# Patient Record
Sex: Male | Born: 1965 | Race: Black or African American | Hispanic: No | Smoking: Current every day smoker
Health system: Southern US, Community
[De-identification: ages and names within clinical notes are randomized; demographics above are authoritative.]

## PROBLEM LIST (undated history)

## (undated) DIAGNOSIS — I2510 Atherosclerotic heart disease of native coronary artery without angina pectoris: Secondary | ICD-10-CM

## (undated) DIAGNOSIS — E119 Type 2 diabetes mellitus without complications: Secondary | ICD-10-CM

## (undated) DIAGNOSIS — E7800 Pure hypercholesterolemia, unspecified: Secondary | ICD-10-CM

## (undated) DIAGNOSIS — K219 Gastro-esophageal reflux disease without esophagitis: Secondary | ICD-10-CM

## (undated) DIAGNOSIS — G4730 Sleep apnea, unspecified: Secondary | ICD-10-CM

## (undated) DIAGNOSIS — I219 Acute myocardial infarction, unspecified: Secondary | ICD-10-CM

## (undated) DIAGNOSIS — F1010 Alcohol abuse, uncomplicated: Secondary | ICD-10-CM

## (undated) DIAGNOSIS — I10 Essential (primary) hypertension: Secondary | ICD-10-CM

## (undated) DIAGNOSIS — S060XAA Concussion with loss of consciousness status unknown, initial encounter: Secondary | ICD-10-CM

## (undated) DIAGNOSIS — S060X9A Concussion with loss of consciousness of unspecified duration, initial encounter: Secondary | ICD-10-CM

## (undated) DIAGNOSIS — Z720 Tobacco use: Secondary | ICD-10-CM

## (undated) HISTORY — PX: KNEE ARTHROSCOPY: SHX127

## (undated) HISTORY — PX: OTHER SURGICAL HISTORY: SHX169

---

## 2009-01-21 ENCOUNTER — Emergency Department (HOSPITAL_BASED_OUTPATIENT_CLINIC_OR_DEPARTMENT_OTHER): Admission: EM | Admit: 2009-01-21 | Discharge: 2009-01-21 | Payer: Self-pay | Admitting: Emergency Medicine

## 2011-03-19 ENCOUNTER — Inpatient Hospital Stay (HOSPITAL_COMMUNITY)
Admission: EM | Admit: 2011-03-19 | Discharge: 2011-03-22 | DRG: 247 | Disposition: A | Discharge status: Home / Self Care | Payer: Self-pay | Attending: Cardiology | Admitting: Cardiology

## 2011-03-19 ENCOUNTER — Emergency Department (HOSPITAL_COMMUNITY): Payer: Self-pay

## 2011-03-19 DIAGNOSIS — I2510 Atherosclerotic heart disease of native coronary artery without angina pectoris: Secondary | ICD-10-CM | POA: Diagnosis present

## 2011-03-19 DIAGNOSIS — I10 Essential (primary) hypertension: Secondary | ICD-10-CM | POA: Diagnosis present

## 2011-03-19 DIAGNOSIS — E119 Type 2 diabetes mellitus without complications: Secondary | ICD-10-CM | POA: Diagnosis present

## 2011-03-19 DIAGNOSIS — Z7982 Long term (current) use of aspirin: Secondary | ICD-10-CM

## 2011-03-19 DIAGNOSIS — E7800 Pure hypercholesterolemia, unspecified: Secondary | ICD-10-CM | POA: Diagnosis present

## 2011-03-19 DIAGNOSIS — I2109 ST elevation (STEMI) myocardial infarction involving other coronary artery of anterior wall: Principal | ICD-10-CM | POA: Diagnosis present

## 2011-03-19 DIAGNOSIS — F17200 Nicotine dependence, unspecified, uncomplicated: Secondary | ICD-10-CM | POA: Diagnosis present

## 2011-03-19 LAB — DIFFERENTIAL
Basophils Relative: 0 % (ref 0–1)
Eosinophils Absolute: 0 10*3/uL (ref 0.0–0.7)
Eosinophils Relative: 1 % (ref 0–5)
Lymphs Abs: 1.5 10*3/uL (ref 0.7–4.0)
Monocytes Absolute: 0.6 10*3/uL (ref 0.1–1.0)
Monocytes Relative: 10 % (ref 3–12)
Neutrophils Relative %: 65 % (ref 43–77)

## 2011-03-19 LAB — TROPONIN I: Troponin I: 0.62 ng/mL (ref <0.30)

## 2011-03-19 LAB — COMPREHENSIVE METABOLIC PANEL
AST: 39 U/L — ABNORMAL HIGH (ref 0–37)
Albumin: 3.9 g/dL (ref 3.5–5.2)
BUN: 13 mg/dL (ref 6–23)
Calcium: 9.6 mg/dL (ref 8.4–10.5)
Chloride: 102 mEq/L (ref 96–112)
Creatinine, Ser: 0.99 mg/dL (ref 0.4–1.5)
GFR calc Af Amer: >60 mL/min (ref >60)
Total Bilirubin: 0.2 mg/dL — ABNORMAL LOW (ref 0.3–1.2)
Total Protein: 7.6 g/dL (ref 6.0–8.3)

## 2011-03-19 LAB — CBC
HCT: 41.2 % (ref 39.0–52.0)
MCH: 33.4 pg (ref 26.0–34.0)
MCHC: 35.6 g/dL (ref 30.0–36.0)
MCV: 93.4 fL (ref 78.0–100.0)
MCV: 93.8 fL (ref 78.0–100.0)
Platelets: 182 10*3/uL (ref 150–400)
RBC: 4.41 MIL/uL (ref 4.22–5.81)
RBC: 4.34 MIL/uL (ref 4.22–5.81)
RDW: 12.7 % (ref 11.5–15.5)
RDW: 12.5 % (ref 11.5–15.5)
WBC: 5.6 10*3/uL (ref 4.0–10.5)

## 2011-03-19 LAB — CARDIAC PANEL(CRET KIN+CKTOT+MB+TROPI)
CK, MB: 18.4 ng/mL (ref 0.3–4.0)
Relative Index: 3.8 — ABNORMAL HIGH (ref 0.0–2.5)
Relative Index: 4.8 — ABNORMAL HIGH (ref 0.0–2.5)
Total CK: 387 U/L — ABNORMAL HIGH (ref 7–232)
Troponin I: 2.21 ng/mL (ref <0.30)

## 2011-03-19 LAB — APTT: aPTT: 26 seconds (ref 24–37)

## 2011-03-19 LAB — HEPARIN LEVEL (UNFRACTIONATED)
Heparin Unfractionated: 0.25 IU/mL — ABNORMAL LOW (ref 0.30–0.70)
Heparin Unfractionated: 0.41 IU/mL (ref 0.30–0.70)

## 2011-03-19 LAB — PROTIME-INR
INR: 0.95 (ref 0.00–1.49)
Prothrombin Time: 12.9 seconds (ref 11.6–15.2)

## 2011-03-19 LAB — CK TOTAL AND CKMB (NOT AT ARMC): Relative Index: 1.7 (ref 0.0–2.5)

## 2011-03-20 LAB — LIPID PANEL
Cholesterol: 165 mg/dL (ref 0–200)
LDL Cholesterol: 103 mg/dL — ABNORMAL HIGH (ref 0–99)

## 2011-03-20 LAB — CARDIAC PANEL(CRET KIN+CKTOT+MB+TROPI)
CK, MB: 27.8 ng/mL (ref 0.3–4.0)
Relative Index: 8.9 — ABNORMAL HIGH (ref 0.0–2.5)
Total CK: 314 U/L — ABNORMAL HIGH (ref 7–232)
Troponin I: 3.64 ng/mL (ref <0.30)

## 2011-03-20 LAB — CBC
MCH: 32.3 pg (ref 26.0–34.0)
MCV: 94.3 fL (ref 78.0–100.0)
Platelets: 159 10*3/uL (ref 150–400)
RDW: 12.6 % (ref 11.5–15.5)
WBC: 6.7 10*3/uL (ref 4.0–10.5)

## 2011-03-20 LAB — BASIC METABOLIC PANEL
BUN: 10 mg/dL (ref 6–23)
Creatinine, Ser: 0.82 mg/dL (ref 0.4–1.5)
GFR calc Af Amer: >60 mL/min (ref >60)
GFR calc non Af Amer: >60 mL/min (ref >60)

## 2011-03-20 LAB — POCT ACTIVATED CLOTTING TIME: Activated Clotting Time: 329 seconds

## 2011-03-21 LAB — GLUCOSE, CAPILLARY
Glucose-Capillary: 125 mg/dL — ABNORMAL HIGH (ref 70–99)
Glucose-Capillary: 118 mg/dL — ABNORMAL HIGH (ref 70–99)

## 2011-03-21 LAB — BASIC METABOLIC PANEL
BUN: 10 mg/dL (ref 6–23)
Chloride: 102 mEq/L (ref 96–112)
Creatinine, Ser: 0.87 mg/dL (ref 0.4–1.5)
Glucose, Bld: 114 mg/dL — ABNORMAL HIGH (ref 70–99)
Potassium: 3.7 mEq/L (ref 3.5–5.1)

## 2011-03-21 LAB — CBC
MCHC: 34.7 g/dL (ref 30.0–36.0)
Platelets: 160 10*3/uL (ref 150–400)
RDW: 12.3 % (ref 11.5–15.5)

## 2011-03-21 LAB — CARDIAC PANEL(CRET KIN+CKTOT+MB+TROPI)
CK, MB: 34.7 ng/mL (ref 0.3–4.0)
Relative Index: 7.6 — ABNORMAL HIGH (ref 0.0–2.5)
Troponin I: 8.45 ng/mL (ref <0.30)

## 2011-03-21 LAB — PLATELET INHIBITION P2Y12
P2Y12 % Inhibition: 76 %
Platelet Function  P2Y12: 66 [PRU] — ABNORMAL LOW (ref 194–418)

## 2011-03-21 LAB — HEMOGLOBIN A1C: Mean Plasma Glucose: 146 mg/dL — ABNORMAL HIGH (ref <117)

## 2011-03-22 LAB — CARDIAC PANEL(CRET KIN+CKTOT+MB+TROPI)
CK, MB: 10.4 ng/mL (ref 0.3–4.0)
Relative Index: 3.8 — ABNORMAL HIGH (ref 0.0–2.5)
Total CK: 275 U/L — ABNORMAL HIGH (ref 7–232)
Troponin I: 4.42 ng/mL (ref <0.30)

## 2011-03-22 LAB — BASIC METABOLIC PANEL
Calcium: 9.1 mg/dL (ref 8.4–10.5)
Creatinine, Ser: 0.85 mg/dL (ref 0.4–1.5)
GFR calc Af Amer: >60 mL/min (ref >60)
GFR calc non Af Amer: >60 mL/min (ref >60)
Sodium: 139 mEq/L (ref 135–145)

## 2011-03-22 LAB — GLUCOSE, CAPILLARY: Glucose-Capillary: 120 mg/dL — ABNORMAL HIGH (ref 70–99)

## 2011-03-22 LAB — CBC
HCT: 34.7 % — ABNORMAL LOW (ref 39.0–52.0)
Hemoglobin: 12.1 g/dL — ABNORMAL LOW (ref 13.0–17.0)
RBC: 3.69 MIL/uL — ABNORMAL LOW (ref 4.22–5.81)

## 2011-03-24 LAB — GLUCOSE, CAPILLARY: Glucose-Capillary: 138 mg/dL — ABNORMAL HIGH (ref 70–99)

## 2011-04-10 NOTE — Discharge Summary (Signed)
[name], [name]               ACCOUNT NO.:  [id number]  MEDICAL RECORD NO.:  [id number]           PATIENT TYPE:  I  LOCATION:  2926                         FACILITY:  [facility]  PHYSICIAN:  Eduardo Osier. Sharyn Lull, M.D. DATE OF BIRTH:  1966-09-03  DATE OF ADMISSION:  03/19/2011 DATE OF DISCHARGE:  03/22/2011                              DISCHARGE SUMMARY   ADMITTING DIAGNOSES: 1. Probable recent anteroseptal wall myocardial infarction. 2. Postinfarction angina. 3. Uncontrolled hypertension. 4. Tobacco abuse.  DISCHARGE DIAGNOSES: 1. Status post recent anteroseptal wall myocardial infarction. 2. Multivessel coronary artery disease status post left cath and PTCA     stenting to LAD, diagonal to ramus and obtuse marginal 3. 3. Hypertension. 4. Glucose intolerance. 5. Hypercholesteremia. 6. Tobacco abuse. 7. History of migraine headache in the past.  DISCHARGE HOME MEDICATIONS: 1. Enteric-coated aspirin 325 mg 1 tablet daily. 2. Carvedilol 25 mg 1 tablet twice daily. 3. Effient 10 mg tablet daily. 4. Pepcid 20 mg 1 tablet twice daily. 5. Isosorbide mononitrate 60 mg 1 tablet daily. 6. Lisinopril 20 mg 1 tablet twice daily. 7. Crestor 40 mg 1 tablet daily. 8. Nitrostat 0.4 mg sublingual q.5 minutes as needed.  DIET:  Low-salt, low-cholesterol 1800-calories ADA diet.  Post PTCA stent instructions have been given.  Follow up with me in 1 week.  CONDITION AT DISCHARGE:  Stable.  BRIEF HISTORY AND HOSPITAL COURSE:  Mr. [name] is a 45 year old black male with past medical history significant for hypertension, tobacco abuse.  He came to the ER complaining of recurrent retrosternal chest pain off and on for last 2 weeks.  He states that felt like indigestion, relieved with belching.  Chest pain occasionally radiated to both arms and for last 2 days, chest pain was associated with diaphoresis lasting 20-30 minutes.  Denies any palpitation, lightheadedness, or syncope. Denies  history of exertional chest pain.  Denies any drug abuse.  States chest pain happens almost every night.  EKG done in the ER showed Q- waves in V1 to V3 with ST elevation and T-wave inversion in anterolateral leads suggestive of evolving anteroseptal wall MI.  The patient presently denies any chest pain, nausea, vomiting, or diaphoresis.  PAST MEDICAL HISTORY:  As above.  PAST SURGICAL HISTORY:  He had restoration of tongue in the past.  ALLERGIES:  No known drug allergies.  MEDICATION AT HOME:  He was on Cardizem, hydrochlorothiazide, and lisinopril.  SOCIAL HISTORY:  He is single, one child.  Smokes 1 pack per week. Drinks occasionally socially.  FAMILY HISTORY:  Father is alive, he is diabetic.  Mother was murdered at the age of 46.  PHYSICAL EXAMINATION:  GENERAL:  He is alert and oriented x3 in no acute distress. VITAL SIGNS:  Blood pressure was 158/101, pulse was 81 and regular. HEENT:  Conjunctivae was pink. NECK:  Supple, no JVD, no bruit. LUNGS:  Clear to auscultation without rhonchi or rales. CARDIOVASCULAR:  S1 and S2 was normal.  There was soft systolic murmur and S4 gallop. ABDOMEN:  Soft.  Bowel sounds were present, nontender. EXTREMITIES:  There is no clubbing, cyanosis, or edema.  LABORATORY DATA:  Hemoglobin was 14.4, hematocrit 41.2, white count of 5.6.  Sodium was 139, potassium 3.6, BUN 13, creatinine 0.99, glucose was 204.  Repeat fasting blood sugar was 121.  His first set of troponin I was elevated at 0.62, CK was 496, MB 8.6, relative index 1.7; next set CK was 426, MB 16.3, relative index 3.8, troponin I was 2.21; next set CK was 387, MB 18.4, relative index 4.8, troponin I 3.75.  On May 31, CK was 314, MB 27.8, relative index 8.9, troponin I was 3.64; next CK was 458, MB 34.7, relative index 7.6, troponin I 8.45.  Today's CK is 275, MB 10.4, relative index 3.8, troponin I is 4.42, which is trending down. Today's CBC is 12.1, hematocrit 34.7 white  count of 7.0, platelet count is 161,000.  Sodium is 139, potassium 4.0 fasting sugar is 114, BUN 12, creatinine 0.85.  Hemoglobin A1c was 6.7.  His cholesterol was 165, triglycerides 131, HDL 36, LDL 103.  His P2Y12 platelet inhibition was 76%, ERU level was 66.  BRIEF HOSPITAL COURSE:  The patient was admitted to CCU.  The patient ruled in for anteroseptal wall recent MI.  The patient subsequently underwent left cardiac cath with selective left and right coronary angiography as per procedure report and PTCA stenting as per procedure report.  The patient tolerated procedure well.  Postprocedure, the patient did not have any episodes of chest pain during the hospital stay.  His groin is stable.  The patient did have slight bump in his cardiac enzymes postprocedure, probably felt due to distal embolization of small plaque into apical LAD.  The patient remained asymptomatic, did not have any episodes of chest pain during the hospital stay. His groin is stable with no evidence of hematoma or bruit.  Phase I cardiac rehab was called.  The patient has been ambulating in hallway without any problems.  The patient will be discharged home on above medications and will be followed up in my office in 1 week.  The patient will be scheduled for phase II cardiac rehab as outpatient.     Eduardo Osier. Sharyn Lull, M.D.     MNH/MEDQ  D:  03/22/2011  T:  03/23/2011  Job:  [id number]  Electronically Signed by Rinaldo Cloud M.D. on 04/10/2011 10:03:24 AM

## 2011-04-10 NOTE — Cardiovascular Report (Signed)
NAMEKEVEN, [name]               ACCOUNT NO.:  [id number]  MEDICAL RECORD NO.:  [id number]           PATIENT TYPE:  I  LOCATION:  2926                         FACILITY:  [facility]  PHYSICIAN:  Eduardo Osier. Sharyn Lull, M.D. DATE OF BIRTH:  06-25-1966  DATE OF PROCEDURE:  03/20/2011 DATE OF DISCHARGE:                           CARDIAC CATHETERIZATION   PROCEDURES: 1. Left cardiac cath with selective left and right coronary     angiography, LV graft via right groin using Judkins technique. 2. Successful PTCA to small diagonal 2 using 2.0 x 12 mm long mini     Trek balloon. 3. Successful PTCA to mid LAD using 2.5 x 15 mm long mini Trek     balloon. 4. Successful deployment of 2.75 x 26 mm Resolute integrity drug-     eluting stent in mid LAD. 5. Successful postdilatation of the stent using 3.0 x 20 mm long [place]     Trek balloon. 6. Successful postdilatation of proximal 1/4th of the stent using 3.5     x 8 mm long [place] Trek balloon. 7. Successful PTCA to mid ramus using 2.4 x 12 mm long mini Trek     balloon. 8. Successful deployment of 2.5 x 18 mm long Resolute integrity drug-     eluting stent in mid ramus. 9. Successful postdilatation of the stent using 2.5 x 15 mm long [place]     Trek balloon. 10.Successful PTCA to OM-3 using 2.5 x 12 mm long mini Trek balloon. 11.Successful deployment of 2.5 x 26 mm long Resolute integrity drug-     eluting stent in proximal and mid OM-3. 12.Successful postdilatation of the stent using 2.75 x 20 mm long [place]     Trek balloon.  INDICATIONS FOR PROCEDURE:  [name] is a 45 year old black male with past medical history significant for hypertension, tobacco abuse.  He came to the ER, complaining of recurrent retrosternal chest pain off and on for the last 2 weeks.  The patient initially thought it is due to acid reflux, relieved with belching.  Chest pain occasionally radiated to both arms and back for the last 2 days.  Chest pain was associated with  diaphoresis, lasting 20-30 minutes.  He denies any palpitation, lightheadedness, or syncope.  Denies exertional chest pain.  Denies any drug abuse.  States chest pain happens almost every night.  EKG done in the ER showed Q wave in V1-V3 with ST elevation and T-wave inversion in anterolateral leads, suggestive of evolving anteroseptal wall MI.  The patient presently denies any chest pain, nausea, vomiting, diaphoresis. Due to typical anginal chest pain, EKG changes, and elevated cardiac enzymes, discussed with the patient regarding left cath, possible PTCA stenting, its risks and benefits i.e., death, MI, stroke, need for emergency CABG, local vascular complications, etc., and consented for the procedure.  DESCRIPTION OF PROCEDURE:  After obtaining the informed consent, the patient was brought to the cath lab and was placed on fluoroscopy table. Right groin was prepped and draped in usual fashion.  A 1% Xylocaine was used for local anesthesia in the right groin with the help of thin-wall needle.  A 6-French arterial sheath was placed.  The sheath was aspirated and flushed.  Next, a 6-French left Judkins catheter was advanced over the wire under fluoroscopic guidance up to the ascending aorta.  Wire was pulled out.  The catheter was aspirated and connected to the manifold.  Catheter was further advanced and engaged into left coronary ostium.  Multiple views of the left system were taken.  Next, the catheter was disengaged and was pulled out over the wire and was replaced with 6-French right Judkins catheter which was advanced over the wire under fluoroscopic guidance up to the ascending aorta.  Wire was pulled out.  The catheter was aspirated and connected to the manifold.  Catheter was further advanced and engaged into right coronary ostium.  Multiple views of the right system were taken.  Next, the catheter was disengaged and was pulled out over the wire and was replaced with 6-French  pigtail catheter which was advanced over the wire under fluoroscopic guidance up to the ascending aorta.  Wire was pulled out.  The catheter was aspirated and connected to the manifold. Catheter was further advanced across the aortic valve into the LV.  LV pressures were recorded.  Next, LV graft was done in 30-degree RAO position.  Post-angiographic pressures were recorded from LV and then pullback pressures were recorded from aorta.  There was no significant gradient across the aortic valve.  Next, the pigtail catheter was pulled out over the wire.  Sheaths were aspirated and flushed.  FINDINGS:  LV showed anterolateral wall hypokinesia with apical and inferoapical wall dyskinesia, EF of 45-50%.  Left main was patent.  LAD has 40-50% ostial and 20-30% proximal and 90-95% mid and 99% apical stenosis.  Distally, the vessel is less than 1.5 mm.  Diagonal 1 is very, very small.  Diagonal 2 is very small which has 85-90% proximal stenosis.  Diagonal 3 is very small which is patent.  Ramus has 30-40% ostial and 85-90% mid stenosis.  Left circumflex has 30-40% ostial stenosis.  OM-1 and OM-2 were very, very small.  OM-3 has 70-90% sequential proximal and mid stenosis.  Distally, left circ has 50-60% sequential stenosis.  Left circ is very ectatic vessel in proximal and midportion.  RCA is nondominant small which has distal diffuse disease in PLV branch.  INTERVENTIONAL PROCEDURE:  Successful PTCA to diagonal 2 was done using 2.0 x 12 mm long mini Trek balloon, going up to 8 atmospheric pressure. Lesion was dilated from 85-90% to less than 30% residual with excellent TIMI grade 3 distal flow without evidence of dissection or distal embolization.  Multiple inflations were done, but due to elastic recoil, there was 30% residual stenosis.  Vessel is very small and not suitable for stenting.  Next, successful PTCA to mid LAD was done using 2.5 x 15 mm long mini Trek balloon going up for  predilatation and then 2.75 x 26 mm long Resolute integrity drug-eluting stent was deployed in mid LAD at 11 atmospheric pressure.  Stent was postdilated using 3.0 x 20 mm long [place] Trek balloon, going up to 18 atmospheric pressure.  Proximal 1/4th of the stent was postdilated using 3.5 x 8 mm long [place] Trek balloon.  Lesion was dilated from 90 to 95% to 0% residual with excellent TIMI grade 3 distal flow without evidence of dissection or distal embolization.  PTCA to mid ramus was done using 2.0 x 12 mm long mini Trek balloon for predilatation and then 2.5 x 18 mm long Resolute integrity  drug-eluting stent was deployed in mid ramus at 10 atmospheric pressure.  Stent was postdilated using 2.5 x 15 mm long [place] Trek balloon, going up to 18 atmospheric pressure.  Lesion was dilated from 85-90% to 0% residual with excellent TIMI grade 3 distal flow without evidence of dissection or distal embolization.  Successful PTCA to OM-3 was done using 2.5 x 12 mm long mini Trek balloon for predilatation and then 2.5 x 26 mm long Resolute integrity drug-eluting stent was deployed at 12 atmospheric pressure.  Stent was postdilated using 2.75 x 20 mm long [place] Trek balloon, going up to 12 atmospheric pressure.  Lesions were dilated from 70-90% to 0% residual with excellent TIMI grade 3 distal flow without evidence of dissection or distal embolization.  The patient received weight-based Angiomax and 60 mg of Effient prior to the procedure.  The patient tolerated the procedure well.  There were no complications.  The patient was transferred to recovery room in stable condition.     Eduardo Osier. Sharyn Lull, M.D.     MNH/MEDQ  D:  03/20/2011  T:  03/21/2011  Job:  [id number]  Electronically Signed by Rinaldo Cloud M.D. on 04/10/2011 10:03:19 AM

## 2013-01-04 ENCOUNTER — Encounter (HOSPITAL_COMMUNITY): Payer: Self-pay | Admitting: *Deleted

## 2013-01-04 ENCOUNTER — Inpatient Hospital Stay (HOSPITAL_COMMUNITY)
Admission: EM | Admit: 2013-01-04 | Discharge: 2013-01-06 | DRG: 440 | Disposition: A | Discharge status: Home / Self Care | Payer: Non-veteran care | Attending: Internal Medicine | Admitting: Internal Medicine

## 2013-01-04 DIAGNOSIS — I2510 Atherosclerotic heart disease of native coronary artery without angina pectoris: Secondary | ICD-10-CM | POA: Diagnosis present

## 2013-01-04 DIAGNOSIS — K859 Acute pancreatitis without necrosis or infection, unspecified: Principal | ICD-10-CM | POA: Diagnosis present

## 2013-01-04 DIAGNOSIS — I10 Essential (primary) hypertension: Secondary | ICD-10-CM | POA: Diagnosis present

## 2013-01-04 DIAGNOSIS — R739 Hyperglycemia, unspecified: Secondary | ICD-10-CM | POA: Diagnosis present

## 2013-01-04 DIAGNOSIS — R7309 Other abnormal glucose: Secondary | ICD-10-CM | POA: Diagnosis present

## 2013-01-04 DIAGNOSIS — E7800 Pure hypercholesterolemia, unspecified: Secondary | ICD-10-CM | POA: Diagnosis present

## 2013-01-04 DIAGNOSIS — Z79899 Other long term (current) drug therapy: Secondary | ICD-10-CM

## 2013-01-04 DIAGNOSIS — K219 Gastro-esophageal reflux disease without esophagitis: Secondary | ICD-10-CM | POA: Diagnosis present

## 2013-01-04 DIAGNOSIS — I252 Old myocardial infarction: Secondary | ICD-10-CM

## 2013-01-04 DIAGNOSIS — R7989 Other specified abnormal findings of blood chemistry: Secondary | ICD-10-CM | POA: Diagnosis present

## 2013-01-04 HISTORY — DX: Pure hypercholesterolemia, unspecified: E78.00

## 2013-01-04 HISTORY — DX: Essential (primary) hypertension: I10

## 2013-01-04 HISTORY — DX: Gastro-esophageal reflux disease without esophagitis: K21.9

## 2013-01-04 HISTORY — DX: Acute myocardial infarction, unspecified: I21.9

## 2013-01-04 LAB — COMPREHENSIVE METABOLIC PANEL
ALT: 94 U/L — ABNORMAL HIGH (ref 0–53)
AST: 169 U/L — ABNORMAL HIGH (ref 0–37)
Albumin: 3.7 g/dL (ref 3.5–5.2)
Chloride: 100 mEq/L (ref 96–112)
Creatinine, Ser: 0.92 mg/dL (ref 0.50–1.35)
Sodium: 134 mEq/L — ABNORMAL LOW (ref 135–145)
Total Bilirubin: 0.4 mg/dL (ref 0.3–1.2)

## 2013-01-04 LAB — CBC WITH DIFFERENTIAL/PLATELET
Basophils Absolute: 0 10*3/uL (ref 0.0–0.1)
Basophils Relative: 0 % (ref 0–1)
HCT: 38.9 % — ABNORMAL LOW (ref 39.0–52.0)
MCHC: 35.7 g/dL (ref 30.0–36.0)
Monocytes Absolute: 0.4 10*3/uL (ref 0.1–1.0)
Neutro Abs: 3.2 10*3/uL (ref 1.7–7.7)
Neutrophils Relative %: 59 % (ref 43–77)
Platelets: 218 10*3/uL (ref 150–400)
RDW: 13.4 % (ref 11.5–15.5)
WBC: 5.5 10*3/uL (ref 4.0–10.5)

## 2013-01-04 MED ORDER — ONDANSETRON HCL 4 MG/2ML IJ SOLN
4.0000 mg | Freq: Once | INTRAMUSCULAR | Status: AC
  Administered 2013-01-04: 4 mg via INTRAVENOUS
  Filled 2013-01-04: qty 2

## 2013-01-04 MED ORDER — MORPHINE SULFATE 4 MG/ML IJ SOLN
4.0000 mg | Freq: Once | INTRAMUSCULAR | Status: AC
  Administered 2013-01-04: 4 mg via INTRAVENOUS
  Filled 2013-01-04: qty 1

## 2013-01-04 MED ORDER — IOHEXOL 300 MG/ML  SOLN
20.0000 mL | INTRAMUSCULAR | Status: AC
  Administered 2013-01-04: 50 mL via ORAL

## 2013-01-04 NOTE — ED Notes (Signed)
Pt states that he is having pain in his abdomen that goes to his back. Pt states that burping helps with the pain. Pt states diarrhea for the past couple of days as well. Pt denies problems with urine. Pt states some vomiting in the mornings.

## 2013-01-05 ENCOUNTER — Emergency Department (HOSPITAL_COMMUNITY): Payer: Self-pay

## 2013-01-05 ENCOUNTER — Inpatient Hospital Stay (HOSPITAL_COMMUNITY): Payer: Self-pay

## 2013-01-05 ENCOUNTER — Encounter (HOSPITAL_COMMUNITY): Payer: Self-pay | Admitting: Radiology

## 2013-01-05 DIAGNOSIS — I10 Essential (primary) hypertension: Secondary | ICD-10-CM | POA: Diagnosis present

## 2013-01-05 DIAGNOSIS — K859 Acute pancreatitis without necrosis or infection, unspecified: Secondary | ICD-10-CM | POA: Diagnosis present

## 2013-01-05 DIAGNOSIS — R739 Hyperglycemia, unspecified: Secondary | ICD-10-CM | POA: Diagnosis present

## 2013-01-05 DIAGNOSIS — R7989 Other specified abnormal findings of blood chemistry: Secondary | ICD-10-CM

## 2013-01-05 DIAGNOSIS — R7309 Other abnormal glucose: Secondary | ICD-10-CM

## 2013-01-05 DIAGNOSIS — I2510 Atherosclerotic heart disease of native coronary artery without angina pectoris: Secondary | ICD-10-CM

## 2013-01-05 LAB — CBC
MCHC: 35.1 g/dL (ref 30.0–36.0)
MCV: 91.3 fL (ref 78.0–100.0)
Platelets: 200 10*3/uL (ref 150–400)
RDW: 13.3 % (ref 11.5–15.5)
WBC: 8.5 10*3/uL (ref 4.0–10.5)

## 2013-01-05 LAB — URINALYSIS, ROUTINE W REFLEX MICROSCOPIC
Glucose, UA: 250 mg/dL — AB
Ketones, ur: 15 mg/dL — AB
Leukocytes, UA: NEGATIVE
pH: 5 (ref 5.0–8.0)

## 2013-01-05 LAB — HEPATITIS PANEL, ACUTE
HCV Ab: NEGATIVE
Hep A IgM: NEGATIVE
Hep B C IgM: NEGATIVE

## 2013-01-05 LAB — GLUCOSE, CAPILLARY
Glucose-Capillary: 147 mg/dL — ABNORMAL HIGH (ref 70–99)
Glucose-Capillary: 151 mg/dL — ABNORMAL HIGH (ref 70–99)
Glucose-Capillary: 162 mg/dL — ABNORMAL HIGH (ref 70–99)

## 2013-01-05 LAB — COMPREHENSIVE METABOLIC PANEL
ALT: 82 U/L — ABNORMAL HIGH (ref 0–53)
Alkaline Phosphatase: 49 U/L (ref 39–117)
BUN: 14 mg/dL (ref 6–23)
Chloride: 100 mEq/L (ref 96–112)
GFR calc Af Amer: >90 mL/min (ref >90)
Glucose, Bld: 147 mg/dL — ABNORMAL HIGH (ref 70–99)
Potassium: 3.7 mEq/L (ref 3.5–5.1)
Sodium: 133 mEq/L — ABNORMAL LOW (ref 135–145)
Total Bilirubin: 0.8 mg/dL (ref 0.3–1.2)

## 2013-01-05 LAB — LIPID PANEL
HDL: 35 mg/dL — ABNORMAL LOW (ref >39)
LDL Cholesterol: 19 mg/dL (ref 0–99)
Triglycerides: 125 mg/dL (ref <150)

## 2013-01-05 LAB — MAGNESIUM: Magnesium: 2 mg/dL (ref 1.5–2.5)

## 2013-01-05 LAB — HEMOGLOBIN A1C: Mean Plasma Glucose: 146 mg/dL — ABNORMAL HIGH (ref <117)

## 2013-01-05 MED ORDER — ONDANSETRON HCL 4 MG PO TABS: 4.0000 mg | ORAL_TABLET | Freq: Four times a day (QID) | ORAL | Status: DC | PRN

## 2013-01-05 MED ORDER — SODIUM CHLORIDE 0.9 % IV SOLN
INTRAVENOUS | Status: AC
  Administered 2013-01-05 (×2): via INTRAVENOUS

## 2013-01-05 MED ORDER — ACETAMINOPHEN 650 MG RE SUPP: 650.0000 mg | Freq: Four times a day (QID) | RECTAL | Status: DC | PRN

## 2013-01-05 MED ORDER — ACETAMINOPHEN 325 MG PO TABS: 650.0000 mg | ORAL_TABLET | Freq: Four times a day (QID) | ORAL | Status: DC | PRN

## 2013-01-05 MED ORDER — ONDANSETRON HCL 4 MG/2ML IJ SOLN: 4.0000 mg | Freq: Four times a day (QID) | INTRAMUSCULAR | Status: DC | PRN

## 2013-01-05 MED ORDER — HYDROMORPHONE HCL PF 1 MG/ML IJ SOLN
0.5000 mg | INTRAMUSCULAR | Status: AC
  Administered 2013-01-05: 0.5 mg via INTRAVENOUS

## 2013-01-05 MED ORDER — ATORVASTATIN CALCIUM 40 MG PO TABS
40.0000 mg | ORAL_TABLET | Freq: Every day | ORAL | Status: DC
  Filled 2013-01-05 (×2): qty 1

## 2013-01-05 MED ORDER — CARVEDILOL 25 MG PO TABS
25.0000 mg | ORAL_TABLET | Freq: Two times a day (BID) | ORAL | Status: DC
  Administered 2013-01-05 – 2013-01-06 (×2): 25 mg via ORAL
  Filled 2013-01-05 (×5): qty 1

## 2013-01-05 MED ORDER — PANTOPRAZOLE SODIUM 40 MG PO TBEC
40.0000 mg | DELAYED_RELEASE_TABLET | Freq: Every day | ORAL | Status: DC
  Administered 2013-01-05 – 2013-01-06 (×2): 40 mg via ORAL
  Filled 2013-01-05 (×2): qty 1

## 2013-01-05 MED ORDER — IOHEXOL 300 MG/ML  SOLN
100.0000 mL | Freq: Once | INTRAMUSCULAR | Status: AC | PRN
  Administered 2013-01-05: 100 mL via INTRAVENOUS

## 2013-01-05 MED ORDER — ENOXAPARIN SODIUM 40 MG/0.4ML ~~LOC~~ SOLN
40.0000 mg | SUBCUTANEOUS | Status: DC
  Filled 2013-01-05 (×2): qty 0.4

## 2013-01-05 MED ORDER — HYDROCODONE-ACETAMINOPHEN 5-325 MG PO TABS: 1.0000 | ORAL_TABLET | ORAL | Status: DC | PRN

## 2013-01-05 MED ORDER — HYDROMORPHONE HCL PF 1 MG/ML IJ SOLN
1.0000 mg | INTRAMUSCULAR | Status: DC
  Filled 2013-01-05: qty 1

## 2013-01-05 MED ORDER — HYDROMORPHONE HCL PF 1 MG/ML IJ SOLN: 0.5000 mg | INTRAMUSCULAR | Status: DC | PRN

## 2013-01-05 MED ORDER — PRASUGREL HCL 10 MG PO TABS
10.0000 mg | ORAL_TABLET | Freq: Every day | ORAL | Status: DC
Start: 2013-01-05 — End: 2013-01-05
  Administered 2013-01-05: 10 mg via ORAL
  Filled 2013-01-05: qty 1

## 2013-01-05 NOTE — Progress Notes (Signed)
Patient arrived from the ED VIA stretcher and placed into bed 5504. Patient was oriented to the unit and call bell was placed within reach. IV fluids was started. Patient has no skin issues. Patient is alert oriented and ambulatory. Will continue to monitor.  Marcelyn Bruins RN BSN

## 2013-01-05 NOTE — Progress Notes (Signed)
Utilization review completed. Whitley Patchen, RN, BSN. 

## 2013-01-05 NOTE — ED Notes (Signed)
Patient transported to CT 

## 2013-01-05 NOTE — ED Provider Notes (Signed)
History     CSN: [id number]  Arrival date & time 01/04/13  2218   First MD Initiated Contact with Patient 01/04/13 2301      Chief Complaint  Patient presents with  . Abdominal Pain    (Consider location/radiation/quality/duration/timing/severity/associated sxs/prior treatment) HPI 47 year old male presents to emergency room with complaint of several days of diffuse abdominal pain associated with nausea, vomiting, and diarrhea.  Today, the pain does, worse.  He reports sharp, stabbing pains on bilateral sides of his abdomen rapidly around to his back.  Patient has history of MI, hypertension.  He, reports he's been taking his medications.  He reports he smokes 3-5 cigarettes a day.  He reports no prior abdominal surgeries.  He denies any alcohol to excess.  No prior history of pancreatitis, colitis.  No sick contacts.  He reports stool is mucous-like in consistency. Past Medical History  Diagnosis Date  . MI (myocardial infarction)     2012  . Hypertension   . High cholesterol   . Acid reflux     Past Surgical History  Procedure Laterality Date  . Stents      History reviewed. No pertinent family history.  History  Substance Use Topics  . Smoking status: Never Smoker   . Smokeless tobacco: Not on file  . Alcohol Use: Yes     Comment: occassionally       Review of Systems  All other systems reviewed and are negative.    Allergies  Review of patient's allergies indicates no known allergies.  Home Medications   Current Outpatient Rx  Name  Route  Sig  Dispense  Refill  . carvedilol (COREG) 25 MG tablet   Oral   Take 25 mg by mouth 2 (two) times daily with a meal.         . hydrochlorothiazide (HYDRODIURIL) 25 MG tablet   Oral   Take 25 mg by mouth daily.         Marland Kitchen lisinopril (PRINIVIL,ZESTRIL) 40 MG tablet   Oral   Take 40 mg by mouth daily.         Marland Kitchen omeprazole (PRILOSEC) 20 MG capsule   Oral   Take 20 mg by mouth daily.         . prasugrel  (EFFIENT) 10 MG TABS   Oral   Take 10 mg by mouth daily.         . simvastatin (ZOCOR) 80 MG tablet   Oral   Take 80 mg by mouth at bedtime.           BP 186/117  Pulse 76  Temp(Src) 98.6 F (37 C) (Oral)  Resp 18  Ht 5\' 11"  (1.803 m)  Wt 180 lb (81.647 kg)  BMI 25.12 kg/m2  SpO2 99%  Physical Exam  Nursing note and vitals reviewed. Constitutional: He is oriented to person, place, and time. He appears well-developed and well-nourished.  HENT:  Head: Normocephalic and atraumatic.  Nose: Nose normal.  Mouth/Throat: Oropharynx is clear and moist.  Eyes: Conjunctivae and EOM are normal. Pupils are equal, round, and reactive to light.  Neck: Normal range of motion. Neck supple. No JVD present. No tracheal deviation present. No thyromegaly present.  Cardiovascular: Normal rate, regular rhythm, normal heart sounds and intact distal pulses.  Exam reveals no gallop and no friction rub.   No murmur heard. Pulmonary/Chest: Effort normal and breath sounds normal. No stridor. No respiratory distress. He has no wheezes. He has no rales. He exhibits  no tenderness.  Abdominal: Soft. Bowel sounds are normal. He exhibits no distension and no mass. There is tenderness (tenderness to palpation in right and left upper quadrants, worse in the left.  No true Murphy sign elicited.). There is no rebound and no guarding.  Musculoskeletal: Normal range of motion. He exhibits no edema and no tenderness.  Lymphadenopathy:    He has no cervical adenopathy.  Neurological: He is alert and oriented to person, place, and time. He exhibits normal muscle tone. Coordination normal.  Skin: Skin is warm and dry. No rash noted. No erythema. No pallor.  Psychiatric: He has a normal mood and affect. His behavior is normal. Judgment and thought content normal.    ED Course  Procedures (including critical care time)  Labs Reviewed  CBC WITH DIFFERENTIAL - Abnormal; Notable for the following:    HCT 38.9 (*)     All other components within normal limits  COMPREHENSIVE METABOLIC PANEL - Abnormal; Notable for the following:    Sodium 134 (*)    Glucose, Bld 208 (*)    AST 169 (*)    ALT 94 (*)    All other components within normal limits  LIPASE, BLOOD - Abnormal; Notable for the following:    Lipase 659 (*)    All other components within normal limits  URINALYSIS, ROUTINE W REFLEX MICROSCOPIC - Abnormal; Notable for the following:    Glucose, UA 250 (*)    Ketones, ur 15 (*)    All other components within normal limits   Ct Abdomen Pelvis W Contrast  01/05/2013  *RADIOLOGY REPORT*  Clinical Data: Upper abdominal pain.  Elevated liver function tests and elevated serum lipase.  CT ABDOMEN AND PELVIS WITH CONTRAST  Technique:  Multidetector CT imaging of the abdomen and pelvis was performed following the standard protocol during bolus administration of intravenous contrast.  Contrast: OMNIPAQUE IOHEXOL 300 MG/ML. Oral contrast was also administered.  Comparison: None.  Findings: Mild edema/inflammation in the fat surrounding the head of the pancreas.  Remainder of the pancreas normal in appearance. Entire pancreas enhances normally.  No associated ascites.  Diffuse hepatic steatosis without focal hepatic parenchymal abnormality.  Borderline to mild hepatomegaly.  Spleen normal in size and appearance, with a small focus of accessory splenic tissue anterior to the lower pole.  Normal adrenal glands.  Small cortical cysts involving both kidneys; no significant abnormalities involving either kidney.  Layering sludge versus tiny gallstones in the gallbladder; no CT evidence for acute cholecystitis.  No biliary ductal dilation.  Mild aorto-iliac atherosclerosis.  No significant lymphadenopathy.  Normal appearing stomach, small bowel, and colon.  Normal appendix in the right upper pelvis.  No ascites.  Prostate gland seminal vesicles normal for age.  Urinary bladder unremarkable.  Phleboliths low in the  pelvis.  Bone window images unremarkable.  Visualized lung bases clear apart from the expected dependent atelectasis posteriorly in the lower lobes.  IMPRESSION:  1.  Acute pancreatitis involving the head of the pancreatitis.  No complicating features. 2.  Borderline to mild hepatomegaly with diffuse hepatic steatosis. No focal hepatic parenchymal abnormalities. 3.  Layering sludge versus tiny gallstones in the gallbladder without evidence of acute cholecystitis.   Original Report Authenticated By: Hulan Saas, M.D.      No diagnosis found.    MDM  47 year old male with several days of abdominal pain, nausea, vomiting, and diarrhea.  Patient noted to have elevation in AST, ALT, and lipase.  Given ratio of AST, ALT, suspect  probable alcohol pancreatitis.  We'll get CT scan.  Do not feel symptoms are due to cholelithiasis or cholecystitis.  Given minimal tenderness in right upper quadrant versus epigastric and left upper quadrant      Olivia Mackie, MD 01/05/13 [phone]

## 2013-01-05 NOTE — Progress Notes (Signed)
PATIENT DETAILS Name: [name] Age: 47 y.o. Sex: male Date of Birth: 09/30/1966 Admit Date: 01/04/2013 Admitting Physician Therisa Doyne, MD ZOX:[id number], MD  Subjective: Admitted with abdominal pain-found to have pancreatitis  Assessment/Plan: Active Problems:   Pancreatitis -suspect ETOH-but CT Abdomen suggestive ?microlithiasis-will check Ultrasound Abdomen -mild tenderness in the epigastric region-without any rebound/rigidity -advance to clears today    CAD (coronary artery disease) -stable -no chest pain -LHC with PCI done in June 2012 -spoke with Dr Paulette Blanch has been non complaint to follow up-but suggests that the patient does not need Prasugrel any more-therefore will stop -c/w ASA, Coreg and Statins    Hypertension -off HCTZ-given pancreatitis -c/w Coreg-if BP is an issue will add Amlodipine    Hyperglycemia -await A1C -CBG's currently seem stable    Elevated LFTs -?2/2 to ETOH -downtrending -monitor for now  ETOH use  -no signs for withdrawal -no signs of withdrawal  GERD -PPI  Disposition: Remain inpatient  DVT Prophylaxis: Prophylactic Lovenox   Code Status: Full code   Procedures:  None  CONSULTS:  None  PHYSICAL EXAM: Vital signs in last 24 hours: Filed Vitals:   01/05/13 0400 01/05/13 0430 01/05/13 0525 01/05/13 0832  BP: 147/100 156/98 156/102 155/102  Pulse: 77 83 64 68  Temp:   98.4 F (36.9 C)   TempSrc:   Oral   Resp:   20   Height:   5\' 11"  (1.803 m)   Weight:   81.2 kg (179 lb 0.2 oz)   SpO2: 95% 97% 98%     Weight change:  Body mass index is 24.98 kg/(m^2).   Gen Exam: Awake and alert with clear speech.   Neck: Supple, No JVD.   Chest: B/L Clear.   CVS: S1 S2 Regular, no murmurs.  Abdomen: soft, BS +, mildly tender in the epigastric area, non distended.  Extremities: no edema, lower extremities warm to touch. Neurologic: Non Focal.   Skin: No Rash.   Wounds: N/A.    Intake/Output  from previous day:  Intake/Output Summary (Last 24 hours) at 01/05/13 1034 Last data filed at 01/05/13 0700  Gross per 24 hour  Intake   57.5 ml  Output    300 ml  Net -242.5 ml     LAB RESULTS: CBC  Recent Labs Lab 01/04/13 2227 01/05/13 0535  WBC 5.5 8.5  HGB 13.9 13.6  HCT 38.9* 38.8*  PLT 218 200  MCV 89.0 91.3  MCH 31.8 32.0  MCHC 35.7 35.1  RDW 13.4 13.3  LYMPHSABS 1.8  --   MONOABS 0.4  --   EOSABS 0.0  --   BASOSABS 0.0  --     Chemistries   Recent Labs Lab 01/04/13 2227 01/05/13 0535  NA 134* 133*  K 4.1 3.7  CL 100 100  CO2 19 24  GLUCOSE 208* 147*  BUN 18 14  CREATININE 0.92 0.82  CALCIUM 9.3 9.1  MG  --  2.0    CBG:  Recent Labs Lab 01/05/13 0524 01/05/13 0732  GLUCAP 147* 151*    GFR Estimated Creatinine Clearance: 121.2 ml/min (by C-G formula based on Cr of 0.82).  Coagulation profile No results found for this basename: INR, PROTIME,  in the last 168 hours  Cardiac Enzymes No results found for this basename: CK, CKMB, TROPONINI, MYOGLOBIN,  in the last 168 hours  No components found with this basename: POCBNP,  No results found for this basename: DDIMER,  in the last 72 hours No results  found for this basename: HGBA1C,  in the last 72 hours  Recent Labs  01/05/13 0535  CHOL 79  HDL 35*  LDLCALC 19  TRIG 161  CHOLHDL 2.3    Recent Labs  01/05/13 0535  TSH 3.161   No results found for this basename: VITAMINB12, FOLATE, FERRITIN, TIBC, IRON, RETICCTPCT,  in the last 72 hours  Recent Labs  01/04/13 2227  LIPASE 659*    Urine Studies No results found for this basename: UACOL, UAPR, USPG, UPH, UTP, UGL, UKET, UBIL, UHGB, UNIT, UROB, ULEU, UEPI, UWBC, URBC, UBAC, CAST, CRYS, UCOM, BILUA,  in the last 72 hours  MICROBIOLOGY: No results found for this or any previous visit (from the past 240 hour(s)).  RADIOLOGY STUDIES/RESULTS: Ct Abdomen Pelvis W Contrast  01/05/2013  *RADIOLOGY REPORT*  Clinical Data: Upper  abdominal pain.  Elevated liver function tests and elevated serum lipase.  CT ABDOMEN AND PELVIS WITH CONTRAST  Technique:  Multidetector CT imaging of the abdomen and pelvis was performed following the standard protocol during bolus administration of intravenous contrast.  Contrast: OMNIPAQUE IOHEXOL 300 MG/ML. Oral contrast was also administered.  Comparison: None.  Findings: Mild edema/inflammation in the fat surrounding the head of the pancreas.  Remainder of the pancreas normal in appearance. Entire pancreas enhances normally.  No associated ascites.  Diffuse hepatic steatosis without focal hepatic parenchymal abnormality.  Borderline to mild hepatomegaly.  Spleen normal in size and appearance, with a small focus of accessory splenic tissue anterior to the lower pole.  Normal adrenal glands.  Small cortical cysts involving both kidneys; no significant abnormalities involving either kidney.  Layering sludge versus tiny gallstones in the gallbladder; no CT evidence for acute cholecystitis.  No biliary ductal dilation.  Mild aorto-iliac atherosclerosis.  No significant lymphadenopathy.  Normal appearing stomach, small bowel, and colon.  Normal appendix in the right upper pelvis.  No ascites.  Prostate gland seminal vesicles normal for age.  Urinary bladder unremarkable.  Phleboliths low in the pelvis.  Bone window images unremarkable.  Visualized lung bases clear apart from the expected dependent atelectasis posteriorly in the lower lobes.  IMPRESSION:  1.  Acute pancreatitis involving the head of the pancreatitis.  No complicating features. 2.  Borderline to mild hepatomegaly with diffuse hepatic steatosis. No focal hepatic parenchymal abnormalities. 3.  Layering sludge versus tiny gallstones in the gallbladder without evidence of acute cholecystitis.   Original Report Authenticated By: Hulan Saas, M.D.     MEDICATIONS: Scheduled Meds: . atorvastatin  40 mg Oral q1800  . carvedilol  25 mg Oral  BID WC  . pantoprazole  40 mg Oral Daily  . prasugrel  10 mg Oral Daily   Continuous Infusions: . sodium chloride 150 mL/hr at 01/05/13 0537   PRN Meds:.acetaminophen, acetaminophen, HYDROcodone-acetaminophen, HYDROmorphone (DILAUDID) injection, ondansetron (ZOFRAN) IV, ondansetron  Antibiotics: Anti-infectives   None       Jeoffrey Massed, MD  [facility] Pager:[phone]  If 7PM-7AM, please contact night-coverage [email or web address] Password [email or web address] 01/05/2013, 10:34 AM   LOS: 1 day

## 2013-01-05 NOTE — ED Notes (Signed)
Pt requesting not to have the dilaudid at this time. Wants to talk with the Hospitalist first.

## 2013-01-05 NOTE — H&P (Signed)
PCP: Dr Valaria Good at [facility]  Robynn Pane, MD    Chief Complaint:  abdominal pain  HPI: [name] is a 47 y.o. male   has a past medical history of MI (myocardial infarction); Hypertension; High cholesterol; and Acid reflux.   Presented with  2 day history of indigestion but it become more severe, he started to have mucous filled stools. Today the pain became severe and stabbing and started to radiate to the back. He also had some nausea and vomiting. NO fever or chills. Denies any chest pain. He presented to ED and was found to have elevated lipase and CT scan found to have pancreatitis with some evidence of gallstones versus sludge in the gallbladder but no biliary dilatation.   Review of Systems:    Pertinent positives include: indigestion, abdominal pain, nausea, vomiting, diarrhea, heartburn,  Constitutional:  No weight loss, night sweats, Fevers, chills, fatigue, weight loss  HEENT:  No headaches, Difficulty swallowing,Tooth/dental problems,Sore throat,  No sneezing, itching, ear ache, nasal congestion, post nasal drip,  Cardio-vascular:  No chest pain, Orthopnea, PND, anasarca, dizziness, palpitations.no Bilateral lower extremity swelling  GI:  No change in bowel habits, loss of appetite, melena, blood in stool, hematemesis Resp:  no shortness of breath at rest. No dyspnea on exertion, No excess mucus, no productive cough, No non-productive cough, No coughing up of blood.No change in color of mucus.No wheezing. Skin:  no rash or lesions. No jaundice GU:  no dysuria, change in color of urine, no urgency or frequency. No straining to urinate.  No flank pain.  Musculoskeletal:  No joint pain or no joint swelling. No decreased range of motion. No back pain.  Psych:  No change in mood or affect. No depression or anxiety. No memory loss.  Neuro: no localizing neurological complaints, no tingling, no weakness, no double vision, no gait abnormality, no  slurred speech, no confusion  Otherwise ROS are negative except for above, 10 systems were reviewed  Past Medical History: Past Medical History  Diagnosis Date  . MI (myocardial infarction)     2012  . Hypertension   . High cholesterol   . Acid reflux    Past Surgical History  Procedure Laterality Date  . Stents       Medications: Prior to Admission medications   Medication Sig Start Date End Date Taking? Authorizing Provider  carvedilol (COREG) 25 MG tablet Take 25 mg by mouth 2 (two) times daily with a meal.   Yes Historical Provider, MD  hydrochlorothiazide (HYDRODIURIL) 25 MG tablet Take 25 mg by mouth daily.   Yes Historical Provider, MD  lisinopril (PRINIVIL,ZESTRIL) 40 MG tablet Take 40 mg by mouth daily.   Yes Historical Provider, MD  omeprazole (PRILOSEC) 20 MG capsule Take 20 mg by mouth daily.   Yes Historical Provider, MD  prasugrel (EFFIENT) 10 MG TABS Take 10 mg by mouth daily.   Yes Historical Provider, MD  simvastatin (ZOCOR) 80 MG tablet Take 80 mg by mouth at bedtime.   Yes Historical Provider, MD    Allergies:  No Known Allergies  Social History:  Ambulatory  independently   Lives at  home   reports that he has never smoked. He does not have any smokeless tobacco history on file. He reports that  drinks alcohol. He reports that he does not use illicit drugs.   Family History: family history includes Diabetes in his mother.    Physical Exam: Patient Vitals for the past 24 hrs:  BP Temp Temp src Pulse Resp SpO2 Height Weight  01/05/13 0215 165/122 mmHg - - 80 - 97 % - -  01/05/13 0200 160/107 mmHg - - 72 - 97 % - -  01/05/13 0145 163/114 mmHg - - 75 - 96 % - -  01/05/13 0130 171/116 mmHg - - 82 - 96 % - -  01/05/13 0115 185/110 mmHg - - 82 - 97 % - -  01/05/13 0100 166/107 mmHg - - 80 - 97 % - -  01/05/13 0045 177/117 mmHg - - 89 - 100 % - -  01/05/13 0000 171/117 mmHg - - 81 - 100 % - -  01/04/13 2345 197/118 mmHg - - 76 - 97 % - -  01/04/13  2330 182/117 mmHg - - 79 - 99 % - -  01/04/13 2315 179/119 mmHg - - 84 - 99 % - -  01/04/13 2311 186/117 mmHg 98.6 F (37 C) Oral 76 18 99 % 5\' 11"  (1.803 m) 81.647 kg (180 lb)  01/04/13 2223 190/126 mmHg 98.7 F (37.1 C) Oral 92 18 97 % - -    1. General:  in No Acute distress 2. Psychological: Alert and  Oriented 3. Head/ENT:   Moist  Mucous Membranes                          Head Non traumatic, neck supple                          Normal Dentition 4. SKIN:  decreased Skin turgor,  Skin clean Dry and intact no rash 5. Heart: Regular rate and rhythm no Murmur, Rub or gallop 6. Lungs: Clear to auscultation bilaterally, no wheezes or crackles   7. Abdomen: Soft, non-tender, slightly distended 8. Lower extremities: no clubbing, cyanosis, or edema 9. Neurologically Grossly intact, moving all 4 extremities equally 10. MSK: Normal range of motion  body mass index is 25.12 kg/(m^2).   Labs on Admission:   Recent Labs  01/04/13 2227  NA 134*  K 4.1  CL 100  CO2 19  GLUCOSE 208*  BUN 18  CREATININE 0.92  CALCIUM 9.3    Recent Labs  01/04/13 2227  AST 169*  ALT 94*  ALKPHOS 50  BILITOT 0.4  PROT 7.7  ALBUMIN 3.7    Recent Labs  01/04/13 2227  LIPASE 659*    Recent Labs  01/04/13 2227  WBC 5.5  NEUTROABS 3.2  HGB 13.9  HCT 38.9*  MCV 89.0  PLT 218   No results found for this basename: CKTOTAL, CKMB, CKMBINDEX, TROPONINI,  in the last 72 hours No results found for this basename: TSH, T4TOTAL, FREET3, T3FREE, THYROIDAB,  in the last 72 hours No results found for this basename: VITAMINB12, FOLATE, FERRITIN, TIBC, IRON, RETICCTPCT,  in the last 72 hours  Estimated Creatinine Clearance: 108 ml/min (by C-G formula based on Cr of 0.92). ABG No results found for this basename: phart, pco2, po2, hco3, tco2, acidbasedef, o2sat     No results found for this basename: DDIMER     Other results:  UA no evidence of infection   Cultures: No results found for  this basename: sdes, specrequest, cult, reptstatus       Radiological Exams on Admission: Ct Abdomen Pelvis W Contrast  01/05/2013  *RADIOLOGY REPORT*  Clinical Data: Upper abdominal pain.  Elevated liver function tests and elevated serum lipase.  CT ABDOMEN  AND PELVIS WITH CONTRAST  Technique:  Multidetector CT imaging of the abdomen and pelvis was performed following the standard protocol during bolus administration of intravenous contrast.  Contrast: OMNIPAQUE IOHEXOL 300 MG/ML. Oral contrast was also administered.  Comparison: None.  Findings: Mild edema/inflammation in the fat surrounding the head of the pancreas.  Remainder of the pancreas normal in appearance. Entire pancreas enhances normally.  No associated ascites.  Diffuse hepatic steatosis without focal hepatic parenchymal abnormality.  Borderline to mild hepatomegaly.  Spleen normal in size and appearance, with a small focus of accessory splenic tissue anterior to the lower pole.  Normal adrenal glands.  Small cortical cysts involving both kidneys; no significant abnormalities involving either kidney.  Layering sludge versus tiny gallstones in the gallbladder; no CT evidence for acute cholecystitis.  No biliary ductal dilation.  Mild aorto-iliac atherosclerosis.  No significant lymphadenopathy.  Normal appearing stomach, small bowel, and colon.  Normal appendix in the right upper pelvis.  No ascites.  Prostate gland seminal vesicles normal for age.  Urinary bladder unremarkable.  Phleboliths low in the pelvis.  Bone window images unremarkable.  Visualized lung bases clear apart from the expected dependent atelectasis posteriorly in the lower lobes.  IMPRESSION:  1.  Acute pancreatitis involving the head of the pancreatitis.  No complicating features. 2.  Borderline to mild hepatomegaly with diffuse hepatic steatosis. No focal hepatic parenchymal abnormalities. 3.  Layering sludge versus tiny gallstones in the gallbladder without evidence of  acute cholecystitis.   Original Report Authenticated By: Hulan Saas, M.D.     Chart has been reviewed  Assessment/Plan This is a 47 year old gentleman history of coronary disease and hypertension presenting with epigastric abdominal pain with evidence of pancreatitis.  Present on Admission:  . Pancreatitis - given some evidence of possible small gallbladder stones gallbladder pancreatitis could not be ruled out. Patient's denies heavy alcohol use. LFTs abnormalities not suggestive of obstruction no evidence of biliary dilatation. Also check lipid panel. ACE inhibitor can  cause pancreatitis will DC lisinopril.  Give IV fluids keep patient n.p.o. if no improvement would need  GI consult  . CAD (coronary artery disease) -patient is on effient and Zocor also continued carvediolol no current chest pain . Hypertension - cont carvedilol, hold HCTZ . Hyperglycemia - Is possibly pancreatitis related to the follow blood sugars and obtain hemoglobin A1c.  Elevated LFTs - not an obstructive picture. Do some evidence of hepatic steatosis on a CT scan. Patient does endorse drinking occasionally but denies heavy drinking. Will check lipid panel. Hepatitis panel. effient and at times can cause liver abnormalities. If this is persisting further workup would be needed.   Prophylaxis: SCD, Protonix  CODE STATUS: FULL CODE   Other plan as per orders.  I have spent a total of 55 min on this admission  Sharel Behne 01/05/2013, 2:56 AM

## 2013-01-06 LAB — COMPREHENSIVE METABOLIC PANEL
ALT: 53 U/L (ref 0–53)
AST: 43 U/L — ABNORMAL HIGH (ref 0–37)
Albumin: 3.3 g/dL — ABNORMAL LOW (ref 3.5–5.2)
Alkaline Phosphatase: 49 U/L (ref 39–117)
Calcium: 9.2 mg/dL (ref 8.4–10.5)
GFR calc Af Amer: >90 mL/min (ref >90)
Glucose, Bld: 133 mg/dL — ABNORMAL HIGH (ref 70–99)
Potassium: 3.7 mEq/L (ref 3.5–5.1)
Sodium: 139 mEq/L (ref 135–145)
Total Protein: 7 g/dL (ref 6.0–8.3)

## 2013-01-06 LAB — CBC
Hemoglobin: 12.9 g/dL — ABNORMAL LOW (ref 13.0–17.0)
MCH: 31.6 pg (ref 26.0–34.0)
MCHC: 35.1 g/dL (ref 30.0–36.0)
Platelets: 166 10*3/uL (ref 150–400)
RDW: 13.3 % (ref 11.5–15.5)

## 2013-01-06 LAB — GLUCOSE, CAPILLARY: Glucose-Capillary: 142 mg/dL — ABNORMAL HIGH (ref 70–99)

## 2013-01-06 MED ORDER — AMLODIPINE BESYLATE 5 MG PO TABS: 5.0000 mg | ORAL_TABLET | Freq: Every day | ORAL | Status: DC

## 2013-01-06 MED ORDER — HYDRALAZINE HCL 20 MG/ML IJ SOLN
10.0000 mg | Freq: Four times a day (QID) | INTRAMUSCULAR | Status: DC | PRN
  Filled 2013-01-06: qty 0.5

## 2013-01-06 MED ORDER — AMLODIPINE BESYLATE 5 MG PO TABS
5.0000 mg | ORAL_TABLET | Freq: Every day | ORAL | Status: DC
  Administered 2013-01-06: 5 mg via ORAL
  Filled 2013-01-06 (×2): qty 1

## 2013-01-06 MED ORDER — ACETAMINOPHEN 325 MG PO TABS: 650.0000 mg | ORAL_TABLET | Freq: Four times a day (QID) | ORAL | Status: DC | PRN

## 2013-01-06 NOTE — Care Management Note (Signed)
    Page 1 of 1   01/06/2013     12:31:21 PM   CARE MANAGEMENT NOTE 01/06/2013  Patient:  [name], [name]   Account Number:  [id number]  Date Initiated:  01/06/2013  Documentation initiated by:  Letha Cape  Subjective/Objective Assessment:   dx pancreatitis  admit- lives alone, pta indep.     Action/Plan:   Anticipated DC Date:  01/06/2013   Anticipated DC Plan:  HOME/SELF CARE      DC Planning Services  CM consult      Choice offered to / List presented to:             Status of service:  Completed, signed off Medicare Important Message given?   (If response is "NO", the following Medicare IM given date fields will be blank) Date Medicare IM given:   Date Additional Medicare IM given:    Discharge Disposition:  HOME/SELF CARE  Per UR Regulation:  Reviewed for med. necessity/level of care/duration of stay  If discussed at Long Length of Stay Meetings, dates discussed:    Comments:  01/06/13 12:27 Letha Cape RN, BSN [phone] patient lives alone, patient is for dc today, he states he has transportation and he can afford his medications, he gets them from the [place].  No needs anticipated.

## 2013-01-06 NOTE — Progress Notes (Signed)
[name] discharged Home per MD order.  Discharge instructions reviewed and discussed with the patient, all questions and concerns answered. Copy of instructions and scripts given to patient.  Explained how to sign up for Mychart.    Medication List    STOP taking these medications       hydrochlorothiazide 25 MG tablet  Commonly known as:  HYDRODIURIL     prasugrel 10 MG Tabs  Commonly known as:  EFFIENT      TAKE these medications       acetaminophen 325 MG tablet  Commonly known as:  TYLENOL  Take 2 tablets (650 mg total) by mouth every 6 (six) hours as needed.     amLODipine 5 MG tablet  Commonly known as:  NORVASC  Take 1 tablet (5 mg total) by mouth daily.     carvedilol 25 MG tablet  Commonly known as:  COREG  Take 25 mg by mouth 2 (two) times daily with a meal.     lisinopril 40 MG tablet  Commonly known as:  PRINIVIL,ZESTRIL  Take 40 mg by mouth daily.     omeprazole 20 MG capsule  Commonly known as:  PRILOSEC  Take 20 mg by mouth daily.     simvastatin 80 MG tablet  Commonly known as:  ZOCOR  Take 80 mg by mouth at bedtime.        Patients skin is clean, dry and intact, no evidence of skin break down. IV site discontinued and catheter remains intact. Site without signs and symptoms of complications. Dressing and pressure applied.  Patient escorted to car by NT ambulatory,  no distress noted upon discharge.  Scarfo, [name] 01/06/2013 2:49 PM

## 2013-01-06 NOTE — Progress Notes (Signed)
PATIENT DETAILS Name: [name] Age: 47 y.o. Sex: male Date of Birth: 1966-06-15 Admit Date: 01/04/2013 Admitting Physician Therisa Doyne, MD ZOX:[id number],UJWJX N, MD  Subjective: Pain resolved-tolerating clears  Assessment/Plan: Active Problems:   Pancreatitis -suspect ETOH-but CT Abdomen suggestive ?microlithiasis-however Ultrasound Abdomen negative for cholelithiasis-shows gall bladder polyp. Patient did indicate to me yesterday-that he would have cholecystectomy at [facility] if indicated -notenderness in the epigastric region-today -advance to full liquids-and then to low fat diet-if he continues to do well-he will likely be discharged home later today    CAD (coronary artery disease) -stable -no chest pain -LHC with PCI done in June 2012 -spoke with Dr Paulette Blanch has been non complaint to follow up-but suggests that the patient does not need Prasugrel any more-therefore will stop -c/w ASA, Coreg and Statins    Hypertension -off HCTZ-given pancreatitis - add Amlodipine -c/w Coreg    Hyperglycemia - A1C-6.7-will speak with patient to see if he wants to go on Metformin-he however needs to stop ETOH with this agent. -CBG's currently seem stable    Elevated LFTs -?2/2 to ETOH -downtrending significantly -monitor for now  ETOH use  -no signs for withdrawal  GERD -PPI  Disposition: Remain inpatient-probable home later today-if tolerates diet  DVT Prophylaxis: Prophylactic Lovenox   Code Status: Full code   Procedures:  None  CONSULTS:  None  PHYSICAL EXAM: Vital signs in last 24 hours: Filed Vitals:   01/05/13 2230 01/06/13 0530 01/06/13 0622 01/06/13 0623  BP: 162/99 188/106 209/133 180/102  Pulse: 70 64    Temp: 99.6 F (37.6 C) 98.1 F (36.7 C)    TempSrc: Oral Oral    Resp: 18 20    Height:      Weight:      SpO2: 100% 100%      Weight change:  Body mass index is 24.98 kg/(m^2).   Gen Exam: Awake and alert with clear speech.    Neck: Supple, No JVD.   Chest: B/L Clear.   CVS: S1 S2 Regular, no murmurs.  Abdomen: soft, BS +, non tender today Extremities: no edema, lower extremities warm to touch. Neurologic: Non Focal.   Skin: No Rash.   Wounds: N/A.    Intake/Output from previous day:  Intake/Output Summary (Last 24 hours) at 01/06/13 0904 Last data filed at 01/06/13 0900  Gross per 24 hour  Intake    600 ml  Output      0 ml  Net    600 ml     LAB RESULTS: CBC  Recent Labs Lab 01/04/13 2227 01/05/13 0535 01/06/13 0550  WBC 5.5 8.5 7.0  HGB 13.9 13.6 12.9*  HCT 38.9* 38.8* 36.7*  PLT 218 200 166  MCV 89.0 91.3 90.0  MCH 31.8 32.0 31.6  MCHC 35.7 35.1 35.1  RDW 13.4 13.3 13.3  LYMPHSABS 1.8  --   --   MONOABS 0.4  --   --   EOSABS 0.0  --   --   BASOSABS 0.0  --   --     Chemistries   Recent Labs Lab 01/04/13 2227 01/05/13 0535 01/06/13 0550  NA 134* 133* 139  K 4.1 3.7 3.7  CL 100 100 102  CO2 19 24 27   GLUCOSE 208* 147* 133*  BUN 18 14 5*  CREATININE 0.92 0.82 0.92  CALCIUM 9.3 9.1 9.2  MG  --  2.0  --     CBG:  Recent Labs Lab 01/05/13 0524 01/05/13 0732 01/05/13 1204 01/05/13 1724  01/06/13 0737  GLUCAP 147* 151* 162* 120* 142*    GFR Estimated Creatinine Clearance: 108 ml/min (by C-G formula based on Cr of 0.92).  Coagulation profile No results found for this basename: INR, PROTIME,  in the last 168 hours  Cardiac Enzymes No results found for this basename: CK, CKMB, TROPONINI, MYOGLOBIN,  in the last 168 hours  No components found with this basename: POCBNP,  No results found for this basename: DDIMER,  in the last 72 hours  Recent Labs  01/05/13 0535  HGBA1C 6.7*    Recent Labs  01/05/13 0535  CHOL 79  HDL 35*  LDLCALC 19  TRIG 914  CHOLHDL 2.3    Recent Labs  01/05/13 0535  TSH 3.161   No results found for this basename: VITAMINB12, FOLATE, FERRITIN, TIBC, IRON, RETICCTPCT,  in the last 72 hours  Recent Labs  01/04/13 2227  01/06/13 0550  LIPASE 659* 266*    Urine Studies No results found for this basename: UACOL, UAPR, USPG, UPH, UTP, UGL, UKET, UBIL, UHGB, UNIT, UROB, ULEU, UEPI, UWBC, URBC, UBAC, CAST, CRYS, UCOM, BILUA,  in the last 72 hours  MICROBIOLOGY: No results found for this or any previous visit (from the past 240 hour(s)).  RADIOLOGY STUDIES/RESULTS: Ct Abdomen Pelvis W Contrast  01/05/2013  *RADIOLOGY REPORT*  Clinical Data: Upper abdominal pain.  Elevated liver function tests and elevated serum lipase.  CT ABDOMEN AND PELVIS WITH CONTRAST  Technique:  Multidetector CT imaging of the abdomen and pelvis was performed following the standard protocol during bolus administration of intravenous contrast.  Contrast: OMNIPAQUE IOHEXOL 300 MG/ML. Oral contrast was also administered.  Comparison: None.  Findings: Mild edema/inflammation in the fat surrounding the head of the pancreas.  Remainder of the pancreas normal in appearance. Entire pancreas enhances normally.  No associated ascites.  Diffuse hepatic steatosis without focal hepatic parenchymal abnormality.  Borderline to mild hepatomegaly.  Spleen normal in size and appearance, with a small focus of accessory splenic tissue anterior to the lower pole.  Normal adrenal glands.  Small cortical cysts involving both kidneys; no significant abnormalities involving either kidney.  Layering sludge versus tiny gallstones in the gallbladder; no CT evidence for acute cholecystitis.  No biliary ductal dilation.  Mild aorto-iliac atherosclerosis.  No significant lymphadenopathy.  Normal appearing stomach, small bowel, and colon.  Normal appendix in the right upper pelvis.  No ascites.  Prostate gland seminal vesicles normal for age.  Urinary bladder unremarkable.  Phleboliths low in the pelvis.  Bone window images unremarkable.  Visualized lung bases clear apart from the expected dependent atelectasis posteriorly in the lower lobes.  IMPRESSION:  1.  Acute  pancreatitis involving the head of the pancreatitis.  No complicating features. 2.  Borderline to mild hepatomegaly with diffuse hepatic steatosis. No focal hepatic parenchymal abnormalities. 3.  Layering sludge versus tiny gallstones in the gallbladder without evidence of acute cholecystitis.   Original Report Authenticated By: Hulan Saas, M.D.     MEDICATIONS: Scheduled Meds: . amLODipine  5 mg Oral Daily  . atorvastatin  40 mg Oral q1800  . carvedilol  25 mg Oral BID WC  . enoxaparin (LOVENOX) injection  40 mg Subcutaneous Q24H  . pantoprazole  40 mg Oral Daily   Continuous Infusions:   PRN Meds:.acetaminophen, acetaminophen, HYDROcodone-acetaminophen, HYDROmorphone (DILAUDID) injection, ondansetron (ZOFRAN) IV, ondansetron  Antibiotics: Anti-infectives   None       Jeoffrey Massed, MD  [facility] Pager:[phone]  If 7PM-7AM, please contact  night-coverage [email or web address] Password [email or web address] 01/06/2013, 9:04 AM   LOS: 2 days

## 2013-01-06 NOTE — Plan of Care (Signed)
Problem: Phase I Progression Outcomes Goal: Initial discharge plan identified Outcome: Completed/Met Date Met:  01/06/13 To return home

## 2013-01-06 NOTE — Discharge Summary (Signed)
PATIENT DETAILS Name: [name] Age: 47 y.o. Sex: male Date of Birth: Jan 20, 1966 MRN: [id number]. Admit Date: 01/04/2013 Admitting Physician: Therisa Doyne, MD WUJ:[id number],WGNFA N, MD  Recommendations for Outpatient Follow-up:  1. Counseled against further EtOH use 2. Optimize antihypertensive medications 3. A1c 6.7-patient does not want to go on oral hypoglycemic agents-have emphasized on diet and exercise-please recheck A1c next visit  PRIMARY DISCHARGE DIAGNOSIS:  Active Problems:   Pancreatitis   CAD (coronary artery disease)   Hypertension   Hyperglycemia   Elevated LFTs      PAST MEDICAL HISTORY: Past Medical History  Diagnosis Date  . MI (myocardial infarction)     2012  . Hypertension   . High cholesterol   . Acid reflux     DISCHARGE MEDICATIONS:   Medication List    STOP taking these medications       hydrochlorothiazide 25 MG tablet  Commonly known as:  HYDRODIURIL     prasugrel 10 MG Tabs  Commonly known as:  EFFIENT      TAKE these medications       acetaminophen 325 MG tablet  Commonly known as:  TYLENOL  Take 2 tablets (650 mg total) by mouth every 6 (six) hours as needed.     amLODipine 5 MG tablet  Commonly known as:  NORVASC  Take 1 tablet (5 mg total) by mouth daily.     carvedilol 25 MG tablet  Commonly known as:  COREG  Take 25 mg by mouth 2 (two) times daily with a meal.     lisinopril 40 MG tablet  Commonly known as:  PRINIVIL,ZESTRIL  Take 40 mg by mouth daily.     omeprazole 20 MG capsule  Commonly known as:  PRILOSEC  Take 20 mg by mouth daily.     simvastatin 80 MG tablet  Commonly known as:  ZOCOR  Take 80 mg by mouth at bedtime.         BRIEF HPI:  See H&P, Labs, Consult and Test reports for all details in brief, patient is a 47 year old male with a history of coronary artery disease status post PCI in 2012, hypertension, dyslipidemia, gastroesophageal reflux disease who came in with abdominal pain  associated with nausea.  CONSULTATIONS:   None  PERTINENT RADIOLOGIC STUDIES: US Abdomen Complete  01/05/2013  *RADIOLOGY REPORT*  Clinical Data:  Acute pancreatitis.  Sludge versus small gallstones in the gallbladder on recent CT examination.  COMPLETE ABDOMINAL ULTRASOUND  Comparison:  None.  Findings:  Gallbladder:  Approximate 4 mm polyp arising from the fundal wall. No evidence of cholelithiasis or sludge as questioned on CT.  No gallbladder wall thickening or pericholecystic fluid.  Negative sonographic Murphy's sign according to the ultrasound technologist.  Common bile duct:  Normal in caliber with maximum diameter approximating 4 - 5 mm.  Liver:  Diffusely increasing coarsened echotexture without focal hepatic parenchymal abnormality.  Patent portal vein with hepatopetal flow.  IVC:  Patent.  Pancreas:  Normal appearing body and tail.  Head obscured by duodenal bowel gas.  Spleen:  Normal size and echotexture without focal parenchymal abnormality.  Right Kidney:  No hydronephrosis.  Well-preserved cortex.  No shadowing calculi.  Normal size and parenchymal echotexture without focal abnormalities.  Approximately 11.9 cm in length.  Left Kidney:  No hydronephrosis.  Well-preserved cortex.  No shadowing calculi.  Normal size and parenchymal echotexture without focal abnormalities.  Approximately 12.8 cm in length.  Abdominal aorta:  Normal in caliber throughout its visualized course  in the abdomen without significant atherosclerosis.  IMPRESSION:  1.  4 mm polyp in the gallbladder fundus.  No evidence of cholelithiasis or gallbladder sludge as questioned on CT. 2.  Diffuse hepatic steatosis without focal hepatic parenchymal abnormality, as noted on CT. 3.  Pancreatic head obscured by duodenal bowel gas and therefore not evaluated.  Remainder of the examination is normal.   Original Report Authenticated By: Hulan Saas, M.D.    Ct Abdomen Pelvis W Contrast  01/05/2013  *RADIOLOGY REPORT*  Clinical  Data: Upper abdominal pain.  Elevated liver function tests and elevated serum lipase.  CT ABDOMEN AND PELVIS WITH CONTRAST  Technique:  Multidetector CT imaging of the abdomen and pelvis was performed following the standard protocol during bolus administration of intravenous contrast.  Contrast: OMNIPAQUE IOHEXOL 300 MG/ML. Oral contrast was also administered.  Comparison: None.  Findings: Mild edema/inflammation in the fat surrounding the head of the pancreas.  Remainder of the pancreas normal in appearance. Entire pancreas enhances normally.  No associated ascites.  Diffuse hepatic steatosis without focal hepatic parenchymal abnormality.  Borderline to mild hepatomegaly.  Spleen normal in size and appearance, with a small focus of accessory splenic tissue anterior to the lower pole.  Normal adrenal glands.  Small cortical cysts involving both kidneys; no significant abnormalities involving either kidney.  Layering sludge versus tiny gallstones in the gallbladder; no CT evidence for acute cholecystitis.  No biliary ductal dilation.  Mild aorto-iliac atherosclerosis.  No significant lymphadenopathy.  Normal appearing stomach, small bowel, and colon.  Normal appendix in the right upper pelvis.  No ascites.  Prostate gland seminal vesicles normal for age.  Urinary bladder unremarkable.  Phleboliths low in the pelvis.  Bone window images unremarkable.  Visualized lung bases clear apart from the expected dependent atelectasis posteriorly in the lower lobes.  IMPRESSION:  1.  Acute pancreatitis involving the head of the pancreatitis.  No complicating features. 2.  Borderline to mild hepatomegaly with diffuse hepatic steatosis. No focal hepatic parenchymal abnormalities. 3.  Layering sludge versus tiny gallstones in the gallbladder without evidence of acute cholecystitis.   Original Report Authenticated By: Hulan Saas, M.D.      PERTINENT LAB RESULTS: CBC:  Recent Labs  01/05/13 0535 01/06/13 0550   WBC 8.5 7.0  HGB 13.6 12.9*  HCT 38.8* 36.7*  PLT 200 166   CMET CMP     Component Value Date/Time   NA 139 01/06/2013 0550   K 3.7 01/06/2013 0550   CL 102 01/06/2013 0550   CO2 27 01/06/2013 0550   GLUCOSE 133* 01/06/2013 0550   BUN 5* 01/06/2013 0550   CREATININE 0.92 01/06/2013 0550   CALCIUM 9.2 01/06/2013 0550   PROT 7.0 01/06/2013 0550   ALBUMIN 3.3* 01/06/2013 0550   AST 43* 01/06/2013 0550   ALT 53 01/06/2013 0550   ALKPHOS 49 01/06/2013 0550   BILITOT 0.8 01/06/2013 0550   GFRNONAA >90 01/06/2013 0550   GFRAA >90 01/06/2013 0550    GFR Estimated Creatinine Clearance: 108 ml/min (by C-G formula based on Cr of 0.92).  Recent Labs  01/04/13 2227 01/06/13 0550  LIPASE 659* 266*   No results found for this basename: CKTOTAL, CKMB, CKMBINDEX, TROPONINI,  in the last 72 hours No components found with this basename: POCBNP,  No results found for this basename: DDIMER,  in the last 72 hours  Recent Labs  01/05/13 0535  HGBA1C 6.7*    Recent Labs  01/05/13 0535  CHOL 79  HDL  35*  LDLCALC 19  TRIG 125  CHOLHDL 2.3    Recent Labs  01/05/13 0535  TSH 3.161   No results found for this basename: VITAMINB12, FOLATE, FERRITIN, TIBC, IRON, RETICCTPCT,  in the last 72 hours Coags: No results found for this basename: PT, INR,  in the last 72 hours Microbiology: No results found for this or any previous visit (from the past 240 hour(s)).   BRIEF HOSPITAL COURSE:   Active Problems:   Pancreatitis - patient was in mid with abdominal pain, had significantly elevated lipase levels. CT scan of the abdomen did show acute pancreatitis involving the head of the pancreas. CT scan of the abdomen did also show layering sludge in the gallbladder, a abdominal ultrasound showed a gallbladder polyp without any cholelithiasis. Patient does have a history of heavy alcohol use recently, he claims that his mother passed away approximately a month ago and he has been using excessive  alcohol. His LFTs were also elevated, his AST was almost twice the ALT .patient was admitted to the hospital, was given IV fluids, placed n.p.o. Slowly started to feel better, he was then given clear liquids and her diet slowly advanced. Today he has tolerated a low-fat diet, he has no nausea vomiting abdominal pain. He is requesting that he be discharged home, on exam his belly is very soft without any tenderness in the epigastric area. He has been counseled extensively about the need to completely stop alcohol, he knows that the alcohol can cause pancreatitis can be life-threatening, he also knows about the risk of chronic pancreatitis and chronic pain. The risk of alcohol causing frank cirrhosis was also discussed, he understands all these risks and has repeatedly claimed to me that he would never drink alcohol from now on.    CAD (coronary artery disease)  -stable  -no chest pain  -LHC with PCI done in June 2012  -spoke with Dr Paulette Blanch has been non complaint to follow up-but suggests that the patient does not need Prasugrel any more-therefore will stop  -c/w ASA, Coreg and Statins  Hypertension  -off HCTZ-given pancreatitis  - add Amlodipine  -c/w Coreg and lisinopril on discharge  Hyperglycemia  - A1C-6.7- spoke with patient to see if he wants to go on Metformin-he however needs to stop ETOH with this agent. At this time he prefers to try lifestyle and diet modification, he will followup with his primary care practitioner for further continued monitoring of A1c  Elevated LFTs  -Likely 2/2 to ETOH  -downtrending significantly  -monitor for now - need to completely abstain from alcohol - Have counseled extensively over the past few days  GERD  -PPI - Stable  TODAY-DAY OF DISCHARGE:  Subjective:   [name] today has no headache,no chest abdominal pain,no new weakness tingling or numbness, feels much better wants to go home today.   Objective:   Blood pressure  156/96, pulse 103, temperature 98.7 F (37.1 C), temperature source Oral, resp. rate 20, height 5\' 11"  (1.803 m), weight 81.2 kg (179 lb 0.2 oz), SpO2 98.00%.  Intake/Output Summary (Last 24 hours) at 01/06/13 1431 Last data filed at 01/06/13 1300  Gross per 24 hour  Intake    822 ml  Output    250 ml  Net    572 ml    Exam Awake Alert, Oriented *3, No new F.N deficits, Normal affect [place].AT,PERRAL Supple Neck,No JVD, No cervical lymphadenopathy appriciated.  Symmetrical Chest wall movement, Good air movement bilaterally, CTAB RRR,No Gallops,Rubs  or new Murmurs, No Parasternal Heave +ve B.Sounds, Abd Soft, Non tender, No organomegaly appriciated, No rebound -guarding or rigidity. No Cyanosis, Clubbing or edema, No new Rash or bruise  DISCHARGE CONDITION: Stable  DISPOSITION: HOME  DISCHARGE INSTRUCTIONS:    Activity:  As tolerated   Diet recommendation: Diabetic Diet Heart Healthy diet Low-fat diet       Follow-up Information   Please follow up. (please call your Primary Care Practitioner at the [facility] at [facility] in 1 week for hospital follow up appt)      Total Time spent on discharge equals 45 minutes.  SignedJeoffrey Massed 01/06/2013 2:31 PM

## 2013-01-06 NOTE — Progress Notes (Signed)
VSS - Blood pressure 163/115, pulse 64, temperature 98.1 F (36.7 C), temperature source Oral, resp. rate 20, height 5\' 11"  (1.803 m), weight 81.2 kg (179 lb 0.2 oz), SpO2 100.00%., R/A.  Informed Dr. Jerral Ralph of elevated BP, before given scheduled norvasc that was scheduled.  No new orders, will continue to monitor.  Forbes Cellar, RN

## 2013-01-06 NOTE — Progress Notes (Signed)
Covering Clinical Child psychotherapist (CSW) informed that pt had questions regarding paying his medical bill at discharge. CSW visited pt room and informed pt that it will be a few weeks before he receives a medical bill, and when he does he is encouraged to call the number on the bill. CSW informed pt that the billing dept would inform pt of any programs he would qualify for to assist in paying his bill. Pt very appreciative, no additional questions or concerns.   CSW signing off.  Theresia Bough, MSW, Theresia Majors [phone]

## 2013-01-06 NOTE — Progress Notes (Signed)
Pts manual BP 180/102. Anthony Poag, NP notified. Pt states he is not taking his BP meds while he's been at the hospital.

## 2014-11-20 ENCOUNTER — Encounter (HOSPITAL_COMMUNITY): Payer: Self-pay | Admitting: Emergency Medicine

## 2014-11-20 ENCOUNTER — Emergency Department (HOSPITAL_COMMUNITY): Payer: No Typology Code available for payment source

## 2014-11-20 ENCOUNTER — Emergency Department (HOSPITAL_COMMUNITY)
Admission: EM | Admit: 2014-11-20 | Discharge: 2014-11-20 | Disposition: A | Discharge status: Home / Self Care | Payer: No Typology Code available for payment source | Attending: Emergency Medicine | Admitting: Emergency Medicine

## 2014-11-20 DIAGNOSIS — Z7982 Long term (current) use of aspirin: Secondary | ICD-10-CM | POA: Insufficient documentation

## 2014-11-20 DIAGNOSIS — K219 Gastro-esophageal reflux disease without esophagitis: Secondary | ICD-10-CM | POA: Insufficient documentation

## 2014-11-20 DIAGNOSIS — S0990XA Unspecified injury of head, initial encounter: Secondary | ICD-10-CM | POA: Diagnosis present

## 2014-11-20 DIAGNOSIS — Z79899 Other long term (current) drug therapy: Secondary | ICD-10-CM | POA: Diagnosis not present

## 2014-11-20 DIAGNOSIS — Y9301 Activity, walking, marching and hiking: Secondary | ICD-10-CM | POA: Insufficient documentation

## 2014-11-20 DIAGNOSIS — I10 Essential (primary) hypertension: Secondary | ICD-10-CM | POA: Diagnosis not present

## 2014-11-20 DIAGNOSIS — I609 Nontraumatic subarachnoid hemorrhage, unspecified: Secondary | ICD-10-CM

## 2014-11-20 DIAGNOSIS — S0181XA Laceration without foreign body of other part of head, initial encounter: Secondary | ICD-10-CM | POA: Diagnosis not present

## 2014-11-20 DIAGNOSIS — S066X0A Traumatic subarachnoid hemorrhage without loss of consciousness, initial encounter: Secondary | ICD-10-CM | POA: Insufficient documentation

## 2014-11-20 DIAGNOSIS — F10929 Alcohol use, unspecified with intoxication, unspecified: Secondary | ICD-10-CM | POA: Diagnosis not present

## 2014-11-20 DIAGNOSIS — S0101XA Laceration without foreign body of scalp, initial encounter: Secondary | ICD-10-CM | POA: Diagnosis not present

## 2014-11-20 DIAGNOSIS — Y998 Other external cause status: Secondary | ICD-10-CM | POA: Diagnosis not present

## 2014-11-20 DIAGNOSIS — I252 Old myocardial infarction: Secondary | ICD-10-CM | POA: Insufficient documentation

## 2014-11-20 DIAGNOSIS — S80212A Abrasion, left knee, initial encounter: Secondary | ICD-10-CM | POA: Insufficient documentation

## 2014-11-20 DIAGNOSIS — Z23 Encounter for immunization: Secondary | ICD-10-CM | POA: Insufficient documentation

## 2014-11-20 DIAGNOSIS — Y92410 Unspecified street and highway as the place of occurrence of the external cause: Secondary | ICD-10-CM | POA: Diagnosis not present

## 2014-11-20 DIAGNOSIS — E780 Pure hypercholesterolemia: Secondary | ICD-10-CM | POA: Insufficient documentation

## 2014-11-20 LAB — URINE MICROSCOPIC-ADD ON

## 2014-11-20 LAB — CBC WITH DIFFERENTIAL/PLATELET
Basophils Absolute: 0 10*3/uL (ref 0.0–0.1)
Basophils Relative: 0 % (ref 0–1)
EOS PCT: 0 % (ref 0–5)
Eosinophils Absolute: 0 10*3/uL (ref 0.0–0.7)
HCT: 41 % (ref 39.0–52.0)
Hemoglobin: 13.8 g/dL (ref 13.0–17.0)
Lymphocytes Relative: 15 % (ref 12–46)
Lymphs Abs: 1.6 10*3/uL (ref 0.7–4.0)
MCH: 30.7 pg (ref 26.0–34.0)
MCHC: 33.7 g/dL (ref 30.0–36.0)
MCV: 91.3 fL (ref 78.0–100.0)
MONO ABS: 0.5 10*3/uL (ref 0.1–1.0)
Monocytes Relative: 5 % (ref 3–12)
NEUTROS ABS: 8.3 10*3/uL — AB (ref 1.7–7.7)
Neutrophils Relative %: 80 % — ABNORMAL HIGH (ref 43–77)
Platelets: 269 10*3/uL (ref 150–400)
RBC: 4.49 MIL/uL (ref 4.22–5.81)
RDW: 15.2 % (ref 11.5–15.5)
WBC: 10.5 10*3/uL (ref 4.0–10.5)

## 2014-11-20 LAB — URINALYSIS, ROUTINE W REFLEX MICROSCOPIC
Bilirubin Urine: NEGATIVE
Glucose, UA: NEGATIVE mg/dL
Ketones, ur: 15 mg/dL — AB
Leukocytes, UA: NEGATIVE
NITRITE: NEGATIVE
PH: 5 (ref 5.0–8.0)
Protein, ur: >300 mg/dL — AB
Specific Gravity, Urine: 1.031 — ABNORMAL HIGH (ref 1.005–1.030)
Urobilinogen, UA: 1 mg/dL (ref 0.0–1.0)

## 2014-11-20 LAB — COMPREHENSIVE METABOLIC PANEL
ALT: 28 U/L (ref 0–53)
AST: 34 U/L (ref 0–37)
Albumin: 3.9 g/dL (ref 3.5–5.2)
Alkaline Phosphatase: 81 U/L (ref 39–117)
Anion gap: 9 (ref 5–15)
BUN: 12 mg/dL (ref 6–23)
CALCIUM: 9.3 mg/dL (ref 8.4–10.5)
CO2: 26 mmol/L (ref 19–32)
Chloride: 105 mmol/L (ref 96–112)
Creatinine, Ser: 1.05 mg/dL (ref 0.50–1.35)
GFR calc Af Amer: >90 mL/min (ref >90)
GFR calc non Af Amer: 83 mL/min — ABNORMAL LOW (ref >90)
GLUCOSE: 152 mg/dL — AB (ref 70–99)
POTASSIUM: 4.2 mmol/L (ref 3.5–5.1)
SODIUM: 140 mmol/L (ref 135–145)
Total Bilirubin: 0.2 mg/dL — ABNORMAL LOW (ref 0.3–1.2)
Total Protein: 8.1 g/dL (ref 6.0–8.3)

## 2014-11-20 LAB — ETHANOL: ALCOHOL ETHYL (B): 170 mg/dL — AB (ref 0–9)

## 2014-11-20 MED ORDER — TETANUS-DIPHTHERIA TOXOIDS TD 5-2 LFU IM INJ
0.5000 mL | INJECTION | Freq: Once | INTRAMUSCULAR | Status: AC
  Administered 2014-11-20: 0.5 mL via INTRAMUSCULAR
  Filled 2014-11-20: qty 0.5

## 2014-11-20 NOTE — ED Notes (Signed)
Collected urin sample via urinal.

## 2014-11-20 NOTE — ED Notes (Signed)
Ice pack applied to pts forehead

## 2014-11-20 NOTE — ED Notes (Signed)
Ice pack re-applied to pts forehead.

## 2014-11-20 NOTE — Discharge Instructions (Signed)
Use ice on the sore areas 3 or 4 times a day, for 2 days.  Keep the wounds clean with soap and water twice a day. Use ibuprofen or Tylenol for pain Avoid heavy alcohol usage Return here if needed, for problems    Facial Laceration A facial laceration is a cut on the face. These injuries can be painful and cause bleeding. Some cuts may need to be closed with stitches (sutures), skin adhesive strips, or wound glue. Cuts usually heal quickly but can leave a scar. It can take 1-2 years for the scar to go away completely. HOME CARE   Only take medicines as told by your doctor.  Follow your doctor's instructions for wound care. For Stitches:  Keep the cut clean and dry.  If you have a bandage (dressing), change it at least once a day. Change the bandage if it gets wet or dirty, or as told by your doctor.  Wash the cut with soap and water 2 times a day. Rinse the cut with water. Pat it dry with a clean towel.  Put a thin layer of medicated cream on the cut as told by your doctor.  You may shower after the first 24 hours. Do not soak the cut in water until the stitches are removed.  Have your stitches removed as told by your doctor.  Do not wear any makeup until a few days after your stitches are removed. For Skin Adhesive Strips:  Keep the cut clean and dry.  Do not get the strips wet. You may take a bath, but be careful to keep the cut dry.  If the cut gets wet, pat it dry with a clean towel.  The strips will fall off on their own. Do not remove the strips that are still stuck to the cut. For Wound Glue:  You may shower or take baths. Do not soak or scrub the cut. Do not swim. Avoid heavy sweating until the glue falls off on its own. After a shower or bath, pat the cut dry with a clean towel.  Do not put medicine or makeup on your cut until the glue falls off.  If you have a bandage, do not put tape over the glue.  Avoid lots of sunlight or tanning lamps until the glue falls  off.  The glue will fall off on its own in 5-10 days. Do not pick at the glue. After Healing: Put sunscreen on the cut for the first year to reduce your scar. GET HELP RIGHT AWAY IF:   Your cut area gets red, painful, or puffy (swollen).  You see a yellowish-white fluid (pus) coming from the cut.  You have chills or a fever. MAKE SURE YOU:   Understand these instructions.  Will watch your condition.  Will get help right away if you are not doing well or get worse. Document Released: 03/24/2008 Document Revised: 07/27/2013 Document Reviewed: 05/19/2013 [facility] Patient Information 2015 [place], [place]. This information is not intended to replace advice given to you by your health care provider. Make sure you discuss any questions you have with your health care provider.  Head Injury You have a head injury. Headaches and throwing up (vomiting) are common after a head injury. It should be easy to wake up from sleeping. Sometimes you must stay in the hospital. Most problems happen within the first 24 hours. Side effects may occur up to 7-10 days after the injury.  WHAT ARE THE TYPES OF HEAD INJURIES? Head injuries  can be as minor as a bump. Some head injuries can be more severe. More severe head injuries include:  A jarring injury to the brain (concussion).  A bruise of the brain (contusion). This mean there is bleeding in the brain that can cause swelling.  A cracked skull (skull fracture).  Bleeding in the brain that collects, clots, and forms a bump (hematoma). WHEN SHOULD I GET HELP RIGHT AWAY?   You are confused or sleepy.  You cannot be woken up.  You feel sick to your stomach (nauseous) or keep throwing up (vomiting).  Your dizziness or unsteadiness is getting worse.  You have very bad, lasting headaches that are not helped by medicine. Take medicines only as told by your doctor.  You cannot use your arms or legs like normal.  You cannot walk.  You notice changes  in the black spots in the center of the colored part of your eye (pupil).  You have clear or bloody fluid coming from your nose or ears.  You have trouble seeing. During the next 24 hours after the injury, you must stay with someone who can watch you. This person should get help right away (call 911 in the [place] if you start to shake and are not able to control it (have seizures), you pass out, or you are unable to wake up. HOW CAN I PREVENT A HEAD INJURY IN THE FUTURE?  Wear seat belts.  Wear a helmet while bike riding and playing sports like football.  Stay away from dangerous activities around the house. WHEN CAN I RETURN TO NORMAL ACTIVITIES AND ATHLETICS? See your doctor before doing these activities. You should not do normal activities or play contact sports until 1 week after the following symptoms have stopped:  Headache that does not go away.  Dizziness.  Poor attention.  Confusion.  Memory problems.  Sickness to your stomach or throwing up.  Tiredness.  Fussiness.  Bothered by bright lights or loud noises.  Anxiousness or depression.  Restless sleep. MAKE SURE YOU:   Understand these instructions.  Will watch your condition.  Will get help right away if you are not doing well or get worse. Document Released: 09/18/2008 Document Revised: 02/20/2014 Document Reviewed: 06/13/2013 [facility] Patient Information 2015 [place], [place]. This information is not intended to replace advice given to you by your health care provider. Make sure you discuss any questions you have with your health care provider.  Laceration Care, Adult A laceration is a cut that goes through all layers of the skin. The cut goes into the tissue beneath the skin. HOME CARE For stitches (sutures) or staples:  Keep the cut clean and dry.  If you have a bandage (dressing), change it at least once a day. Change the bandage if it gets wet or dirty, or as told by your doctor.  Wash the cut  with soap and water 2 times a day. Rinse the cut with water. Pat it dry with a clean towel.  Put a thin layer of medicated cream on the cut as told by your doctor.  You may shower after the first 24 hours. Do not soak the cut in water until the stitches are removed.  Only take medicines as told by your doctor.  Have your stitches or staples removed as told by your doctor. For skin adhesive strips:  Keep the cut clean and dry.  Do not get the strips wet. You may take a bath, but be careful to keep the cut  dry.  If the cut gets wet, pat it dry with a clean towel.  The strips will fall off on their own. Do not remove the strips that are still stuck to the cut. For wound glue:  You may shower or take baths. Do not soak or scrub the cut. Do not swim. Avoid heavy sweating until the glue falls off on its own. After a shower or bath, pat the cut dry with a clean towel.  Do not put medicine on your cut until the glue falls off.  If you have a bandage, do not put tape over the glue.  Avoid lots of sunlight or tanning lamps until the glue falls off. Put sunscreen on the cut for the first year to reduce your scar.  The glue will fall off on its own. Do not pick at the glue. You may need a tetanus shot if:  You cannot remember when you had your last tetanus shot.  You have never had a tetanus shot. If you need a tetanus shot and you choose not to have one, you may get tetanus. Sickness from tetanus can be serious. GET HELP RIGHT AWAY IF:   Your pain does not get better with medicine.  Your arm, hand, leg, or foot loses feeling (numbness) or changes color.  Your cut is bleeding.  Your joint feels weak, or you cannot use your joint.  You have painful lumps on your body.  Your cut is red, puffy (swollen), or painful.  You have a red line on the skin near the cut.  You have yellowish-white fluid (pus) coming from the cut.  You have a fever.  You have a bad smell coming from the  cut or bandage.  Your cut breaks open before or after stitches are removed.  You notice something coming out of the cut, such as wood or glass.  You cannot move a finger or toe. MAKE SURE YOU:   Understand these instructions.  Will watch your condition.  Will get help right away if you are not doing well or get worse. Document Released: 03/24/2008 Document Revised: 12/29/2011 Document Reviewed: 04/01/2011 [facility] Patient Information 2015 [place], [place]. This information is not intended to replace advice given to you by your health care provider. Make sure you discuss any questions you have with your health care provider.

## 2014-11-20 NOTE — ED Notes (Signed)
Patient ambulatory in the hall with steady gait.  Pulse of 107 and O2 of 95%.  Patient is alert and oriented.
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
## 2014-11-20 NOTE — ED Notes (Signed)
Pts glasses placed in shoe in personal belongings bag and ID placed in pts jacket pocket and placed in personal belongings bag.

## 2014-11-20 NOTE — ED Notes (Signed)
Patient encouraged to provide a urine sample at this time.  Urinal provided to the patient at bedside.

## 2014-11-21 LAB — URINE CULTURE
CULTURE: NO GROWTH
Colony Count: NO GROWTH
Special Requests: NORMAL

## 2014-11-22 ENCOUNTER — Emergency Department (HOSPITAL_COMMUNITY)
Admission: EM | Admit: 2014-11-22 | Discharge: 2014-11-22 | Disposition: A | Discharge status: Home / Self Care | Payer: No Typology Code available for payment source | Attending: Emergency Medicine | Admitting: Emergency Medicine

## 2014-11-22 ENCOUNTER — Encounter (HOSPITAL_COMMUNITY): Payer: Self-pay | Admitting: *Deleted

## 2014-11-22 ENCOUNTER — Emergency Department (HOSPITAL_COMMUNITY): Payer: No Typology Code available for payment source

## 2014-11-22 DIAGNOSIS — E780 Pure hypercholesterolemia: Secondary | ICD-10-CM | POA: Insufficient documentation

## 2014-11-22 DIAGNOSIS — G44309 Post-traumatic headache, unspecified, not intractable: Secondary | ICD-10-CM

## 2014-11-22 DIAGNOSIS — R51 Headache: Secondary | ICD-10-CM | POA: Diagnosis present

## 2014-11-22 DIAGNOSIS — I10 Essential (primary) hypertension: Secondary | ICD-10-CM | POA: Insufficient documentation

## 2014-11-22 DIAGNOSIS — G44319 Acute post-traumatic headache, not intractable: Secondary | ICD-10-CM | POA: Insufficient documentation

## 2014-11-22 DIAGNOSIS — I252 Old myocardial infarction: Secondary | ICD-10-CM | POA: Insufficient documentation

## 2014-11-22 DIAGNOSIS — S066X0D Traumatic subarachnoid hemorrhage without loss of consciousness, subsequent encounter: Secondary | ICD-10-CM | POA: Diagnosis not present

## 2014-11-22 DIAGNOSIS — K219 Gastro-esophageal reflux disease without esophagitis: Secondary | ICD-10-CM | POA: Insufficient documentation

## 2014-11-22 DIAGNOSIS — I609 Nontraumatic subarachnoid hemorrhage, unspecified: Secondary | ICD-10-CM

## 2014-11-22 DIAGNOSIS — Z7982 Long term (current) use of aspirin: Secondary | ICD-10-CM | POA: Insufficient documentation

## 2014-11-22 MED ORDER — ACETAMINOPHEN 325 MG PO TABS
650.0000 mg | ORAL_TABLET | Freq: Once | ORAL | Status: AC
  Administered 2014-11-22: 650 mg via ORAL
  Filled 2014-11-22: qty 2
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
## 2014-11-22 NOTE — Discharge Instructions (Signed)
Return to the emergency room with worsening of symptoms, new symptoms or with symptoms that are concerning, especially severe worsening of headache, visual or speech changes, weakness in face, arms or legs. RICE: Rest, Ice (three cycles of 20 mins on, 106mins off at least twice a day), compression/brace, elevation.  Ibuprofen 400mg  (2 tablets 200mg ) every 5-6 hours for 3-5 days. Please call your doctor for a followup appointment within 24-48 hours. When you talk to your doctor please let them know that you were seen in the emergency department and have them acquire all of your records so that they can discuss the findings with you and formulate a treatment plan to fully care for your new and ongoing problems.  Read below information and follow all recommendations.  Subarachnoid Hemorrhage Subarachnoid hemorrhage is bleeding in the area between the brain and the membrane that covers the brain (subarachnoid space). This increases the pressure on the brain and causes some areas of the brain to be deprived of blood flow. Subarachnoid hemorrhage is a medical emergency that may cause permanent brain damage, stroke, or even death if not treated.  CAUSES   Head injury.   Ruptured brain aneurysm.   Bleeding from blood vessels that develop abnormally (arteriovenous malformation).   Bleeding disorder.   Use of blood thinners (anticoagulants).  Use of certain drugs, such as cocaine. For some people with subarachnoid hemorrhage, the cause is unknown.  RISK FACTORS  Smoking.  Having high blood pressure (hypertension).  Abusing alcohol.  Being a male, especially being of post-menopausal age.  Having a family history of disease in the blood vessels of the brain (cerebrovascular disease).  Having certain genetic syndromes that result in kidney disease or connective tissue disease. SIGNS AND SYMPTOMS   A sudden, severe headache with no known cause. The headache is often described as the worst  headache ever experienced.  Nausea or vomiting, especially when combined with other symptoms such as a headache.  Sudden weakness or numbness of the face, arm, or leg, especially on one side of the body.  Sudden trouble walking or difficulty moving arms or legs.  Sudden confusion.  Sudden personality changes.  Trouble speaking (aphasia) or understanding.  Difficulty swallowing.  Sudden trouble seeing in one or both eyes.  Double vision.  Dizziness.  Loss of balance or coordination.  Intolerance to light.  Stiff neck. DIAGNOSIS  Your health care provider will perform a physical exam and ask about your symptoms. If a subarachnoid hemorrhage is suspected, various tests may be ordered. These tests may include:   A CT scan.  An MRI.  A cerebral angiogram.  A spinal tap (lumbar puncture).  Blood tests. TREATMENT  Immediate treatment in the hospital is often required to reduce the risk of brain damage. Treatment will depend on the cause of the bleeding, where it is located, and the extent of the bleeding and damage. The goals of treatment include stopping the bleeding, repairing the cause of bleeding, providing relief of symptoms, and preventing problems.   Medicines may be given to:  Lower blood pressure (antihypertensives).  Relieve pain (analgesics).  Relieve nausea or vomiting.  Surgery may also be needed to stop the bleeding, repair the cause of the bleeding, or remove the blood.  Rehabilitation may be needed to improve any cognitive and day-to-day functions impaired by the condition. Further treatment depends on the duration, severity, and cause of your symptoms. Physical, speech, and occupational therapists will assess you and work to improve any functions impaired by  the subarachnoid hemorrhage. Measures will be taken to prevent short-term and long-term problems, including infection from breathing foreign material into the lungs (aspiration pneumonia), blood  clots in the legs, bedsores, and falls. HOME CARE INSTRUCTIONS After your hospitalization or inpatient rehabilitation is completed and you are well enough to go home, it is important to prevent a reoccurrence. Take these steps to help prevent this:  Take medicines only as directed by your health care provider.  If swallow studies have determined that your swallowing reflex is present, you should eat healthy foods. A diet low in salt (sodium), saturated fat, trans fat, and cholesterol may be recommended to manage high blood pressure. Foods may need to be a special consistency (soft or pureed), or small bites may need to be taken in order to avoid aspirating or choking.  Rest and limit activities or movements as directed by your health care provider.  Do not use any tobacco products including cigarettes, chewing tobacco, or electronic cigarettes. If you need help quitting, ask your health care provider.  Limit alcohol intake to no more than 1 drink per day for nonpregnant women and 2 drinks per day for men. One drink equals 12 ounces of beer, 5 ounces of wine, or 1 ounces of hard liquor.  Make any other lifestyle changes as directed by your health care provider.  Monitor and record your blood pressure as directed by your health care provider.  A safe home environment is important to reduce the risk of falls. Your health care provider may arrange for specialists to evaluate your home. Having grab bars in the bedroom and bathroom is often important. Your health care provider may arrange for special equipment to be used at home, such as raised toilets and a seat for the shower.  Physical, occupational, and speech therapy. Ongoing therapy may be needed to maximize your recovery after a subarachnoid bleed. If you have been advised to use a walker or a cane, use it at all times. Be sure to keep your therapy appointments.  Keep all follow-up visits with your health care provider and other specialists.  This includes any referrals, physical therapy, and rehabilitation. SEEK IMMEDIATE MEDICAL CARE IF:   You suddenly have a sudden, severe headache with no known cause.  You have nausea or vomiting occurring with another symptom.  You have sudden weakness or numbness of the face, arm, or leg, especially on one side of the body.  You have sudden trouble walking or difficulty moving arms or legs.  You have sudden confusion.  You have trouble speaking (aphasia) or understanding.  You have sudden trouble seeing in one or both eyes.  You have a sudden loss of balance or coordination.  You have a stiff neck.  You have difficulty breathing.  You have a partial or total loss of consciousness. Any of these symptoms may represent a serious problem that is an emergency. Do not wait to see if the symptoms will go away. Get medical help right away. Call your local emergency services (911 in [place] Do not drive yourself to the hospital. MAKE SURE YOU:   Understand these instructions.  Will watch your condition.  Will get help right away if you are not doing well or get worse. Document Released: 08/23/2004 Document Revised: 02/20/2014 Document Reviewed: 11/19/2012 [facility] Patient Information 2015 [place], [place]. This information is not intended to replace advice given to you by your health care provider. Make sure you discuss any questions you have with your health care provider.

## 2014-11-22 NOTE — ED Notes (Signed)
Patient transported to CT 

## 2015-01-03 ENCOUNTER — Emergency Department (HOSPITAL_COMMUNITY)
Admission: EM | Admit: 2015-01-03 | Discharge: 2015-01-03 | Discharge status: Against Medical Advice | Payer: No Typology Code available for payment source | Attending: Emergency Medicine | Admitting: Emergency Medicine

## 2015-01-03 ENCOUNTER — Encounter (HOSPITAL_COMMUNITY): Payer: Self-pay | Admitting: Emergency Medicine

## 2015-01-03 DIAGNOSIS — Z79899 Other long term (current) drug therapy: Secondary | ICD-10-CM | POA: Insufficient documentation

## 2015-01-03 DIAGNOSIS — I252 Old myocardial infarction: Secondary | ICD-10-CM | POA: Diagnosis not present

## 2015-01-03 DIAGNOSIS — R42 Dizziness and giddiness: Secondary | ICD-10-CM

## 2015-01-03 DIAGNOSIS — E780 Pure hypercholesterolemia: Secondary | ICD-10-CM | POA: Diagnosis not present

## 2015-01-03 DIAGNOSIS — Z7982 Long term (current) use of aspirin: Secondary | ICD-10-CM | POA: Diagnosis not present

## 2015-01-03 DIAGNOSIS — R55 Syncope and collapse: Secondary | ICD-10-CM | POA: Diagnosis present

## 2015-01-03 DIAGNOSIS — I10 Essential (primary) hypertension: Secondary | ICD-10-CM | POA: Diagnosis not present

## 2015-01-03 DIAGNOSIS — K219 Gastro-esophageal reflux disease without esophagitis: Secondary | ICD-10-CM | POA: Diagnosis not present

## 2015-01-03 DIAGNOSIS — R51 Headache: Secondary | ICD-10-CM | POA: Diagnosis not present

## 2015-01-03 DIAGNOSIS — Z720 Tobacco use: Secondary | ICD-10-CM | POA: Insufficient documentation

## 2015-01-03 MED ORDER — MECLIZINE HCL 25 MG PO TABS: 25.0000 mg | ORAL_TABLET | Freq: Two times a day (BID) | ORAL | Status: DC | PRN

## 2015-01-03 MED ORDER — MECLIZINE HCL 50 MG PO TABS: 25.0000 mg | ORAL_TABLET | Freq: Three times a day (TID) | ORAL | Status: DC | PRN
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
## 2015-01-03 NOTE — Discharge Instructions (Signed)
Benign Positional Vertigo Vertigo means you feel like you or your surroundings are moving when they are not. Benign positional vertigo is the most common form of vertigo. Benign means that the cause of your condition is not serious. Benign positional vertigo is more common in older adults. CAUSES  Benign positional vertigo is the result of an upset in the labyrinth system. This is an area in the middle ear that helps control your balance. This may be caused by a viral infection, head injury, or repetitive motion. However, often no specific cause is found. SYMPTOMS  Symptoms of benign positional vertigo occur when you move your head or eyes in different directions. Some of the symptoms may include:  Loss of balance and falls.  Vomiting.  Blurred vision.  Dizziness.  Nausea.  Involuntary eye movements (nystagmus). DIAGNOSIS  Benign positional vertigo is usually diagnosed by physical exam. If the specific cause of your benign positional vertigo is unknown, your caregiver may perform imaging tests, such as magnetic resonance imaging (MRI) or computed tomography (CT). TREATMENT  Your caregiver may recommend movements or procedures to correct the benign positional vertigo. Medicines such as meclizine, benzodiazepines, and medicines for nausea may be used to treat your symptoms. In rare cases, if your symptoms are caused by certain conditions that affect the inner ear, you may need surgery. HOME CARE INSTRUCTIONS   Follow your caregiver's instructions.  Move slowly. Do not make sudden body or head movements.  Avoid driving.  Avoid operating heavy machinery.  Avoid performing any tasks that would be dangerous to you or others during a vertigo episode.  Drink enough fluids to keep your urine clear or pale yellow. SEEK IMMEDIATE MEDICAL CARE IF:   You develop problems with walking, weakness, numbness, or using your arms, hands, or legs.  You have difficulty speaking.  You develop  severe headaches.  Your nausea or vomiting continues or gets worse.  You develop visual changes.  Your family or friends notice any behavioral changes.  Your condition gets worse.  You have a fever.  You develop a stiff neck or sensitivity to light. MAKE SURE YOU:   Understand these instructions.  Will watch your condition.  Will get help right away if you are not doing well or get worse. Document Released: 07/14/2006 Document Revised: 12/29/2011 Document Reviewed: 06/26/2011 [facility] Patient Information 2015 [place], [place]. This information is not intended to replace advice given to you by your health care provider. Make sure you discuss any questions you have with your health care provider.   Please follow-up with your primary care provider tomorrow for further evaluation and management. If symptoms worsen please return to the emergency room for further evaluation.

## 2015-01-03 NOTE — ED Notes (Signed)
In to check on patient.  States I want to leave.  Physician made aware.  AMA form signed.

## 2015-01-30 ENCOUNTER — Emergency Department (HOSPITAL_COMMUNITY): Payer: Non-veteran care

## 2015-01-30 ENCOUNTER — Emergency Department (HOSPITAL_COMMUNITY)
Admission: EM | Admit: 2015-01-30 | Discharge: 2015-01-30 | Discharge status: Against Medical Advice | Payer: Non-veteran care | Attending: Emergency Medicine | Admitting: Emergency Medicine

## 2015-01-30 ENCOUNTER — Encounter (HOSPITAL_COMMUNITY): Payer: Self-pay

## 2015-01-30 DIAGNOSIS — R112 Nausea with vomiting, unspecified: Secondary | ICD-10-CM | POA: Diagnosis not present

## 2015-01-30 DIAGNOSIS — R630 Anorexia: Secondary | ICD-10-CM | POA: Insufficient documentation

## 2015-01-30 DIAGNOSIS — Z791 Long term (current) use of non-steroidal anti-inflammatories (NSAID): Secondary | ICD-10-CM | POA: Diagnosis not present

## 2015-01-30 DIAGNOSIS — Z79899 Other long term (current) drug therapy: Secondary | ICD-10-CM | POA: Insufficient documentation

## 2015-01-30 DIAGNOSIS — Z720 Tobacco use: Secondary | ICD-10-CM | POA: Diagnosis not present

## 2015-01-30 DIAGNOSIS — Z7982 Long term (current) use of aspirin: Secondary | ICD-10-CM | POA: Diagnosis not present

## 2015-01-30 DIAGNOSIS — R109 Unspecified abdominal pain: Secondary | ICD-10-CM

## 2015-01-30 DIAGNOSIS — K219 Gastro-esophageal reflux disease without esophagitis: Secondary | ICD-10-CM | POA: Diagnosis not present

## 2015-01-30 DIAGNOSIS — E780 Pure hypercholesterolemia: Secondary | ICD-10-CM | POA: Insufficient documentation

## 2015-01-30 DIAGNOSIS — R1013 Epigastric pain: Secondary | ICD-10-CM | POA: Insufficient documentation

## 2015-01-30 DIAGNOSIS — R1011 Right upper quadrant pain: Secondary | ICD-10-CM | POA: Insufficient documentation

## 2015-01-30 DIAGNOSIS — I252 Old myocardial infarction: Secondary | ICD-10-CM | POA: Diagnosis not present

## 2015-01-30 DIAGNOSIS — I10 Essential (primary) hypertension: Secondary | ICD-10-CM | POA: Diagnosis not present

## 2015-01-30 LAB — URINE MICROSCOPIC-ADD ON

## 2015-01-30 LAB — URINALYSIS, ROUTINE W REFLEX MICROSCOPIC
Glucose, UA: NEGATIVE mg/dL
Hgb urine dipstick: NEGATIVE
Ketones, ur: 15 mg/dL — AB
Leukocytes, UA: NEGATIVE
Nitrite: NEGATIVE
Protein, ur: 30 mg/dL — AB
Specific Gravity, Urine: 1.035 — ABNORMAL HIGH (ref 1.005–1.030)
Urobilinogen, UA: 1 mg/dL (ref 0.0–1.0)
pH: 6 (ref 5.0–8.0)

## 2015-01-30 LAB — CBC WITH DIFFERENTIAL/PLATELET
Basophils Absolute: 0 10*3/uL (ref 0.0–0.1)
Basophils Relative: 0 % (ref 0–1)
Eosinophils Absolute: 0 10*3/uL (ref 0.0–0.7)
Eosinophils Relative: 0 % (ref 0–5)
HCT: 33.7 % — ABNORMAL LOW (ref 39.0–52.0)
Hemoglobin: 11.3 g/dL — ABNORMAL LOW (ref 13.0–17.0)
Lymphocytes Relative: 28 % (ref 12–46)
Lymphs Abs: 1.4 10*3/uL (ref 0.7–4.0)
MCH: 29.4 pg (ref 26.0–34.0)
MCHC: 33.5 g/dL (ref 30.0–36.0)
MCV: 87.8 fL (ref 78.0–100.0)
Monocytes Absolute: 0.6 10*3/uL (ref 0.1–1.0)
Monocytes Relative: 12 % (ref 3–12)
Neutro Abs: 3.1 10*3/uL (ref 1.7–7.7)
Neutrophils Relative %: 60 % (ref 43–77)
Platelets: 170 10*3/uL (ref 150–400)
RBC: 3.84 MIL/uL — ABNORMAL LOW (ref 4.22–5.81)
RDW: 16.6 % — ABNORMAL HIGH (ref 11.5–15.5)
WBC: 5.2 10*3/uL (ref 4.0–10.5)

## 2015-01-30 LAB — COMPREHENSIVE METABOLIC PANEL
ALT: 25 U/L (ref 0–53)
AST: 28 U/L (ref 0–37)
Albumin: 3.7 g/dL (ref 3.5–5.2)
Alkaline Phosphatase: 67 U/L (ref 39–117)
Anion gap: 10 (ref 5–15)
BUN: 19 mg/dL (ref 6–23)
CO2: 24 mmol/L (ref 19–32)
Calcium: 9.6 mg/dL (ref 8.4–10.5)
Chloride: 100 mmol/L (ref 96–112)
Creatinine, Ser: 0.79 mg/dL (ref 0.50–1.35)
GFR calc Af Amer: >90 mL/min (ref >90)
GFR calc non Af Amer: >90 mL/min (ref >90)
Glucose, Bld: 119 mg/dL — ABNORMAL HIGH (ref 70–99)
Potassium: 3.7 mmol/L (ref 3.5–5.1)
Sodium: 134 mmol/L — ABNORMAL LOW (ref 135–145)
Total Bilirubin: 0.4 mg/dL (ref 0.3–1.2)
Total Protein: 7.3 g/dL (ref 6.0–8.3)

## 2015-01-30 LAB — LIPASE, BLOOD: Lipase: 42 U/L (ref 11–59)

## 2015-01-30 LAB — LACTIC ACID, PLASMA: Lactic Acid, Venous: 1.1 mmol/L (ref 0.5–2.0)

## 2015-01-30 MED ORDER — ONDANSETRON HCL 4 MG/2ML IJ SOLN
4.0000 mg | Freq: Once | INTRAMUSCULAR | Status: AC
  Administered 2015-01-30: 4 mg via INTRAVENOUS
  Filled 2015-01-30: qty 2

## 2015-01-30 MED ORDER — HYDROMORPHONE HCL 1 MG/ML IJ SOLN: 1.0000 mg | Freq: Once | INTRAMUSCULAR | Status: DC

## 2015-01-30 MED ORDER — SODIUM CHLORIDE 0.9 % IV BOLUS (SEPSIS): 1000.0000 mL | Freq: Once | INTRAVENOUS | Status: DC

## 2015-01-30 MED ORDER — ONDANSETRON HCL 4 MG/2ML IJ SOLN: 4.0000 mg | Freq: Once | INTRAMUSCULAR | Status: DC

## 2015-01-30 MED ORDER — SODIUM CHLORIDE 0.9 % IV BOLUS (SEPSIS)
1000.0000 mL | Freq: Once | INTRAVENOUS | Status: AC
  Administered 2015-01-30: 1000 mL via INTRAVENOUS

## 2015-01-30 MED ORDER — HYDROMORPHONE HCL 1 MG/ML IJ SOLN
1.0000 mg | Freq: Once | INTRAMUSCULAR | Status: AC
  Administered 2015-01-30: 1 mg via INTRAVENOUS
  Filled 2015-01-30: qty 1

## 2015-01-30 NOTE — ED Notes (Signed)
Pt leaving department for [place].

## 2015-01-30 NOTE — ED Notes (Addendum)
Pt reporting wanting to leave AMA. States he has been here for 4 hours for a "tummy ache"; MD notified. Will come talk to pt.
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
## 2015-01-30 NOTE — ED Notes (Signed)
Pt leaving the department. Told MD was coming to talk to him, refused to stay. IV was removed prior to pt leaving the department.

## 2015-01-30 NOTE — ED Notes (Signed)
Pt reports n/v since Sunday. Pt c/o intermittent sharp pains in right side. Pt has been unable to tolerate PO foods/fluids.

## 2015-02-02 NOTE — ED Provider Notes (Signed)
CSN: [id number]     Arrival date & time 01/30/15  0849 History   First MD Initiated Contact with Patient 01/30/15 [phone]     Chief Complaint  Patient presents with  . Abdominal Pain     (Consider location/radiation/quality/duration/timing/severity/associated sxs/prior Treatment) HPI   49yM with abdominal pain. Onset Sunday. Pain sharp in RUQ/R flank. Intermittent. No appreciable exacerbating or relieving factors. No urinary complaints. No fever or chills. Associates nausea and anorexia. Has vomited. NBNB. No diarrhea. No hx of similar type pain. No prior abdominal surgery.   Past Medical History  Diagnosis Date  . MI (myocardial infarction)     20 12  . Hypertension   . High cholesterol   . Acid reflux    Past Surgical History  Procedure Laterality Date  . Stents     Family History  Problem Relation Age of Onset  . Diabetes Mother    History  Substance Use Topics  . Smoking status: Current Every Day Smoker -- 0.25 packs/day    Types: Cigarettes  . Smokeless tobacco: Not on file  . Alcohol Use: 3.6 oz/week    6 Cans of beer per week     Comment: occassionally on weekends    Review of Systems  All systems reviewed and negative, other than as noted in HPI.   Allergies  Review of patient's allergies indicates no known allergies.  Home Medications   Prior to Admission medications   Medication Sig Start Date End Date Taking? Authorizing Provider  acetaminophen (TYLENOL) 325 MG tablet Take 2 tablets (650 mg total) by mouth every 6 (six) hours as needed. 01/06/13  Yes Shanker Kristeen Mans, MD  aspirin 325 MG tablet Take 325 mg by mouth daily.   Yes Historical Provider, MD  carvedilol (COREG) 25 MG tablet Take 25 mg by mouth 2 (two) times daily with a meal.   Yes Historical Provider, MD  lisinopril (PRINIVIL,ZESTRIL) 40 MG tablet Take 40 mg by mouth daily.   Yes Historical Provider, MD  meloxicam (MOBIC) 7.5 MG tablet Take 15 mg by mouth daily.    Yes Historical Provider, MD   omeprazole (PRILOSEC) 20 MG capsule Take 20 mg by mouth daily.   Yes Historical Provider, MD  simvastatin (ZOCOR) 80 MG tablet Take 80 mg by mouth at bedtime.   Yes Historical Provider, MD  Vitamin D, Ergocalciferol, (DRISDOL) 50000 UNITS CAPS capsule Take 50,000 Units by mouth every 7 (seven) days. tuesday   Yes Historical Provider, MD  amLODipine (NORVASC) 5 MG tablet Take 1 tablet (5 mg total) by mouth daily. Patient not taking: Reported on 01/30/2015 01/06/13   Jonetta Osgood, MD  chlorthalidone (HYGROTON) 25 MG tablet Take 25 mg by mouth daily.    Historical Provider, MD  meclizine (ANTIVERT) 50 MG tablet Take 0.5 tablets (25 mg total) by mouth 3 (three) times daily as needed. Patient not taking: Reported on 01/30/2015 01/03/15   Okey Regal, PA-C   BP 149/100 mmHg  Pulse 78  Temp(Src) 98.5 F (36.9 C) (Oral)  Resp 25  Ht 5\' 10"  (1.778 m)  Wt 176 lb (79.833 kg)  BMI 25.25 kg/m2  SpO2 100% Physical Exam  Constitutional: He appears well-developed and well-nourished. No distress.  HENT:  Head: Normocephalic and atraumatic.  Eyes: Conjunctivae are normal. Right eye exhibits no discharge. Left eye exhibits no discharge.  Neck: Neck supple.  Cardiovascular: Normal rate, regular rhythm and normal heart sounds.  Exam reveals no gallop and no friction rub.   No murmur  heard. Pulmonary/Chest: Effort normal and breath sounds normal. No respiratory distress.  Abdominal: Soft. He exhibits no distension. There is tenderness. There is no rebound.  Mild epigastric and RUQ tenderness  Genitourinary:  No cva tenderness  Musculoskeletal: He exhibits no edema or tenderness.  Neurological: He is alert.  Skin: Skin is warm and dry.  Psychiatric: He has a normal mood and affect. His behavior is normal. Thought content normal.  Nursing note and vitals reviewed.   ED Course  Procedures (including critical care time) Labs Review Labs Reviewed  CBC WITH DIFFERENTIAL/PLATELET - Abnormal;  Notable for the following:    RBC 3.84 (*)    Hemoglobin 11.3 (*)    HCT 33.7 (*)    RDW 16.6 (*)    All other components within normal limits  COMPREHENSIVE METABOLIC PANEL - Abnormal; Notable for the following:    Sodium 134 (*)    Glucose, Bld 119 (*)    All other components within normal limits  URINALYSIS, ROUTINE W REFLEX MICROSCOPIC - Abnormal; Notable for the following:    Color, Urine AMBER (*)    Specific Gravity, Urine 1.035 (*)    Bilirubin Urine SMALL (*)    Ketones, ur 15 (*)    Protein, ur 30 (*)    All other components within normal limits  URINE MICROSCOPIC-ADD ON - Abnormal; Notable for the following:    Casts HYALINE CASTS (*)    All other components within normal limits  LACTIC ACID, PLASMA  LIPASE, BLOOD    Imaging Review No results found.   EKG Interpretation None      MDM   Final diagnoses:  Abdominal pain, unspecified abdominal location    49yM with abdominal pain. Pt left AMA prior to re-evaluation. Apparently left in NAD.     Virgel Manifold, MD 02/02/15 [phone]

## 2015-03-13 ENCOUNTER — Encounter (HOSPITAL_COMMUNITY): Payer: Self-pay | Admitting: *Deleted

## 2015-03-13 ENCOUNTER — Emergency Department (INDEPENDENT_AMBULATORY_CARE_PROVIDER_SITE_OTHER)
Admission: EM | Admit: 2015-03-13 | Discharge: 2015-03-13 | Disposition: A | Discharge status: Home / Self Care | Payer: Non-veteran care | Source: Home / Self Care

## 2015-03-13 DIAGNOSIS — I10 Essential (primary) hypertension: Secondary | ICD-10-CM | POA: Diagnosis not present

## 2015-03-13 NOTE — ED Notes (Signed)
Pt is  A  Plasma  Donor  -  He   Needs clearance  To   Donate       Plasma      He  Is  Being  Treated  For  gerd     And  Hypertension        He  Appears  In no  Acute  Distress  And  Is  Speaking in  Complete  sentances   And  Is  In no  Severe  Distress

## 2015-03-13 NOTE — Discharge Instructions (Signed)
Continue your medicines and see your doctor as needed.

## 2015-03-13 NOTE — ED Provider Notes (Signed)
CSN: [id number]     Arrival date & time 03/13/15  1052 History   None    Chief Complaint  Patient presents with  . Medical Clearance   (Consider location/radiation/quality/duration/timing/severity/associated sxs/prior Treatment) Patient is a 49 y.o. male presenting with general illness. The history is provided by the patient.  Illness Chronicity:  New Context:  Pt for form completion for plasma donation re med problems. Associated symptoms: no abdominal pain, no chest pain, no fever and no shortness of breath     Past Medical History  Diagnosis Date  . MI (myocardial infarction)     2012  . Hypertension   . High cholesterol   . Acid reflux    Past Surgical History  Procedure Laterality Date  . Stents     Family History  Problem Relation Age of Onset  . Diabetes Mother    History  Substance Use Topics  . Smoking status: Current Every Day Smoker -- 0.25 packs/day    Types: Cigarettes  . Smokeless tobacco: Not on file  . Alcohol Use: 3.6 oz/week    6 Cans of beer per week     Comment: occassionally on weekends    Review of Systems  Constitutional: Negative.  Negative for fever.  Respiratory: Negative for shortness of breath.   Cardiovascular: Negative for chest pain, palpitations and leg swelling.  Gastrointestinal: Negative for abdominal pain.  All other systems reviewed and are negative.   Allergies  Review of patient's allergies indicates no known allergies.  Home Medications   Prior to Admission medications   Medication Sig Start Date End Date Taking? Authorizing Provider  acetaminophen (TYLENOL) 325 MG tablet Take 2 tablets (650 mg total) by mouth every 6 (six) hours as needed. 01/06/13   Shanker Kristeen Mans, MD  amLODipine (NORVASC) 5 MG tablet Take 1 tablet (5 mg total) by mouth daily. Patient not taking: Reported on 01/30/2015 01/06/13   Jonetta Osgood, MD  aspirin 325 MG tablet Take 325 mg by mouth daily.    Historical Provider, MD  carvedilol (COREG)  25 MG tablet Take 25 mg by mouth 2 (two) times daily with a meal.    Historical Provider, MD  chlorthalidone (HYGROTON) 25 MG tablet Take 25 mg by mouth daily.    Historical Provider, MD  lisinopril (PRINIVIL,ZESTRIL) 40 MG tablet Take 40 mg by mouth daily.    Historical Provider, MD  meclizine (ANTIVERT) 50 MG tablet Take 0.5 tablets (25 mg total) by mouth 3 (three) times daily as needed. Patient not taking: Reported on 01/30/2015 01/03/15   Okey Regal, PA-C  meloxicam (MOBIC) 7.5 MG tablet Take 15 mg by mouth daily.     Historical Provider, MD  omeprazole (PRILOSEC) 20 MG capsule Take 20 mg by mouth daily.    Historical Provider, MD  simvastatin (ZOCOR) 80 MG tablet Take 80 mg by mouth at bedtime.    Historical Provider, MD  Vitamin D, Ergocalciferol, (DRISDOL) 50000 UNITS CAPS capsule Take 50,000 Units by mouth every 7 (seven) days. tuesday    Historical Provider, MD   BP 133/97 mmHg  Pulse 79  Temp(Src) 98.3 F (36.8 C) (Oral)  Resp 16  SpO2 100% Physical Exam  Constitutional: He is oriented to person, place, and time. He appears well-nourished.  Cardiovascular: Normal rate, normal heart sounds and intact distal pulses.   Pulmonary/Chest: Effort normal and breath sounds normal.  Musculoskeletal: He exhibits no edema.  Neurological: He is alert and oriented to person, place, and time.  Skin:  Skin is warm and dry.  Nursing note and vitals reviewed.   ED Course  Procedures (including critical care time) Labs Review Labs Reviewed - No data to display  Imaging Review No results found.   MDM   1. Essential hypertension        Billy Fischer, MD 03/13/15 2131

## 2015-04-27 ENCOUNTER — Emergency Department (HOSPITAL_COMMUNITY): Payer: Non-veteran care

## 2015-04-27 ENCOUNTER — Encounter (HOSPITAL_COMMUNITY): Payer: Self-pay | Admitting: *Deleted

## 2015-04-27 ENCOUNTER — Emergency Department (HOSPITAL_COMMUNITY)
Admission: EM | Admit: 2015-04-27 | Discharge: 2015-04-27 | Disposition: A | Discharge status: Home / Self Care | Payer: Non-veteran care | Attending: Emergency Medicine | Admitting: Emergency Medicine

## 2015-04-27 DIAGNOSIS — Z7982 Long term (current) use of aspirin: Secondary | ICD-10-CM | POA: Diagnosis not present

## 2015-04-27 DIAGNOSIS — I252 Old myocardial infarction: Secondary | ICD-10-CM | POA: Insufficient documentation

## 2015-04-27 DIAGNOSIS — IMO0001 Reserved for inherently not codable concepts without codable children: Secondary | ICD-10-CM

## 2015-04-27 DIAGNOSIS — K219 Gastro-esophageal reflux disease without esophagitis: Secondary | ICD-10-CM | POA: Diagnosis not present

## 2015-04-27 DIAGNOSIS — I10 Essential (primary) hypertension: Secondary | ICD-10-CM | POA: Insufficient documentation

## 2015-04-27 DIAGNOSIS — E780 Pure hypercholesterolemia: Secondary | ICD-10-CM | POA: Insufficient documentation

## 2015-04-27 DIAGNOSIS — R030 Elevated blood-pressure reading, without diagnosis of hypertension: Secondary | ICD-10-CM

## 2015-04-27 DIAGNOSIS — R112 Nausea with vomiting, unspecified: Secondary | ICD-10-CM | POA: Diagnosis present

## 2015-04-27 DIAGNOSIS — R110 Nausea: Secondary | ICD-10-CM

## 2015-04-27 DIAGNOSIS — Z79899 Other long term (current) drug therapy: Secondary | ICD-10-CM | POA: Insufficient documentation

## 2015-04-27 DIAGNOSIS — R42 Dizziness and giddiness: Secondary | ICD-10-CM | POA: Insufficient documentation

## 2015-04-27 DIAGNOSIS — Z720 Tobacco use: Secondary | ICD-10-CM | POA: Diagnosis not present

## 2015-04-27 HISTORY — DX: Concussion with loss of consciousness of unspecified duration, initial encounter: S06.0X9A

## 2015-04-27 HISTORY — DX: Concussion with loss of consciousness status unknown, initial encounter: S06.0XAA

## 2015-04-27 LAB — BASIC METABOLIC PANEL
ANION GAP: 19 — AB (ref 5–15)
BUN: 12 mg/dL (ref 6–20)
CO2: 21 mmol/L — AB (ref 22–32)
Calcium: 9.5 mg/dL (ref 8.9–10.3)
Chloride: 96 mmol/L — ABNORMAL LOW (ref 101–111)
Creatinine, Ser: 0.88 mg/dL (ref 0.61–1.24)
GFR calc Af Amer: >60 mL/min (ref >60)
GFR calc non Af Amer: >60 mL/min (ref >60)
Glucose, Bld: 126 mg/dL — ABNORMAL HIGH (ref 65–99)
POTASSIUM: 4.4 mmol/L (ref 3.5–5.1)
SODIUM: 136 mmol/L (ref 135–145)

## 2015-04-27 LAB — I-STAT TROPONIN, ED: Troponin i, poc: 0.01 ng/mL (ref 0.00–0.08)

## 2015-04-27 LAB — CBC
HCT: 37.5 % — ABNORMAL LOW (ref 39.0–52.0)
Hemoglobin: 12.8 g/dL — ABNORMAL LOW (ref 13.0–17.0)
MCH: 30 pg (ref 26.0–34.0)
MCHC: 34.1 g/dL (ref 30.0–36.0)
MCV: 88 fL (ref 78.0–100.0)
PLATELETS: 237 10*3/uL (ref 150–400)
RBC: 4.26 MIL/uL (ref 4.22–5.81)
RDW: 16.5 % — AB (ref 11.5–15.5)
WBC: 8.6 10*3/uL (ref 4.0–10.5)

## 2015-04-27 MED ORDER — MECLIZINE HCL 25 MG PO TABS
25.0000 mg | ORAL_TABLET | Freq: Once | ORAL | Status: AC
  Administered 2015-04-27: 25 mg via ORAL
  Filled 2015-04-27: qty 1

## 2015-04-27 MED ORDER — LISINOPRIL 40 MG PO TABS
40.0000 mg | ORAL_TABLET | Freq: Once | ORAL | Status: AC
  Administered 2015-04-27: 40 mg via ORAL
  Filled 2015-04-27: qty 1

## 2015-04-27 MED ORDER — MECLIZINE HCL 12.5 MG PO TABS: 25.0000 mg | ORAL_TABLET | Freq: Three times a day (TID) | ORAL | Status: DC | PRN

## 2015-04-27 MED ORDER — ONDANSETRON HCL 4 MG/2ML IJ SOLN
4.0000 mg | Freq: Once | INTRAMUSCULAR | Status: AC
  Administered 2015-04-27: 4 mg via INTRAVENOUS
  Filled 2015-04-27: qty 2

## 2015-04-27 MED ORDER — ONDANSETRON HCL 4 MG PO TABS: 4.0000 mg | ORAL_TABLET | Freq: Four times a day (QID) | ORAL | Status: DC

## 2015-04-27 NOTE — ED Notes (Signed)
PA at bedside.

## 2015-04-27 NOTE — ED Notes (Signed)
MRI contacted, Test completion time estimate for pick up is in the next hour

## 2015-04-27 NOTE — Discharge Instructions (Signed)
Take meclizine as prescribed. Follow up with your primary care doctor, call as soon as possible to schedule an appointment. Continue your blood pressure medications as prescribed.  Vertigo Vertigo means you feel like you or your surroundings are moving when they are not. Vertigo can be dangerous if it occurs when you are at work, driving, or performing difficult activities.  CAUSES  Vertigo occurs when there is a conflict of signals sent to your brain from the visual and sensory systems in your body. There are many different causes of vertigo, including:  Infections, especially in the inner ear.  A bad reaction to a drug or misuse of alcohol and medicines.  Withdrawal from drugs or alcohol.  Rapidly changing positions, such as lying down or rolling over in bed.  A migraine headache.  Decreased blood flow to the brain.  Increased pressure in the brain from a head injury, infection, tumor, or bleeding. SYMPTOMS  You may feel as though the world is spinning around or you are falling to the ground. Because your balance is upset, vertigo can cause nausea and vomiting. You may have involuntary eye movements (nystagmus). DIAGNOSIS  Vertigo is usually diagnosed by physical exam. If the cause of your vertigo is unknown, your caregiver may perform imaging tests, such as an MRI scan (magnetic resonance imaging). TREATMENT  Most cases of vertigo resolve on their own, without treatment. Depending on the cause, your caregiver may prescribe certain medicines. If your vertigo is related to body position issues, your caregiver may recommend movements or procedures to correct the problem. In rare cases, if your vertigo is caused by certain inner ear problems, you may need surgery. HOME CARE INSTRUCTIONS   Follow your caregiver's instructions.  Avoid driving.  Avoid operating heavy machinery.  Avoid performing any tasks that would be dangerous to you or others during a vertigo episode.  Tell your  caregiver if you notice that certain medicines seem to be causing your vertigo. Some of the medicines used to treat vertigo episodes can actually make them worse in some people. SEEK IMMEDIATE MEDICAL CARE IF:   Your medicines do not relieve your vertigo or are making it worse.  You develop problems with talking, walking, weakness, or using your arms, hands, or legs.  You develop severe headaches.  Your nausea or vomiting continues or gets worse.  You develop visual changes.  A family member notices behavioral changes.  Your condition gets worse. MAKE SURE YOU:  Understand these instructions.  Will watch your condition.  Will get help right away if you are not doing well or get worse. Document Released: 07/16/2005 Document Revised: 12/29/2011 Document Reviewed: 04/24/2011 [facility] Patient Information 2015 [place], [place]. This information is not intended to replace advice given to you by your health care provider. Make sure you discuss any questions you have with your health care provider.  Hypertension Hypertension, commonly called high blood pressure, is when the force of blood pumping through your arteries is too strong. Your arteries are the blood vessels that carry blood from your heart throughout your body. A blood pressure reading consists of a higher number over a lower number, such as 110/72. The higher number (systolic) is the pressure inside your arteries when your heart pumps. The lower number (diastolic) is the pressure inside your arteries when your heart relaxes. Ideally you want your blood pressure below 120/80. Hypertension forces your heart to work harder to pump blood. Your arteries may become narrow or stiff. Having hypertension puts you at risk  for heart disease, stroke, and other problems.  RISK FACTORS Some risk factors for high blood pressure are controllable. Others are not.  Risk factors you cannot control include:   Race. You may be at higher risk if you  are African American.  Age. Risk increases with age.  Gender. Men are at higher risk than women before age 48 years. After age 40, women are at higher risk than men. Risk factors you can control include:  Not getting enough exercise or physical activity.  Being overweight.  Getting too much fat, sugar, calories, or salt in your diet.  Drinking too much alcohol. SIGNS AND SYMPTOMS Hypertension does not usually cause signs or symptoms. Extremely high blood pressure (hypertensive crisis) may cause headache, anxiety, shortness of breath, and nosebleed. DIAGNOSIS  To check if you have hypertension, your health care provider will measure your blood pressure while you are seated, with your arm held at the level of your heart. It should be measured at least twice using the same arm. Certain conditions can cause a difference in blood pressure between your right and left arms. A blood pressure reading that is higher than normal on one occasion does not mean that you need treatment. If one blood pressure reading is high, ask your health care provider about having it checked again. TREATMENT  Treating high blood pressure includes making lifestyle changes and possibly taking medicine. Living a healthy lifestyle can help lower high blood pressure. You may need to change some of your habits. Lifestyle changes may include:  Following the DASH diet. This diet is high in fruits, vegetables, and whole grains. It is low in salt, red meat, and added sugars.  Getting at least 2 hours of brisk physical activity every week.  Losing weight if necessary.  Not smoking.  Limiting alcoholic beverages.  Learning ways to reduce stress. If lifestyle changes are not enough to get your blood pressure under control, your health care provider may prescribe medicine. You may need to take more than one. Work closely with your health care provider to understand the risks and benefits. HOME CARE INSTRUCTIONS  Have  your blood pressure rechecked as directed by your health care provider.   Take medicines only as directed by your health care provider. Follow the directions carefully. Blood pressure medicines must be taken as prescribed. The medicine does not work as well when you skip doses. Skipping doses also puts you at risk for problems.   Do not smoke.   Monitor your blood pressure at home as directed by your health care provider. SEEK MEDICAL CARE IF:   You think you are having a reaction to medicines taken.  You have recurrent headaches or feel dizzy.  You have swelling in your ankles.  You have trouble with your vision. SEEK IMMEDIATE MEDICAL CARE IF:  You develop a severe headache or confusion.  You have unusual weakness, numbness, or feel faint.  You have severe chest or abdominal pain.  You vomit repeatedly.  You have trouble breathing. MAKE SURE YOU:   Understand these instructions.  Will watch your condition.  Will get help right away if you are not doing well or get worse. Document Released: 10/06/2005 Document Revised: 02/20/2014 Document Reviewed: 07/29/2013 [facility] Patient Information 2015 [place], [place]. This information is not intended to replace advice given to you by your health care provider. Make sure you discuss any questions you have with your health care provider.

## 2015-04-27 NOTE — ED Provider Notes (Signed)
Vertigo, dizziness, nausea this morning CT scan head - normal Labs normal Meclizine with improvement Recurrent vomiting  Pending MRI r/o Stroke D/C if neg to harwani w/meclizine  MRI reviewed and found to be negative for acute infarct. Symptoms related to vertigo, with some response with meclizine. Stable for discharge home.   Charlann Lange, PA-C 04/27/15 1856  Dorie Rank, MD 04/30/15 1134

## 2015-04-27 NOTE — ED Notes (Signed)
Patient plans to  take BP medications this evening
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
## 2015-04-27 NOTE — ED Notes (Signed)
Patient was PO challenged, 120 cc of water and 50 cc ginger ale given. Pt reports vomiting

## 2015-04-27 NOTE — ED Notes (Signed)
Patient is able to ambulate without assist. Pt reports feeling "off balance". Gait steady

## 2015-04-27 NOTE — ED Notes (Signed)
Bed: WA09 Expected date:  Expected time:  Means of arrival:  Comments: Ems-N/V

## 2015-04-27 NOTE — ED Notes (Signed)
Patient denied questions r/t dc. He is a&ox4 and ambulatory

## 2015-04-27 NOTE — ED Notes (Signed)
Spoke to MRI technician whom reported pt was unable to complete study. 10 mis of study completed when patient began to suffer with anxiety. Medication recommended, pt refused
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
## 2015-04-27 NOTE — ED Notes (Signed)
Questions concenrs were denied r/t dc. Pt ambulatory and a&ox4

## 2015-04-27 NOTE — ED Notes (Signed)
Patient reports awakening as normal and taking daily medications for hypertension history. After leaving home patient reports coughing and then began to vomit several times. Patient also c/o intermittent dizziness  Along with N/V. Pt is A&Ox4, denies H/A and or blurred vision.

## 2015-04-27 NOTE — ED Notes (Signed)
Patient transported to CT 

## 2015-08-22 ENCOUNTER — Observation Stay (HOSPITAL_COMMUNITY)
Admission: EM | Admit: 2015-08-22 | Discharge: 2015-08-23 | Disposition: A | Discharge status: Home / Self Care | Payer: Non-veteran care | Attending: Internal Medicine | Admitting: Internal Medicine

## 2015-08-22 ENCOUNTER — Emergency Department (HOSPITAL_COMMUNITY): Payer: Non-veteran care

## 2015-08-22 ENCOUNTER — Encounter (HOSPITAL_COMMUNITY): Payer: Self-pay

## 2015-08-22 DIAGNOSIS — E871 Hypo-osmolality and hyponatremia: Secondary | ICD-10-CM | POA: Diagnosis not present

## 2015-08-22 DIAGNOSIS — F1010 Alcohol abuse, uncomplicated: Secondary | ICD-10-CM | POA: Diagnosis not present

## 2015-08-22 DIAGNOSIS — Z79899 Other long term (current) drug therapy: Secondary | ICD-10-CM | POA: Diagnosis not present

## 2015-08-22 DIAGNOSIS — Z7984 Long term (current) use of oral hypoglycemic drugs: Secondary | ICD-10-CM | POA: Insufficient documentation

## 2015-08-22 DIAGNOSIS — K219 Gastro-esophageal reflux disease without esophagitis: Secondary | ICD-10-CM | POA: Diagnosis present

## 2015-08-22 DIAGNOSIS — R7989 Other specified abnormal findings of blood chemistry: Secondary | ICD-10-CM | POA: Diagnosis present

## 2015-08-22 DIAGNOSIS — R945 Abnormal results of liver function studies: Secondary | ICD-10-CM

## 2015-08-22 DIAGNOSIS — E1169 Type 2 diabetes mellitus with other specified complication: Secondary | ICD-10-CM | POA: Insufficient documentation

## 2015-08-22 DIAGNOSIS — I252 Old myocardial infarction: Secondary | ICD-10-CM | POA: Insufficient documentation

## 2015-08-22 DIAGNOSIS — R079 Chest pain, unspecified: Secondary | ICD-10-CM | POA: Diagnosis not present

## 2015-08-22 DIAGNOSIS — I10 Essential (primary) hypertension: Secondary | ICD-10-CM | POA: Diagnosis present

## 2015-08-22 DIAGNOSIS — I959 Hypotension, unspecified: Secondary | ICD-10-CM | POA: Insufficient documentation

## 2015-08-22 DIAGNOSIS — F17210 Nicotine dependence, cigarettes, uncomplicated: Secondary | ICD-10-CM | POA: Diagnosis not present

## 2015-08-22 DIAGNOSIS — Z9114 Patient's other noncompliance with medication regimen: Secondary | ICD-10-CM | POA: Diagnosis not present

## 2015-08-22 DIAGNOSIS — R55 Syncope and collapse: Secondary | ICD-10-CM

## 2015-08-22 DIAGNOSIS — R739 Hyperglycemia, unspecified: Secondary | ICD-10-CM | POA: Diagnosis present

## 2015-08-22 DIAGNOSIS — Z7982 Long term (current) use of aspirin: Secondary | ICD-10-CM | POA: Insufficient documentation

## 2015-08-22 DIAGNOSIS — E7800 Pure hypercholesterolemia, unspecified: Secondary | ICD-10-CM | POA: Insufficient documentation

## 2015-08-22 DIAGNOSIS — E1165 Type 2 diabetes mellitus with hyperglycemia: Secondary | ICD-10-CM | POA: Diagnosis not present

## 2015-08-22 DIAGNOSIS — I2510 Atherosclerotic heart disease of native coronary artery without angina pectoris: Secondary | ICD-10-CM | POA: Diagnosis not present

## 2015-08-22 DIAGNOSIS — Z720 Tobacco use: Secondary | ICD-10-CM | POA: Diagnosis present

## 2015-08-22 DIAGNOSIS — R0789 Other chest pain: Secondary | ICD-10-CM | POA: Diagnosis not present

## 2015-08-22 DIAGNOSIS — R531 Weakness: Secondary | ICD-10-CM | POA: Diagnosis not present

## 2015-08-22 DIAGNOSIS — K210 Gastro-esophageal reflux disease with esophagitis: Secondary | ICD-10-CM | POA: Diagnosis not present

## 2015-08-22 DIAGNOSIS — E118 Type 2 diabetes mellitus with unspecified complications: Secondary | ICD-10-CM | POA: Insufficient documentation

## 2015-08-22 HISTORY — DX: Type 2 diabetes mellitus without complications: E11.9

## 2015-08-22 HISTORY — DX: Atherosclerotic heart disease of native coronary artery without angina pectoris: I25.10

## 2015-08-22 HISTORY — DX: Sleep apnea, unspecified: G47.30

## 2015-08-22 HISTORY — DX: Tobacco use: Z72.0

## 2015-08-22 HISTORY — DX: Alcohol abuse, uncomplicated: F10.10

## 2015-08-22 LAB — BASIC METABOLIC PANEL
Anion gap: 12 (ref 5–15)
Anion gap: 8 (ref 5–15)
BUN: 12 mg/dL (ref 6–20)
BUN: 11 mg/dL (ref 6–20)
CALCIUM: 8.5 mg/dL — AB (ref 8.9–10.3)
CALCIUM: 8.7 mg/dL — AB (ref 8.9–10.3)
CO2: 20 mmol/L — ABNORMAL LOW (ref 22–32)
CO2: 22 mmol/L (ref 22–32)
CREATININE: 0.89 mg/dL (ref 0.61–1.24)
Chloride: 91 mmol/L — ABNORMAL LOW (ref 101–111)
Chloride: 98 mmol/L — ABNORMAL LOW (ref 101–111)
Creatinine, Ser: 1.16 mg/dL (ref 0.61–1.24)
GFR calc Af Amer: >60 mL/min (ref >60)
GFR calc Af Amer: >60 mL/min (ref >60)
GLUCOSE: 237 mg/dL — AB (ref 65–99)
GLUCOSE: 186 mg/dL — AB (ref 65–99)
Potassium: 3.8 mmol/L (ref 3.5–5.1)
Potassium: 4.1 mmol/L (ref 3.5–5.1)
SODIUM: 123 mmol/L — AB (ref 135–145)
Sodium: 128 mmol/L — ABNORMAL LOW (ref 135–145)

## 2015-08-22 LAB — HEPATIC FUNCTION PANEL
ALT: 41 U/L (ref 17–63)
AST: 43 U/L — ABNORMAL HIGH (ref 15–41)
Albumin: 3.4 g/dL — ABNORMAL LOW (ref 3.5–5.0)
Alkaline Phosphatase: 37 U/L — ABNORMAL LOW (ref 38–126)
TOTAL PROTEIN: 6 g/dL — AB (ref 6.5–8.1)
Total Bilirubin: 0.4 mg/dL (ref 0.3–1.2)

## 2015-08-22 LAB — CBC
HEMATOCRIT: 33.7 % — AB (ref 39.0–52.0)
HEMATOCRIT: 33.8 % — AB (ref 39.0–52.0)
Hemoglobin: 11.5 g/dL — ABNORMAL LOW (ref 13.0–17.0)
Hemoglobin: 11.6 g/dL — ABNORMAL LOW (ref 13.0–17.0)
MCH: 31.2 pg (ref 26.0–34.0)
MCH: 31.8 pg (ref 26.0–34.0)
MCHC: 34.1 g/dL (ref 30.0–36.0)
MCHC: 34.3 g/dL (ref 30.0–36.0)
MCV: 91.3 fL (ref 78.0–100.0)
MCV: 92.6 fL (ref 78.0–100.0)
PLATELETS: 191 10*3/uL (ref 150–400)
PLATELETS: 201 10*3/uL (ref 150–400)
RBC: 3.69 MIL/uL — ABNORMAL LOW (ref 4.22–5.81)
RBC: 3.65 MIL/uL — ABNORMAL LOW (ref 4.22–5.81)
RDW: 14.3 % (ref 11.5–15.5)
RDW: 14.4 % (ref 11.5–15.5)
WBC: 5.3 10*3/uL (ref 4.0–10.5)
WBC: 5 10*3/uL (ref 4.0–10.5)

## 2015-08-22 LAB — ETHANOL: Alcohol, Ethyl (B): 194 mg/dL — ABNORMAL HIGH (ref <5)

## 2015-08-22 LAB — OSMOLALITY: OSMOLALITY: 281 mosm/kg (ref 275–300)

## 2015-08-22 LAB — CREATININE, SERUM
Creatinine, Ser: 0.93 mg/dL (ref 0.61–1.24)
GFR calc non Af Amer: >60 mL/min (ref >60)

## 2015-08-22 LAB — I-STAT TROPONIN, ED
TROPONIN I, POC: 0.01 ng/mL (ref 0.00–0.08)
TROPONIN I, POC: 0.01 ng/mL (ref 0.00–0.08)

## 2015-08-22 LAB — LIPASE, BLOOD: LIPASE: 54 U/L — AB (ref 11–51)

## 2015-08-22 LAB — TROPONIN I
Troponin I: <0.03 ng/mL (ref <0.031)
Troponin I: <0.03 ng/mL (ref <0.031)

## 2015-08-22 LAB — GLUCOSE, CAPILLARY
GLUCOSE-CAPILLARY: 135 mg/dL — AB (ref 65–99)
GLUCOSE-CAPILLARY: 203 mg/dL — AB (ref 65–99)
Glucose-Capillary: 115 mg/dL — ABNORMAL HIGH (ref 65–99)
Glucose-Capillary: 166 mg/dL — ABNORMAL HIGH (ref 65–99)

## 2015-08-22 LAB — D-DIMER, QUANTITATIVE (NOT AT ARMC)

## 2015-08-22 LAB — MAGNESIUM: MAGNESIUM: 1.8 mg/dL (ref 1.7–2.4)

## 2015-08-22 MED ORDER — LIVING WELL WITH DIABETES BOOK
Freq: Once | Status: DC
  Filled 2015-08-22: qty 1

## 2015-08-22 MED ORDER — GI COCKTAIL ~~LOC~~: 30.0000 mL | Freq: Once | ORAL | Status: DC

## 2015-08-22 MED ORDER — ONDANSETRON HCL 4 MG/2ML IJ SOLN: 4.0000 mg | Freq: Four times a day (QID) | INTRAMUSCULAR | Status: DC | PRN

## 2015-08-22 MED ORDER — VITAMIN B-1 100 MG PO TABS
100.0000 mg | ORAL_TABLET | Freq: Every day | ORAL | Status: DC
  Administered 2015-08-22 – 2015-08-23 (×2): 100 mg via ORAL
  Filled 2015-08-22 (×2): qty 1

## 2015-08-22 MED ORDER — HYDROCODONE-ACETAMINOPHEN 5-325 MG PO TABS: 1.0000 | ORAL_TABLET | ORAL | Status: DC | PRN

## 2015-08-22 MED ORDER — INSULIN ASPART 100 UNIT/ML ~~LOC~~ SOLN
0.0000 [IU] | Freq: Three times a day (TID) | SUBCUTANEOUS | Status: DC
  Administered 2015-08-22: 3 [IU] via SUBCUTANEOUS
  Administered 2015-08-23: 2 [IU] via SUBCUTANEOUS

## 2015-08-22 MED ORDER — MORPHINE SULFATE (PF) 2 MG/ML IV SOLN: 1.0000 mg | INTRAVENOUS | Status: DC | PRN

## 2015-08-22 MED ORDER — HYDRALAZINE HCL 20 MG/ML IJ SOLN: 5.0000 mg | INTRAMUSCULAR | Status: DC | PRN

## 2015-08-22 MED ORDER — NICOTINE 21 MG/24HR TD PT24
21.0000 mg | MEDICATED_PATCH | Freq: Every day | TRANSDERMAL | Status: DC
  Filled 2015-08-22 (×2): qty 1

## 2015-08-22 MED ORDER — LORAZEPAM 2 MG/ML IJ SOLN: 1.0000 mg | Freq: Four times a day (QID) | INTRAMUSCULAR | Status: DC | PRN

## 2015-08-22 MED ORDER — ALUM & MAG HYDROXIDE-SIMETH 200-200-20 MG/5ML PO SUSP: 30.0000 mL | Freq: Four times a day (QID) | ORAL | Status: DC | PRN

## 2015-08-22 MED ORDER — ACETAMINOPHEN 650 MG RE SUPP: 650.0000 mg | Freq: Four times a day (QID) | RECTAL | Status: DC | PRN

## 2015-08-22 MED ORDER — SODIUM CHLORIDE 0.9 % IV SOLN
INTRAVENOUS | Status: DC
  Administered 2015-08-22 (×2): via INTRAVENOUS

## 2015-08-22 MED ORDER — ENOXAPARIN SODIUM 40 MG/0.4ML ~~LOC~~ SOLN
40.0000 mg | SUBCUTANEOUS | Status: DC
  Filled 2015-08-22: qty 0.4

## 2015-08-22 MED ORDER — ADULT MULTIVITAMIN W/MINERALS CH
1.0000 | ORAL_TABLET | Freq: Every day | ORAL | Status: DC
  Administered 2015-08-22 – 2015-08-23 (×2): 1 via ORAL
  Filled 2015-08-22 (×2): qty 1

## 2015-08-22 MED ORDER — SODIUM CHLORIDE 0.9 % IV BOLUS (SEPSIS)
1000.0000 mL | Freq: Once | INTRAVENOUS | Status: AC
  Administered 2015-08-22: 1000 mL via INTRAVENOUS

## 2015-08-22 MED ORDER — LISINOPRIL 40 MG PO TABS
40.0000 mg | ORAL_TABLET | Freq: Every day | ORAL | Status: DC
  Administered 2015-08-22 – 2015-08-23 (×2): 40 mg via ORAL
  Filled 2015-08-22 (×2): qty 1

## 2015-08-22 MED ORDER — CARVEDILOL 25 MG PO TABS
25.0000 mg | ORAL_TABLET | Freq: Two times a day (BID) | ORAL | Status: DC
  Administered 2015-08-22 – 2015-08-23 (×3): 25 mg via ORAL
  Filled 2015-08-22 (×3): qty 1

## 2015-08-22 MED ORDER — ACETAMINOPHEN 325 MG PO TABS
650.0000 mg | ORAL_TABLET | Freq: Four times a day (QID) | ORAL | Status: DC | PRN
  Administered 2015-08-22: 650 mg via ORAL
  Filled 2015-08-22: qty 2

## 2015-08-22 MED ORDER — LORAZEPAM 1 MG PO TABS: 1.0000 mg | ORAL_TABLET | Freq: Four times a day (QID) | ORAL | Status: DC | PRN

## 2015-08-22 MED ORDER — FOLIC ACID 1 MG PO TABS
1.0000 mg | ORAL_TABLET | Freq: Every day | ORAL | Status: DC
Start: 2015-08-22 — End: 2015-08-23
  Administered 2015-08-22 – 2015-08-23 (×2): 1 mg via ORAL
  Filled 2015-08-22 (×2): qty 1

## 2015-08-22 MED ORDER — INSULIN ASPART 100 UNIT/ML ~~LOC~~ SOLN: 0.0000 [IU] | Freq: Every day | SUBCUTANEOUS | Status: DC

## 2015-08-22 MED ORDER — THIAMINE HCL 100 MG/ML IJ SOLN
100.0000 mg | Freq: Every day | INTRAMUSCULAR | Status: DC
  Filled 2015-08-22: qty 2

## 2015-08-22 MED ORDER — PANTOPRAZOLE SODIUM 40 MG IV SOLR
40.0000 mg | Freq: Two times a day (BID) | INTRAVENOUS | Status: DC
  Administered 2015-08-22 (×2): 40 mg via INTRAVENOUS
  Filled 2015-08-22 (×2): qty 40

## 2015-08-22 MED ORDER — SODIUM CHLORIDE 0.9 % IJ SOLN
3.0000 mL | Freq: Two times a day (BID) | INTRAMUSCULAR | Status: DC
  Administered 2015-08-22 – 2015-08-23 (×3): 3 mL via INTRAVENOUS

## 2015-08-22 MED ORDER — ENOXAPARIN SODIUM 30 MG/0.3ML ~~LOC~~ SOLN
30.0000 mg | SUBCUTANEOUS | Status: DC
  Filled 2015-08-22: qty 0.3

## 2015-08-22 MED ORDER — ONDANSETRON HCL 4 MG PO TABS: 4.0000 mg | ORAL_TABLET | Freq: Four times a day (QID) | ORAL | Status: DC | PRN

## 2015-08-22 MED ORDER — MECLIZINE HCL 25 MG PO TABS: 25.0000 mg | ORAL_TABLET | Freq: Three times a day (TID) | ORAL | Status: DC | PRN

## 2015-08-22 NOTE — ED Notes (Signed)
Explained to the patient urine sample is needed. Patient acknowledges, no urge to void as he recently went to restroom.

## 2015-08-22 NOTE — ED Notes (Signed)
Patient ambulated to restroom without assistance. Gait slow, steady. No distress noted.  

## 2015-08-22 NOTE — ED Provider Notes (Signed)
CSN: [id number]     Arrival date & time 08/22/15  0119 History   By signing my name below, I, Forrestine Him, attest that this documentation has been prepared under the direction and in the presence of Alfonzo Beers, MD.  Electronically Signed: Forrestine Him, ED Scribe. 08/22/2015. 3:06 AM.   Chief Complaint  Patient presents with  . Chest Pain  . Hypotension   The history is provided by the patient. No language interpreter was used.    HPI Comments: Jameel Quant brought in by EMS is a 49 y.o. male with a PMHx of MI, HTN, and hyperlipidemia who presents to the Emergency Department here after an episode of L sided chest pain onset just prior to arrival. Pain described as squeezing. Pt states current pain feels similar to previous MI. Ongoing belching also reported. Pt states symptoms immediately came on after having a large bowel movement. No OTC/prescribed medications attempted prior to arrival. No fever, chills, nausea, vomiting, or shortness of breath. He admits to consuming 2 glasses of alcohol prior to incident. PSHx includes stent placement x 2 after previous MI.  PCP: Charolette Forward, MD    Past Medical History  Diagnosis Date  . MI (myocardial infarction) ([place])     2012  . Hypertension   . High cholesterol   . Acid reflux   . Concussion    Past Surgical History  Procedure Laterality Date  . Stents     Family History  Problem Relation Age of Onset  . Diabetes Mother    Social History  Substance Use Topics  . Smoking status: Current Every Day Smoker -- 0.50 packs/day    Types: Cigarettes  . Smokeless tobacco: None  . Alcohol Use: 3.6 oz/week    6 Cans of beer per week     Comment: occassionally on weekends    Review of Systems  Constitutional: Positive for diaphoresis. Negative for fever and chills.  Respiratory: Negative for cough and shortness of breath.   Cardiovascular: Positive for chest pain.  Gastrointestinal: Negative for nausea, vomiting and abdominal pain.   Musculoskeletal: Negative for back pain.  Skin: Negative for rash.  Neurological: Positive for light-headedness. Negative for headaches.  Psychiatric/Behavioral: Negative for confusion.  All other systems reviewed and are negative.     Allergies  Review of patient's allergies indicates no known allergies.  Home Medications   Prior to Admission medications   Medication Sig Start Date End Date Taking? Authorizing Provider  aspirin 325 MG tablet Take 325 mg by mouth daily.   Yes Historical Provider, MD  carvedilol (COREG) 25 MG tablet Take 25 mg by mouth 2 (two) times daily with a meal.   Yes Historical Provider, MD  chlorthalidone (HYGROTON) 25 MG tablet Take 25 mg by mouth daily.   Yes Historical Provider, MD  glipiZIDE (GLUCOTROL) 5 MG tablet Take 5 mg by mouth 2 (two) times daily. 30 minutes prior to a meal   Yes Historical Provider, MD  lisinopril (PRINIVIL,ZESTRIL) 40 MG tablet Take 40 mg by mouth daily.   Yes Historical Provider, MD  meclizine (ANTIVERT) 12.5 MG tablet Take 2 tablets (25 mg total) by mouth 3 (three) times daily as needed for dizziness. 04/27/15  Yes Robyn M Hess, PA-C  meloxicam (MOBIC) 15 MG tablet Take 15 mg by mouth daily as needed for pain.   Yes Historical Provider, MD  omeprazole (PRILOSEC) 20 MG capsule Take 20 mg by mouth daily.   Yes Historical Provider, MD  simvastatin (ZOCOR) 80 MG  tablet Take 80 mg by mouth at bedtime.   Yes Historical Provider, MD   Triage Vitals: BP 133/83 mmHg  Pulse 75  Temp(Src) 99.1 F (37.3 C) (Oral)  Resp 14  Ht 5\' 11"  (1.803 m)  Wt 168 lb (76.204 kg)  BMI 23.44 kg/m2  SpO2 100%  Vitals reviewed Physical Exam  Physical Examination: General appearance - alert, well appearing, and in no distress Mental status - alert, oriented to person, place, and time Eyes - no conjunctival injection, no scleral icterus Mouth - mucous membranes moist, pharynx normal without lesions Chest - clear to auscultation, no wheezes, rales or  rhonchi, symmetric air entry Heart - normal rate, regular rhythm, normal S1, S2, no murmurs, rubs, clicks or gallops Abdomen - soft, nontender, nondistended, no masses or organomegaly, nabs Neurological - alert, oriented, normal speech, strength and sensation intact in lower extremities bilaterally Extremities - peripheral pulses normal, no pedal edema, no clubbing or cyanosis Skin - normal coloration and turgor, no rashes  ED Course  Procedures (including critical care time)  DIAGNOSTIC STUDIES: Oxygen Saturation is 100% on RA, Normal by my interpretation.    COORDINATION OF CARE: 2:56 AM- Will give fluids. Will order CXR, hepatic function panel, Lipase, BMP, CBC, and i-stat troponin. Discussed treatment plan with pt at bedside and pt agreed to plan.     Labs Review Labs Reviewed  BASIC METABOLIC PANEL - Abnormal; Notable for the following:    Sodium 123 (*)    Chloride 91 (*)    CO2 20 (*)    Glucose, Bld 237 (*)    Calcium 8.5 (*)    All other components within normal limits  CBC - Abnormal; Notable for the following:    RBC 3.69 (*)    Hemoglobin 11.5 (*)    HCT 33.7 (*)    All other components within normal limits  HEPATIC FUNCTION PANEL - Abnormal; Notable for the following:    Total Protein 6.0 (*)    Albumin 3.4 (*)    AST 43 (*)    Alkaline Phosphatase 37 (*)    Bilirubin, Direct <0.1 (*)    All other components within normal limits  LIPASE, BLOOD - Abnormal; Notable for the following:    Lipase 54 (*)    All other components within normal limits  ETHANOL - Abnormal; Notable for the following:    Alcohol, Ethyl (B) 194 (*)    All other components within normal limits  D-DIMER, QUANTITATIVE (NOT AT [facility])  BASIC METABOLIC PANEL  SODIUM, URINE, RANDOM  I-STAT TROPOININ, ED  Randolm Idol, ED    Imaging Review Dg Chest 2 View  08/22/2015  CLINICAL DATA:  Chest pain and shortness of breath tonight. EXAM: CHEST  2 VIEW COMPARISON:  11/20/2014 FINDINGS: Normal  heart size and pulmonary vascularity. No focal airspace disease or consolidation in the lungs. No blunting of costophrenic angles. No pneumothorax. Mediastinal contours appear intact. Old left rib fractures. Mild degenerative changes in the thoracic spine. IMPRESSION: No active cardiopulmonary disease. Electronically Signed   By: Lucienne Capers M.D.   On: 08/22/2015 02:09   I have personally reviewed and evaluated these images and lab results as part of my medical decision-making.   EKG Interpretation   Date/Time:  Wednesday August 22 2015 01:21:30 EDT Ventricular Rate:  87 PR Interval:  179 QRS Duration: 87 QT Interval:  399 QTC Calculation: 480 R Axis:   45 Text Interpretation:  Sinus rhythm Abnormal R-wave progression, early  transition Borderline  ST elevation, anterior leads Borderline prolonged QT  interval No significant change since last tracing Confirmed by [facility]  MD,  [place] [id number]) on 08/22/2015 2:47:52 AM      MDM   Final diagnoses:  Chest pain, unspecified chest pain type  Near syncope  Hypotension, unspecified hypotension type  Hyponatremia    Pt presenting with c/o chest pain, hypotension. He states his pain feels similar to his prior MI.  He is hypotensive on arrival but improved from when EMS arrived at his home after IV fluids.  Labs nad ekg are reassuring including troponin and d-dimer.     5:25 AM d/w Dr. Terrence Dupont, he suggests hospitalist admission for r/o ACS.  hospitalist can consult him if needed.    6:09 AM d/w Dr. Hal Hope, triad for admission.  He requests BMP, urine sodium now and admission to telemetry bed.    Alfonzo Beers, MD 08/22/15 [phone]

## 2015-08-22 NOTE — ED Notes (Signed)
Phlebotomy at bedside.

## 2015-08-22 NOTE — H&P (Signed)
[facility] History and Physical  Aldrin Engelhard EOF:[id number] DOB: [birth year] DOA: 08/22/2015  Referring physician: linker PCP: Charolette Forward, MD   Chief Complaint: chest pain  HPI: [name] is a very pleasant 49 y.o. male with a past medical history that includes MI in 2012 at the age of 47, hypertension diabetes, EtOH abuse, tobacco use presents to the emergency department with chief complaint of chest pain. Initial evaluation reveals no acute EKG changes and a negative troponin, hyponatremia.  Patient reports waking up in the middle of the night with left anterior chest pain. He rates the pain a 7 out of 10 characterized as it as constant describes it as "like someone was wringing out a wet Towel". He denies any radiation of the pain. He denies shortness of breath. Associated symptoms include nausea without vomiting diaphoresis a lot of belching. He also reports having had a very large BM to the onset of the pain. Her consisted of 2 chicken salad which is an 25 Cocktails. So reports having been prescribed Prilosec but has not taken it many months. He reports feeling anxious as this has the same sensation he had for years ago when he had an MI. Said standing up makes it worse and lying down improved. He called EMS was given 81 mg of aspirin. He denies any fever chills dysuria hematuria frequency or urgency. He denies any recent travel. He does report one dark stool over the last 2 days.  Workup in the emergency department reveals negative troponin EKG Sinus rhythm abnormal R-wave progression, early transition borderline ST elevation, anterior leads borderline prolonged QT interval no significant change since last tracing, mildly elevated lipase mildly elevated AST and a sodium level of 123, glucose 237.  In the emergency department is provided with 1 L of normal saline. He is afebrile and hemodynamically stable and not hypoxic  Review of Systems:  10 point review of systems complete  and all systems are negative except as indicated in the history of present illness Past Medical History  Diagnosis Date  . MI (myocardial infarction) ([place])     2012  . Hypertension   . High cholesterol   . Acid reflux   . Concussion    Past Surgical History  Procedure Laterality Date  . Stents     Social History:  reports that he has been smoking Cigarettes.  He has been smoking about 0.50 packs per day. He does not have any smokeless tobacco history on file. He reports that he drinks about 3.6 oz of alcohol per week. He reports that he does not use illicit drugs. [facility] [place] He lives alone he is independent with ADLs No Known Allergies  Family History  Problem Relation Age of Onset  . Diabetes Mother    he reports his mother deceased at age 7 complications from congestive heart failure. Father deceased mid 55s from homicide. He has no siblings.  Prior to Admission medications   Medication Sig Start Date End Date Taking? Authorizing Provider  aspirin 325 MG tablet Take 325 mg by mouth daily.   Yes Historical Provider, MD  carvedilol (COREG) 25 MG tablet Take 25 mg by mouth 2 (two) times daily with a meal.   Yes Historical Provider, MD  chlorthalidone (HYGROTON) 25 MG tablet Take 25 mg by mouth daily.   Yes Historical Provider, MD  glipiZIDE (GLUCOTROL) 5 MG tablet Take 5 mg by mouth 2 (two) times daily. 30 minutes prior to a meal  Yes Historical Provider, MD  lisinopril (PRINIVIL,ZESTRIL) 40 MG tablet Take 40 mg by mouth daily.   Yes Historical Provider, MD  meclizine (ANTIVERT) 12.5 MG tablet Take 2 tablets (25 mg total) by mouth 3 (three) times daily as needed for dizziness. 04/27/15  Yes Robyn M Hess, PA-C  meloxicam (MOBIC) 15 MG tablet Take 15 mg by mouth daily as needed for pain.   Yes Historical Provider, MD  omeprazole (PRILOSEC) 20 MG capsule Take 20 mg by mouth daily.   Yes Historical Provider, MD  simvastatin (ZOCOR) 80 MG tablet Take 80 mg  by mouth at bedtime.   Yes Historical Provider, MD   Physical Exam: Filed Vitals:   08/22/15 0608 08/22/15 0700 08/22/15 0730 08/22/15 0744  BP: 118/73 134/76 133/83 129/83  Pulse: 81 82 75 85  Temp: 99.1 F (37.3 C)   98.7 F (37.1 C)  TempSrc: Oral   Oral  Resp: 15 15 14 17   Height:    5' 11"  (1.803 m)  Weight:    76.431 kg (168 lb 8 oz)  SpO2: 100% 100% 100% 100%    Wt Readings from Last 3 Encounters:  08/22/15 76.431 kg (168 lb 8 oz)  04/27/15 79.833 kg (176 lb)  01/30/15 79.833 kg (176 lb)    General:  Appears calm and comfortable Eyes: PERRL, normal lids, irises & conjunctiva ENT: grossly normal hearing, lips & tongue Neck: no LAD, masses or thyromegaly Cardiovascular: RRR, no m/r/g. No LE edema. Telemetry: SR, no arrhythmias  Respiratory: CTA bilaterally, no w/r/r. Normal respiratory effort. Abdomen: soft, ntnd positive bowel sounds continued belching through exam Skin: no rash or induration seen on limited exam Musculoskeletal: grossly normal tone BUE/BLE Psychiatric: grossly normal mood and affect, speech fluent and appropriate Neurologic: grossly non-focal. speech clear facial symmetry           Labs on Admission:  Basic Metabolic Panel:  Recent Labs Lab 08/22/15 0143 08/22/15 0511  NA 123* 128*  K 3.8 4.1  CL 91* 98*  CO2 20* 22  GLUCOSE 237* 186*  BUN 12 11  CREATININE 1.16 0.89  CALCIUM 8.5* 8.7*   Liver Function Tests:  Recent Labs Lab 08/22/15 0143  AST 43*  ALT 41  ALKPHOS 37*  BILITOT 0.4  PROT 6.0*  ALBUMIN 3.4*    Recent Labs Lab 08/22/15 0143  LIPASE 54*   No results for input(s): AMMONIA in the last 168 hours. CBC:  Recent Labs Lab 08/22/15 0143  WBC 5.3  HGB 11.5*  HCT 33.7*  MCV 91.3  PLT 191   Cardiac Enzymes: No results for input(s): CKTOTAL, CKMB, CKMBINDEX, TROPONINI in the last 168 hours.  BNP (last 3 results) No results for input(s): BNP in the last 8760 hours.  ProBNP (last 3 results) No results  for input(s): PROBNP in the last 8760 hours.  CBG: No results for input(s): GLUCAP in the last 168 hours.  Radiological Exams on Admission: Dg Chest 2 View  08/22/2015  CLINICAL DATA:  Chest pain and shortness of breath tonight. EXAM: CHEST  2 VIEW COMPARISON:  11/20/2014 FINDINGS: Normal heart size and pulmonary vascularity. No focal airspace disease or consolidation in the lungs. No blunting of costophrenic angles. No pneumothorax. Mediastinal contours appear intact. Old left rib fractures. Mild degenerative changes in the thoracic spine. IMPRESSION: No active cardiopulmonary disease. Electronically Signed   By: Lucienne Capers M.D.   On: 08/22/2015 02:09    EKG: Independently reviewed noted above  Assessment/Plan Principal Problem:  Chest pain: At rest. Some typical and atypical features. Will admit to rule out. Heart score 3. Even so I suspect discomfort more related to GI given his presentation noncompliance with PPI use of mobile and EtOH use. Will cycle his troponins get serial EKGs. Provide supportive therapy in the form of analgesia and antimanic as needed. Oxygen support as needed. He was given 81 mg of aspirin per EMS. Will continue this tomorrow. Will hold his statin for now given his slightly elevated liver function tests. Suspect he will rule out an can follow-up with primary cardiologist on outpatient basis and may benefit from an outpatient stress test.  Active Problems: Hyponatremia: Most likely related to diuretics and EtOH use. Was given a liter normal saline in the emergency department and repeat be met revealed a sodium of 128. Trending up gradually. Will obtain serum osmolality urine sodium and urine osmolality for completeness. Will monitor    GERD (gastroesophageal reflux disease): Patient reports ongoing symptoms and not using his PPI listed on home medications. Home medications also include metabolic and he is reported EtOH user. I will hold Mobitz and start him on  Protonix twice a day. Also ordered an initial GI cocktail as patient was belching through entire physical exam.   Hypertension: She reports a history of difficult to control blood pressure. Reports he is on "disability" for high blood pressure. Continue his beta blocker and his ACE inhibitor will hold his [place]. Will order when necessary hydralazine for optimal control. On its her closely    Hyperglycemia: Patient verbalizes no understanding of diabetes diagnosis. Home medications include glipizide and chart review indicates hemoglobin A1c in 2012 was 6.5. He does not check his blood sugar. I will hold his glipizide for now I will use sliding scale insulin for optimal control. Will request diabetes coordinator consult    Elevated LFTs: Patient admits EtOH use and a history of pancreatitis. He's also on a statin. We'll hold this for now. Will recheck in the morning. Likely resume at discharge  EtOH use.: History of an episode of pancreatitis. AST slightly elevated. Lipase slightly elevated. He reports drinking every weekend denies drinking every day. [place] Ana is his drink of choice. Will provide CIWA protocol and monitor for withdrawals. On admission no signs symptoms of withdrawal    CAD (coronary artery disease): See #1. Home medications include a statin. All of with primary cardiologist outpatient if he rules out       Tobacco abuse: Cessation counseling offered   Code Status: full DVT Prophylaxis: Family Communication: none present Disposition Plan: hopefully discharge home 24 hours  Time spent: 10 minutes  [facility]

## 2015-08-22 NOTE — Progress Notes (Signed)
  RD consulted for nutrition education regarding diabetes.   Lab Results  Component Value Date   HGBA1C 6.7* 01/05/2013    RD provided "Carbohydrate Counting for People with Diabetes" handout from the Academy of Nutrition and Dietetics. Discussed different food groups and their effects on blood sugar, emphasizing carbohydrate-containing foods. Provided list of carbohydrates and recommended serving sizes of common foods.  Discussed importance of controlled and consistent carbohydrate intake throughout the day. Provided examples of ways to balance meals/snacks and encouraged intake of high-fiber, whole grain complex carbohydrates. Teach back method used.  Expect good compliance. Patient interested in OP education.  Body mass index is 23.51 kg/(m^2). Pt meets criteria for normal weight based on current BMI.  Current diet order is heart healthy CHO modified, patient is consuming approximately 100% of meals at this time. Labs and medications reviewed. No further nutrition interventions warranted at this time. RD contact information provided. If additional nutrition issues arise, please re-consult RD.  Molli Barrows, RD, LDN, [place] Pager [phone] After Hours Pager [phone]

## 2015-08-22 NOTE — ED Notes (Signed)
Per [facility], pt was at home and a few drinks tonight, got up and went to bathroom and had a massive BM. When he went to stand up became lightheaded and had squeezing cp. Hx of MI in the past with similar symptoms. Went to couch to sit down and was pale and diaphoretic when fire arrived. Sitting his BP was 80/40, lying 90/62, standing 40/20. 20g to RAC and given 750 ml NS. Fire gave 324 mg. CBG 228. Also gave plasma on Saturday.

## 2015-08-22 NOTE — ED Notes (Signed)
Pt returned from xray

## 2015-08-23 DIAGNOSIS — R7989 Other specified abnormal findings of blood chemistry: Secondary | ICD-10-CM

## 2015-08-23 DIAGNOSIS — R0789 Other chest pain: Secondary | ICD-10-CM | POA: Diagnosis not present

## 2015-08-23 DIAGNOSIS — F1010 Alcohol abuse, uncomplicated: Secondary | ICD-10-CM

## 2015-08-23 DIAGNOSIS — I10 Essential (primary) hypertension: Secondary | ICD-10-CM

## 2015-08-23 DIAGNOSIS — E871 Hypo-osmolality and hyponatremia: Secondary | ICD-10-CM

## 2015-08-23 DIAGNOSIS — Z720 Tobacco use: Secondary | ICD-10-CM

## 2015-08-23 DIAGNOSIS — I2510 Atherosclerotic heart disease of native coronary artery without angina pectoris: Secondary | ICD-10-CM

## 2015-08-23 DIAGNOSIS — E118 Type 2 diabetes mellitus with unspecified complications: Secondary | ICD-10-CM

## 2015-08-23 DIAGNOSIS — R079 Chest pain, unspecified: Secondary | ICD-10-CM | POA: Diagnosis not present

## 2015-08-23 DIAGNOSIS — I959 Hypotension, unspecified: Secondary | ICD-10-CM | POA: Diagnosis not present

## 2015-08-23 DIAGNOSIS — E7800 Pure hypercholesterolemia, unspecified: Secondary | ICD-10-CM | POA: Diagnosis not present

## 2015-08-23 DIAGNOSIS — K219 Gastro-esophageal reflux disease without esophagitis: Secondary | ICD-10-CM

## 2015-08-23 LAB — COMPREHENSIVE METABOLIC PANEL
ALK PHOS: 39 U/L (ref 38–126)
ALT: 35 U/L (ref 17–63)
AST: 29 U/L (ref 15–41)
Albumin: 3.3 g/dL — ABNORMAL LOW (ref 3.5–5.0)
Anion gap: 7 (ref 5–15)
BUN: 8 mg/dL (ref 6–20)
CALCIUM: 9 mg/dL (ref 8.9–10.3)
CHLORIDE: 101 mmol/L (ref 101–111)
CO2: 25 mmol/L (ref 22–32)
CREATININE: 0.9 mg/dL (ref 0.61–1.24)
GFR calc Af Amer: >60 mL/min (ref >60)
Glucose, Bld: 123 mg/dL — ABNORMAL HIGH (ref 65–99)
Potassium: 4.2 mmol/L (ref 3.5–5.1)
SODIUM: 133 mmol/L — AB (ref 135–145)
Total Bilirubin: 0.6 mg/dL (ref 0.3–1.2)
Total Protein: 5.9 g/dL — ABNORMAL LOW (ref 6.5–8.1)

## 2015-08-23 LAB — HEMOGLOBIN A1C
Hgb A1c MFr Bld: 6.8 % — ABNORMAL HIGH (ref 4.8–5.6)
MEAN PLASMA GLUCOSE: 148 mg/dL

## 2015-08-23 LAB — CBC
HCT: 33.7 % — ABNORMAL LOW (ref 39.0–52.0)
Hemoglobin: 11.6 g/dL — ABNORMAL LOW (ref 13.0–17.0)
MCH: 31.7 pg (ref 26.0–34.0)
MCHC: 34.4 g/dL (ref 30.0–36.0)
MCV: 92.1 fL (ref 78.0–100.0)
PLATELETS: 207 10*3/uL (ref 150–400)
RBC: 3.66 MIL/uL — ABNORMAL LOW (ref 4.22–5.81)
RDW: 14 % (ref 11.5–15.5)
WBC: 5 10*3/uL (ref 4.0–10.5)

## 2015-08-23 LAB — GLUCOSE, CAPILLARY
GLUCOSE-CAPILLARY: 172 mg/dL — AB (ref 65–99)
Glucose-Capillary: 147 mg/dL — ABNORMAL HIGH (ref 65–99)

## 2015-08-23 LAB — OSMOLALITY, URINE: OSMOLALITY UR: 291 mosm/kg — AB (ref 390–1090)

## 2015-08-23 LAB — VITAMIN B12: Vitamin B-12: 543 pg/mL (ref 180–914)

## 2015-08-23 LAB — SODIUM, URINE, RANDOM: SODIUM UR: 73 mmol/L

## 2015-08-23 MED ORDER — HYDROCODONE-ACETAMINOPHEN 5-325 MG PO TABS: 1.0000 | ORAL_TABLET | ORAL | Status: DC | PRN

## 2015-08-23 MED ORDER — NICOTINE 21 MG/24HR TD PT24: 21.0000 mg | MEDICATED_PATCH | Freq: Every day | TRANSDERMAL | Status: DC

## 2015-08-23 MED ORDER — OMEPRAZOLE 20 MG PO CPDR: 20.0000 mg | DELAYED_RELEASE_CAPSULE | Freq: Every day | ORAL | Status: DC

## 2015-08-23 MED ORDER — PANTOPRAZOLE SODIUM 40 MG PO TBEC
40.0000 mg | DELAYED_RELEASE_TABLET | Freq: Two times a day (BID) | ORAL | Status: DC
  Administered 2015-08-23: 40 mg via ORAL
  Filled 2015-08-23: qty 1

## 2015-08-23 NOTE — Plan of Care (Signed)
Problem: Pain Managment: Goal: General experience of comfort will improve Outcome: Not Progressing Patient complained of BLE pain upon initial movement, resolves on it's own after initial movement.  RN administered two tylenol per MD order, per patient no relief.  RN offered Norco PRN, patient refused at that time.

## 2015-08-23 NOTE — Progress Notes (Signed)
Pt complaint of severe leg pain and numbness when standing.  Dr. Cruzita Lederer notified, no new orders received.  Will continue to monitor.

## 2015-08-23 NOTE — Discharge Instructions (Signed)
Follow with Harwani, Mohan, MD in 5-7 days ° °Please get a complete blood count and chemistry panel checked by your Primary MD at your next visit, and again as instructed by your Primary MD. Please get your medications reviewed and adjusted by your Primary MD. ° °Please request your Primary MD to go over all Hospital Tests and Procedure/Radiological results at the follow up, please get all Hospital records sent to your Prim MD by signing hospital release before you go home. ° °If you had Pneumonia of Lung problems at the Hospital: °Please get a 2 view Chest X ray done in 6-8 weeks after hospital discharge or sooner if instructed by your Primary MD. ° °If you have Congestive Heart Failure: °Please call your Cardiologist or Primary MD anytime you have any of the following symptoms:  °1) 3 pound weight gain in 24 hours or 5 pounds in 1 week  °2) shortness of breath, with or without a dry hacking cough  °3) swelling in the hands, feet or stomach  °4) if you have to sleep on extra pillows at night in order to breathe ° °Follow cardiac low salt diet and 1.5 lit/day fluid restriction. ° °If you have diabetes °Accuchecks 4 times/day, Once in AM empty stomach and then before each meal. °Log in all results and show them to your primary doctor at your next visit. °If any glucose reading is under 80 or above 300 call your primary MD immediately. ° °If you have Seizure/Convulsions/Epilepsy: °Please do not drive, operate heavy machinery, participate in activities at heights or participate in high speed sports until you have seen by Primary MD or a Neurologist and advised to do so again. ° °If you had Gastrointestinal Bleeding: °Please ask your Primary MD to check a complete blood count within one week of discharge or at your next visit. Your endoscopic/colonoscopic biopsies that are pending at the time of discharge, will also need to followed by your Primary MD. ° °Get Medicines reviewed and adjusted. °Please take all your  medications with you for your next visit with your Primary MD ° °Please request your Primary MD to go over all hospital tests and procedure/radiological results at the follow up, please ask your Primary MD to get all Hospital records sent to his/her office. ° °If you experience worsening of your admission symptoms, develop shortness of breath, life threatening emergency, suicidal or homicidal thoughts you must seek medical attention immediately by calling 911 or calling your MD immediately  if symptoms less severe. ° °You must read complete instructions/literature along with all the possible adverse reactions/side effects for all the Medicines you take and that have been prescribed to you. Take any new Medicines after you have completely understood and accpet all the possible adverse reactions/side effects.  ° °Do not drive or operate heavy machinery when taking Pain medications.  ° °Do not take more than prescribed Pain, Sleep and Anxiety Medications ° °Special Instructions: If you have smoked or chewed Tobacco  in the last 2 yrs please stop smoking, stop any regular Alcohol  and or any Recreational drug use. ° °Wear Seat belts while driving. ° °Please note °You were cared for by a hospitalist during your hospital stay. If you have any questions about your discharge medications or the care you received while you were in the hospital after you are discharged, you can call the unit and asked to speak with the hospitalist on call if the hospitalist that took care of you is not available. Once   you are discharged, your primary care physician will handle any further medical issues. Please note that NO REFILLS for any discharge medications will be authorized once you are discharged, as it is imperative that you return to your primary care physician (or establish a relationship with a primary care physician if you do not have one) for your aftercare needs so that they can reassess your need for medications and monitor your  lab values. ° °You can reach the hospitalist office at phone [phone] or fax [phone] °  °If you do not have a primary care physician, you can call [phone] for a physician referral. ° °Activity: As tolerated with Full fall precautions use walker/cane & assistance as needed ° °Diet: heart healthy ° °Disposition Home ° °

## 2015-08-23 NOTE — Progress Notes (Signed)
Patient states tremors are normal for him and does not want any PRN Ativan at this time.

## 2015-08-23 NOTE — Progress Notes (Signed)
Pt refusing lumbar/spine MRI.  Educated pt on importance of MRI especially regarding his symptoms of numb/tingling in legs.  Will continue to monitor.

## 2015-08-23 NOTE — Plan of Care (Signed)
Problem: Health Behavior/Discharge Planning: Goal: Ability to manage health-related needs will improve Outcome: Completed/Met Date Met:  08/23/15 Educated pt no importance of compliance with medications
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
## 2015-08-23 NOTE — Evaluation (Signed)
Physical Therapy Evaluation Patient Details Name: [name] MRN: [id number] DOB: Jan 05, 1966 Today's Date: 08/23/2015   History of Present Illness   [name] is a very pleasant 49 y.o. male with a past medical history that includes MI in 2012, hypertension,  diabetes, EtOH abuse, tobacco use presents to the emergency department with chief complaint of chest pain. Initial evaluation reveals no acute EKG changes and a negative troponin, hyponatremia  Clinical Impression  Pt reported decreased sensation in left thigh and difficulty with standing and walking. Initially pt had shuffling gait for 10' then walked 390' normally without an assistive device and no loss of balance. He reported one instance of L calf cramping when he attempted to balance on LLE, however this too resolved quickly. From PT standpoint he's independent with mobility and ready to DC home. PT signing off.     Follow Up Recommendations No PT follow up    Equipment Recommendations  None recommended by PT    Recommendations for Other Services       Precautions / Restrictions Precautions Precautions: None Precaution Comments: pt denies h/o falls in past year Restrictions Weight Bearing Restrictions: No      Mobility  Bed Mobility Overal bed mobility: Independent                Transfers Overall transfer level: Independent                  Ambulation/Gait Ambulation/Gait assistance: Independent Ambulation Distance (Feet): 400 Feet Assistive device: None Gait Pattern/deviations: WFL(Within Functional Limits)   Gait velocity interpretation: at or above normal speed for age/gender General Gait Details: steady, no loss of balance, pt denied pain, inital 10' of walking pt had very short step length and shuffling gait, then he was able to walk normallyfor last 390', he reported 1.5/10 cramping pain in L calf with attempt to balance on LLE, no calf tenderness, L ankle strength/AROM WNL  Stairs            Wheelchair Mobility    Modified Rankin (Stroke Patients Only)       Balance Overall balance assessment: Independent                                           Pertinent Vitals/Pain Pain Assessment: No/denies pain    Home Living Family/patient expects to be discharged to:: Private residence Living Arrangements: Alone Available Help at Discharge: Friend(s);Available PRN/intermittently Type of Home: Apartment Home Access: Stairs to enter   Entrance Stairs-Number of Steps: flight Home Layout: One level Home Equipment: None      Prior Function Level of Independence: Independent         Comments: works as an Optometrist, works out at gym 3x/week     Journalist, newspaper        Extremity/Trunk Assessment   Upper Extremity Assessment: Overall WFL for tasks assessed           Lower Extremity Assessment: LLE deficits/detail   LLE Deficits / Details: L knee extension -4/5, pt reports decreased sensation to light touch L thigh  Cervical / Trunk Assessment: Normal  Communication   Communication: No difficulties  Cognition Arousal/Alertness: Awake/alert Behavior During Therapy: WFL for tasks assessed/performed Overall Cognitive Status: Within Functional Limits for tasks assessed  General Comments      Exercises        Assessment/Plan    PT Assessment Patent does not need any further PT services  PT Diagnosis     PT Problem List    PT Treatment Interventions     PT Goals (Current goals can be found in the Care Plan section) Acute Rehab PT Goals Patient Stated Goal: return to work as Optometrist and to working out at gym where he likes to play basketball 3x/week PT Goal Formulation: All assessment and education complete, DC therapy    Frequency     Barriers to discharge        Co-evaluation               End of Session Equipment Utilized During Treatment: Gait belt Activity Tolerance:  Patient tolerated treatment well Patient left: in bed;with call bell/phone within reach Nurse Communication: Mobility status    Functional Assessment Tool Used: clinical judgement Functional Limitation: Mobility: Walking and moving around Mobility: Walking and Moving Around Current Status [phone]): 0 percent impaired, limited or restricted Mobility: Walking and Moving Around Goal Status [phone]): 0 percent impaired, limited or restricted Mobility: Walking and Moving Around Discharge Status [phone]): 0 percent impaired, limited or restricted    Time: 1300-1320 PT Time Calculation (min) (ACUTE ONLY): 20 min   Charges:   PT Evaluation $Initial PT Evaluation Tier I: 1 Procedure     PT G Codes:   PT G-Codes **NOT FOR INPATIENT CLASS** Functional Assessment Tool Used: clinical judgement Functional Limitation: Mobility: Walking and moving around Mobility: Walking and Moving Around Current Status (J1552): 0 percent impaired, limited or restricted Mobility: Walking and Moving Around Goal Status (C8022): 0 percent impaired, limited or restricted Mobility: Walking and Moving Around Discharge Status [phone]): 0 percent impaired, limited or restricted    Philomena Doheny 08/23/2015, 1:29 PM [phone]

## 2015-08-23 NOTE — Plan of Care (Signed)
Problem: Pain Managment: Goal: General experience of comfort will improve Outcome: Completed/Met Date Met:  08/23/15 Pt remained pain free and educated on what to do when experiencing chest pain at home     

## 2016-05-30 ENCOUNTER — Encounter (HOSPITAL_BASED_OUTPATIENT_CLINIC_OR_DEPARTMENT_OTHER): Payer: Self-pay | Admitting: *Deleted

## 2016-05-30 ENCOUNTER — Emergency Department (HOSPITAL_BASED_OUTPATIENT_CLINIC_OR_DEPARTMENT_OTHER)
Admission: EM | Admit: 2016-05-30 | Discharge: 2016-05-31 | Disposition: A | Discharge status: Home / Self Care | Payer: Non-veteran care | Attending: Emergency Medicine | Admitting: Emergency Medicine

## 2016-05-30 DIAGNOSIS — R600 Localized edema: Secondary | ICD-10-CM | POA: Insufficient documentation

## 2016-05-30 DIAGNOSIS — I2510 Atherosclerotic heart disease of native coronary artery without angina pectoris: Secondary | ICD-10-CM | POA: Insufficient documentation

## 2016-05-30 DIAGNOSIS — F17210 Nicotine dependence, cigarettes, uncomplicated: Secondary | ICD-10-CM | POA: Diagnosis not present

## 2016-05-30 DIAGNOSIS — Z7982 Long term (current) use of aspirin: Secondary | ICD-10-CM | POA: Insufficient documentation

## 2016-05-30 DIAGNOSIS — E119 Type 2 diabetes mellitus without complications: Secondary | ICD-10-CM | POA: Insufficient documentation

## 2016-05-30 DIAGNOSIS — Z79899 Other long term (current) drug therapy: Secondary | ICD-10-CM | POA: Insufficient documentation

## 2016-05-30 DIAGNOSIS — R42 Dizziness and giddiness: Secondary | ICD-10-CM | POA: Diagnosis present

## 2016-05-30 DIAGNOSIS — Z7984 Long term (current) use of oral hypoglycemic drugs: Secondary | ICD-10-CM | POA: Insufficient documentation

## 2016-05-30 DIAGNOSIS — I10 Essential (primary) hypertension: Secondary | ICD-10-CM | POA: Diagnosis not present

## 2016-05-30 DIAGNOSIS — I159 Secondary hypertension, unspecified: Secondary | ICD-10-CM

## 2016-05-30 NOTE — ED Provider Notes (Signed)
[facility] Provider Note   CSN: [id number] Arrival date & time: 05/30/16  2151  First Provider Contact:  First MD Initiated Contact with Patient 05/30/16 2333    By signing my name below, I, Dyke Brackett, attest that this documentation has been prepared under the direction and in the presence of non-physician practitioner, Jeannett Senior, PA-C  Electronically Signed: Dyke Brackett, Scribe. 05/30/2016. 11:32 PM.    History   Chief Complaint Chief Complaint  Patient presents with  . Dizziness    HPI [name] is a 50 y.o. male with PMHx of CAD and HTN, who presents to the Emergency Department complaining of high blood pressure onset two weeks ago. He states he is compliant with his medication. He was taken off [place] one year ago. Per pt, he has been able to manage his blood pressure until a few weeks ago. He notes associated lightheadedness, leg swelling x3 days and intermittent dull headache. Pt states he started a new job a couple of weeks ago where he stands throughout the day. Reports increased stress and worsening diet since then. Pt denies any chest pain or shortness of breath.   The history is provided by the patient. No language interpreter was used.    Past Medical History:  Diagnosis Date  . Acid reflux   . Concussion   . Coronary artery disease   . Diabetes ([place])   . ETOH abuse   . High cholesterol   . Hypertension   . MI (myocardial infarction) ([place])    2012  . Sleep apnea    USES CPAP AS NEEDED  . Tobacco abuse     Patient Active Problem List   Diagnosis Date Noted  . Chest pain 08/22/2015  . Hyponatremia 08/22/2015  . GERD (gastroesophageal reflux disease) 08/22/2015  . Tobacco abuse 08/22/2015  . ETOH abuse 08/22/2015  . Chest pain at rest 08/22/2015  . Diabetes mellitus with complication ([place])   . Pancreatitis 01/05/2013  . CAD (coronary artery disease) 01/05/2013  . Hypertension 01/05/2013  . Hyperglycemia 01/05/2013  .  Elevated LFTs 01/05/2013    Past Surgical History:  Procedure Laterality Date  . stents      Home Medications    Prior to Admission medications   Medication Sig Start Date End Date Taking? Authorizing Provider  aspirin 325 MG tablet Take 325 mg by mouth daily.   Yes Historical Provider, MD  carvedilol (COREG) 25 MG tablet Take 25 mg by mouth 2 (two) times daily with a meal.   Yes Historical Provider, MD  glipiZIDE (GLUCOTROL) 5 MG tablet Take 5 mg by mouth 2 (two) times daily. 30 minutes prior to a meal   Yes Historical Provider, MD  lisinopril (PRINIVIL,ZESTRIL) 40 MG tablet Take 40 mg by mouth daily.   Yes Historical Provider, MD  omeprazole (PRILOSEC) 20 MG capsule Take 1 capsule (20 mg total) by mouth daily. 08/23/15  Yes Costin Karlyne Greenspan, MD  simvastatin (ZOCOR) 80 MG tablet Take 80 mg by mouth at bedtime.   Yes Historical Provider, MD  chlorthalidone (HYGROTON) 25 MG tablet Take 25 mg by mouth daily.    Historical Provider, MD  HYDROcodone-acetaminophen (NORCO/VICODIN) 5-325 MG tablet Take 1-2 tablets by mouth every 4 (four) hours as needed for moderate pain. 08/23/15   Costin Karlyne Greenspan, MD  meclizine (ANTIVERT) 12.5 MG tablet Take 2 tablets (25 mg total) by mouth 3 (three) times daily as needed for dizziness. 04/27/15   Carman Ching, PA-C  nicotine (NICODERM  CQ - DOSED IN MG/24 HOURS) 21 mg/24hr patch Place 1 patch (21 mg total) onto the skin daily. 08/23/15   Costin Karlyne Greenspan, MD    Family History Family History  Problem Relation Age of Onset  . Diabetes Mother     Social History Social History  Substance Use Topics  . Smoking status: Current Every Day Smoker    Packs/day: 0.50    Years: 30.00    Types: Cigarettes  . Smokeless tobacco: Never Used  . Alcohol use 3.6 oz/week    6 Cans of beer per week     Comment: occassionally on weekends     Allergies   Review of patient's allergies indicates no known allergies.   Review of Systems Review of Systems  Respiratory:  Negative for shortness of breath.   Cardiovascular: Positive for leg swelling. Negative for chest pain.  Neurological: Positive for light-headedness and headaches.  All other systems reviewed and are negative.   Physical Exam Updated Vital Signs BP 182/100   Pulse (!) 53   Temp 98.6 F (37 C) (Oral)   Resp 16   Ht 5\' 11"  (1.803 m)   Wt 186 lb (84.4 kg)   SpO2 100%   BMI 25.94 kg/m   Physical Exam  Constitutional: He is oriented to person, place, and time. He appears well-developed and well-nourished. No distress.  HENT:  Head: Normocephalic and atraumatic.  Eyes: Conjunctivae are normal.  Neck: Normal range of motion. Neck supple.  Cardiovascular: Normal rate, regular rhythm and normal heart sounds.   Pulmonary/Chest: Effort normal and breath sounds normal.  Abdominal: He exhibits no distension.  Musculoskeletal: He exhibits edema.  1+ bilateral swelling to LE  Neurological: He is alert and oriented to person, place, and time.  Skin: Skin is warm and dry.  Psychiatric: He has a normal mood and affect.  Nursing note and vitals reviewed.   ED Treatments / Results  DIAGNOSTIC STUDIES:  Oxygen Saturation is 100% on RA, normal by my interpretation.    COORDINATION OF CARE:  11:38 PM Will order BMP, troponin, and CBC.Discussed treatment plan with pt at bedside and pt agreed to plan.  Labs (all labs ordered are listed, but only abnormal results are displayed) Labs Reviewed  CBC WITH DIFFERENTIAL/PLATELET - Abnormal; Notable for the following:       Result Value   RBC 3.77 (*)    Hemoglobin 12.4 (*)    HCT 35.9 (*)    All other components within normal limits  BRAIN NATRIURETIC PEPTIDE - Abnormal; Notable for the following:    B Natriuretic Peptide 149.5 (*)    All other components within normal limits  TROPONIN I  BASIC METABOLIC PANEL    EKG  EKG Interpretation  Date/Time:  Friday May 30 2016 22:03:49 EDT Ventricular Rate:  59 PR Interval:    QRS  Duration: 94 QT Interval:  424 QTC Calculation: 420 R Axis:   4 Text Interpretation:  Sinus rhythm Abnormal R-wave progression, early transition Borderline T abnormalities, diffuse leads Baseline wander in lead(s) I II aVR No significant change was found Confirmed by Florina Ou  MD, Jenny Reichmann ([id number]) on 05/31/2016 12:33:54 AM       Radiology No results found.  Procedures Procedures (including critical care time)  Medications Ordered in ED Medications - No data to display   Initial Impression / Assessment and Plan / ED Course  I have reviewed the triage vital signs and the nursing notes.  Pertinent labs & imaging results that  were available during my care of the patient were reviewed by me and considered in my medical decision making (see chart for details).  Clinical Course    Patient in emergency department with intermittent dizziness over the last several weeks. States noted that his blood pressure has been high. He reports that he is taken off of hydrochlorothiazide about a year ago. He denies any chest pain shortness of breath. He does report some swelling in his legs that has been recent. He states he has been under more stress and just started a new job. He states it affected his diet as well and he is not eating as healthy. Will check labs, EKG. patient is in no acute distress.   Labs unremarkable. EKGs unchanged. Patient is not orthostatic. Discussed with Dr. Florina Ou, will restart him on his HCTZ. Instructed to watch diet carefully. Follow-up with his doctor. Return precautions discussed. At this time no evidence of end organ damage based on exam and labs. Stable for dc home.   Final Clinical Impressions(s) / ED Diagnoses   Final diagnoses:  Secondary hypertension, unspecified  Dizziness    I personally performed the services described in this documentation, which was scribed in my presence. The recorded information has been reviewed and is accurate.  New Prescriptions New  Prescriptions   HYDROCHLOROTHIAZIDE (HYDRODIURIL) 25 MG TABLET    Take 1 tablet (25 mg total) by mouth daily.     Jeannett Senior, PA-C 05/31/16 0105    Leonard Schwartz, MD 05/31/16 [phone]

## 2016-05-30 NOTE — ED Triage Notes (Signed)
Lightheaded x 2 weeks. Headache x 2 weeks.

## 2016-05-31 LAB — CBC WITH DIFFERENTIAL/PLATELET
BASOS ABS: 0 10*3/uL (ref 0.0–0.1)
BASOS PCT: 0 %
Eosinophils Absolute: 0.1 10*3/uL (ref 0.0–0.7)
Eosinophils Relative: 1 %
HEMATOCRIT: 35.9 % — AB (ref 39.0–52.0)
Hemoglobin: 12.4 g/dL — ABNORMAL LOW (ref 13.0–17.0)
LYMPHS PCT: 41 %
Lymphs Abs: 2.2 10*3/uL (ref 0.7–4.0)
MCH: 32.9 pg (ref 26.0–34.0)
MCHC: 34.5 g/dL (ref 30.0–36.0)
MCV: 95.2 fL (ref 78.0–100.0)
Monocytes Absolute: 0.8 10*3/uL (ref 0.1–1.0)
Monocytes Relative: 15 %
NEUTROS ABS: 2.3 10*3/uL (ref 1.7–7.7)
Neutrophils Relative %: 43 %
PLATELETS: 197 10*3/uL (ref 150–400)
RBC: 3.77 MIL/uL — AB (ref 4.22–5.81)
RDW: 13 % (ref 11.5–15.5)
WBC: 5.3 10*3/uL (ref 4.0–10.5)

## 2016-05-31 LAB — BASIC METABOLIC PANEL
ANION GAP: 6 (ref 5–15)
BUN: 9 mg/dL (ref 6–20)
CALCIUM: 9.2 mg/dL (ref 8.9–10.3)
CO2: 25 mmol/L (ref 22–32)
CREATININE: 0.96 mg/dL (ref 0.61–1.24)
Chloride: 104 mmol/L (ref 101–111)
Glucose, Bld: 88 mg/dL (ref 65–99)
Potassium: 3.7 mmol/L (ref 3.5–5.1)
SODIUM: 135 mmol/L (ref 135–145)

## 2016-05-31 LAB — TROPONIN I

## 2016-05-31 LAB — BRAIN NATRIURETIC PEPTIDE: B NATRIURETIC PEPTIDE 5: 149.5 pg/mL — AB (ref 0.0–100.0)

## 2016-05-31 MED ORDER — HYDROCHLOROTHIAZIDE 25 MG PO TABS
25.0000 mg | ORAL_TABLET | Freq: Once | ORAL | Status: AC
  Administered 2016-05-31: 25 mg via ORAL
  Filled 2016-05-31: qty 1

## 2016-05-31 MED ORDER — HYDROCHLOROTHIAZIDE 25 MG PO TABS: 25.0000 mg | ORAL_TABLET | Freq: Every day | ORAL | 0 refills | Status: DC

## 2016-05-31 NOTE — Discharge Instructions (Signed)
Restart hydrochlorothiazide. limit salt intake. Follow-up with your doctor for recheck.

## 2016-12-31 ENCOUNTER — Emergency Department (HOSPITAL_COMMUNITY): Payer: Non-veteran care | Admitting: Anesthesiology

## 2016-12-31 ENCOUNTER — Encounter (HOSPITAL_COMMUNITY)
Admission: EM | Disposition: A | Discharge status: Home / Self Care | Payer: Self-pay | Source: Home / Self Care | Attending: Emergency Medicine

## 2016-12-31 ENCOUNTER — Observation Stay (HOSPITAL_COMMUNITY)
Admission: EM | Admit: 2016-12-31 | Discharge: 2017-01-02 | Disposition: A | Discharge status: Home / Self Care | Payer: Non-veteran care | Attending: Orthopedic Surgery | Admitting: Orthopedic Surgery

## 2016-12-31 ENCOUNTER — Emergency Department (HOSPITAL_COMMUNITY): Payer: Non-veteran care

## 2016-12-31 ENCOUNTER — Encounter (HOSPITAL_COMMUNITY): Payer: Self-pay | Admitting: Emergency Medicine

## 2016-12-31 DIAGNOSIS — E118 Type 2 diabetes mellitus with unspecified complications: Secondary | ICD-10-CM | POA: Diagnosis not present

## 2016-12-31 DIAGNOSIS — I10 Essential (primary) hypertension: Secondary | ICD-10-CM

## 2016-12-31 DIAGNOSIS — E785 Hyperlipidemia, unspecified: Secondary | ICD-10-CM | POA: Diagnosis present

## 2016-12-31 DIAGNOSIS — Z7984 Long term (current) use of oral hypoglycemic drugs: Secondary | ICD-10-CM | POA: Diagnosis not present

## 2016-12-31 DIAGNOSIS — Y92009 Unspecified place in unspecified non-institutional (private) residence as the place of occurrence of the external cause: Secondary | ICD-10-CM

## 2016-12-31 DIAGNOSIS — N189 Chronic kidney disease, unspecified: Secondary | ICD-10-CM | POA: Diagnosis not present

## 2016-12-31 DIAGNOSIS — Z7982 Long term (current) use of aspirin: Secondary | ICD-10-CM | POA: Diagnosis not present

## 2016-12-31 DIAGNOSIS — S52251A Displaced comminuted fracture of shaft of ulna, right arm, initial encounter for closed fracture: Secondary | ICD-10-CM | POA: Diagnosis not present

## 2016-12-31 DIAGNOSIS — E876 Hypokalemia: Secondary | ICD-10-CM | POA: Diagnosis not present

## 2016-12-31 DIAGNOSIS — I252 Old myocardial infarction: Secondary | ICD-10-CM | POA: Insufficient documentation

## 2016-12-31 DIAGNOSIS — Y92099 Unspecified place in other non-institutional residence as the place of occurrence of the external cause: Secondary | ICD-10-CM

## 2016-12-31 DIAGNOSIS — S52351A Displaced comminuted fracture of shaft of radius, right arm, initial encounter for closed fracture: Secondary | ICD-10-CM | POA: Diagnosis not present

## 2016-12-31 DIAGNOSIS — E7800 Pure hypercholesterolemia, unspecified: Secondary | ICD-10-CM | POA: Insufficient documentation

## 2016-12-31 DIAGNOSIS — G4733 Obstructive sleep apnea (adult) (pediatric): Secondary | ICD-10-CM | POA: Insufficient documentation

## 2016-12-31 DIAGNOSIS — I129 Hypertensive chronic kidney disease with stage 1 through stage 4 chronic kidney disease, or unspecified chronic kidney disease: Secondary | ICD-10-CM | POA: Insufficient documentation

## 2016-12-31 DIAGNOSIS — E1169 Type 2 diabetes mellitus with other specified complication: Secondary | ICD-10-CM | POA: Diagnosis present

## 2016-12-31 DIAGNOSIS — K219 Gastro-esophageal reflux disease without esophagitis: Secondary | ICD-10-CM | POA: Diagnosis not present

## 2016-12-31 DIAGNOSIS — W19XXXA Unspecified fall, initial encounter: Secondary | ICD-10-CM | POA: Diagnosis not present

## 2016-12-31 DIAGNOSIS — Z9119 Patient's noncompliance with other medical treatment and regimen: Secondary | ICD-10-CM | POA: Insufficient documentation

## 2016-12-31 DIAGNOSIS — I2510 Atherosclerotic heart disease of native coronary artery without angina pectoris: Secondary | ICD-10-CM | POA: Insufficient documentation

## 2016-12-31 DIAGNOSIS — F1010 Alcohol abuse, uncomplicated: Secondary | ICD-10-CM | POA: Diagnosis not present

## 2016-12-31 DIAGNOSIS — N179 Acute kidney failure, unspecified: Secondary | ICD-10-CM | POA: Insufficient documentation

## 2016-12-31 DIAGNOSIS — E1122 Type 2 diabetes mellitus with diabetic chronic kidney disease: Secondary | ICD-10-CM | POA: Insufficient documentation

## 2016-12-31 DIAGNOSIS — W109XXA Fall (on) (from) unspecified stairs and steps, initial encounter: Secondary | ICD-10-CM | POA: Insufficient documentation

## 2016-12-31 DIAGNOSIS — F10920 Alcohol use, unspecified with intoxication, uncomplicated: Secondary | ICD-10-CM

## 2016-12-31 DIAGNOSIS — S5291XA Unspecified fracture of right forearm, initial encounter for closed fracture: Secondary | ICD-10-CM | POA: Diagnosis present

## 2016-12-31 DIAGNOSIS — S50811A Abrasion of right forearm, initial encounter: Secondary | ICD-10-CM

## 2016-12-31 HISTORY — PX: ORIF ULNAR FRACTURE: SHX5417

## 2016-12-31 LAB — CBC WITH DIFFERENTIAL/PLATELET
Basophils Absolute: 0 10*3/uL (ref 0.0–0.1)
Basophils Relative: 0 %
Eosinophils Absolute: 0 10*3/uL (ref 0.0–0.7)
Eosinophils Relative: 1 %
HEMATOCRIT: 37.2 % — AB (ref 39.0–52.0)
HEMOGLOBIN: 13.4 g/dL (ref 13.0–17.0)
LYMPHS ABS: 2.3 10*3/uL (ref 0.7–4.0)
Lymphocytes Relative: 30 %
MCH: 33.5 pg (ref 26.0–34.0)
MCHC: 36 g/dL (ref 30.0–36.0)
MCV: 93 fL (ref 78.0–100.0)
MONOS PCT: 8 %
Monocytes Absolute: 0.6 10*3/uL (ref 0.1–1.0)
NEUTROS PCT: 61 %
Neutro Abs: 4.7 10*3/uL (ref 1.7–7.7)
Platelets: 187 10*3/uL (ref 150–400)
RBC: 4 MIL/uL — ABNORMAL LOW (ref 4.22–5.81)
RDW: 13.9 % (ref 11.5–15.5)
WBC: 7.7 10*3/uL (ref 4.0–10.5)

## 2016-12-31 LAB — BASIC METABOLIC PANEL
Anion gap: 14 (ref 5–15)
BUN: 21 mg/dL — ABNORMAL HIGH (ref 6–20)
CHLORIDE: 88 mmol/L — AB (ref 101–111)
CO2: 24 mmol/L (ref 22–32)
CREATININE: 1.29 mg/dL — AB (ref 0.61–1.24)
Calcium: 9.9 mg/dL (ref 8.9–10.3)
GFR calc non Af Amer: >60 mL/min (ref >60)
Glucose, Bld: 267 mg/dL — ABNORMAL HIGH (ref 65–99)
POTASSIUM: 3 mmol/L — AB (ref 3.5–5.1)
Sodium: 126 mmol/L — ABNORMAL LOW (ref 135–145)

## 2016-12-31 LAB — GLUCOSE, CAPILLARY
Glucose-Capillary: 185 mg/dL — ABNORMAL HIGH (ref 65–99)
Glucose-Capillary: 150 mg/dL — ABNORMAL HIGH (ref 65–99)
Glucose-Capillary: 161 mg/dL — ABNORMAL HIGH (ref 65–99)
Glucose-Capillary: 198 mg/dL — ABNORMAL HIGH (ref 65–99)

## 2016-12-31 LAB — ETHANOL: Alcohol, Ethyl (B): 281 mg/dL — ABNORMAL HIGH (ref <5)

## 2016-12-31 SURGERY — OPEN REDUCTION INTERNAL FIXATION (ORIF) ULNAR FRACTURE
Anesthesia: General | Site: Arm Lower | Laterality: Right

## 2016-12-31 MED ORDER — CHLORTHALIDONE 25 MG PO TABS: 25.0000 mg | ORAL_TABLET | Freq: Every day | ORAL | Status: DC

## 2016-12-31 MED ORDER — CEFAZOLIN IN D5W 1 GM/50ML IV SOLN
1.0000 g | INTRAVENOUS | Status: AC
  Administered 2016-12-31: 1 g via INTRAVENOUS

## 2016-12-31 MED ORDER — LORAZEPAM 2 MG/ML IJ SOLN
0.0000 mg | Freq: Four times a day (QID) | INTRAMUSCULAR | Status: AC
Start: 2016-12-31 — End: 2017-01-02

## 2016-12-31 MED ORDER — FAMOTIDINE 20 MG PO TABS
20.0000 mg | ORAL_TABLET | Freq: Two times a day (BID) | ORAL | Status: DC | PRN
  Administered 2016-12-31: 20 mg via ORAL
  Filled 2016-12-31: qty 1

## 2016-12-31 MED ORDER — ONDANSETRON HCL 4 MG PO TABS: 4.0000 mg | ORAL_TABLET | Freq: Four times a day (QID) | ORAL | Status: DC | PRN

## 2016-12-31 MED ORDER — DOCUSATE SODIUM 100 MG PO CAPS
100.0000 mg | ORAL_CAPSULE | Freq: Two times a day (BID) | ORAL | Status: DC
  Administered 2016-12-31 – 2017-01-02 (×5): 100 mg via ORAL
  Filled 2016-12-31 (×6): qty 1

## 2016-12-31 MED ORDER — PROMETHAZINE HCL 12.5 MG RE SUPP
12.5000 mg | Freq: Four times a day (QID) | RECTAL | Status: DC | PRN
  Filled 2016-12-31: qty 1

## 2016-12-31 MED ORDER — ESMOLOL HCL 100 MG/10ML IV SOLN
INTRAVENOUS | Status: DC | PRN
  Administered 2016-12-31 (×3): 20 mg via INTRAVENOUS

## 2016-12-31 MED ORDER — VITAMIN B-1 100 MG PO TABS
100.0000 mg | ORAL_TABLET | Freq: Every day | ORAL | Status: DC
  Administered 2016-12-31 – 2017-01-02 (×3): 100 mg via ORAL
  Filled 2016-12-31 (×3): qty 1

## 2016-12-31 MED ORDER — METHOCARBAMOL 500 MG PO TABS
500.0000 mg | ORAL_TABLET | Freq: Four times a day (QID) | ORAL | Status: DC | PRN
  Administered 2016-12-31 – 2017-01-02 (×4): 500 mg via ORAL
  Filled 2016-12-31 (×4): qty 1

## 2016-12-31 MED ORDER — HYDROMORPHONE HCL 1 MG/ML IJ SOLN: 0.2500 mg | INTRAMUSCULAR | Status: DC | PRN

## 2016-12-31 MED ORDER — POTASSIUM CHLORIDE IN NACL 20-0.45 MEQ/L-% IV SOLN
INTRAVENOUS | Status: DC
  Filled 2016-12-31: qty 1000

## 2016-12-31 MED ORDER — HYDRALAZINE HCL 20 MG/ML IJ SOLN: 10.0000 mg | Freq: Four times a day (QID) | INTRAMUSCULAR | Status: DC | PRN

## 2016-12-31 MED ORDER — ASPIRIN 325 MG PO TABS
325.0000 mg | ORAL_TABLET | Freq: Every day | ORAL | Status: DC
  Administered 2017-01-01 – 2017-01-02 (×2): 325 mg via ORAL
  Filled 2016-12-31 (×2): qty 1

## 2016-12-31 MED ORDER — LISINOPRIL 40 MG PO TABS
40.0000 mg | ORAL_TABLET | Freq: Every day | ORAL | Status: DC
  Administered 2016-12-31: 40 mg via ORAL
  Filled 2016-12-31: qty 1

## 2016-12-31 MED ORDER — METHOCARBAMOL 1000 MG/10ML IJ SOLN
500.0000 mg | Freq: Four times a day (QID) | INTRAVENOUS | Status: DC | PRN
  Filled 2016-12-31: qty 5

## 2016-12-31 MED ORDER — CEFAZOLIN SODIUM-DEXTROSE 2-3 GM-% IV SOLR
INTRAVENOUS | Status: DC | PRN
  Administered 2016-12-31: 2 g via INTRAVENOUS

## 2016-12-31 MED ORDER — HYDROCHLOROTHIAZIDE 25 MG PO TABS: 25.0000 mg | ORAL_TABLET | Freq: Every day | ORAL | Status: DC

## 2016-12-31 MED ORDER — VITAMIN C 500 MG PO TABS
1000.0000 mg | ORAL_TABLET | Freq: Every day | ORAL | Status: DC
  Administered 2016-12-31 – 2017-01-02 (×3): 1000 mg via ORAL
  Filled 2016-12-31 (×3): qty 2

## 2016-12-31 MED ORDER — LORAZEPAM 1 MG PO TABS: 1.0000 mg | ORAL_TABLET | Freq: Four times a day (QID) | ORAL | Status: DC | PRN

## 2016-12-31 MED ORDER — SUCCINYLCHOLINE CHLORIDE 20 MG/ML IJ SOLN
INTRAMUSCULAR | Status: DC | PRN
  Administered 2016-12-31: 140 mg via INTRAVENOUS

## 2016-12-31 MED ORDER — NICOTINE 14 MG/24HR TD PT24
14.0000 mg | MEDICATED_PATCH | Freq: Every day | TRANSDERMAL | Status: DC
Start: 2016-12-31 — End: 2017-01-02
  Filled 2016-12-31 (×2): qty 1

## 2016-12-31 MED ORDER — LIDOCAINE HCL (CARDIAC) 20 MG/ML IV SOLN
INTRAVENOUS | Status: DC | PRN
  Administered 2016-12-31: 80 mg via INTRATRACHEAL

## 2016-12-31 MED ORDER — PROMETHAZINE HCL 25 MG/ML IJ SOLN
6.2500 mg | INTRAMUSCULAR | Status: DC | PRN
  Administered 2016-12-31: 6.25 mg via INTRAVENOUS

## 2016-12-31 MED ORDER — LORAZEPAM 2 MG/ML IJ SOLN: 1.0000 mg | Freq: Four times a day (QID) | INTRAMUSCULAR | Status: DC | PRN

## 2016-12-31 MED ORDER — POTASSIUM CHLORIDE 10 MEQ/100ML IV SOLN
10.0000 meq | INTRAVENOUS | Status: AC
  Administered 2016-12-31 (×2): 10 meq via INTRAVENOUS
  Filled 2016-12-31 (×3): qty 100

## 2016-12-31 MED ORDER — CARVEDILOL 25 MG PO TABS
25.0000 mg | ORAL_TABLET | Freq: Two times a day (BID) | ORAL | Status: DC
  Administered 2016-12-31 – 2017-01-02 (×5): 25 mg via ORAL
  Filled 2016-12-31 (×5): qty 1

## 2016-12-31 MED ORDER — ATORVASTATIN CALCIUM 40 MG PO TABS
40.0000 mg | ORAL_TABLET | Freq: Every day | ORAL | Status: DC
  Administered 2016-12-31 – 2017-01-01 (×2): 40 mg via ORAL
  Filled 2016-12-31 (×2): qty 1

## 2016-12-31 MED ORDER — ALPRAZOLAM 0.5 MG PO TABS: 0.5000 mg | ORAL_TABLET | Freq: Four times a day (QID) | ORAL | Status: DC | PRN

## 2016-12-31 MED ORDER — SODIUM CHLORIDE 0.9 % IV SOLN
Freq: Once | INTRAVENOUS | Status: AC
  Administered 2016-12-31: 02:00:00 via INTRAVENOUS

## 2016-12-31 MED ORDER — PROPOFOL 10 MG/ML IV BOLUS
INTRAVENOUS | Status: DC | PRN
  Administered 2016-12-31: 120 mg via INTRAVENOUS

## 2016-12-31 MED ORDER — MORPHINE SULFATE (PF) 2 MG/ML IV SOLN
1.0000 mg | INTRAVENOUS | Status: DC | PRN
  Administered 2016-12-31: 1 mg via INTRAVENOUS
  Filled 2016-12-31: qty 1

## 2016-12-31 MED ORDER — THIAMINE HCL 100 MG/ML IJ SOLN: 100.0000 mg | Freq: Every day | INTRAMUSCULAR | Status: DC

## 2016-12-31 MED ORDER — SUFENTANIL CITRATE 50 MCG/ML IV SOLN
INTRAVENOUS | Status: DC | PRN
  Administered 2016-12-31 (×2): 10 ug via INTRAVENOUS

## 2016-12-31 MED ORDER — ONDANSETRON HCL 4 MG/2ML IJ SOLN: 4.0000 mg | Freq: Four times a day (QID) | INTRAMUSCULAR | Status: DC | PRN

## 2016-12-31 MED ORDER — SODIUM CHLORIDE 0.9 % IV SOLN: 30.0000 meq | Freq: Once | INTRAVENOUS | Status: DC

## 2016-12-31 MED ORDER — FENTANYL CITRATE (PF) 100 MCG/2ML IJ SOLN
25.0000 ug | Freq: Once | INTRAMUSCULAR | Status: AC
  Administered 2016-12-31: 25 ug via INTRAVENOUS
  Filled 2016-12-31: qty 2

## 2016-12-31 MED ORDER — CEFAZOLIN IN D5W 1 GM/50ML IV SOLN
1.0000 g | Freq: Three times a day (TID) | INTRAVENOUS | Status: AC
  Administered 2016-12-31 – 2017-01-01 (×3): 1 g via INTRAVENOUS
  Filled 2016-12-31 (×3): qty 50

## 2016-12-31 MED ORDER — 0.9 % SODIUM CHLORIDE (POUR BTL) OPTIME
TOPICAL | Status: DC | PRN
  Administered 2016-12-31 (×2): 1000 mL

## 2016-12-31 MED ORDER — PROMETHAZINE HCL 25 MG/ML IJ SOLN
INTRAMUSCULAR | Status: AC
  Filled 2016-12-31: qty 1

## 2016-12-31 MED ORDER — INSULIN ASPART 100 UNIT/ML ~~LOC~~ SOLN
0.0000 [IU] | Freq: Three times a day (TID) | SUBCUTANEOUS | Status: DC
  Administered 2016-12-31: 2 [IU] via SUBCUTANEOUS
  Administered 2016-12-31: 1 [IU] via SUBCUTANEOUS
  Administered 2017-01-01 – 2017-01-02 (×4): 3 [IU] via SUBCUTANEOUS

## 2016-12-31 MED ORDER — LORAZEPAM 2 MG/ML IJ SOLN
0.0000 mg | Freq: Two times a day (BID) | INTRAMUSCULAR | Status: DC
Start: 2017-01-02 — End: 2017-01-02

## 2016-12-31 MED ORDER — SODIUM CHLORIDE 0.9 % IV SOLN
INTRAVENOUS | Status: DC | PRN
  Administered 2016-12-31 (×3): via INTRAVENOUS

## 2016-12-31 MED ORDER — SODIUM CHLORIDE 0.9 % IV BOLUS (SEPSIS)
1000.0000 mL | Freq: Once | INTRAVENOUS | Status: AC
  Administered 2016-12-31: 1000 mL via INTRAVENOUS

## 2016-12-31 MED ORDER — CEFAZOLIN IN D5W 1 GM/50ML IV SOLN
INTRAVENOUS | Status: AC
  Filled 2016-12-31: qty 50

## 2016-12-31 MED ORDER — FOLIC ACID 1 MG PO TABS
1.0000 mg | ORAL_TABLET | Freq: Every day | ORAL | Status: DC
  Administered 2016-12-31 – 2017-01-02 (×3): 1 mg via ORAL
  Filled 2016-12-31 (×3): qty 1

## 2016-12-31 MED ORDER — POTASSIUM CHLORIDE CRYS ER 20 MEQ PO TBCR
30.0000 meq | EXTENDED_RELEASE_TABLET | Freq: Two times a day (BID) | ORAL | Status: AC
  Administered 2016-12-31 – 2017-01-01 (×3): 30 meq via ORAL
  Filled 2016-12-31 (×3): qty 1

## 2016-12-31 MED ORDER — ADULT MULTIVITAMIN W/MINERALS CH
1.0000 | ORAL_TABLET | Freq: Every day | ORAL | Status: DC
  Administered 2016-12-31 – 2017-01-02 (×3): 1 via ORAL
  Filled 2016-12-31 (×3): qty 1

## 2016-12-31 MED ORDER — GLIPIZIDE 5 MG PO TABS
5.0000 mg | ORAL_TABLET | Freq: Two times a day (BID) | ORAL | Status: DC
  Administered 2016-12-31 – 2017-01-02 (×4): 5 mg via ORAL
  Filled 2016-12-31 (×4): qty 1

## 2016-12-31 MED ORDER — ONDANSETRON HCL 4 MG/2ML IJ SOLN
INTRAMUSCULAR | Status: DC | PRN
  Administered 2016-12-31: 4 mg via INTRAVENOUS

## 2016-12-31 MED ORDER — OXYCODONE HCL 5 MG PO TABS
5.0000 mg | ORAL_TABLET | ORAL | Status: DC | PRN
  Administered 2016-12-31 – 2017-01-02 (×8): 10 mg via ORAL
  Filled 2016-12-31 (×8): qty 2

## 2016-12-31 SURGICAL SUPPLY — 65 items
BANDAGE ACE 4X5 VEL STRL LF (GAUZE/BANDAGES/DRESSINGS) ×3 IMPLANT
BANDAGE ELASTIC 3 VELCRO ST LF (GAUZE/BANDAGES/DRESSINGS) ×3 IMPLANT
BANDAGE ELASTIC 4 VELCRO ST LF (GAUZE/BANDAGES/DRESSINGS) ×3 IMPLANT
BIT DRILL 2.5X205 (BIT) ×2 IMPLANT
BLADE CLIPPER SURG (BLADE) IMPLANT
BNDG ESMARK 4X9 LF (GAUZE/BANDAGES/DRESSINGS) ×3 IMPLANT
BNDG GAUZE ELAST 4 BULKY (GAUZE/BANDAGES/DRESSINGS) ×3 IMPLANT
CORDS BIPOLAR (ELECTRODE) ×3 IMPLANT
COVER SURGICAL LIGHT HANDLE (MISCELLANEOUS) ×3 IMPLANT
CUFF TOURNIQUET SINGLE 18IN (TOURNIQUET CUFF) ×3 IMPLANT
CUFF TOURNIQUET SINGLE 24IN (TOURNIQUET CUFF) IMPLANT
DECANTER SPIKE VIAL GLASS SM (MISCELLANEOUS) IMPLANT
DRAIN TLS ROUND 10FR (DRAIN) IMPLANT
DRAPE INCISE IOBAN 66X45 STRL (DRAPES) ×3 IMPLANT
DRAPE OEC MINIVIEW 54X84 (DRAPES) ×3 IMPLANT
DRAPE U-SHAPE 47X51 STRL (DRAPES) ×3 IMPLANT
DRILL BIT 2.5X205 (BIT) ×4
DRSG ADAPTIC 3X8 NADH LF (GAUZE/BANDAGES/DRESSINGS) ×3 IMPLANT
ELECT REM PT RETURN 9FT ADLT (ELECTROSURGICAL)
ELECTRODE REM PT RTRN 9FT ADLT (ELECTROSURGICAL) IMPLANT
GAUZE SPONGE 4X4 12PLY STRL (GAUZE/BANDAGES/DRESSINGS) ×3 IMPLANT
GAUZE XEROFORM 1X8 LF (GAUZE/BANDAGES/DRESSINGS) ×3 IMPLANT
GAUZE XEROFORM 5X9 LF (GAUZE/BANDAGES/DRESSINGS) ×3 IMPLANT
GLOVE BIOGEL M 8.0 STRL (GLOVE) ×3 IMPLANT
GLOVE SS BIOGEL STRL SZ 8 (GLOVE) ×1 IMPLANT
GLOVE SUPERSENSE BIOGEL SZ 8 (GLOVE) ×2
GOWN STRL REUS W/ TWL LRG LVL3 (GOWN DISPOSABLE) ×3 IMPLANT
GOWN STRL REUS W/ TWL XL LVL3 (GOWN DISPOSABLE) ×3 IMPLANT
GOWN STRL REUS W/TWL LRG LVL3 (GOWN DISPOSABLE) ×6
GOWN STRL REUS W/TWL XL LVL3 (GOWN DISPOSABLE) ×6
KIT BASIN OR (CUSTOM PROCEDURE TRAY) ×3 IMPLANT
KIT ROOM TURNOVER OR (KITS) ×3 IMPLANT
LOOP VESSEL MAXI BLUE (MISCELLANEOUS) ×3 IMPLANT
MANIFOLD NEPTUNE II (INSTRUMENTS) ×3 IMPLANT
NEEDLE 22X1 1/2 (OR ONLY) (NEEDLE) IMPLANT
NEEDLE BLUNT 16X1.5 OR ONLY (NEEDLE) IMPLANT
NS IRRIG 1000ML POUR BTL (IV SOLUTION) ×3 IMPLANT
PACK ORTHO EXTREMITY (CUSTOM PROCEDURE TRAY) ×3 IMPLANT
PAD ARMBOARD 7.5X6 YLW CONV (MISCELLANEOUS) ×6 IMPLANT
PAD CAST 4YDX4 CTTN HI CHSV (CAST SUPPLIES) ×1 IMPLANT
PADDING CAST COTTON 4X4 STRL (CAST SUPPLIES) ×2
PLATE 7 HOLE (Plate) ×3 IMPLANT
PLATE 8 HOLE LOCKING 3.5MM (Plate) ×3 IMPLANT
SCREW CORTICAL 3.5 16MM (Screw) ×27 IMPLANT
SCREW CORTICAL 3.5X14 (Screw) ×12 IMPLANT
SCRUB BETADINE 4OZ XXX (MISCELLANEOUS) ×3 IMPLANT
SOLUTION BETADINE 4OZ (MISCELLANEOUS) ×3 IMPLANT
SPLINT FIBERGLASS 4X30 (CAST SUPPLIES) ×3 IMPLANT
SPONGE LAP 4X18 X RAY DECT (DISPOSABLE) ×3 IMPLANT
STAPLER VISISTAT 35W (STAPLE) IMPLANT
SUCTION FRAZIER HANDLE 10FR (MISCELLANEOUS) ×2
SUCTION TUBE FRAZIER 10FR DISP (MISCELLANEOUS) ×1 IMPLANT
SUT ETHILON 4 0 PS 2 18 (SUTURE) IMPLANT
SUT PROLENE 3 0 PS 2 (SUTURE) ×9 IMPLANT
SUT PROLENE 4 0 PS 2 18 (SUTURE) IMPLANT
SUT VIC AB 3-0 FS2 27 (SUTURE) ×3 IMPLANT
SYR CONTROL 10ML LL (SYRINGE) ×3 IMPLANT
SYSTEM CHEST DRAIN TLS 7FR (DRAIN) IMPLANT
TOWEL OR 17X24 6PK STRL BLUE (TOWEL DISPOSABLE) ×3 IMPLANT
TOWEL OR 17X26 10 PK STRL BLUE (TOWEL DISPOSABLE) ×3 IMPLANT
TUBE CONNECTING 12'X1/4 (SUCTIONS) ×1
TUBE CONNECTING 12X1/4 (SUCTIONS) ×2 IMPLANT
TUBE EVACUATION TLS (MISCELLANEOUS) ×3 IMPLANT
WATER STERILE IRR 1000ML POUR (IV SOLUTION) ×3 IMPLANT
YANKAUER SUCT BULB TIP NO VENT (SUCTIONS) ×3 IMPLANT

## 2016-12-31 NOTE — ED Notes (Signed)
Bed: GD92 Expected date:  Expected time:  Means of arrival:  Comments: Arm deformity

## 2016-12-31 NOTE — Op Note (Signed)
NAME:  ARYAN, SPARKS NO.:  MEDICAL RECORD NO.:  [id number]  LOCATION:                                 FACILITY:  PHYSICIAN:  Satira Anis. Jamont Mellin, M.D.     DATE OF BIRTH:  DATE OF PROCEDURE: DATE OF DISCHARGE:                              OPERATIVE REPORT   PREOPERATIVE DIAGNOSIS:  Comminuted complex both-bone forearm fracture, right upper extremity, acutely displaced.  POSTOPERATIVE DIAGNOSIS:  Comminuted complex both-bone forearm fracture, right upper extremity, acutely displaced.  PROCEDURE: 1. Right radius and ulna open reduction internal fixation with 8-hole     Zimmer stainless steel plate on the radius and 7-hole stainless     steel Zimmer plate on the ulna. 2. Four-view radiographic series, right forearm.  SURGEON:  Satira Anis. Amedeo Plenty, MD.  ASSISTANT:  None.  COMPLICATIONS:  None.  ANESTHESIA:  General.  TOURNIQUET TIME:  Less than 90 minutes.  INDICATIONS:  This patient is 51 year old male with acutely displaced fracture, he cannot move his hand or wrist, he is in significant pain. He has been counseled in regard to risks and benefits of surgery and desires to proceed with the above-mentioned operative intervention.  All questions have been encouraged and answered preoperatively.  OPERATIVE PROCEDURE:  The patient was seen by myself and Anesthesia, taken to the operative theater, underwent smooth induction of general anesthesia.  His arm was prepped with 2 separate pre scrubs of Hibiclens by myself, followed by a 10-minute surgical Betadine scrub and paint by myself.  Following this, sterile field was secured.  Preoperative antibiotics were given.  He was sterilely secured and arm was elevated and tourniquet was insufflated.  Following this, dissection was carried down.  A volar Mallie Mussel approach was made.  FCR and radial artery were dissected ulnarly.  The brachioradialis and superficial radial nerve were dissected radially.  I exposed  the fracture.  I did release the pronator insertion to approximately 75% and then irrigated the bony ends, curettaged the bony ends, and coapted the fracture.  I pre bent an 8-hole plate from Zimmer stainless steel in nature and then applied this in compression mode.  Interdigitation looked excellent.  The screw lengths were checked, all looked well.  The wound was irrigated and ultimately closed with conclusion of the case with Prolene suture of the 3-0 variety.  Following this, the fingertrap traction was placed and without any distraction about the arm, I placed him in a traction holder to assist for the assistant purposes.  I exposed subcutaneous border of the ulnar interval between the FCU and ECU was made and following this, I coapted the bony ends after repairing the bony ends with irrigation, curettage, etc.  A 7-hole plate applied and compression mode, allowed for interdigitation of the fracture site and coaxial compression.  The patient tolerated this well.  There were no complicating features. Final x-rays were taken.  The wound was closed with 3-0 Vicryl in the interval of the ECU and FCU, followed by 3-0 Prolene in the skin edge. Soft compartments were realized.  Good pulse, good refill.  No complicating features.  The patient tolerated the procedure well.  There  were no complicating issues.  Following this, I placed him in a long-arm splint.  The patient tolerated this well.  He will be admitted for IV antibiotics, general postop observation, and other measures.  We will see him back in the office once discharged in 14 days.     Satira Anis. Amedeo Plenty, M.D.     [facility]  D:  12/31/2016  T:  12/31/2016  Job:  [id number]

## 2016-12-31 NOTE — Progress Notes (Addendum)
Inpatient Diabetes Program Recommendations  AACE/ADA: New Consensus Statement on Inpatient Glycemic Control (2015)  Target Ranges:  Prepandial:   less than 140 mg/dL      Peak postprandial:   less than 180 mg/dL (1-2 hours)      Critically ill patients:  140 - 180 mg/dL   Lab Results  Component Value Date   GLUCAP 185 (H) 12/31/2016   HGBA1C 6.8 (H) 08/22/2015   Review of Glycemic Control  Diabetes history: DM 2 Outpatient Diabetes medications: Glipizide 5 mg BID Current orders for Inpatient glycemic control: Glipizide 5 mg BID  Inpatient Diabetes Program Recommendations:   Consider discontinuing Glipizide due to alterations in PO intake and activity.   Consider ordering CBGs ACHS, Novolog Sensitive Correction TID + HS scale, Carb modified diet.  Thanks,  Tama Headings RN, MSN, [facility] Inpatient Diabetes Coordinator Team Pager [phone] (8a-5p)

## 2016-12-31 NOTE — Anesthesia Postprocedure Evaluation (Signed)
Anesthesia Post Note  Patient: [name]  Procedure(s) Performed: Procedure(s) (LRB): OPEN REDUCTION INTERNAL FIXATION (ORIF) both bone forearm fracture (Right)  Patient location during evaluation: PACU Anesthesia Type: General Level of consciousness: awake and alert Pain management: pain level controlled Vital Signs Assessment: post-procedure vital signs reviewed and stable Respiratory status: spontaneous breathing, nonlabored ventilation, respiratory function stable and patient connected to nasal cannula oxygen Cardiovascular status: blood pressure returned to baseline and stable Postop Assessment: no signs of nausea or vomiting Anesthetic complications: no       Last Vitals:  Vitals:   12/31/16 0807 12/31/16 0810  BP:    Pulse: 98 (!) 103  Resp: 15 15  Temp:      Last Pain:  Vitals:   12/31/16 0655  TempSrc:   PainSc: 0-No pain                 Tiajuana Amass

## 2016-12-31 NOTE — ED Notes (Signed)
Report given to ED and the OR, both are aware of patients arrival.

## 2016-12-31 NOTE — ED Provider Notes (Addendum)
[facility] DEPT Provider Note: Georgena Spurling, MD, FACEP  CSN: [id number] MRN: [id number] ARRIVAL: 12/31/16 at [place]: [id number]/[id number]  By signing my name below, I, Collene Leyden, attest that this documentation has been prepared under the direction and in the presence of Shanon Rosser, MD. Electronically Signed: Collene Leyden, Scribe. 12/31/16. 1:38 AM.  CHIEF COMPLAINT  Arm Injury   HISTORY OF PRESENT ILLNESS  [name] is a 51 y.o. male with no pertinent medical history, who presents to the emergency department with complaints of a right arm injury that happened prior to arrival. Patient states he slipped on carpet and fell down the stairs, catching himself with his right arm. Patient reports associated right arm pain, deformity, and swelling. Patient received 100 mcg of fentanyl prior to arrival. Patient denies any neck pain, back pain, hip pain, left arm pain, or leg pain.    Past Medical History:  Diagnosis Date  . Acid reflux   . Concussion   . Coronary artery disease   . Diabetes ([place])   . ETOH abuse   . High cholesterol   . Hypertension   . MI (myocardial infarction)    2012  . Sleep apnea    USES CPAP AS NEEDED  . Tobacco abuse     Past Surgical History:  Procedure Laterality Date  . stents      Family History  Problem Relation Age of Onset  . Diabetes Mother     Social History  Substance Use Topics  . Smoking status: Current Every Day Smoker    Packs/day: 0.50    Years: 30.00    Types: Cigarettes  . Smokeless tobacco: Never Used  . Alcohol use 3.6 oz/week    6 Cans of beer per week     Comment: occassionally on weekends    Prior to Admission medications   Medication Sig Start Date End Date Taking? Authorizing Provider  aspirin 325 MG tablet Take 325 mg by mouth daily.    Historical Provider, MD  carvedilol (COREG) 25 MG tablet Take 25 mg by mouth 2 (two) times daily with a meal.    Historical Provider, MD  chlorthalidone (HYGROTON) 25 MG tablet  Take 25 mg by mouth daily.    Historical Provider, MD  glipiZIDE (GLUCOTROL) 5 MG tablet Take 5 mg by mouth 2 (two) times daily. 30 minutes prior to a meal    Historical Provider, MD  hydrochlorothiazide (HYDRODIURIL) 25 MG tablet Take 1 tablet (25 mg total) by mouth daily. 05/31/16   Tatyana Kirichenko, PA-C  HYDROcodone-acetaminophen (NORCO/VICODIN) 5-325 MG tablet Take 1-2 tablets by mouth every 4 (four) hours as needed for moderate pain. 08/23/15   Costin Karlyne Greenspan, MD  lisinopril (PRINIVIL,ZESTRIL) 40 MG tablet Take 40 mg by mouth daily.    Historical Provider, MD  meclizine (ANTIVERT) 12.5 MG tablet Take 2 tablets (25 mg total) by mouth 3 (three) times daily as needed for dizziness. 04/27/15   Robyn M Hess, PA-C  nicotine (NICODERM CQ - DOSED IN MG/24 HOURS) 21 mg/24hr patch Place 1 patch (21 mg total) onto the skin daily. 08/23/15   Costin Karlyne Greenspan, MD  omeprazole (PRILOSEC) 20 MG capsule Take 1 capsule (20 mg total) by mouth daily. 08/23/15   Costin Karlyne Greenspan, MD  simvastatin (ZOCOR) 80 MG tablet Take 80 mg by mouth at bedtime.    Historical Provider, MD    Allergies Patient has no known allergies.   REVIEW OF SYSTEMS  Negative except as noted here or  in the History of Present Illness.   PHYSICAL EXAMINATION  Initial Vital Signs Blood pressure 127/92, pulse 85, temperature 98.8 F (37.1 C), temperature source Oral, resp. rate 18, height 6' (1.829 m), weight 185 lb (83.9 kg), SpO2 93 %.  Examination General: Well-developed, well-nourished male in no acute distress; appearance consistent with age of record HENT: normocephalic; atraumatic; breath smells of alcohol  Eyes: pupils equal, round and reactive to light; extraocular muscles intact Neck: supple Heart: regular rate and rhythm; no murmurs, rubs or gallops Lungs: clear to auscultation bilaterally Abdomen: soft; nondistended; nontender; no masses or hepatosplenomegaly; bowel sounds present Extremities: Angulated deformity of the  right mid-forearm with overlying abrasion; Radial pulse 2+ distally; ulnar pulse weak distally; capillary refill is less than two seconds; sensation intact distally; tendon function grossly intact, but limited by pain   Neurologic: Awake, alert and oriented; motor function intact in all extremities and symmetric; no facial droop Skin: Warm and dry Psychiatric: Normal mood and affect   RESULTS  Summary of this visit's results, reviewed by myself:   EKG Interpretation  Date/Time:  Wednesday December 31 2016 03:37:29 EDT Ventricular Rate:  97 PR Interval:    QRS Duration: 89 QT Interval:  345 QTC Calculation: 439 R Axis:   4 Text Interpretation:  Sinus rhythm Prolonged PR interval Consider left atrial enlargement Probable anteroseptal infarct, recent Rate is faster Lateral T-wave inversions less pronounced Confirmed by Quantisha Marsicano  MD, Jenny Reichmann ([id number]) on 12/31/2016 3:51:55 AM      Laboratory Studies: Results for orders placed or performed during the hospital encounter of 12/31/16 (from the past 24 hour(s))  Ethanol     Status: Abnormal   Collection Time: 12/31/16  1:55 AM  Result Value Ref Range   Alcohol, Ethyl (B) 281 (H) <5 mg/dL  CBC with Differential/Platelet     Status: Abnormal   Collection Time: 12/31/16  1:55 AM  Result Value Ref Range   WBC 7.7 4.0 - 10.5 K/uL   RBC 4.00 (L) 4.22 - 5.81 MIL/uL   Hemoglobin 13.4 13.0 - 17.0 g/dL   HCT 37.2 (L) 39.0 - 52.0 %   MCV 93.0 78.0 - 100.0 fL   MCH 33.5 26.0 - 34.0 pg   MCHC 36.0 30.0 - 36.0 g/dL   RDW 13.9 11.5 - 15.5 %   Platelets 187 150 - 400 K/uL   Neutrophils Relative % 61 %   Neutro Abs 4.7 1.7 - 7.7 K/uL   Lymphocytes Relative 30 %   Lymphs Abs 2.3 0.7 - 4.0 K/uL   Monocytes Relative 8 %   Monocytes Absolute 0.6 0.1 - 1.0 K/uL   Eosinophils Relative 1 %   Eosinophils Absolute 0.0 0.0 - 0.7 K/uL   Basophils Relative 0 %   Basophils Absolute 0.0 0.0 - 0.1 K/uL  Basic metabolic panel     Status: Abnormal   Collection Time:  12/31/16  1:55 AM  Result Value Ref Range   Sodium 126 (L) 135 - 145 mmol/L   Potassium 3.0 (L) 3.5 - 5.1 mmol/L   Chloride 88 (L) 101 - 111 mmol/L   CO2 24 22 - 32 mmol/L   Glucose, Bld 267 (H) 65 - 99 mg/dL   BUN 21 (H) 6 - 20 mg/dL   Creatinine, Ser 1.29 (H) 0.61 - 1.24 mg/dL   Calcium 9.9 8.9 - 10.3 mg/dL   GFR calc non Af Amer >60 >60 mL/min   GFR calc Af Amer >60 >60 mL/min   Anion gap 14  5 - 15   Imaging Studies: Dg Forearm Right  Result Date: 12/31/2016 CLINICAL DATA:  Pain and deformity of the right forearm after fall down steps today. EXAM: RIGHT FOREARM - 2 VIEW COMPARISON:  None. FINDINGS: Transverse fractures of the mid shafts right radius and ulna with mild comminution. Right radial fracture demonstrates full shaft width radial side displacement of the distal fracture fragment with about 1 cm overriding and ulnar angulation. The ulnar fracture demonstrates full shaft width radial side displacement of the distal fracture fragment with about 2 cm overriding and ulnar angulation. There is associated soft tissue swelling. The wrist and elbow appear intact without obvious dislocation. IMPRESSION: Transverse mildly comminuted fractures of the midshaft right radius and right ulna with radial displacement and overriding and ulnar angulation of the distal fracture fragments. Electronically Signed   By: Lucienne Capers M.D.   On: 12/31/2016 02:47    ED COURSE  Nursing notes and initial vitals signs, including pulse oximetry, reviewed.  Vitals:   12/31/16 0059 12/31/16 0124 12/31/16 0228 12/31/16 0328  BP: 127/92  (!) 144/107 (!) 140/101  Pulse: 85  93 100  Resp: 18   18  Temp: 98.8 F (37.1 C)   98.6 F (37 C)  TempSrc: Oral   Oral  SpO2: 93%  99% 95%  Weight:  185 lb (83.9 kg)    Height:  6' (1.829 m)     3:18 AM Dr. Amedeo Plenty to see patient in ED. Patient's fingers remain neurovascularly intact.  PROCEDURES    ED DIAGNOSES     ICD-9-CM ICD-10-CM   1. Fall at home,  initial encounter [phone] W19.XXXA    E849.0 Y92.099   2. Closed displaced comminuted fracture of shaft of right radius, initial encounter 813.21 S52.351A   3. Closed displaced comminuted fracture of shaft of right ulna, initial encounter 813.22 S52.251A   4. Alcoholic intoxication without complication (HCC) 797.28 F10.920   5. Abrasion of right forearm, initial encounter 913.0 S50.811A     I personally performed the services described in this documentation, which was scribed in my presence. The recorded information has been reviewed and is accurate.     Shanon Rosser, MD 12/31/16 [place], MD 12/31/16 [phone]

## 2016-12-31 NOTE — Op Note (Signed)
See [id number] Amedeo Plenty MD

## 2016-12-31 NOTE — Transfer of Care (Signed)
Immediate Anesthesia Transfer of Care Note  Patient: [name]  Procedure(s) Performed: Procedure(s): OPEN REDUCTION INTERNAL FIXATION (ORIF) both bone forearm fracture (Right)  Patient Location: PACU  Anesthesia Type:General  Level of Consciousness: awake  Airway & Oxygen Therapy: Patient Spontanous Breathing and Patient connected to face mask oxygen  Post-op Assessment: Report given to RN and Post -op Vital signs reviewed and stable  Post vital signs: Reviewed and stable  Last Vitals:  Vitals:   12/31/16 0228 12/31/16 0328  BP: (!) 144/107 (!) 140/101  Pulse: 93 100  Resp:  18  Temp:  37 C    Last Pain:  Vitals:   12/31/16 0350  TempSrc:   PainSc: 3          Complications: No apparent anesthesia complications

## 2016-12-31 NOTE — ED Triage Notes (Signed)
Pt slipped on carpeted stairs and caught self w/R arm.  No neck, back, or hip pain.  Small abrasion R arm, no bleeding.  Deformity R arm, pain, pulses palpable, full sensation.  100 mcg fetanyl. VS: 140/90, 80 bpm, 98% RA

## 2016-12-31 NOTE — H&P (Signed)
Reason for Consult: Right displaced both bone forearm fracture after a fall tonight Referring Physician: ER staff  [name] is an 51 y.o. male.  HPI: 77 old male status post fall tonight. He has displaced both bone forearm fracture. It is difficult for him to move his fingers. He has obvious deformity. He is alert and oriented despite some alcohol usage tonight of course.  He is alert to time place and every bit of his surroundings. He is appropriate and axial pleasant man did talk to.  He denies abdominal pain chest pain. He denies headache. He denies lower extremity pain. After coughing he experiences little bit of pain in his right side.  I reviewed these issues with him at length and the relevant findings.  He is alert and understands the proposed treatment I've discussed.  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  Past Medical History:  Diagnosis Date  . Acid reflux   . Concussion   . Coronary artery disease   . Diabetes ([place])   . ETOH abuse   . High cholesterol   . Hypertension   . MI (myocardial infarction)    2012  . Sleep apnea    USES CPAP AS NEEDED  . Tobacco abuse     Past Surgical History:  Procedure Laterality Date  . stents      Family History  Problem Relation Age of Onset  . Diabetes Mother     Social History:  reports that he has been smoking Cigarettes.  He has a 15.00 pack-year smoking history. He has never used smokeless tobacco. He reports that he drinks about 3.6 oz of alcohol per week . He reports that he does not use drugs.  Allergies: No Known Allergies  Medications: I have reviewed the patient's current medications.  Results for orders placed or performed during the hospital encounter of 12/31/16 (from the past 48 hour(s))  Ethanol     Status: Abnormal   Collection Time: 12/31/16  1:55 AM  Result Value Ref Range   Alcohol, Ethyl (B) 281 (H) <5 mg/dL    Comment:        LOWEST DETECTABLE LIMIT FOR SERUM ALCOHOL IS 5 mg/dL FOR  MEDICAL PURPOSES ONLY   CBC with Differential/Platelet     Status: Abnormal   Collection Time: 12/31/16  1:55 AM  Result Value Ref Range   WBC 7.7 4.0 - 10.5 K/uL   RBC 4.00 (L) 4.22 - 5.81 MIL/uL   Hemoglobin 13.4 13.0 - 17.0 g/dL   HCT 37.2 (L) 39.0 - 52.0 %   MCV 93.0 78.0 - 100.0 fL   MCH 33.5 26.0 - 34.0 pg   MCHC 36.0 30.0 - 36.0 g/dL   RDW 13.9 11.5 - 15.5 %   Platelets 187 150 - 400 K/uL   Neutrophils Relative % 61 %   Neutro Abs 4.7 1.7 - 7.7 K/uL   Lymphocytes Relative 30 %   Lymphs Abs 2.3 0.7 - 4.0 K/uL   Monocytes Relative 8 %   Monocytes Absolute 0.6 0.1 - 1.0 K/uL   Eosinophils Relative 1 %   Eosinophils Absolute 0.0 0.0 - 0.7 K/uL   Basophils Relative 0 %   Basophils Absolute 0.0 0.0 - 0.1 K/uL  Basic metabolic panel     Status: Abnormal   Collection Time: 12/31/16  1:55 AM  Result Value Ref Range   Sodium 126 (L) 135 - 145 mmol/L   Potassium 3.0 (L) 3.5 - 5.1 mmol/L   Chloride 88 (L)  101 - 111 mmol/L   CO2 24 22 - 32 mmol/L   Glucose, Bld 267 (H) 65 - 99 mg/dL   BUN 21 (H) 6 - 20 mg/dL   Creatinine, Ser 1.29 (H) 0.61 - 1.24 mg/dL   Calcium 9.9 8.9 - 10.3 mg/dL   GFR calc non Af Amer >60 >60 mL/min   GFR calc Af Amer >60 >60 mL/min    Comment: (NOTE) The eGFR has been calculated using the CKD EPI equation. This calculation has not been validated in all clinical situations. eGFR's persistently <60 mL/min signify possible Chronic Kidney Disease.    Anion gap 14 5 - 15    Dg Forearm Right  Result Date: 12/31/2016 CLINICAL DATA:  Pain and deformity of the right forearm after fall down steps today. EXAM: RIGHT FOREARM - 2 VIEW COMPARISON:  None. FINDINGS: Transverse fractures of the mid shafts right radius and ulna with mild comminution. Right radial fracture demonstrates full shaft width radial side displacement of the distal fracture fragment with about 1 cm overriding and ulnar angulation. The ulnar fracture demonstrates full shaft width radial side  displacement of the distal fracture fragment with about 2 cm overriding and ulnar angulation. There is associated soft tissue swelling. The wrist and elbow appear intact without obvious dislocation. IMPRESSION: Transverse mildly comminuted fractures of the midshaft right radius and right ulna with radial displacement and overriding and ulnar angulation of the distal fracture fragments. Electronically Signed   By: Lucienne Capers M.D.   On: 12/31/2016 02:47    Review of Systems  Constitutional: Negative.   HENT: Negative.   Eyes: Negative.   Respiratory: Negative.   Cardiovascular: Negative.   Gastrointestinal: Negative.   Endo/Heme/Allergies: Negative.    Blood pressure (!) 140/101, pulse 100, temperature 98.6 F (37 C), temperature source Oral, resp. rate 18, height 6' (1.829 m), weight 83.9 kg (185 lb), SpO2 95 %. Physical Exam black male alert and oriented in no acute distress. This patient has intact sensation to the fingers but cannot move the fingers. No obvious elbow trauma but he does have a very angulated and displaced both bone forearm fracture. He has a thready radial pulse.  He has refill to the fingers. There is no evidence of infection.  Opposite left upper extremity is neurovascularly intact. His lower stem examination is intact to sensation and motor function. He can perform a straight leg raise without difficulty.  Abdomen is nontender nondistended chest is clear with equal chest expansion heart is regular rate.  HEENT is within normal limits.  He has no palpable tenderness over his back although he felt some pain when he coughed on the right side.  I reviewed his x-rays that show a displaced both bone forearm fracture radius and ulna midshaft  Assessment/Plan: Given the displacement inability to move the fingers deformity and pain level where recommending ORIF. Patient agrees. Patient to my opinion and in that opinion of the ER staff is highly consentable. He is awake  lucid oriented. Does have consented him for ORIF right both bone forearm fracture with repair reconstruction is necessary. Patient understands risk and benefits and desires to proceed.  We are planning surgery for your upper extremity. The risk and benefits of surgery to include risk of bleeding, infection, anesthesia,  damage to normal structures and failure of the surgery to accomplish its intended goals of relieving symptoms and restoring function have been discussed in detail. With this in mind we plan to proceed. I have specifically discussed with  the patient the pre-and postoperative regime and the dos and don'ts and risk and benefits in great detail. Risk and benefits of surgery also include risk of dystrophy(CRPS), chronic nerve pain, failure of the healing process to go onto completion and other inherent risks of surgery The relavent the pathophysiology of the disease/injury process, as well as the alternatives for treatment and postoperative course of action has been discussed in great detail with the patient who desires to proceed.  We will do everything in our power to help you (the patient) restore function to the upper extremity. It is a pleasure to see this patient today.   Paulene Floor 12/31/2016, 4:17 AM

## 2016-12-31 NOTE — Consult Note (Signed)
[facility] Medical Consultation  Omarion Minnehan AJG:[id number] DOB: 05-27-1966 DOA: 12/31/2016 PCP: Charolette Forward, MD   Requesting physician: Dr. Amedeo Plenty  Date of consultation: 12/31/2016  Reason for consultation: HTN and alcohol withdrawal management  Impression/Recommendations Active Problems:   Hypertension   GERD (gastroesophageal reflux disease)   ETOH abuse   Diabetes mellitus with complication ([place])   Forearm fracture, right, closed, initial encounter  Complex comminuted fracture of both bones of the right forearm - s/p surgical repar Differ to orthopedic service  HTN - BP was mildy accelerated on admission and could be secondary to pain Will d/c [place] as patient is on HCTZ already, which is the same class of thiazide diuretics and currently has elevated ceratinine - 1.29.  Add Hydralazine prn Continue coreg and HCTZ  Acute kidney injury without previous history of CKD Hold Lisinopril, the dose of thiazide diuretic was reduced in half Monitor renal indices and restart ACE-I when creatinine returns to baseline  Polysubstance abuse - ongoing tobacco with alcohol abuse Alcohol level on presentation was 281 Will start CIWA protocol and place Nicoderm patch  GERD - continue pepcid  DM type II Will add SSI, continue to monitor CBG's  Hypokalemia Replace and recheck  We will followup again tomorrow. Please contact me if I can be of assistance in the meanwhile. Thank you for this consultation.  Chief Complaint: right arm pain  HPI: [name], 51 year old male with previous medical history of GERD, CAD with MI in 2012, diabetes mellitus, hypertension, hyperlipidemia obstructive sleep apnea noncompliant with CPAP, polysubstance abuse-alcohol and tobacco who presented to the emergency department with complaints of right arm pain after he sustained a fall on the carpeted stairs. Patient reported that he slipped on hypertension fell down the stairs catching himself  with the right arm presented with right arm deformity and swelling X-ray of the right upper extremity revealed midshaft right radius fracture with radial displacement and overriding, right ulnar fracture with ulnar angulation of the distal fracture fragments Patient was seen by work to [employer] underwent surgical repair of the fracture of the both forearm bones this morning We were asked to see him in consultation to help with management of HTN and alcohol withdrawal  Review of Systems: unable to obtain as patient is drawsy after general anesthesia   Past Medical History:  Diagnosis Date  . Acid reflux   . Concussion   . Coronary artery disease   . Diabetes ([place])   . ETOH abuse   . High cholesterol   . Hypertension   . MI (myocardial infarction)    2012  . Sleep apnea    USES CPAP AS NEEDED  . Tobacco abuse    Past Surgical History:  Procedure Laterality Date  . stents     Social History:  reports that he has been smoking Cigarettes.  He has a 15.00 pack-year smoking history. He has never used smokeless tobacco. He reports that he drinks about 3.6 oz of alcohol per week . He reports that he does not use drugs.  No Known Allergies Family History  Problem Relation Age of Onset  . Diabetes Mother     Prior to Admission medications   Medication Sig Start Date End Date Taking? Authorizing Provider  aspirin 325 MG tablet Take 325 mg by mouth daily.    Historical Provider, MD  carvedilol (COREG) 25 MG tablet Take 25 mg by mouth 2 (two) times daily with a meal.    Historical Provider, MD  chlorthalidone (HYGROTON)  25 MG tablet Take 25 mg by mouth daily.    Historical Provider, MD  glipiZIDE (GLUCOTROL) 5 MG tablet Take 5 mg by mouth 2 (two) times daily. 30 minutes prior to a meal    Historical Provider, MD  hydrochlorothiazide (HYDRODIURIL) 25 MG tablet Take 1 tablet (25 mg total) by mouth daily. 05/31/16   Tatyana Kirichenko, PA-C  HYDROcodone-acetaminophen (NORCO/VICODIN) 5-325 MG  tablet Take 1-2 tablets by mouth every 4 (four) hours as needed for moderate pain. 08/23/15   Costin Karlyne Greenspan, MD  lisinopril (PRINIVIL,ZESTRIL) 40 MG tablet Take 40 mg by mouth daily.    Historical Provider, MD  meclizine (ANTIVERT) 12.5 MG tablet Take 2 tablets (25 mg total) by mouth 3 (three) times daily as needed for dizziness. 04/27/15   Robyn M Hess, PA-C  nicotine (NICODERM CQ - DOSED IN MG/24 HOURS) 21 mg/24hr patch Place 1 patch (21 mg total) onto the skin daily. 08/23/15   Costin Karlyne Greenspan, MD  omeprazole (PRILOSEC) 20 MG capsule Take 1 capsule (20 mg total) by mouth daily. 08/23/15   Costin Karlyne Greenspan, MD  simvastatin (ZOCOR) 80 MG tablet Take 80 mg by mouth at bedtime.    Historical Provider, MD   Physical Exam: Blood pressure 109/67, pulse 78, temperature 97 F (36.1 C), resp. rate 16, height 6' (1.829 m), weight 83.9 kg (185 lb), SpO2 98 %. Vitals:   12/31/16 1030 12/31/16 1100  BP: (!) 136/91 109/67  Pulse: 94 78  Resp: 15 16  Temp:       Appears calm and comfortable Eyes: PERRLA, EOMI, normal lids, iris ENT:  grossly normal hearing, lips & tongue, mucous membranes moist and intact Neck: no lymphoadenopathy, masses or thyromegaly Cardiovascular: RRR, no m/r/g. No JVD, carotid bruits. No LE edema.  Respiratory: bilateral no wheezes, rales, rhonchi or cracles. Normal respiratory effort. No accessory muscle use observed Abdomen: soft, non-tender, non-distended, no organomegaly or masses appreciated. BS present in all quadrants Skin: no rash, ulcers or induration seen on limited exam Musculoskeletal: grossly normal muscle tone with good ROM, RUE in dependent position with surgical dressing intact, bony abnormality or joint deformities observed Psychiatric: grossly normal mood and affect, speech fluent and appropriate, alert and oriented x3 Neurologic: CN II-XII grossly intact, moves all extremities in coordinated fashion, sensation intact   Labs on Admission:  Basic Metabolic  Panel:  Recent Labs Lab 12/31/16 0155  NA 126*  K 3.0*  CL 88*  CO2 24  GLUCOSE 267*  BUN 21*  CREATININE 1.29*  CALCIUM 9.9   Liver Function Tests: No results for input(s): AST, ALT, ALKPHOS, BILITOT, PROT, ALBUMIN in the last 168 hours. No results for input(s): LIPASE, AMYLASE in the last 168 hours. No results for input(s): AMMONIA in the last 168 hours. CBC:  Recent Labs Lab 12/31/16 0155  WBC 7.7  NEUTROABS 4.7  HGB 13.4  HCT 37.2*  MCV 93.0  PLT 187   Cardiac Enzymes: No results for input(s): CKTOTAL, CKMB, CKMBINDEX, TROPONINI in the last 168 hours. BNP: Invalid input(s): POCBNP CBG:  Recent Labs Lab 12/31/16 0655  GLUCAP 185*    Radiological Exams on Admission: Dg Forearm Right  Result Date: 12/31/2016 CLINICAL DATA:  Pain and deformity of the right forearm after fall down steps today. EXAM: RIGHT FOREARM - 2 VIEW COMPARISON:  None. FINDINGS: Transverse fractures of the mid shafts right radius and ulna with mild comminution. Right radial fracture demonstrates full shaft width radial side displacement of the distal fracture fragment with  about 1 cm overriding and ulnar angulation. The ulnar fracture demonstrates full shaft width radial side displacement of the distal fracture fragment with about 2 cm overriding and ulnar angulation. There is associated soft tissue swelling. The wrist and elbow appear intact without obvious dislocation. IMPRESSION: Transverse mildly comminuted fractures of the midshaft right radius and right ulna with radial displacement and overriding and ulnar angulation of the distal fracture fragments. Electronically Signed   By: Lucienne Capers M.D.   On: 12/31/2016 02:47    EKG: Independently reviewed - Sinus rhythm  Time spent: 65 minutes  [facility] Pager [phone]  If 7PM-7AM, please contact night-coverage [email or web address] Password [facility] 12/31/2016, 11:04 AM

## 2016-12-31 NOTE — Anesthesia Procedure Notes (Signed)
Procedure Name: Intubation Date/Time: 12/31/2016 5:04 AM Performed by: Jake Goodson S Pre-anesthesia Checklist: Patient identified, Emergency Drugs available, Suction available, Patient being monitored and Timeout performed Patient Re-evaluated:Patient Re-evaluated prior to inductionOxygen Delivery Method: Circle system utilized Preoxygenation: Pre-oxygenation with 100% oxygen Intubation Type: IV induction Ventilation: Mask ventilation without difficulty Laryngoscope Size: Mac and 4 Grade View: Grade I Tube type: Oral Tube size: 7.5 mm Number of attempts: 1 Airway Equipment and Method: Stylet Placement Confirmation: ETT inserted through vocal cords under direct vision,  positive ETCO2 and breath sounds checked- equal and bilateral Secured at: 23 cm Tube secured with: Tape Dental Injury: Teeth and Oropharynx as per pre-operative assessment

## 2016-12-31 NOTE — Anesthesia Preprocedure Evaluation (Signed)
Anesthesia Evaluation  Patient identified by MRN, date of birth, ID band Patient awake    Reviewed: Allergy & Precautions, NPO status , Patient's Chart, lab work & pertinent test results  Airway Mallampati: II  TM Distance: >3 FB Neck ROM: Full    Dental  (+) Dental Advisory Given   Pulmonary sleep apnea , Current Smoker,    breath sounds clear to auscultation       Cardiovascular hypertension, Pt. on medications and Pt. on home beta blockers + CAD, + Past MI and + Cardiac Stents   Rhythm:Regular Rate:Normal     Neuro/Psych negative neurological ROS     GI/Hepatic GERD  ,(+)     substance abuse  alcohol use,   Endo/Other  diabetes  Renal/GU CRFRenal disease     Musculoskeletal   Abdominal   Peds  Hematology negative hematology ROS (+)   Anesthesia Other Findings   Reproductive/Obstetrics                             Anesthesia Physical Anesthesia Plan  ASA: III  Anesthesia Plan: General   Post-op Pain Management:    Induction: Intravenous and Rapid sequence  Airway Management Planned: Oral ETT  Additional Equipment:   Intra-op Plan:   Post-operative Plan: Extubation in OR  Informed Consent: I have reviewed the patients History and Physical, chart, labs and discussed the procedure including the risks, benefits and alternatives for the proposed anesthesia with the patient or authorized representative who has indicated his/her understanding and acceptance.   Dental advisory given  Plan Discussed with: CRNA  Anesthesia Plan Comments:         Anesthesia Quick Evaluation

## 2017-01-01 ENCOUNTER — Encounter (HOSPITAL_COMMUNITY): Payer: Self-pay | Admitting: Orthopedic Surgery

## 2017-01-01 DIAGNOSIS — W19XXXA Unspecified fall, initial encounter: Secondary | ICD-10-CM | POA: Diagnosis not present

## 2017-01-01 DIAGNOSIS — F10920 Alcohol use, unspecified with intoxication, uncomplicated: Secondary | ICD-10-CM

## 2017-01-01 DIAGNOSIS — S5291XA Unspecified fracture of right forearm, initial encounter for closed fracture: Secondary | ICD-10-CM | POA: Diagnosis not present

## 2017-01-01 DIAGNOSIS — E118 Type 2 diabetes mellitus with unspecified complications: Secondary | ICD-10-CM | POA: Diagnosis not present

## 2017-01-01 DIAGNOSIS — Y92099 Unspecified place in other non-institutional residence as the place of occurrence of the external cause: Secondary | ICD-10-CM | POA: Diagnosis not present

## 2017-01-01 DIAGNOSIS — S52251A Displaced comminuted fracture of shaft of ulna, right arm, initial encounter for closed fracture: Secondary | ICD-10-CM | POA: Diagnosis not present

## 2017-01-01 DIAGNOSIS — S52351A Displaced comminuted fracture of shaft of radius, right arm, initial encounter for closed fracture: Secondary | ICD-10-CM | POA: Diagnosis not present

## 2017-01-01 LAB — GLUCOSE, CAPILLARY
Glucose-Capillary: 222 mg/dL — ABNORMAL HIGH (ref 65–99)
Glucose-Capillary: 215 mg/dL — ABNORMAL HIGH (ref 65–99)
Glucose-Capillary: 218 mg/dL — ABNORMAL HIGH (ref 65–99)
Glucose-Capillary: 220 mg/dL — ABNORMAL HIGH (ref 65–99)

## 2017-01-01 LAB — BASIC METABOLIC PANEL
ANION GAP: 6 (ref 5–15)
BUN: 9 mg/dL (ref 6–20)
CALCIUM: 9.4 mg/dL (ref 8.9–10.3)
CO2: 28 mmol/L (ref 22–32)
Chloride: 96 mmol/L — ABNORMAL LOW (ref 101–111)
Creatinine, Ser: 1.1 mg/dL (ref 0.61–1.24)
Glucose, Bld: 181 mg/dL — ABNORMAL HIGH (ref 65–99)
Potassium: 3.9 mmol/L (ref 3.5–5.1)
SODIUM: 130 mmol/L — AB (ref 135–145)

## 2017-01-01 MED ORDER — OXYCODONE HCL 5 MG PO TABS: 10.0000 mg | ORAL_TABLET | ORAL | 0 refills | Status: DC | PRN

## 2017-01-01 NOTE — Progress Notes (Signed)
Patient has been seen and examined. Patient has pain appropriate to his injury/process. Patient denies new complaints at this present time. I have discussed the care pathway with nursing staff. Patient is appropriate and alert.  We reviewed vital signs and intake output which are stable.  The upper extremity is neurovascularly intact. Refill is normal. There is no signs of compartment syndrome. There is no signs of dystrophy. There is normal sensation.  I have spent a  great deal of time discussing range of motion edema control and other techniques to decrease edema and promote flexion extension of the fingers. Patient understands the importance of elevation range of motion massage and other measures to lessen pain and prevent swelling.  We have also discussed immobilization to appropriate areas involved.  We have discussed with the patient shoulder range of motion to prevent adhesive capsulitis.  The remainder of the examination is normal today without complicating feature.    Patient will be discharged hometomorrow. Will plan to see the patient back in the office as per discharge instructions (please see discharge instructions).  Patient had an uneventful hospital course.Patient will notify should have problems occur. There is no signs of DVT infection or other complication at this juncture.  we appreciate the help of medicine-the patient is aware of changes which he needs to make. All questions have been incurred and answered.  Please see discharge med list  Merrianne Mccumbers MDPatient ID: [name], male   DOB: [birth year], 51 y.o.   MRN: [id number]

## 2017-01-01 NOTE — Evaluation (Signed)
Occupational Therapy Evaluation Patient Details Name: [name] MRN: [id number] DOB: [birth year] Today's Date: 01/01/2017    History of Present Illness s/p fall with R forearm fx and ORIF. PMH:  HTN, DM, tobacco and alcohol abuse, MI, OSA.   Clinical Impression   Pt educated in edema management and compensatory strategies for ADL with pt able to demonstrate understanding of instruction. Pt will have intermittent assistance of his girlfriend upon discharge. Pt with concerns about his elevated blood sugar. No further OT needs.   Follow Up Recommendations  No OT follow up    Equipment Recommendations  None recommended by OT    Recommendations for Other Services       Precautions / Restrictions Restrictions Weight Bearing Restrictions: Yes RUE Weight Bearing: Weight bearing as tolerated      Mobility Bed Mobility Overal bed mobility: Independent                Transfers Overall transfer level: Independent Equipment used: None                  Balance Overall balance assessment: Independent                                          ADL                                         General ADL Comments: Pt requiring minimum assistance of his caregiver for standing bathing and dressing at sink upon OTs arrival. Educated in compensatory strategies for ADL. Instructed in elevation of R UE above heart, icing and moving uninvolved joints of R UE to decrease edema.     Vision Baseline Vision/History: Wears glasses Wears Glasses: At all times Patient Visual Report: No change from baseline       Perception     Praxis      Pertinent Vitals/Pain Pain Assessment: 0-10 Pain Score: 8  Pain Location: R UE Pain Descriptors / Indicators: Aching Pain Intervention(s): Monitored during session;Premedicated before session;Repositioned;Ice applied     Hand Dominance Right   Extremity/Trunk Assessment Upper Extremity Assessment Upper  Extremity Assessment: RUE deficits/detail RUE Deficits / Details: splinted from MPs to proximal of elbow RUE: Unable to fully assess due to immobilization RUE Coordination: decreased fine motor;decreased gross motor   Lower Extremity Assessment Lower Extremity Assessment: Overall WFL for tasks assessed   Cervical / Trunk Assessment Cervical / Trunk Assessment: Normal   Communication Communication Communication: No difficulties   Cognition Arousal/Alertness: Awake/alert Behavior During Therapy: WFL for tasks assessed/performed Overall Cognitive Status: Within Functional Limits for tasks assessed                     General Comments       Exercises       Shoulder Instructions      Home Living Family/patient expects to be discharged to:: Private residence Living Arrangements: Alone Available Help at Discharge: Friend(s);Available PRN/intermittently Type of Home: Apartment Home Access: Stairs to enter     Home Layout: Two level Alternate Level Stairs-Number of Steps: flight   Bathroom Shower/Tub: Teacher, early years/pre: Standard     Home Equipment: None          Prior Functioning/Environment Level of Independence: Independent  OT Problem List: Pain;Impaired UE functional use      OT Treatment/Interventions:      OT Goals(Current goals can be found in the care plan section) Acute Rehab OT Goals Patient Stated Goal: to return home  OT Frequency:     Barriers to D/C:            Co-evaluation              End of Session    Activity Tolerance: Patient tolerated treatment well Patient left: in chair;with call bell/phone within reach  OT Visit Diagnosis: Pain Pain - Right/Left: Right Pain - part of body: Arm                ADL either performed or assessed with clinical judgement  Time: 0930-    Charges:  OT General Charges $OT Visit: 1 Procedure OT Evaluation $OT Eval Low Complexity: 1 Procedure OT  Treatments $Self Care/Home Management : 8-22 mins G-Codes: OT G-codes **NOT FOR INPATIENT CLASS** Functional Assessment Tool Used: Clinical judgement Functional Limitation: Self care Self Care Current Status (E9528): At least 1 percent but less than 20 percent impaired, limited or restricted Self Care Discharge Status [phone]): At least 1 percent but less than 20 percent impaired, limited or restricted    Malka So 01/01/2017, 9:42 AM  [phone]

## 2017-01-01 NOTE — Discharge Instructions (Signed)

## 2017-01-01 NOTE — Progress Notes (Signed)
PROGRESS NOTE    [name]  GMW:[id number] DOB: [birth year] DOA: 12/31/2016 PCP: Charolette Forward, MD    Brief Narrative:  51 year old male with previous medical history of GERD, CAD with MI in 2012, diabetes mellitus, hypertension, hyperlipidemia obstructive sleep apnea noncompliant with CPAP, polysubstance abuse-alcohol and tobacco who presented to the emergency department with complaints of right arm pain after he sustained a fall on the carpeted stairs. Patient reported a mechanical fall down the stairs catching himself with the right arm presented with right arm deformity and swelling X-ray of the right upper extremity revealed midshaft right radius fracture with radial displacement and overriding, right ulnar fracture with ulnar angulation of the distal fracture fragments Patient was seen by Orthopedic Surgery and underwent surgical repair of the fracture of the both forearm bones on 3/14 Hospitalist service was consulted for management of HTN and alcohol withdrawal  Assessment & Plan:   Active Problems:   Hypertension   GERD (gastroesophageal reflux disease)   ETOH abuse   Diabetes mellitus with complication ([facility])   Forearm fracture, right, closed, initial encounter   Hypokalemia   Closed displaced comminuted fracture of shaft of right radius   Fall at home, initial encounter  Complex comminuted fracture of both bones of the right forearm - Patient is now s/p surgical repar  HTN  - BP was mildly elevated at time of consultation, suspect secondary to acute pain from fracture - BP currently much improved  - Continue on Hydralazine prn - Continue coreg  Acute kidney injury without previous history of CKD - Holding Lisinopril, the dose of thiazide diuretic was reduced in half - Renal function improved - Recommend resuming home meds on time of discharge  Polysubstance abuse - ongoing tobacco with alcohol abuse Alcohol level on presentation was 281. Pt claims last ETOH  intake was on 3/12 after drinking "a pint" Continue CIWA protocol and place Nicoderm patch  GERD - continue pepcid. Stable at present  DM type II Continue SSI, continue to monitor CBG's Home DM meds resumed per surgical service  Hypokalemia Replaced -repeat bmet in AM  DVT prophylaxis: SCD's Code Status: Full Family Communication: Pt in room, family not at bedside Disposition Plan: Per primary service  Consultants:     Procedures:     Antimicrobials: Anti-infectives    Start     Dose/Rate Route Frequency Ordered Stop   12/31/16 1700  ceFAZolin (ANCEF) IVPB 1 g/50 mL premix     1 g 100 mL/hr over 30 Minutes Intravenous Every 8 hours 12/31/16 0921 01/01/17 0704   12/31/16 0930  ceFAZolin (ANCEF) IVPB 1 g/50 mL premix     1 g 100 mL/hr over 30 Minutes Intravenous NOW 12/31/16 0921 12/31/16 1000   12/31/16 0925  ceFAZolin (ANCEF) 1 GM/50ML IVPB    Comments:  Moring, Taylor   : cabinet override      12/31/16 0925 12/31/16 0930       Subjective: No complaints  Objective: Vitals:   12/31/16 1208 12/31/16 2238 01/01/17 0233 01/01/17 0532  BP: 132/81 (!) 145/83 130/89 129/82  Pulse: 87 76 82 74  Resp: 18 19 18 18   Temp: 98.9 F (37.2 C) 99 F (37.2 C) 98.8 F (37.1 C) 99 F (37.2 C)  TempSrc: Oral Oral Oral Oral  SpO2: 98% 98% 94% 100%  Weight:      Height:        Intake/Output Summary (Last 24 hours) at 01/01/17 1508 Last data filed at 01/01/17 0909  Gross per  24 hour  Intake              360 ml  Output             1200 ml  Net             -840 ml   Filed Weights   12/31/16 0124  Weight: 83.9 kg (185 lb)    Examination:  General exam: Appears calm and comfortable  Respiratory system: Clear to auscultation. Respiratory effort normal. Cardiovascular system: S1 & S2 heard, RRR. No JVD, murmurs, rubs, gallops or clicks. No pedal edema. Gastrointestinal system: Abdomen is nondistended, soft and nontender. No organomegaly or masses felt. Normal  bowel sounds heard. Central nervous system: Alert and oriented. No focal neurological deficits. Extremities: Symmetric 5 x 5 power. Skin: No rashes, lesions Psychiatry: Judgement and insight appear normal. Mood & affect appropriate.   Data Reviewed: I have personally reviewed following labs and imaging studies  CBC:  Recent Labs Lab 12/31/16 0155  WBC 7.7  NEUTROABS 4.7  HGB 13.4  HCT 37.2*  MCV 93.0  PLT 378   Basic Metabolic Panel:  Recent Labs Lab 12/31/16 0155 01/01/17 0450  NA 126* 130*  K 3.0* 3.9  CL 88* 96*  CO2 24 28  GLUCOSE 267* 181*  BUN 21* 9  CREATININE 1.29* 1.10  CALCIUM 9.9 9.4   GFR: Estimated Creatinine Clearance: 89.2 mL/min (by C-G formula based on SCr of 1.1 mg/dL). Liver Function Tests: No results for input(s): AST, ALT, ALKPHOS, BILITOT, PROT, ALBUMIN in the last 168 hours. No results for input(s): LIPASE, AMYLASE in the last 168 hours. No results for input(s): AMMONIA in the last 168 hours. Coagulation Profile: No results for input(s): INR, PROTIME in the last 168 hours. Cardiac Enzymes: No results for input(s): CKTOTAL, CKMB, CKMBINDEX, TROPONINI in the last 168 hours. BNP (last 3 results) No results for input(s): PROBNP in the last 8760 hours. HbA1C: No results for input(s): HGBA1C in the last 72 hours. CBG:  Recent Labs Lab 12/31/16 1319 12/31/16 1651 12/31/16 2246 01/01/17 0744 01/01/17 1156  GLUCAP 150* 161* 198* 222* 215*   Lipid Profile: No results for input(s): CHOL, HDL, LDLCALC, TRIG, CHOLHDL, LDLDIRECT in the last 72 hours. Thyroid Function Tests: No results for input(s): TSH, T4TOTAL, FREET4, T3FREE, THYROIDAB in the last 72 hours. Anemia Panel: No results for input(s): VITAMINB12, FOLATE, FERRITIN, TIBC, IRON, RETICCTPCT in the last 72 hours. Sepsis Labs: No results for input(s): PROCALCITON, LATICACIDVEN in the last 168 hours.  No results found for this or any previous visit (from the past 240 hour(s)).    Radiology Studies: Dg Forearm Right  Result Date: 12/31/2016 CLINICAL DATA:  Pain and deformity of the right forearm after fall down steps today. EXAM: RIGHT FOREARM - 2 VIEW COMPARISON:  None. FINDINGS: Transverse fractures of the mid shafts right radius and ulna with mild comminution. Right radial fracture demonstrates full shaft width radial side displacement of the distal fracture fragment with about 1 cm overriding and ulnar angulation. The ulnar fracture demonstrates full shaft width radial side displacement of the distal fracture fragment with about 2 cm overriding and ulnar angulation. There is associated soft tissue swelling. The wrist and elbow appear intact without obvious dislocation. IMPRESSION: Transverse mildly comminuted fractures of the midshaft right radius and right ulna with radial displacement and overriding and ulnar angulation of the distal fracture fragments. Electronically Signed   By: Lucienne Capers M.D.   On: 12/31/2016 02:47  Scheduled Meds: . aspirin  325 mg Oral Daily  . atorvastatin  40 mg Oral q1800  . carvedilol  25 mg Oral BID WC  . docusate sodium  100 mg Oral BID  . folic acid  1 mg Oral Daily  . glipiZIDE  5 mg Oral BID AC  . insulin aspart  0-9 Units Subcutaneous TID WC  . LORazepam  0-4 mg Intravenous Q6H   Followed by  . [START ON 01/02/2017] LORazepam  0-4 mg Intravenous Q12H  . multivitamin with minerals  1 tablet Oral Daily  . nicotine  14 mg Transdermal Daily  . thiamine  100 mg Oral Daily  . vitamin C  1,000 mg Oral Daily   Continuous Infusions: . 0.45 % NaCl with KCl 20 mEq / L       LOS: 0 days   Osha Errico, Orpah Melter, MD [facility] Pager [phone]  If 7PM-7AM, please contact night-coverage [email or web address] Password [facility] 01/01/2017, 3:08 PM

## 2017-01-01 NOTE — Progress Notes (Signed)
Orthopedic Tech Progress Note Patient Details:  [name] 07-12-1966 [id number]  Ortho Devices Type of Ortho Device: Other (comment) Ortho Device/Splint Location: mission sling Ortho Device/Splint Interventions: Ordered, Application   Karolee Stamps 01/01/2017, 7:23 AM

## 2017-01-02 DIAGNOSIS — S5291XA Unspecified fracture of right forearm, initial encounter for closed fracture: Secondary | ICD-10-CM | POA: Diagnosis not present

## 2017-01-02 DIAGNOSIS — W19XXXA Unspecified fall, initial encounter: Secondary | ICD-10-CM | POA: Diagnosis not present

## 2017-01-02 DIAGNOSIS — S52351A Displaced comminuted fracture of shaft of radius, right arm, initial encounter for closed fracture: Secondary | ICD-10-CM | POA: Diagnosis not present

## 2017-01-02 DIAGNOSIS — S52251A Displaced comminuted fracture of shaft of ulna, right arm, initial encounter for closed fracture: Secondary | ICD-10-CM | POA: Diagnosis not present

## 2017-01-02 LAB — BASIC METABOLIC PANEL
ANION GAP: 11 (ref 5–15)
BUN: 10 mg/dL (ref 6–20)
CALCIUM: 9.4 mg/dL (ref 8.9–10.3)
CO2: 27 mmol/L (ref 22–32)
CREATININE: 0.93 mg/dL (ref 0.61–1.24)
Chloride: 93 mmol/L — ABNORMAL LOW (ref 101–111)
Glucose, Bld: 213 mg/dL — ABNORMAL HIGH (ref 65–99)
Potassium: 3.9 mmol/L (ref 3.5–5.1)
SODIUM: 131 mmol/L — AB (ref 135–145)

## 2017-01-02 LAB — GLUCOSE, CAPILLARY: GLUCOSE-CAPILLARY: 224 mg/dL — AB (ref 65–99)

## 2017-01-02 MED ORDER — MAGNESIUM CITRATE PO SOLN
1.0000 | Freq: Once | ORAL | Status: AC
  Administered 2017-01-02: 1 via ORAL
  Filled 2017-01-02: qty 296

## 2017-01-02 NOTE — Progress Notes (Signed)
PROGRESS NOTE    [name]  HKV:[id number] DOB: 03-26-1966 DOA: 12/31/2016 PCP: Charolette Forward, MD    Brief Narrative:  51 year old male with previous medical history of GERD, CAD with MI in 2012, diabetes mellitus, hypertension, hyperlipidemia obstructive sleep apnea noncompliant with CPAP, polysubstance abuse-alcohol and tobacco who presented to the emergency department with complaints of right arm pain after he sustained a fall on the carpeted stairs. Patient reported a mechanical fall down the stairs catching himself with the right arm presented with right arm deformity and swelling X-ray of the right upper extremity revealed midshaft right radius fracture with radial displacement and overriding, right ulnar fracture with ulnar angulation of the distal fracture fragments Patient was seen by Orthopedic Surgery and underwent surgical repair of the fracture of the both forearm bones on 3/14 Hospitalist service was consulted for management of HTN and alcohol withdrawal  Assessment & Plan:   Active Problems:   Hypertension   GERD (gastroesophageal reflux disease)   ETOH abuse   Diabetes mellitus with complication (HCC)   Forearm fracture, right, closed, initial encounter   Hypokalemia   Closed displaced comminuted fracture of shaft of right radius   Fall at home, initial encounter  Complex comminuted fracture of both bones of the right forearm - Patient is now s/p surgical repar  HTN  - BP was mildly elevated at time of consultation, suspect secondary to acute pain from fracture - BP currently much improved  - Continue on Hydralazine prn - Continue coreg - BP stable  Acute kidney injury without previous history of CKD - Holding Lisinopril, the dose of thiazide diuretic was reduced in half - Renal function improved - Recommend resuming home meds on time of discharge  Polysubstance abuse - ongoing tobacco with alcohol abuse Alcohol level on presentation was 281. Pt claims  last ETOH intake was on 3/12 after drinking "a pint" Continue CIWA protocol and place Nicoderm patch  GERD - continue pepcid. Stable at present  DM type II Continue SSI, continue to monitor CBG's Home DM meds resumed per surgical service -Stable  Hypokalemia Replaced -repeat bmet in AM  DVT prophylaxis: SCD's Code Status: Full Family Communication: Pt in room, family not at bedside Disposition Plan: Per primary service  Consultants:     Procedures:     Antimicrobials: Anti-infectives    Start     Dose/Rate Route Frequency Ordered Stop   12/31/16 1700  ceFAZolin (ANCEF) IVPB 1 g/50 mL premix     1 g 100 mL/hr over 30 Minutes Intravenous Every 8 hours 12/31/16 0921 01/01/17 0704   12/31/16 0930  ceFAZolin (ANCEF) IVPB 1 g/50 mL premix     1 g 100 mL/hr over 30 Minutes Intravenous NOW 12/31/16 0921 12/31/16 1000   12/31/16 0925  ceFAZolin (ANCEF) 1 GM/50ML IVPB    Comments:  Moring, Taylor   : cabinet override      12/31/16 0925 12/31/16 0930      Subjective: No complaints  Objective: Vitals:   01/01/17 0532 01/01/17 1534 01/01/17 2120 01/02/17 0515  BP: 129/82 116/79 (!) 142/81 (!) 143/89  Pulse: 74 85 75 73  Resp: 18 18 17 18   Temp: 99 F (37.2 C) 98.8 F (37.1 C) 99.6 F (37.6 C) 98.8 F (37.1 C)  TempSrc: Oral Oral Oral Oral  SpO2: 100% 99% 100% 100%  Weight:      Height:        Intake/Output Summary (Last 24 hours) at 01/02/17 1818 Last data filed at 01/02/17  1141  Gross per 24 hour  Intake              360 ml  Output                0 ml  Net              360 ml   Filed Weights   12/31/16 0124  Weight: 83.9 kg (185 lb)    Examination:  General exam: ambulating, conversant Respiratory system: Clear to auscultation. Respiratory effort normal. Cardiovascular system: S1 & S2 heard, RRR. No JVD, murmurs, rubs, gallops or clicks. No pedal edema. Gastrointestinal system: Abdomen is nondistended, soft and nontender. No organomegaly or masses  felt. Normal bowel sounds heard. Central nervous system: Alert and oriented. No focal neurological deficits. Extremities: Symmetric 5 x 5 power. Skin: No rashes, lesions Psychiatry: Judgement and insight appear normal. Mood & affect appropriate.   Data Reviewed: I have personally reviewed following labs and imaging studies  CBC:  Recent Labs Lab 12/31/16 0155  WBC 7.7  NEUTROABS 4.7  HGB 13.4  HCT 37.2*  MCV 93.0  PLT 740   Basic Metabolic Panel:  Recent Labs Lab 12/31/16 0155 01/01/17 0450 01/02/17 0619  NA 126* 130* 131*  K 3.0* 3.9 3.9  CL 88* 96* 93*  CO2 24 28 27   GLUCOSE 267* 181* 213*  BUN 21* 9 10  CREATININE 1.29* 1.10 0.93  CALCIUM 9.9 9.4 9.4   GFR: Estimated Creatinine Clearance: 105.5 mL/min (by C-G formula based on SCr of 0.93 mg/dL). Liver Function Tests: No results for input(s): AST, ALT, ALKPHOS, BILITOT, PROT, ALBUMIN in the last 168 hours. No results for input(s): LIPASE, AMYLASE in the last 168 hours. No results for input(s): AMMONIA in the last 168 hours. Coagulation Profile: No results for input(s): INR, PROTIME in the last 168 hours. Cardiac Enzymes: No results for input(s): CKTOTAL, CKMB, CKMBINDEX, TROPONINI in the last 168 hours. BNP (last 3 results) No results for input(s): PROBNP in the last 8760 hours. HbA1C: No results for input(s): HGBA1C in the last 72 hours. CBG:  Recent Labs Lab 01/01/17 0744 01/01/17 1156 01/01/17 1644 01/01/17 2113 01/02/17 0750  GLUCAP 222* 215* 218* 220* 224*   Lipid Profile: No results for input(s): CHOL, HDL, LDLCALC, TRIG, CHOLHDL, LDLDIRECT in the last 72 hours. Thyroid Function Tests: No results for input(s): TSH, T4TOTAL, FREET4, T3FREE, THYROIDAB in the last 72 hours. Anemia Panel: No results for input(s): VITAMINB12, FOLATE, FERRITIN, TIBC, IRON, RETICCTPCT in the last 72 hours. Sepsis Labs: No results for input(s): PROCALCITON, LATICACIDVEN in the last 168 hours.  No results found for  this or any previous visit (from the past 240 hour(s)).   Radiology Studies: No results found.  Scheduled Meds:  Continuous Infusions:    LOS: 0 days   Pauleen Goleman, Orpah Melter, MD [facility] Pager [phone]  If 7PM-7AM, please contact night-coverage [email or web address] Password [email or web address] 01/02/2017, 6:18 PM

## 2017-01-02 NOTE — Discharge Summary (Signed)
Physician Discharge Summary  Patient ID: [name] MRN: [id number] DOB/AGE: [birth year] 50 y.o.  Admit date: 12/31/2016 Discharge date:   Admission Diagnoses: right both bone forearm fracture Past Medical History:  Diagnosis Date  . Acid reflux   . Concussion   . Coronary artery disease   . Diabetes ([place])   . ETOH abuse   . High cholesterol   . Hypertension   . MI (myocardial infarction)    2012  . Sleep apnea    USES CPAP AS NEEDED  . Tobacco abuse     Discharge Diagnoses:  Active Problems:   Hypertension   GERD (gastroesophageal reflux disease)   ETOH abuse   Diabetes mellitus with complication (HCC)   Forearm fracture, right, closed, initial encounter   Hypokalemia   Closed displaced comminuted fracture of shaft of right radius   Fall at home, initial encounter   Surgeries: Procedure(s): OPEN REDUCTION INTERNAL FIXATION (ORIF) both bone forearm fracture on 12/31/2016    Consultants: Treatment Team:  Minerva Ends, MD  Discharged Condition: Improved  Hospital Course: [name] is an 51 y.o. male who was admitted 12/31/2016 with a chief complaint of Chief Complaint  Patient presents with  . Arm Injury  , and found to have a diagnosis of right both bone forearm fracture.  They were brought to the operating room on 12/31/2016 and underwent Procedure(s): OPEN REDUCTION INTERNAL FIXATION (ORIF) both bone forearm fracture.    They were given perioperative antibiotics: Anti-infectives    Start     Dose/Rate Route Frequency Ordered Stop   12/31/16 1700  ceFAZolin (ANCEF) IVPB 1 g/50 mL premix     1 g 100 mL/hr over 30 Minutes Intravenous Every 8 hours 12/31/16 0921 01/01/17 0704   12/31/16 0930  ceFAZolin (ANCEF) IVPB 1 g/50 mL premix     1 g 100 mL/hr over 30 Minutes Intravenous NOW 12/31/16 0921 12/31/16 1000   12/31/16 0925  ceFAZolin (ANCEF) 1 GM/50ML IVPB    Comments:  Moring, Taylor   : cabinet override      12/31/16 0925 12/31/16 0930    .  They were  given sequential compression devices and early ambulation.  Recent vital signs: Patient Vitals for the past 24 hrs:  BP Temp Temp src Pulse Resp SpO2  01/02/17 0515 (!) 143/89 98.8 F (37.1 C) Oral 73 18 100 %  01/01/17 2120 (!) 142/81 99.6 F (37.6 C) Oral 75 17 100 %  01/01/17 1534 116/79 98.8 F (37.1 C) Oral 85 18 99 %  .  Recent laboratory studies: No results found.  Discharge Medications:   Allergies as of 01/02/2017   No Known Allergies     Medication List    STOP taking these medications   meclizine 12.5 MG tablet Commonly known as:  ANTIVERT   meloxicam 15 MG tablet Commonly known as:  MOBIC     TAKE these medications   amLODipine 5 MG tablet Commonly known as:  NORVASC Take 5 mg by mouth daily.   aspirin 325 MG tablet Take 325 mg by mouth daily.   carvedilol 25 MG tablet Commonly known as:  COREG Take 25 mg by mouth 2 (two) times daily with a meal.   chlorthalidone 50 MG tablet Commonly known as:  HYGROTON Take 50 mg by mouth daily.   glipiZIDE 5 MG tablet Commonly known as:  GLUCOTROL Take 5 mg by mouth daily. 30 minutes prior to a meal   hydrochlorothiazide 25 MG tablet Commonly known as:  HYDRODIURIL Take 1 tablet (25 mg total) by mouth daily.   HYDROcodone-acetaminophen 5-325 MG tablet Commonly known as:  NORCO/VICODIN Take 1-2 tablets by mouth every 4 (four) hours as needed for moderate pain.   nicotine 21 mg/24hr patch Commonly known as:  NICODERM CQ - dosed in mg/24 hours Place 1 patch (21 mg total) onto the skin daily.   omeprazole 20 MG capsule Commonly known as:  PRILOSEC Take 1 capsule (20 mg total) by mouth daily.   ONE-A-DAY MENS PO Take 1 tablet by mouth daily.   oxyCODONE 5 MG immediate release tablet Commonly known as:  Oxy IR/ROXICODONE Take 2 tablets (10 mg total) by mouth every 4 (four) hours as needed for moderate pain.   simvastatin 80 MG tablet Commonly known as:  ZOCOR Take 80 mg by mouth at bedtime.        Diagnostic Studies: Dg Forearm Right  Result Date: 12/31/2016 CLINICAL DATA:  Pain and deformity of the right forearm after fall down steps today. EXAM: RIGHT FOREARM - 2 VIEW COMPARISON:  None. FINDINGS: Transverse fractures of the mid shafts right radius and ulna with mild comminution. Right radial fracture demonstrates full shaft width radial side displacement of the distal fracture fragment with about 1 cm overriding and ulnar angulation. The ulnar fracture demonstrates full shaft width radial side displacement of the distal fracture fragment with about 2 cm overriding and ulnar angulation. There is associated soft tissue swelling. The wrist and elbow appear intact without obvious dislocation. IMPRESSION: Transverse mildly comminuted fractures of the midshaft right radius and right ulna with radial displacement and overriding and ulnar angulation of the distal fracture fragments. Electronically Signed   By: Lucienne Capers M.D.   On: 12/31/2016 02:47    They benefited maximally from their hospital stay and there were no complications.     Disposition: 01-Home or Self Care Discharge Instructions    Call MD / Call 911    Complete by:  As directed    If you experience chest pain or shortness of breath, CALL 911 and be transported to the hospital emergency room.  If you develope a fever above 101 F, pus (white drainage) or increased drainage or redness at the wound, or calf pain, call your surgeon's office.   Constipation Prevention    Complete by:  As directed    Drink plenty of fluids.  Prune juice may be helpful.  You may use a stool softener, such as Colace (over the counter) 100 mg twice a day.  Use MiraLax (over the counter) for constipation as needed.   Diet - low sodium heart healthy    Complete by:  As directed    Increase activity slowly as tolerated    Complete by:  As directed      Follow-up Information    Paulene Floor, MD Follow up in 12 day(s).   Specialty:   Orthopedic Surgery Why:  call to see Dr Amedeo Plenty in 12 days Contact information: [address] [place] [address] [phone]            Signed: Paulene Floor 01/02/2017, 6:00 AM

## 2017-04-13 ENCOUNTER — Emergency Department (HOSPITAL_COMMUNITY)
Admission: EM | Admit: 2017-04-13 | Discharge: 2017-04-13 | Disposition: A | Discharge status: Home / Self Care | Payer: Non-veteran care | Attending: Emergency Medicine | Admitting: Emergency Medicine

## 2017-04-13 ENCOUNTER — Encounter (HOSPITAL_COMMUNITY): Payer: Self-pay

## 2017-04-13 DIAGNOSIS — R112 Nausea with vomiting, unspecified: Secondary | ICD-10-CM | POA: Insufficient documentation

## 2017-04-13 DIAGNOSIS — Z5321 Procedure and treatment not carried out due to patient leaving prior to being seen by health care provider: Secondary | ICD-10-CM | POA: Insufficient documentation

## 2017-04-13 DIAGNOSIS — R1013 Epigastric pain: Secondary | ICD-10-CM | POA: Insufficient documentation

## 2017-04-13 LAB — COMPREHENSIVE METABOLIC PANEL
ALBUMIN: 4.6 g/dL (ref 3.5–5.0)
ALT: 57 U/L (ref 17–63)
AST: 46 U/L — ABNORMAL HIGH (ref 15–41)
Alkaline Phosphatase: 74 U/L (ref 38–126)
Anion gap: 14 (ref 5–15)
BILIRUBIN TOTAL: 1 mg/dL (ref 0.3–1.2)
BUN: 8 mg/dL (ref 6–20)
CO2: 25 mmol/L (ref 22–32)
Calcium: 10 mg/dL (ref 8.9–10.3)
Chloride: 90 mmol/L — ABNORMAL LOW (ref 101–111)
Creatinine, Ser: 0.92 mg/dL (ref 0.61–1.24)
GFR calc Af Amer: >60 mL/min (ref >60)
GFR calc non Af Amer: >60 mL/min (ref >60)
GLUCOSE: 177 mg/dL — AB (ref 65–99)
POTASSIUM: 2.8 mmol/L — AB (ref 3.5–5.1)
SODIUM: 129 mmol/L — AB (ref 135–145)
TOTAL PROTEIN: 8.7 g/dL — AB (ref 6.5–8.1)

## 2017-04-13 LAB — CBC
HEMATOCRIT: 42.3 % (ref 39.0–52.0)
HEMOGLOBIN: 15 g/dL (ref 13.0–17.0)
MCH: 32 pg (ref 26.0–34.0)
MCHC: 35.5 g/dL (ref 30.0–36.0)
MCV: 90.2 fL (ref 78.0–100.0)
Platelets: 215 10*3/uL (ref 150–400)
RBC: 4.69 MIL/uL (ref 4.22–5.81)
RDW: 14.2 % (ref 11.5–15.5)
WBC: 8.8 10*3/uL (ref 4.0–10.5)

## 2017-04-13 LAB — I-STAT TROPONIN, ED: Troponin i, poc: 0.01 ng/mL (ref 0.00–0.08)

## 2017-04-13 LAB — LIPASE, BLOOD: Lipase: 171 U/L — ABNORMAL HIGH (ref 11–51)

## 2017-04-13 NOTE — ED Notes (Signed)
Patient called for room x 3, no answer.  

## 2017-04-13 NOTE — ED Triage Notes (Signed)
Pt states he began having back pain 2 days ago that has now moved into his epigastric area. Pt reports pain radiates through to his back. Pt also reports n/v and has been unable to keep anything on his stomach. Pt also reports HTN after being taken off of his lisinopril.

## 2019-07-30 ENCOUNTER — Other Ambulatory Visit: Payer: Self-pay

## 2019-07-30 ENCOUNTER — Emergency Department (HOSPITAL_COMMUNITY)
Admission: EM | Admit: 2019-07-30 | Discharge: 2019-07-30 | Disposition: A | Discharge status: Home / Self Care | Payer: No Typology Code available for payment source | Attending: Emergency Medicine | Admitting: Emergency Medicine

## 2019-07-30 ENCOUNTER — Emergency Department (HOSPITAL_COMMUNITY): Payer: No Typology Code available for payment source

## 2019-07-30 DIAGNOSIS — Z79899 Other long term (current) drug therapy: Secondary | ICD-10-CM | POA: Insufficient documentation

## 2019-07-30 DIAGNOSIS — Z7984 Long term (current) use of oral hypoglycemic drugs: Secondary | ICD-10-CM | POA: Diagnosis not present

## 2019-07-30 DIAGNOSIS — K3184 Gastroparesis: Secondary | ICD-10-CM | POA: Diagnosis not present

## 2019-07-30 DIAGNOSIS — I10 Essential (primary) hypertension: Secondary | ICD-10-CM | POA: Diagnosis not present

## 2019-07-30 DIAGNOSIS — R29818 Other symptoms and signs involving the nervous system: Secondary | ICD-10-CM | POA: Diagnosis present

## 2019-07-30 DIAGNOSIS — Z7982 Long term (current) use of aspirin: Secondary | ICD-10-CM | POA: Insufficient documentation

## 2019-07-30 DIAGNOSIS — F17210 Nicotine dependence, cigarettes, uncomplicated: Secondary | ICD-10-CM | POA: Insufficient documentation

## 2019-07-30 DIAGNOSIS — M5126 Other intervertebral disc displacement, lumbar region: Secondary | ICD-10-CM | POA: Diagnosis not present

## 2019-07-30 DIAGNOSIS — I2510 Atherosclerotic heart disease of native coronary artery without angina pectoris: Secondary | ICD-10-CM | POA: Diagnosis not present

## 2019-07-30 DIAGNOSIS — E119 Type 2 diabetes mellitus without complications: Secondary | ICD-10-CM | POA: Diagnosis not present

## 2019-07-30 LAB — URINALYSIS, ROUTINE W REFLEX MICROSCOPIC
Bacteria, UA: NONE SEEN
Bilirubin Urine: NEGATIVE
Glucose, UA: >=500 mg/dL — AB
Hgb urine dipstick: NEGATIVE
Ketones, ur: 5 mg/dL — AB
Leukocytes,Ua: NEGATIVE
Nitrite: NEGATIVE
Protein, ur: 30 mg/dL — AB
Specific Gravity, Urine: 1.015 (ref 1.005–1.030)
pH: 5 (ref 5.0–8.0)

## 2019-07-30 LAB — CBC
HCT: 39.9 % (ref 39.0–52.0)
Hemoglobin: 13.9 g/dL (ref 13.0–17.0)
MCH: 32.9 pg (ref 26.0–34.0)
MCHC: 34.8 g/dL (ref 30.0–36.0)
MCV: 94.5 fL (ref 80.0–100.0)
Platelets: 351 10*3/uL (ref 150–400)
RBC: 4.22 MIL/uL (ref 4.22–5.81)
RDW: 13.8 % (ref 11.5–15.5)
WBC: 6 10*3/uL (ref 4.0–10.5)
nRBC: 0 % (ref 0.0–0.2)

## 2019-07-30 LAB — COMPREHENSIVE METABOLIC PANEL
ALT: 36 U/L (ref 0–44)
AST: 57 U/L — ABNORMAL HIGH (ref 15–41)
Albumin: 3.8 g/dL (ref 3.5–5.0)
Alkaline Phosphatase: 67 U/L (ref 38–126)
Anion gap: 16 — ABNORMAL HIGH (ref 5–15)
BUN: <5 mg/dL — ABNORMAL LOW (ref 6–20)
CO2: 25 mmol/L (ref 22–32)
Calcium: 9.5 mg/dL (ref 8.9–10.3)
Chloride: 94 mmol/L — ABNORMAL LOW (ref 98–111)
Creatinine, Ser: 0.95 mg/dL (ref 0.61–1.24)
GFR calc Af Amer: >60 mL/min (ref >60)
GFR calc non Af Amer: >60 mL/min (ref >60)
Glucose, Bld: 279 mg/dL — ABNORMAL HIGH (ref 70–99)
Potassium: 3.3 mmol/L — ABNORMAL LOW (ref 3.5–5.1)
Sodium: 135 mmol/L (ref 135–145)
Total Bilirubin: 0.5 mg/dL (ref 0.3–1.2)
Total Protein: 8.1 g/dL (ref 6.5–8.1)

## 2019-07-30 LAB — PHOSPHORUS: Phosphorus: 3.1 mg/dL (ref 2.5–4.6)

## 2019-07-30 LAB — TSH: TSH: 1.331 u[IU]/mL (ref 0.350–4.500)

## 2019-07-30 LAB — LIPASE, BLOOD: Lipase: 38 U/L (ref 11–51)

## 2019-07-30 LAB — MAGNESIUM: Magnesium: 1.7 mg/dL (ref 1.7–2.4)

## 2019-07-30 LAB — CBG MONITORING, ED: Glucose-Capillary: 253 mg/dL — ABNORMAL HIGH (ref 70–99)

## 2019-07-30 MED ORDER — LORAZEPAM 2 MG/ML IJ SOLN
1.0000 mg | Freq: Once | INTRAMUSCULAR | Status: AC
  Administered 2019-07-30: 1 mg via INTRAVENOUS
  Filled 2019-07-30: qty 1

## 2019-07-30 MED ORDER — LORAZEPAM 2 MG/ML IJ SOLN
1.0000 mg | INTRAMUSCULAR | Status: DC | PRN
  Filled 2019-07-30: qty 1

## 2019-07-30 MED ORDER — ONDANSETRON HCL 4 MG PO TABS: 4.0000 mg | ORAL_TABLET | Freq: Three times a day (TID) | ORAL | 0 refills | Status: DC | PRN

## 2019-07-30 MED ORDER — ONDANSETRON HCL 4 MG/2ML IJ SOLN
4.0000 mg | Freq: Once | INTRAMUSCULAR | Status: AC
  Administered 2019-07-30: 4 mg via INTRAVENOUS
  Filled 2019-07-30: qty 2

## 2019-07-30 MED ORDER — SODIUM CHLORIDE 0.9% FLUSH: 3.0000 mL | Freq: Once | INTRAVENOUS | Status: DC

## 2019-07-30 NOTE — ED Provider Notes (Signed)
MSE was initiated and I personally evaluated the patient and placed orders (if any) at  6:33 AM on July 30, 2019.  Prolonged episodes of vomiting and now with neuro deficits over last couple weeks. Will order labs, head CT.  The patient appears stable so that the remainder of the MSE may be completed by another provider.    Shulem Mader, Corene Cornea, MD 07/30/19 [phone]

## 2019-07-30 NOTE — ED Notes (Signed)
Patient transported to MRI 

## 2019-07-30 NOTE — ED Notes (Signed)
Patient to MRI.

## 2019-07-30 NOTE — Discharge Instructions (Addendum)
You have been evaluated for your symptoms. Your persistent nausea may be due to gastroparesis, a complication from your diabetes.  Follow up with GI specialist for further care. Your numbness and weakness of your right leg may be due to herniated disc in your lumbar spine causing discomfort.  Follow up with your doctor for further care.  Take zofran as needed for nausea.  Return if you have any concerns.

## 2019-07-30 NOTE — ED Triage Notes (Signed)
Per pt he has been having abdominal pain with nausea And vomiting for about 2 weeks now. Pt says he is not really able to hold any food down. Pt has seen his dr at the [place] and was told if persisted he was to come to the  er. Pt said no diarrhea, no appetite

## 2019-07-30 NOTE — ED Provider Notes (Signed)
[facility] EMERGENCY DEPARTMENT Provider Note   CSN: [id number] Arrival date & time: 07/30/19  [id number]     History   Chief Complaint Chief Complaint  Patient presents with   Emesis   Abdominal Pain    HPI [name] is a 53 y.o. male.     The history is provided by the patient. No language interpreter was used.  Emesis Associated symptoms: abdominal pain   Abdominal Pain Associated symptoms: vomiting      53 year old male with history of alcohol abuse, diabetes, alcohol-related pancreatitis, CAD, GERD presenting for evaluation abdominal pain and right leg numbness and weakness.  Patient report for the past 2 weeks he has had recurrent nausea and vomiting each time he eats or drink anything.  States that he would vomit up his content.  Endorse abdominal pain only when he vomits.  Described as a abdominal discomfort to his epigastric right upper quadrant region.  Denies any associated constipation or diarrhea.  Furthermore he also has noticed weakness and numbness to his right lower extremity.  States he has to drag his leg when he walks.  This is new.  Does not complain of any associated fever chills, URI symptoms, back pain, bowel bladder incontinence or saddle anesthesia.  Denies any prior history of IV drug use.  Denies any leg pain or back pain.  He does admits to drinking alcohol on a fairly regular basis last treatment was last night.  He denies any recent sick contact.  States his pain is minimal at this time.  Past Medical History:  Diagnosis Date   Acid reflux    Concussion    Coronary artery disease    Diabetes ([place])    ETOH abuse    High cholesterol    Hypertension    MI (myocardial infarction) ([place])    2012   Sleep apnea    USES CPAP AS NEEDED   Tobacco abuse     Patient Active Problem List   Diagnosis Date Noted   Forearm fracture, right, closed, initial encounter 12/31/2016   Hypokalemia 12/31/2016   Closed displaced  comminuted fracture of shaft of right radius    Fall at home, initial encounter    Chest pain 08/22/2015   Hyponatremia 08/22/2015   GERD (gastroesophageal reflux disease) 08/22/2015   Tobacco abuse 08/22/2015   ETOH abuse 08/22/2015   Chest pain at rest 08/22/2015   Diabetes mellitus with complication ([place])    Pancreatitis 01/05/2013   CAD (coronary artery disease) 01/05/2013   Hypertension 01/05/2013   Hyperglycemia 01/05/2013   Elevated LFTs 01/05/2013    Past Surgical History:  Procedure Laterality Date   KNEE ARTHROSCOPY     ORIF ULNAR FRACTURE Right 12/31/2016   Procedure: OPEN REDUCTION INTERNAL FIXATION (ORIF) both bone forearm fracture;  Surgeon: Roseanne Kaufman, MD;  Location: [facility];  Service: Orthopedics;  Laterality: Right;   stents          Home Medications    Prior to Admission medications   Medication Sig Start Date End Date Taking? Authorizing Provider  amLODipine (NORVASC) 5 MG tablet Take 5 mg by mouth daily.    [provider]  aspirin 325 MG tablet Take 325 mg by mouth daily.    [provider]  carvedilol (COREG) 25 MG tablet Take 25 mg by mouth 2 (two) times daily with a meal.    [provider]  chlorthalidone (HYGROTON) 50 MG tablet Take 50 mg by mouth daily.  [provider]  glipiZIDE (GLUCOTROL) 5 MG tablet Take 5 mg by mouth daily. 30 minutes prior to a meal     [provider]  hydrochlorothiazide (HYDRODIURIL) 25 MG tablet Take 1 tablet (25 mg total) by mouth daily. Patient not taking: Reported on 12/31/2016 05/31/16   Jeannett Senior, PA-C  HYDROcodone-acetaminophen (NORCO/VICODIN) 5-325 MG tablet Take 1-2 tablets by mouth every 4 (four) hours as needed for moderate pain. Patient not taking: Reported on 12/31/2016 08/23/15   Caren Griffins, MD  Multiple Vitamin (ONE-A-DAY MENS PO) Take 1 tablet by mouth daily.    [provider]  nicotine (NICODERM CQ - DOSED IN MG/24  HOURS) 21 mg/24hr patch Place 1 patch (21 mg total) onto the skin daily. Patient not taking: Reported on 12/31/2016 08/23/15   Caren Griffins, MD  omeprazole (PRILOSEC) 20 MG capsule Take 1 capsule (20 mg total) by mouth daily. Patient not taking: Reported on 12/31/2016 08/23/15   Caren Griffins, MD  oxyCODONE (OXY IR/ROXICODONE) 5 MG immediate release tablet Take 2 tablets (10 mg total) by mouth every 4 (four) hours as needed for moderate pain. 01/01/17   Roseanne Kaufman, MD  simvastatin (ZOCOR) 80 MG tablet Take 80 mg by mouth at bedtime.    [provider]    Family History Family History  Problem Relation Age of Onset   Diabetes Mother     Social History Social History   Tobacco Use   Smoking status: Current Every Day Smoker    Packs/day: 0.50    Years: 30.00    Pack years: 15.00    Types: Cigarettes   Smokeless tobacco: Never Used  Substance Use Topics   Alcohol use: Yes    Alcohol/week: 6.0 standard drinks    Types: 6 Cans of beer per week    Comment: occassionally on weekends   Drug use: No     Allergies   Patient has no known allergies.   Review of Systems Review of Systems  Gastrointestinal: Positive for abdominal pain and vomiting.  All other systems reviewed and are negative.    Physical Exam Updated Vital Signs BP (!) 154/106 (BP Location: Right Arm)    Pulse 91    Temp 97.8 F (36.6 C) (Oral)    Resp 16    SpO2 98%   Physical Exam Vitals signs and nursing note reviewed.  Constitutional:      General: He is not in acute distress.    Appearance: He is well-developed.  HENT:     Head: Atraumatic.  Eyes:     Conjunctiva/sclera: Conjunctivae normal.  Neck:     Musculoskeletal: Neck supple.  Cardiovascular:     Rate and Rhythm: Normal rate and regular rhythm.  Pulmonary:     Effort: Pulmonary effort is normal.     Breath sounds: Normal breath sounds.  Abdominal:     General: Abdomen is flat.     Palpations: Abdomen is soft.      Tenderness: There is abdominal tenderness in the right upper quadrant.  Musculoskeletal:     Comments: No significant midline spine tenderness crepitus or step-off  Skin:    General: Skin is warm.     Capillary Refill: Capillary refill takes less than 2 seconds.     Findings: No rash.  Neurological:     Mental Status: He is alert.     Comments: Right lower extremity: 4 out of 5 strength to right lower extremity compared to 5 out of 5  strength to left lower extremity.  Evidence of foot drop, and decreased dorsiflexion and plantar flexion.  Intact patellar deep tendon reflex.  Sensation is diminished compared to left lower extremity.  Leg compartment is soft.      ED Treatments / Results  Labs (all labs ordered are listed, but only abnormal results are displayed) Labs Reviewed  COMPREHENSIVE METABOLIC PANEL - Abnormal; Notable for the following components:      Result Value   Potassium 3.3 (*)    Chloride 94 (*)    Glucose, Bld 279 (*)    BUN <5 (*)    AST 57 (*)    Anion gap 16 (*)    All other components within normal limits  URINALYSIS, ROUTINE W REFLEX MICROSCOPIC - Abnormal; Notable for the following components:   Glucose, UA >=500 (*)    Ketones, ur 5 (*)    Protein, ur 30 (*)    All other components within normal limits  CBG MONITORING, ED - Abnormal; Notable for the following components:   Glucose-Capillary 253 (*)    All other components within normal limits  LIPASE, BLOOD  CBC  MAGNESIUM  PHOSPHORUS  TSH    EKG None  Radiology Mr Brain Wo Contrast  Result Date: 07/30/2019 CLINICAL DATA:  Right leg paresthesia and weakness. EXAM: MRI HEAD WITHOUT CONTRAST TECHNIQUE: Multiplanar, multiecho pulse sequences of the brain and surrounding structures were obtained without intravenous contrast. COMPARISON:  Head CT 04/27/2015 FINDINGS: Brain: No acute infarct, mass, midline shift, or extra-axial fluid collection is identified. A few chronic microhemorrhages are noted  medially in the parietal lobes, nonspecific. Periventricular and subcortical white matter T2 hyperintensities are nonspecific but compatible with mild chronic small vessel ischemic disease. There is mild cerebral atrophy. Vascular: Major intracranial vascular flow voids are preserved. Skull and upper cervical spine: Unremarkable bone marrow signal. Sinuses/Orbits: Suspected old right orbital floor fracture. Paranasal sinuses and mastoid air cells are clear. Other: None. IMPRESSION: 1. No acute intracranial abnormality. 2. Mild chronic small vessel ischemic disease. Electronically Signed   By: Logan Bores M.D.   On: 07/30/2019 13:10   Mr Lumbar Spine Wo Contrast  Result Date: 07/30/2019 CLINICAL DATA:  Right leg paresthesia and weakness. EXAM: MRI LUMBAR SPINE WITHOUT CONTRAST TECHNIQUE: Multiplanar, multisequence MR imaging of the lumbar spine was performed. No intravenous contrast was administered. COMPARISON:  CT abdomen and pelvis 01/05/2013 FINDINGS: Segmentation:  Standard. Alignment:  Normal. Vertebrae: No fracture, suspicious osseous lesion, or significant marrow edema. Hemangiomas in the T11 and L3 vertebral bodies. Conus medullaris and cauda equina: Conus extends to the lower L1 level. Conus and cauda equina appear normal. Paraspinal and other soft tissues: Unremarkable. Disc levels: L1-2 through L3-4: Negative. L4-5: Disc desiccation and mild disc space narrowing. Mild disc bulging, a small right foraminal and extraforaminal disc protrusion, and mild facet hypertrophy result in mild right neural foraminal stenosis. The disc protrusion may affect the right L4 nerve near the lateral aspect of the neural foramen. The disc protrusion was also present in 2014. L5-S1: Mild disc desiccation. Mild disc bulging, mild endplate spurring, and mild facet hypertrophy result in mild right and borderline left neural foraminal stenosis without spinal stenosis. IMPRESSION: 1. Small right foraminal and extraforaminal  disc protrusion at L4-5 with mild foraminal stenosis and potential impingement of the extraforaminal right L4 nerve. 2. Mild right neural foraminal stenosis at L5-S1. Electronically Signed   By: Logan Bores M.D.   On: 07/30/2019 08:29   US Abdomen Limited  Result Date: 07/30/2019 CLINICAL DATA:  RIGHT upper quadrant pain for several weeks EXAM: ULTRASOUND ABDOMEN LIMITED RIGHT UPPER QUADRANT COMPARISON:  None. FINDINGS: Gallbladder: No gallbladder distension. No gallbladder wall thickening or pericholecystic fluid. Negative sonographic Murphy's sign. They are multiple wall adherent echogenic structures which do not have posterior shadowing. These suggest small polyps. All the polyps less than 5 mm. Polyps not significantly changed from comparison exams. Common bile duct: Diameter: Normal at 4 mm Liver: Uniform increased hepatic echogenicity. No duct dilatation portal vein is patent on color Doppler imaging with normal direction of blood flow towards the liver. Other: None. IMPRESSION: 1. Echogenic liver with differential including hepatic steatosis or hepatocellular disease such as cirrhosis. 2. No evidence of cholecystitis. Several wall adherent gallbladder polyps are again demonstrated. Electronically Signed   By: Suzy Bouchard M.D.   On: 07/30/2019 09:01    Procedures Procedures (including critical care time)  Medications Ordered in ED Medications  sodium chloride flush (NS) 0.9 % injection 3 mL (3 mLs Intravenous Not Given 07/30/19 [id number])  LORazepam (ATIVAN) injection 1 mg (has no administration in time range)  LORazepam (ATIVAN) injection 1 mg (1 mg Intravenous Given 07/30/19 0728)     Initial Impression / Assessment and Plan / ED Course  I have reviewed the triage vital signs and the nursing notes.  Pertinent labs & imaging results that were available during my care of the patient were reviewed by me and considered in my medical decision making (see chart for details).        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  Furthermore he also complaining of right leg weakness and numbness.  Patient with multiple comorbidities.  Will obtain limited abdominal exam to rule out gallbladder etiology which may account for his upper abdominal discomfort nausea and vomiting.  For his right leg weakness and numbness, I appreciate consultation from neurologist, Dr. Malen Gauze who recommend lumbar spine MRI without contrast for further evaluation.  9:49 AM Limited abdominal ultrasound without signs of cholecystitis.  Suspect nausea and vomiting may be gastroparesis secondary to his diabetes.  Lumbar spine MRI showing a right neuroforaminal stenosis at L5-S1 as well as potential impingement of the extraforaminal right L4 nerve.  Although this finding may contribute to patient's presenting complaint, I did discuss this with Dr. Vallery Ridge who evaluated patient and felt it would be reasonable to obtain a brain MRI to rule out MS as a potential cause.  Patient did report when he was in his 37s he was told that he had some kind of stroke when they "see spots" in his brain.  1:22 PM Additional labs including magnesium, phosphorus, and TSH are all within normal limit.  Brain MRI obtained showing no acute intracranial abnormalities.  At this time patient is in no acute discomfort, eating and tolerates p.o.  I encourage patient to follow-up with his primary care  doctor for further evaluation of his condition.  I mentioned about his lumbar spine MRI and felt that he is not the best  candidate for steroid due to increase hyperglycemia.  Since he does not have any pain, no additional pain medication prescribed.  Will discharge home with antinausea medication and encourage patient to follow-up for further care.  Return precaution discussed.   Domenic Moras, PA-C 07/30/19 1332    Charlesetta Shanks, MD 07/31/19 1110

## 2019-07-30 NOTE — ED Provider Notes (Signed)
Medical screening examination/treatment/procedure(s) were conducted as a shared visit with non-physician practitioner(s) and myself.  I personally evaluated the patient during the encounter.     Patient has been having longstanding and intermittent problems with vomiting episodes that often are associated with eating.  He reports he also now has right sided numbness of his foot and lower leg.  He also notes some numbness of his hand and arm.  Patient reports that this is a impacted his gait.  He reports now he has a kind of drag that leg forward and then use it as a brace.  He is not using a cane or a walker.  This is been going on for 2 weeks.  He reports previously it had a problem with left lower extremity weakness but that improved after his heart attack in 2012.  Patient is alert with clear mental status.  Heart is regular without murmur gallop.  Lungs are clear without gross wheeze rhonchi or rale.  Abdomen is soft and nontender.  Patient has decreased sharp dull sensory perception on the right lower extremity and right upper extremity from the knee down and from the elbow down respectively.  Patient seems to have significant muscular atrophy globally.  Patellar reflexes are absent.  Skin is warm and dry.  Cognitive function and speech are normal.  Patient gives a history of waxing and waning neurologic symptoms.  We will proceed with MRI.  Also he gives a long history of GI symptoms which sound very suspicious for gastroparesis.  He does not have associated pain.  I agree with plan of management.    Charlesetta Shanks, MD 07/31/19 1109

## 2019-07-30 NOTE — ED Triage Notes (Signed)
PT reported a hx of claustrophobia this reported to PA and requested meds for MRI

## 2019-10-10 ENCOUNTER — Emergency Department (HOSPITAL_COMMUNITY)
Admission: EM | Admit: 2019-10-10 | Discharge: 2019-10-10 | Disposition: A | Discharge status: Home / Self Care | Payer: No Typology Code available for payment source | Attending: Emergency Medicine | Admitting: Emergency Medicine

## 2019-10-10 ENCOUNTER — Other Ambulatory Visit: Payer: Self-pay

## 2019-10-10 ENCOUNTER — Encounter (HOSPITAL_COMMUNITY): Payer: Self-pay | Admitting: Emergency Medicine

## 2019-10-10 ENCOUNTER — Emergency Department (HOSPITAL_COMMUNITY): Payer: No Typology Code available for payment source

## 2019-10-10 DIAGNOSIS — Z20828 Contact with and (suspected) exposure to other viral communicable diseases: Secondary | ICD-10-CM | POA: Diagnosis not present

## 2019-10-10 DIAGNOSIS — R0789 Other chest pain: Secondary | ICD-10-CM | POA: Insufficient documentation

## 2019-10-10 DIAGNOSIS — R1013 Epigastric pain: Secondary | ICD-10-CM | POA: Insufficient documentation

## 2019-10-10 DIAGNOSIS — I252 Old myocardial infarction: Secondary | ICD-10-CM | POA: Insufficient documentation

## 2019-10-10 DIAGNOSIS — Z7984 Long term (current) use of oral hypoglycemic drugs: Secondary | ICD-10-CM | POA: Diagnosis not present

## 2019-10-10 DIAGNOSIS — R079 Chest pain, unspecified: Secondary | ICD-10-CM | POA: Diagnosis present

## 2019-10-10 DIAGNOSIS — I10 Essential (primary) hypertension: Secondary | ICD-10-CM | POA: Insufficient documentation

## 2019-10-10 DIAGNOSIS — R112 Nausea with vomiting, unspecified: Secondary | ICD-10-CM | POA: Diagnosis not present

## 2019-10-10 DIAGNOSIS — F17210 Nicotine dependence, cigarettes, uncomplicated: Secondary | ICD-10-CM | POA: Diagnosis not present

## 2019-10-10 DIAGNOSIS — Z79899 Other long term (current) drug therapy: Secondary | ICD-10-CM | POA: Diagnosis not present

## 2019-10-10 DIAGNOSIS — E119 Type 2 diabetes mellitus without complications: Secondary | ICD-10-CM | POA: Insufficient documentation

## 2019-10-10 LAB — BASIC METABOLIC PANEL
Anion gap: 14 (ref 5–15)
BUN: <5 mg/dL — ABNORMAL LOW (ref 6–20)
CO2: 26 mmol/L (ref 22–32)
Calcium: 9.6 mg/dL (ref 8.9–10.3)
Chloride: 94 mmol/L — ABNORMAL LOW (ref 98–111)
Creatinine, Ser: 0.76 mg/dL (ref 0.61–1.24)
GFR calc Af Amer: >60 mL/min (ref >60)
GFR calc non Af Amer: >60 mL/min (ref >60)
Glucose, Bld: 141 mg/dL — ABNORMAL HIGH (ref 70–99)
Potassium: 3 mmol/L — ABNORMAL LOW (ref 3.5–5.1)
Sodium: 134 mmol/L — ABNORMAL LOW (ref 135–145)

## 2019-10-10 LAB — CBC
HCT: 39.8 % (ref 39.0–52.0)
Hemoglobin: 13.7 g/dL (ref 13.0–17.0)
MCH: 33.5 pg (ref 26.0–34.0)
MCHC: 34.4 g/dL (ref 30.0–36.0)
MCV: 97.3 fL (ref 80.0–100.0)
Platelets: 189 10*3/uL (ref 150–400)
RBC: 4.09 MIL/uL — ABNORMAL LOW (ref 4.22–5.81)
RDW: 13.4 % (ref 11.5–15.5)
WBC: 5.7 10*3/uL (ref 4.0–10.5)
nRBC: 0 % (ref 0.0–0.2)

## 2019-10-10 LAB — TROPONIN I (HIGH SENSITIVITY)
Troponin I (High Sensitivity): 14 ng/L (ref <18)
Troponin I (High Sensitivity): 12 ng/L (ref <18)

## 2019-10-10 LAB — SARS CORONAVIRUS 2 (TAT 6-24 HRS): SARS Coronavirus 2: NEGATIVE

## 2019-10-10 MED ORDER — SODIUM CHLORIDE 0.9% FLUSH
3.0000 mL | Freq: Once | INTRAVENOUS | Status: AC
  Administered 2019-10-10: 15:00:00 3 mL via INTRAVENOUS

## 2019-10-10 MED ORDER — KETOROLAC TROMETHAMINE 30 MG/ML IJ SOLN
15.0000 mg | Freq: Once | INTRAMUSCULAR | Status: AC
  Administered 2019-10-10: 15 mg via INTRAVENOUS
  Filled 2019-10-10: qty 1

## 2019-10-10 MED ORDER — SODIUM CHLORIDE 0.9 % IV BOLUS: 1000.0000 mL | Freq: Once | INTRAVENOUS | Status: DC

## 2019-10-10 NOTE — ED Provider Notes (Signed)
[facility] EMERGENCY DEPARTMENT Provider Note   CSN: [id number] Arrival date & time: 10/10/19  1321     History Chief Complaint  Patient presents with  . Chest Pain  . Emesis  . Cough    [name] is a 53 y.o. male.  HPI     Patient presents with chest pain and self-described flulike illness. He was in his usual state of health until yesterday.  Yesterday began having generalized discomfort, nausea, had several episodes of vomiting.  He subsequently developed pain in the sternum, epigastrium. At that time he has had some degree of pain, though it is improving, without any clear intervention. No diarrhea, no other abdominal pain, no dyspnea. Patient has a notable history of myocardial infarction, 8 years ago.  He takes Plavix. He smokes and drinks. Notably, the patient is also scheduled for endoscopy in 5 days, and recently stopped his meloxicam.   Past Medical History:  Diagnosis Date  . Acid reflux   . Concussion   . Coronary artery disease   . Diabetes ([place])   . ETOH abuse   . High cholesterol   . Hypertension   . MI (myocardial infarction) ([place])    2012  . Sleep apnea    USES CPAP AS NEEDED  . Tobacco abuse     Patient Active Problem List   Diagnosis Date Noted  . Forearm fracture, right, closed, initial encounter 12/31/2016  . Hypokalemia 12/31/2016  . Closed displaced comminuted fracture of shaft of right radius   . Fall at home, initial encounter   . Chest pain 08/22/2015  . Hyponatremia 08/22/2015  . GERD (gastroesophageal reflux disease) 08/22/2015  . Tobacco abuse 08/22/2015  . ETOH abuse 08/22/2015  . Chest pain at rest 08/22/2015  . Diabetes mellitus with complication ([place])   . Pancreatitis 01/05/2013  . CAD (coronary artery disease) 01/05/2013  . Hypertension 01/05/2013  . Hyperglycemia 01/05/2013  . Elevated LFTs 01/05/2013    Past Surgical History:  Procedure Laterality Date  . KNEE ARTHROSCOPY    . ORIF ULNAR  FRACTURE Right 12/31/2016   Procedure: OPEN REDUCTION INTERNAL FIXATION (ORIF) both bone forearm fracture;  Surgeon: Roseanne Kaufman, MD;  Location: [facility];  Service: Orthopedics;  Laterality: Right;  . stents         Family History  Problem Relation Age of Onset  . Diabetes Mother     Social History   Tobacco Use  . Smoking status: Current Every Day Smoker    Packs/day: 0.50    Years: 30.00    Pack years: 15.00    Types: Cigarettes  . Smokeless tobacco: Never Used  Substance Use Topics  . Alcohol use: Yes    Alcohol/week: 6.0 standard drinks    Types: 6 Cans of beer per week    Comment: occassionally on weekends  . Drug use: No    Home Medications Prior to Admission medications   Medication Sig Start Date End Date Taking? Authorizing Provider  amLODipine (NORVASC) 5 MG tablet Take 5 mg by mouth daily.    [provider]  aspirin 325 MG tablet Take 325 mg by mouth daily.    [provider]  carvedilol (COREG) 25 MG tablet Take 25 mg by mouth 2 (two) times daily with a meal.    [provider]  chlorthalidone (HYGROTON) 50 MG tablet Take 50 mg by mouth daily.     [provider]  glipiZIDE (GLUCOTROL) 5 MG tablet Take 5 mg by  mouth daily. 30 minutes prior to a meal     [provider]  hydrochlorothiazide (HYDRODIURIL) 25 MG tablet Take 1 tablet (25 mg total) by mouth daily. Patient not taking: Reported on 12/31/2016 05/31/16   Jeannett Senior, PA-C  HYDROcodone-acetaminophen (NORCO/VICODIN) 5-325 MG tablet Take 1-2 tablets by mouth every 4 (four) hours as needed for moderate pain. Patient not taking: Reported on 12/31/2016 08/23/15   Caren Griffins, MD  Multiple Vitamin (ONE-A-DAY MENS PO) Take 1 tablet by mouth daily.    [provider]  nicotine (NICODERM CQ - DOSED IN MG/24 HOURS) 21 mg/24hr patch Place 1 patch (21 mg total) onto the skin daily. Patient not taking: Reported on 12/31/2016 08/23/15   Caren Griffins, MD   omeprazole (PRILOSEC) 20 MG capsule Take 1 capsule (20 mg total) by mouth daily. Patient not taking: Reported on 12/31/2016 08/23/15   Caren Griffins, MD  ondansetron (ZOFRAN) 4 MG tablet Take 1 tablet (4 mg total) by mouth every 8 (eight) hours as needed for nausea or vomiting. 07/30/19   Domenic Moras, PA-C  oxyCODONE (OXY IR/ROXICODONE) 5 MG immediate release tablet Take 2 tablets (10 mg total) by mouth every 4 (four) hours as needed for moderate pain. 01/01/17   Roseanne Kaufman, MD  simvastatin (ZOCOR) 80 MG tablet Take 80 mg by mouth at bedtime.    [provider]    Allergies    Patient has no known allergies.  Review of Systems   Review of Systems  Constitutional:       Per HPI, otherwise negative  HENT:       Per HPI, otherwise negative  Respiratory:       Per HPI, otherwise negative  Cardiovascular:       Per HPI, otherwise negative  Gastrointestinal: Positive for abdominal pain, nausea and vomiting. Negative for diarrhea.  Endocrine:       Negative aside from HPI  Genitourinary:       Neg aside from HPI   Musculoskeletal:       Per HPI, otherwise negative  Skin: Negative.   Neurological: Negative for syncope.    Physical Exam Updated Vital Signs BP (!) 146/101   Pulse 81   Temp 98.4 F (36.9 C) (Oral)   Resp 18   Ht 5' 10.5" (1.791 m)   Wt 77.1 kg   SpO2 97%   BMI 24.05 kg/m   Physical Exam Vitals and nursing note reviewed.  Constitutional:      General: He is not in acute distress.    Appearance: He is well-developed.  HENT:     Head: Normocephalic and atraumatic.  Eyes:     Conjunctiva/sclera: Conjunctivae normal.  Cardiovascular:     Rate and Rhythm: Normal rate and regular rhythm.  Pulmonary:     Effort: Pulmonary effort is normal. No respiratory distress.     Breath sounds: No stridor.  Abdominal:     General: There is no distension.     Tenderness: There is abdominal tenderness in the epigastric area.  Skin:    General: Skin is  warm and dry.  Neurological:     Mental Status: He is alert and oriented to person, place, and time.     ED Results / Procedures / Treatments   Labs (all labs ordered are listed, but only abnormal results are displayed) Labs Reviewed  SARS CORONAVIRUS 2 (TAT 6-24 HRS)  BASIC METABOLIC PANEL  CBC  TROPONIN I (HIGH SENSITIVITY)    EKG  None  Radiology DG Chest Portable 1 View  Result Date: 10/10/2019 CLINICAL DATA:  Chest pain and cough with shortness of breath EXAM: PORTABLE CHEST 1 VIEW COMPARISON:  August 22, 2015. FINDINGS: No edema or consolidation. Heart size and pulmonary vascularity are normal. No adenopathy. There old healed rib fractures on the left. No pneumothorax. IMPRESSION: No edema or consolidation.  Cardiac silhouette within normal limits. Electronically Signed   By: Lowella Grip III M.D.   On: 10/10/2019 14:01    Procedures Procedures (including critical care time)  Medications Ordered in ED Medications  sodium chloride flush (NS) 0.9 % injection 3 mL (has no administration in time range)    ED Course  I have reviewed the triage vital signs and the nursing notes.  Pertinent labs & imaging results that were available during my care of the patient were reviewed by me and considered in my medical decision making (see chart for details).    MDM Rules/Calculators/A&P                     3:45 PM Initial labs reassuring.  This adult male with history of prior MI presents with chest pain, nausea, vomiting, generalized illness. Patient's concerns are most consistent with viral etiology, possibly Covid. However, given his chest pain, his history, additional ACS evaluation performed. Initial studies reassuring, with a nonischemic EKG, normal first troponin. Patient's other labs also generally noncontributory. Patient is receiving fluids, anti-inflammatories, will require additional monitoring, labs.  Dr. Sedonia Small is aware.  Final Clinical Impression(s) / ED  Diagnoses Final diagnoses:  Atypical chest pain     Carmin Muskrat, MD 10/10/19 1547

## 2019-10-10 NOTE — Discharge Instructions (Addendum)
You were evaluated in the Emergency Department and after careful evaluation, we did not find any emergent condition requiring admission or further testing in the hospital.  Your exam/testing today was overall reassuring.  Your labs do not show any evidence of heart damage.  Please follow-up with your GI doctors as scheduled.  Please return to the Emergency Department if you experience any worsening of your condition.  We encourage you to follow up with a primary care provider.  Thank you for allowing [place] to be a part of your care.

## 2019-10-10 NOTE — ED Notes (Signed)
Pt given dc instructions pt verbalizes understanding.  

## 2019-10-10 NOTE — ED Provider Notes (Signed)
  Provider Note MRN:  [id number]  Arrival date & time: 10/10/19    ED Course and Medical Decision Making  Assumed care from Dr. Vanita Panda at shift change.  Chest pain favored to be GI related, awaiting second troponin, if flat or downtrending appropriate for discharge.  5 PM update: Second troponin is negative, appropriate for discharge.  Strict return precautions.  Procedures  Final Clinical Impressions(s) / ED Diagnoses     ICD-10-CM   1. Atypical chest pain  R07.89     ED Discharge Orders    None        Discharge Instructions     You were evaluated in the Emergency Department and after careful evaluation, we did not find any emergent condition requiring admission or further testing in the hospital.  Your exam/testing today was overall reassuring.  Your labs do not show any evidence of heart damage.  Please follow-up with your GI doctors as scheduled.  Please return to the Emergency Department if you experience any worsening of your condition.  We encourage you to follow up with a primary care provider.  Thank you for allowing [place] to be a part of your care.      Barth Kirks. [place]onia Small, [place] [email or web address]    Maudie Flakes, MD 10/10/19 [phone]

## 2019-10-10 NOTE — ED Triage Notes (Signed)
Pt presents with c/o left sided chest pain with no radiation, cough, emesis, and shortness of breath. He is unsure of any exposures but thinks he has had a fever. Hx of MI

## 2019-12-29 ENCOUNTER — Encounter (HOSPITAL_COMMUNITY): Payer: Self-pay

## 2019-12-29 ENCOUNTER — Emergency Department (HOSPITAL_COMMUNITY): Payer: No Typology Code available for payment source

## 2019-12-29 ENCOUNTER — Other Ambulatory Visit: Payer: Self-pay

## 2019-12-29 ENCOUNTER — Emergency Department (HOSPITAL_COMMUNITY)
Admission: EM | Admit: 2019-12-29 | Discharge: 2019-12-29 | Disposition: A | Discharge status: Home / Self Care | Payer: No Typology Code available for payment source | Attending: Emergency Medicine | Admitting: Emergency Medicine

## 2019-12-29 DIAGNOSIS — I2510 Atherosclerotic heart disease of native coronary artery without angina pectoris: Secondary | ICD-10-CM | POA: Insufficient documentation

## 2019-12-29 DIAGNOSIS — S2242XA Multiple fractures of ribs, left side, initial encounter for closed fracture: Secondary | ICD-10-CM | POA: Diagnosis not present

## 2019-12-29 DIAGNOSIS — Y939 Activity, unspecified: Secondary | ICD-10-CM | POA: Insufficient documentation

## 2019-12-29 DIAGNOSIS — Z79899 Other long term (current) drug therapy: Secondary | ICD-10-CM | POA: Insufficient documentation

## 2019-12-29 DIAGNOSIS — F17210 Nicotine dependence, cigarettes, uncomplicated: Secondary | ICD-10-CM | POA: Diagnosis not present

## 2019-12-29 DIAGNOSIS — I252 Old myocardial infarction: Secondary | ICD-10-CM | POA: Diagnosis not present

## 2019-12-29 DIAGNOSIS — Z7984 Long term (current) use of oral hypoglycemic drugs: Secondary | ICD-10-CM | POA: Insufficient documentation

## 2019-12-29 DIAGNOSIS — W19XXXA Unspecified fall, initial encounter: Secondary | ICD-10-CM

## 2019-12-29 DIAGNOSIS — W0110XA Fall on same level from slipping, tripping and stumbling with subsequent striking against unspecified object, initial encounter: Secondary | ICD-10-CM | POA: Diagnosis not present

## 2019-12-29 DIAGNOSIS — Y929 Unspecified place or not applicable: Secondary | ICD-10-CM | POA: Insufficient documentation

## 2019-12-29 DIAGNOSIS — Y999 Unspecified external cause status: Secondary | ICD-10-CM | POA: Insufficient documentation

## 2019-12-29 DIAGNOSIS — E119 Type 2 diabetes mellitus without complications: Secondary | ICD-10-CM | POA: Diagnosis not present

## 2019-12-29 DIAGNOSIS — I10 Essential (primary) hypertension: Secondary | ICD-10-CM | POA: Diagnosis not present

## 2019-12-29 DIAGNOSIS — Z7982 Long term (current) use of aspirin: Secondary | ICD-10-CM | POA: Insufficient documentation

## 2019-12-29 DIAGNOSIS — S299XXA Unspecified injury of thorax, initial encounter: Secondary | ICD-10-CM | POA: Diagnosis present

## 2019-12-29 NOTE — ED Provider Notes (Signed)
[facility] DEPT Provider Note   CSN: [id number] Arrival date & time: 12/29/19  1157     History Chief Complaint  Patient presents with  . Fall  . rib cage pain  . Leg Pain    [name] is a 54 y.o. male.  HPI He presents for evaluation of injury to left chest, yesterday when he stood up to walk.  He feels like his legs were not coordinated, which caused him to fall.  He states he has chronic numbness in the right leg, for many months, and believes it is due to "herniated disks."  He is working with the [place] for possible "surgery or acupuncture."  No other injuries from the fall.  He has a history of abnormal lumbar MRI, 07/30/2019:  IMPRESSION: 1. Small right foraminal and extraforaminal disc protrusion at L4-5 with mild foraminal stenosis and potential impingement of the extraforaminal right L4 nerve. 2. Mild right neural foraminal stenosis at L5-S1.    Past Medical History:  Diagnosis Date  . Acid reflux   . Concussion   . Coronary artery disease   . Diabetes ([place])   . ETOH abuse   . High cholesterol   . Hypertension   . MI (myocardial infarction) ([place])    2012  . Sleep apnea    USES CPAP AS NEEDED  . Tobacco abuse     Patient Active Problem List   Diagnosis Date Noted  . Forearm fracture, right, closed, initial encounter 12/31/2016  . Hypokalemia 12/31/2016  . Closed displaced comminuted fracture of shaft of right radius   . Fall at home, initial encounter   . Chest pain 08/22/2015  . Hyponatremia 08/22/2015  . GERD (gastroesophageal reflux disease) 08/22/2015  . Tobacco abuse 08/22/2015  . ETOH abuse 08/22/2015  . Chest pain at rest 08/22/2015  . Diabetes mellitus with complication ([place])   . Pancreatitis 01/05/2013  . CAD (coronary artery disease) 01/05/2013  . Hypertension 01/05/2013  . Hyperglycemia 01/05/2013  . Elevated LFTs 01/05/2013    Past Surgical History:  Procedure Laterality Date  . KNEE ARTHROSCOPY     . ORIF ULNAR FRACTURE Right 12/31/2016   Procedure: OPEN REDUCTION INTERNAL FIXATION (ORIF) both bone forearm fracture;  Surgeon: Roseanne Kaufman, MD;  Location: [facility];  Service: Orthopedics;  Laterality: Right;  . stents         Family History  Problem Relation Age of Onset  . Diabetes Mother     Social History   Tobacco Use  . Smoking status: Current Every Day Smoker    Packs/day: 0.25    Years: 30.00    Pack years: 7.50    Types: Cigarettes  . Smokeless tobacco: Never Used  Substance Use Topics  . Alcohol use: Yes    Alcohol/week: 6.0 standard drinks    Types: 6 Cans of beer per week    Comment: occassionally on weekends  . Drug use: No    Home Medications Prior to Admission medications   Medication Sig Start Date End Date Taking? Authorizing Provider  amLODipine (NORVASC) 5 MG tablet Take 5 mg by mouth daily.    [provider]  aspirin 325 MG tablet Take 325 mg by mouth daily.    [provider]  carvedilol (COREG) 25 MG tablet Take 25 mg by mouth 2 (two) times daily with a meal.    [provider]  chlorthalidone (HYGROTON) 50 MG tablet Take 50 mg by mouth daily.     [provider]  glipiZIDE (GLUCOTROL) 5 MG tablet Take 5 mg by mouth daily. 30 minutes prior to a meal     [provider]  hydrochlorothiazide (HYDRODIURIL) 25 MG tablet Take 1 tablet (25 mg total) by mouth daily. Patient not taking: Reported on 12/31/2016 05/31/16   Jeannett Senior, PA-C  HYDROcodone-acetaminophen (NORCO/VICODIN) 5-325 MG tablet Take 1-2 tablets by mouth every 4 (four) hours as needed for moderate pain. Patient not taking: Reported on 12/31/2016 08/23/15   Caren Griffins, MD  Multiple Vitamin (ONE-A-DAY MENS PO) Take 1 tablet by mouth daily.    [provider]  nicotine (NICODERM CQ - DOSED IN MG/24 HOURS) 21 mg/24hr patch Place 1 patch (21 mg total) onto the skin daily. Patient not taking: Reported on 12/31/2016 08/23/15   Caren Griffins, MD  omeprazole (PRILOSEC) 20 MG capsule Take 1 capsule (20 mg total) by mouth daily. Patient not taking: Reported on 12/31/2016 08/23/15   Caren Griffins, MD  ondansetron (ZOFRAN) 4 MG tablet Take 1 tablet (4 mg total) by mouth every 8 (eight) hours as needed for nausea or vomiting. 07/30/19   Domenic Moras, PA-C  oxyCODONE (OXY IR/ROXICODONE) 5 MG immediate release tablet Take 2 tablets (10 mg total) by mouth every 4 (four) hours as needed for moderate pain. 01/01/17   Roseanne Kaufman, MD  simvastatin (ZOCOR) 80 MG tablet Take 80 mg by mouth at bedtime.    [provider]    Allergies    Patient has no known allergies.  Review of Systems   Review of Systems  All other systems reviewed and are negative.   Physical Exam Updated Vital Signs BP (!) 147/109 (BP Location: Left Arm)   Pulse 98   Temp 98.3 F (36.8 C) (Oral)   Ht 5\' 11"  (1.803 m)   Wt 79.4 kg   SpO2 97%   BMI 24.41 kg/m   Physical Exam Vitals and nursing note reviewed.  Constitutional:      Appearance: He is well-developed.  HENT:     Head: Normocephalic and atraumatic.     Right Ear: External ear normal.     Left Ear: External ear normal.  Eyes:     Conjunctiva/sclera: Conjunctivae normal.     Pupils: Pupils are equal, round, and reactive to light.  Neck:     Trachea: Phonation normal.  Cardiovascular:     Rate and Rhythm: Normal rate and regular rhythm.     Heart sounds: Normal heart sounds.  Pulmonary:     Effort: Pulmonary effort is normal.     Breath sounds: Normal breath sounds.  Chest:     Chest wall: Tenderness (Left lateral, diffuse without crepitation or deformity) present.  Abdominal:     General: There is no distension.     Palpations: Abdomen is soft. There is no mass.     Tenderness: There is no abdominal tenderness. There is no guarding.  Musculoskeletal:        General: Normal range of motion.     Cervical back: Normal range of motion and neck supple.  Skin:     General: Skin is warm and dry.  Neurological:     Mental Status: He is alert and oriented to person, place, and time.     Cranial Nerves: No cranial nerve deficit.     Sensory: No sensory deficit.     Motor: No abnormal muscle tone.     Coordination: Coordination normal.  Psychiatric:  Mood and Affect: Mood normal.        Behavior: Behavior normal.        Thought Content: Thought content normal.        Judgment: Judgment normal.     ED Results / Procedures / Treatments   Labs (all labs ordered are listed, but only abnormal results are displayed) Labs Reviewed - No data to display  EKG None  Radiology DG Ribs Unilateral W/Chest Left  Result Date: 12/29/2019 CLINICAL DATA:  Pain following fall EXAM: LEFT RIBS AND CHEST - 3+ VIEW COMPARISON:  October 10, 2019 chest radiograph. FINDINGS: Frontal chest as well as oblique and cone-down rib images were obtained. Lungs are clear. Heart size and pulmonary vascularity are normal. No adenopathy. There is an acute appearing fracture of the anterior left eighth rib. Alignment in this area is near anatomic. There is also a nondisplaced fracture of the anterior left seventh rib. There are healed fractures of the lateral left fourth and fifth ribs. No evident pneumothorax or pleural effusion. IMPRESSION: Acute appearing fractures of the left anterior seventh and eighth ribs with alignment near anatomic in these areas. Older healed fractures of the lateral left fourth and fifth ribs. No evident pneumothorax or pleural effusion. Lungs clear. Electronically Signed   By: Lowella Grip III M.D.   On: 12/29/2019 13:13    Procedures Procedures (including critical care time)  Medications Ordered in ED Medications - No data to display  ED Course  I have reviewed the triage vital signs and the nursing notes.  Pertinent labs & imaging results that were available during my care of the patient were reviewed by me and considered in my medical  decision making (see chart for details).    MDM Rules/Calculators/A&P                       Patient Vitals for the past 24 hrs:  BP Temp Temp src Pulse SpO2 Height Weight  12/29/19 1223 (!) 147/109 - - - - - -  12/29/19 1220 - - - - - 5\' 11"  (1.803 m) 79.4 kg  12/29/19 1209 (!) 182/115 98.3 F (36.8 C) Oral 98 97 % - -    1:30 PM Reevaluation with update and discussion. After initial assessment and treatment, an updated evaluation reveals he remains comfortable has no additional complaints.  I discussed findings with him.  He is comfortable using aspirin or Tylenol for pain.  All questions answered. Daleen Bo   Medical Decision Making: Fall, related to right leg numbness which is chronic.  Isolated injuries left ribs 7 and 8, fractured not displaced.  No indication for further ED treatment or intervention at this time.  [name] was evaluated in Emergency Department on 12/29/2019 for the symptoms described in the history of present illness. He was evaluated in the context of the global COVID-19 pandemic, which necessitated consideration that the patient might be at risk for infection with the SARS-CoV-2 virus that causes COVID-19. Institutional protocols and algorithms that pertain to the evaluation of patients at risk for COVID-19 are in a state of rapid change based on information released by regulatory bodies including the CDC and federal and state organizations. These policies and algorithms were followed during the patient's care in the ED.   CRITICAL CARE- no Performed by: Daleen Bo   Nursing Notes Reviewed/ Care Coordinated Applicable Imaging Reviewed Interpretation of Laboratory Data incorporated into ED treatment  The patient appears reasonably screened and/or stabilized for  discharge and I doubt any other medical condition or other [facility] requiring further screening, evaluation, or treatment in the ED at this time prior to discharge.  Plan: Home Medications-continue  usual medications and use OTC analgesia of choice; Home Treatments-avoid lifting and activity, until healed; return here if the recommended treatment, does not improve the symptoms; Recommended follow up-PCP, as needed    Final Clinical Impression(s) / ED Diagnoses Final diagnoses:  Fall, initial encounter  Closed fracture of multiple ribs of left side, initial encounter    Rx / DC Orders ED Discharge Orders    None       Daleen Bo, MD 12/29/19 1332

## 2019-12-29 NOTE — ED Triage Notes (Signed)
Patient states he has 2 herniated discs and while walking yesterday, his right leg did not follow his left leg causing him to fall. Patient c/o right leg pain and left rib cage pain.

## 2019-12-29 NOTE — Discharge Instructions (Addendum)
Rest, avoid lifting, use ice or heat on sore area as needed for comfort.  Take Tylenol or aspirin for pain.  See your doctor for problems with the rib fracture, or for ongoing care for your right leg numbness.

## 2019-12-29 NOTE — ED Notes (Signed)
An After Visit Summary was printed and given to the patient. Discharge instructions given and no further questions at this time.  

## 2020-04-04 ENCOUNTER — Inpatient Hospital Stay (HOSPITAL_COMMUNITY): Payer: No Typology Code available for payment source

## 2020-04-04 ENCOUNTER — Inpatient Hospital Stay (HOSPITAL_COMMUNITY)
Admit: 2020-04-04 | Discharge: 2020-04-07 | DRG: 439 | Discharge status: Against Medical Advice | Payer: No Typology Code available for payment source | Attending: Family Medicine | Admitting: Family Medicine

## 2020-04-04 ENCOUNTER — Encounter (HOSPITAL_COMMUNITY): Payer: Self-pay

## 2020-04-04 ENCOUNTER — Emergency Department (HOSPITAL_COMMUNITY): Payer: No Typology Code available for payment source

## 2020-04-04 DIAGNOSIS — Z955 Presence of coronary angioplasty implant and graft: Secondary | ICD-10-CM | POA: Diagnosis not present

## 2020-04-04 DIAGNOSIS — Z7982 Long term (current) use of aspirin: Secondary | ICD-10-CM | POA: Diagnosis not present

## 2020-04-04 DIAGNOSIS — K824 Cholesterolosis of gallbladder: Secondary | ICD-10-CM | POA: Diagnosis present

## 2020-04-04 DIAGNOSIS — I252 Old myocardial infarction: Secondary | ICD-10-CM

## 2020-04-04 DIAGNOSIS — E869 Volume depletion, unspecified: Secondary | ICD-10-CM | POA: Diagnosis present

## 2020-04-04 DIAGNOSIS — E119 Type 2 diabetes mellitus without complications: Secondary | ICD-10-CM | POA: Diagnosis present

## 2020-04-04 DIAGNOSIS — K859 Acute pancreatitis without necrosis or infection, unspecified: Secondary | ICD-10-CM | POA: Diagnosis present

## 2020-04-04 DIAGNOSIS — E876 Hypokalemia: Secondary | ICD-10-CM | POA: Diagnosis present

## 2020-04-04 DIAGNOSIS — I2510 Atherosclerotic heart disease of native coronary artery without angina pectoris: Secondary | ICD-10-CM | POA: Diagnosis present

## 2020-04-04 DIAGNOSIS — F1010 Alcohol abuse, uncomplicated: Secondary | ICD-10-CM | POA: Diagnosis present

## 2020-04-04 DIAGNOSIS — E7800 Pure hypercholesterolemia, unspecified: Secondary | ICD-10-CM | POA: Diagnosis present

## 2020-04-04 DIAGNOSIS — R110 Nausea: Secondary | ICD-10-CM | POA: Diagnosis not present

## 2020-04-04 DIAGNOSIS — Z79899 Other long term (current) drug therapy: Secondary | ICD-10-CM | POA: Diagnosis not present

## 2020-04-04 DIAGNOSIS — N179 Acute kidney failure, unspecified: Secondary | ICD-10-CM | POA: Diagnosis present

## 2020-04-04 DIAGNOSIS — D649 Anemia, unspecified: Secondary | ICD-10-CM | POA: Diagnosis present

## 2020-04-04 DIAGNOSIS — R195 Other fecal abnormalities: Secondary | ICD-10-CM | POA: Diagnosis present

## 2020-04-04 DIAGNOSIS — F17210 Nicotine dependence, cigarettes, uncomplicated: Secondary | ICD-10-CM | POA: Diagnosis present

## 2020-04-04 DIAGNOSIS — Z79891 Long term (current) use of opiate analgesic: Secondary | ICD-10-CM

## 2020-04-04 DIAGNOSIS — E871 Hypo-osmolality and hyponatremia: Secondary | ICD-10-CM | POA: Diagnosis present

## 2020-04-04 DIAGNOSIS — Z20822 Contact with and (suspected) exposure to covid-19: Secondary | ICD-10-CM | POA: Diagnosis present

## 2020-04-04 DIAGNOSIS — Z833 Family history of diabetes mellitus: Secondary | ICD-10-CM

## 2020-04-04 DIAGNOSIS — K219 Gastro-esophageal reflux disease without esophagitis: Secondary | ICD-10-CM | POA: Diagnosis present

## 2020-04-04 DIAGNOSIS — I10 Essential (primary) hypertension: Secondary | ICD-10-CM | POA: Diagnosis present

## 2020-04-04 DIAGNOSIS — K852 Alcohol induced acute pancreatitis without necrosis or infection: Principal | ICD-10-CM | POA: Diagnosis present

## 2020-04-04 DIAGNOSIS — Z794 Long term (current) use of insulin: Secondary | ICD-10-CM

## 2020-04-04 LAB — CREATININE, SERUM
Creatinine, Ser: 0.78 mg/dL (ref 0.61–1.24)
GFR calc Af Amer: >60 mL/min (ref >60)
GFR calc non Af Amer: >60 mL/min (ref >60)

## 2020-04-04 LAB — COMPREHENSIVE METABOLIC PANEL
ALT: 23 U/L (ref 0–44)
AST: 21 U/L (ref 15–41)
Albumin: 4.8 g/dL (ref 3.5–5.0)
Alkaline Phosphatase: 67 U/L (ref 38–126)
Anion gap: 19 — ABNORMAL HIGH (ref 5–15)
BUN: 24 mg/dL — ABNORMAL HIGH (ref 6–20)
CO2: 18 mmol/L — ABNORMAL LOW (ref 22–32)
Calcium: 10.3 mg/dL (ref 8.9–10.3)
Chloride: 90 mmol/L — ABNORMAL LOW (ref 98–111)
Creatinine, Ser: 1.31 mg/dL — ABNORMAL HIGH (ref 0.61–1.24)
GFR calc Af Amer: >60 mL/min (ref >60)
GFR calc non Af Amer: >60 mL/min (ref >60)
Glucose, Bld: 286 mg/dL — ABNORMAL HIGH (ref 70–99)
Potassium: 3.8 mmol/L (ref 3.5–5.1)
Sodium: 127 mmol/L — ABNORMAL LOW (ref 135–145)
Total Bilirubin: 1.7 mg/dL — ABNORMAL HIGH (ref 0.3–1.2)
Total Protein: 9.4 g/dL — ABNORMAL HIGH (ref 6.5–8.1)

## 2020-04-04 LAB — LIPID PANEL
Cholesterol: 248 mg/dL — ABNORMAL HIGH (ref 0–200)
HDL: 52 mg/dL (ref >40)
LDL Cholesterol: 159 mg/dL — ABNORMAL HIGH (ref 0–99)
Total CHOL/HDL Ratio: 4.8 RATIO
Triglycerides: 183 mg/dL — ABNORMAL HIGH (ref <150)
VLDL: 37 mg/dL (ref 0–40)

## 2020-04-04 LAB — CBC
HCT: 45.8 % (ref 39.0–52.0)
HCT: 40 % (ref 39.0–52.0)
Hemoglobin: 16 g/dL (ref 13.0–17.0)
Hemoglobin: 13.9 g/dL (ref 13.0–17.0)
MCH: 32.8 pg (ref 26.0–34.0)
MCH: 33 pg (ref 26.0–34.0)
MCHC: 34.9 g/dL (ref 30.0–36.0)
MCHC: 34.8 g/dL (ref 30.0–36.0)
MCV: 93.9 fL (ref 80.0–100.0)
MCV: 95 fL (ref 80.0–100.0)
Platelets: 218 10*3/uL (ref 150–400)
Platelets: 179 10*3/uL (ref 150–400)
RBC: 4.88 MIL/uL (ref 4.22–5.81)
RBC: 4.21 MIL/uL — ABNORMAL LOW (ref 4.22–5.81)
RDW: 13.8 % (ref 11.5–15.5)
RDW: 13.8 % (ref 11.5–15.5)
WBC: 8.7 10*3/uL (ref 4.0–10.5)
WBC: 7.3 10*3/uL (ref 4.0–10.5)
nRBC: 0 % (ref 0.0–0.2)
nRBC: 0 % (ref 0.0–0.2)

## 2020-04-04 LAB — ETHANOL: Alcohol, Ethyl (B): <10 mg/dL

## 2020-04-04 LAB — SARS CORONAVIRUS 2 BY RT PCR (HOSPITAL ORDER, PERFORMED IN ~~LOC~~ HOSPITAL LAB): SARS Coronavirus 2: NEGATIVE

## 2020-04-04 LAB — LIPASE, BLOOD: Lipase: 373 U/L — ABNORMAL HIGH (ref 11–51)

## 2020-04-04 MED ORDER — FENTANYL CITRATE (PF) 100 MCG/2ML IJ SOLN
25.0000 ug | Freq: Once | INTRAMUSCULAR | Status: AC
  Administered 2020-04-04: 25 ug via INTRAVENOUS
  Filled 2020-04-04: qty 2

## 2020-04-04 MED ORDER — NICOTINE 14 MG/24HR TD PT24
14.0000 mg | MEDICATED_PATCH | Freq: Every day | TRANSDERMAL | Status: DC
  Filled 2020-04-04 (×2): qty 1

## 2020-04-04 MED ORDER — SODIUM CHLORIDE 0.9% FLUSH: 3.0000 mL | Freq: Once | INTRAVENOUS | Status: DC

## 2020-04-04 MED ORDER — IOHEXOL 300 MG/ML  SOLN
100.0000 mL | Freq: Once | INTRAMUSCULAR | Status: AC | PRN
  Administered 2020-04-04: 100 mL via INTRAVENOUS

## 2020-04-04 MED ORDER — SODIUM CHLORIDE (PF) 0.9 % IJ SOLN
INTRAMUSCULAR | Status: AC
  Filled 2020-04-04: qty 50

## 2020-04-04 MED ORDER — ONDANSETRON HCL 4 MG/2ML IJ SOLN
4.0000 mg | Freq: Once | INTRAMUSCULAR | Status: AC
  Administered 2020-04-04: 4 mg via INTRAVENOUS
  Filled 2020-04-04: qty 2

## 2020-04-04 MED ORDER — SODIUM CHLORIDE 0.9 % IV SOLN: INTRAVENOUS | Status: DC

## 2020-04-04 MED ORDER — ENOXAPARIN SODIUM 40 MG/0.4ML ~~LOC~~ SOLN
40.0000 mg | SUBCUTANEOUS | Status: DC
  Administered 2020-04-04 – 2020-04-06 (×3): 40 mg via SUBCUTANEOUS
  Filled 2020-04-04 (×4): qty 0.4

## 2020-04-04 MED ORDER — SODIUM CHLORIDE 0.9 % IV BOLUS
1000.0000 mL | Freq: Once | INTRAVENOUS | Status: AC
  Administered 2020-04-04: 1000 mL via INTRAVENOUS

## 2020-04-04 NOTE — ED Provider Notes (Signed)
[facility] DEPT Provider Note   CSN: [id number] Arrival date & time: 04/04/20  1238     History Chief Complaint  Patient presents with  . Nausea  . Abdominal Pain    [name] is a 54 y.o. male.  HPI    Patient presents with concern of persistent nausea, vomiting.  There is also abdominal pain. Symptoms began 4 days ago without clear precipitant.  Nausea, since onset has been worsening, with associated anorexia, multiple episodes of vomiting. The pain was initially in the upper abdomen, and is now diffuse, including the lower abdomen, described sore, severe. He has not been able to tolerate any food, drink, medication for relief, and symptoms have become substantially more profound. No chest pain, no dyspnea. Patient is currently receiving therapy for H. pylori infection.  He sees physicians at the [facility].  He has a follow-up scheduled in 3 weeks. Patient smokes, drinks.   Past Medical History:  Diagnosis Date  . Acid reflux   . Concussion   . Coronary artery disease   . Diabetes ([place])   . ETOH abuse   . High cholesterol   . Hypertension   . MI (myocardial infarction) ([place])    2012  . Sleep apnea    USES CPAP AS NEEDED  . Tobacco abuse     Patient Active Problem List   Diagnosis Date Noted  . Forearm fracture, right, closed, initial encounter 12/31/2016  . Hypokalemia 12/31/2016  . Closed displaced comminuted fracture of shaft of right radius   . Fall at home, initial encounter   . Chest pain 08/22/2015  . Hyponatremia 08/22/2015  . GERD (gastroesophageal reflux disease) 08/22/2015  . Tobacco abuse 08/22/2015  . ETOH abuse 08/22/2015  . Chest pain at rest 08/22/2015  . Diabetes mellitus with complication ([place])   . Pancreatitis 01/05/2013  . CAD (coronary artery disease) 01/05/2013  . Hypertension 01/05/2013  . Hyperglycemia 01/05/2013  . Elevated LFTs 01/05/2013    Past Surgical History:   Procedure Laterality Date  . KNEE ARTHROSCOPY    . ORIF ULNAR FRACTURE Right 12/31/2016   Procedure: OPEN REDUCTION INTERNAL FIXATION (ORIF) both bone forearm fracture;  Surgeon: Roseanne Kaufman, MD;  Location: [facility];  Service: Orthopedics;  Laterality: Right;  . stents         Family History  Problem Relation Age of Onset  . Diabetes Mother     Social History   Tobacco Use  . Smoking status: Current Every Day Smoker    Packs/day: 0.25    Years: 30.00    Pack years: 7.50    Types: Cigarettes  . Smokeless tobacco: Never Used  Vaping Use  . Vaping Use: Never used  Substance Use Topics  . Alcohol use: Yes    Alcohol/week: 6.0 standard drinks    Types: 6 Cans of beer per week    Comment: occassionally on weekends  . Drug use: No    Home Medications Prior to Admission medications   Medication Sig Start Date End Date Taking? Authorizing Provider  amLODipine (NORVASC) 5 MG tablet Take 5 mg by mouth daily.   Yes [provider]  aspirin 325 MG tablet Take 325 mg by mouth daily.   Yes [provider]  bismuth subsalicylate (PEPTO BISMOL) 262 MG chewable tablet Chew 524 mg by mouth in the morning, at noon, in the evening, and at bedtime.   Yes [provider]  carvedilol (COREG) 25 MG tablet Take 25 mg  by mouth 2 (two) times daily with a meal.   Yes [provider]  chlorthalidone (HYGROTON) 50 MG tablet Take 50 mg by mouth daily.    Yes [provider]  cyclobenzaprine (FLEXERIL) 10 MG tablet Take 10 mg by mouth 3 (three) times daily as needed for muscle spasms.   Yes [provider]  glipiZIDE (GLUCOTROL) 5 MG tablet Take 5 mg by mouth daily. 30 minutes prior to a meal    Yes [provider]  insulin glargine (LANTUS) 100 UNIT/ML Solostar Pen Inject 50 Units into the skin at bedtime.   Yes [provider]  lisinopril (ZESTRIL) 40 MG tablet Take 40 mg by mouth daily.   Yes [provider]   metroNIDAZOLE (FLAGYL) 250 MG tablet Take 250 mg by mouth 4 (four) times daily.   Yes [provider]  omeprazole (PRILOSEC) 20 MG capsule Take 20 mg by mouth daily.   Yes [provider]  ondansetron (ZOFRAN) 4 MG tablet Take 1 tablet (4 mg total) by mouth every 8 (eight) hours as needed for nausea or vomiting. 07/30/19  Yes Domenic Moras, PA-C  oxyCODONE (OXY IR/ROXICODONE) 5 MG immediate release tablet Take 2 tablets (10 mg total) by mouth every 4 (four) hours as needed for moderate pain. 01/01/17  Yes Roseanne Kaufman, MD  simvastatin (ZOCOR) 80 MG tablet Take 80 mg by mouth at bedtime.   Yes [provider]  Vitamin D, Ergocalciferol, (DRISDOL) 1.25 MG (50000 UNIT) CAPS capsule Take 50,000 Units by mouth every 7 (seven) days. Fridays   Yes [provider]  hydrochlorothiazide (HYDRODIURIL) 25 MG tablet Take 1 tablet (25 mg total) by mouth daily. Patient not taking: Reported on 12/31/2016 05/31/16   Jeannett Senior, PA-C  HYDROcodone-acetaminophen (NORCO/VICODIN) 5-325 MG tablet Take 1-2 tablets by mouth every 4 (four) hours as needed for moderate pain. Patient not taking: Reported on 12/31/2016 08/23/15   Caren Griffins, MD  nicotine (NICODERM CQ - DOSED IN MG/24 HOURS) 21 mg/24hr patch Place 1 patch (21 mg total) onto the skin daily. Patient not taking: Reported on 12/31/2016 08/23/15   Caren Griffins, MD  omeprazole (PRILOSEC) 20 MG capsule Take 1 capsule (20 mg total) by mouth daily. Patient not taking: Reported on 12/31/2016 08/23/15   Caren Griffins, MD    Allergies    Patient has no known allergies.  Review of Systems   Review of Systems  Constitutional:       Per HPI, otherwise negative  HENT:       Per HPI, otherwise negative  Respiratory:       Per HPI, otherwise negative  Cardiovascular:       Per HPI, otherwise negative  Gastrointestinal: Positive for abdominal pain, nausea and vomiting.  Endocrine:       Negative aside from HPI   Genitourinary:       Neg aside from HPI   Musculoskeletal:       Per HPI, otherwise negative  Skin: Negative.   Neurological: Negative for syncope.    Physical Exam Updated Vital Signs BP (!) 153/111   Pulse 100   Temp 97.7 F (36.5 C) (Oral)   Resp 18   SpO2 99%   Physical Exam Vitals and nursing note reviewed.  Constitutional:      General: He is not in acute distress.    Appearance: He is well-developed.  HENT:     Head: Normocephalic and atraumatic.  Eyes:     Conjunctiva/sclera: Conjunctivae  normal.  Cardiovascular:     Rate and Rhythm: Regular rhythm. Tachycardia present.  Pulmonary:     Effort: Pulmonary effort is normal. No respiratory distress.     Breath sounds: No stridor.  Abdominal:     General: There is no distension.     Tenderness: There is generalized abdominal tenderness. There is guarding.  Skin:    General: Skin is warm and dry.  Neurological:     Mental Status: He is alert and oriented to person, place, and time.     ED Results / Procedures / Treatments   Labs (all labs ordered are listed, but only abnormal results are displayed) Labs Reviewed  LIPASE, BLOOD - Abnormal; Notable for the following components:      Result Value   Lipase 373 (*)    All other components within normal limits  COMPREHENSIVE METABOLIC PANEL - Abnormal; Notable for the following components:   Sodium 127 (*)    Chloride 90 (*)    CO2 18 (*)    Glucose, Bld 286 (*)    BUN 24 (*)    Creatinine, Ser 1.31 (*)    Total Protein 9.4 (*)    Total Bilirubin 1.7 (*)    Anion gap 19 (*)    All other components within normal limits  SARS CORONAVIRUS 2 BY RT PCR (HOSPITAL ORDER, [place] [facility])  CBC  ETHANOL     Radiology CT Abdomen Pelvis W Contrast  Result Date: 04/04/2020 CLINICAL DATA:  Nausea and vomiting, lower abdominal pain EXAM: CT ABDOMEN AND PELVIS WITH CONTRAST TECHNIQUE: Multidetector CT imaging of the abdomen and pelvis was  performed using the standard protocol following bolus administration of intravenous contrast. CONTRAST:  150mL OMNIPAQUE IOHEXOL 300 MG/ML  SOLN COMPARISON:  01/05/2013 FINDINGS: Lower chest: No acute pleural or parenchymal lung disease. Hepatobiliary: Diffuse hepatic steatosis. No focal liver abnormalities. The gallbladder is unremarkable. Pancreas: Inflammatory changes are seen surrounding the pancreas, consistent with acute pancreatitis. Parenchyma enhances normally. No fluid collection, pseudocyst, or abscess. Spleen: Normal in size without focal abnormality. Adrenals/Urinary Tract: Kidneys enhance normally. Small cortical cysts are unchanged. Adrenals are unremarkable. Bladder is grossly normal. Stomach/Bowel: No bowel obstruction or ileus. Normal appendix right lower quadrant. No bowel wall thickening or inflammatory change. Vascular/Lymphatic: Aortic atherosclerosis. Vascular structures are patent. No enlarged abdominal or pelvic lymph nodes. Reproductive: Prostate is unremarkable. Other: Small amount of free fluid within the lower abdomen and pelvis. No free intra-abdominal gas. There is a small umbilical hernia containing a portion of small bowel. No obstruction, strangulation, or incarceration. Musculoskeletal: No acute or destructive bony lesions. Reconstructed images demonstrate no additional findings. IMPRESSION: 1. Acute uncomplicated pancreatitis, with diffuse inflammatory changes surrounding the entirety of the pancreatic parenchyma. No pseudocyst, fluid collection, or abscess. 2. Hepatic steatosis. 3. Small umbilical hernia containing a portion of small bowel. 4. Trace ascites. 5.  Aortic Atherosclerosis (ICD10-I70.0). Electronically Signed   By: Randa Ngo M.D.   On: 04/04/2020 16:50    Procedures Procedures (including critical care time)  Medications Ordered in ED Medications  sodium chloride flush (NS) 0.9 % injection 3 mL (0 mLs Intravenous Hold 04/04/20 1608)  sodium chloride (PF)  0.9 % injection (has no administration in time range)  sodium chloride 0.9 % bolus 1,000 mL (has no administration in time range)  sodium chloride 0.9 % bolus 1,000 mL (0 mLs Intravenous Stopped 04/04/20 1608)  fentaNYL (SUBLIMAZE) injection 25 mcg (25 mcg Intravenous Given 04/04/20 1439)  ondansetron (  ZOFRAN) injection 4 mg (4 mg Intravenous Given 04/04/20 1433)  iohexol (OMNIPAQUE) 300 MG/ML solution 100 mL (100 mLs Intravenous Contrast Given 04/04/20 1633)    ED Course  I have reviewed the triage vital signs and the nursing notes.  Pertinent labs & imaging results that were available during my care of the patient were reviewed by me and considered in my medical decision making (see chart for details).   With initial consideration of obstruction versus infection versus H. pylori worsening versus gastritis versus perforation, patient had labs, fluids, analgesics, antiemetics, CT scan ordered.  Update: Initial labs notable for hyponatremia, worsening renal function, and anion gap acidosis. MDM Rules/Calculators/A&P                          5:16 PM On repeat exam patient is improved.  He is aware of all findings, and with persistent pain will require additional analgesia. Findings most notable for demonstration of acute kidney injury, acute pancreatitis.  The latter finding consistent with both lab abnormalities and CT abnormalities. I discussed patient's case with our internal medicine colleagues for admission after additional therapy was provided.  Final Clinical Impression(s) / ED Diagnoses Final diagnoses:  Alcohol-induced acute pancreatitis without infection or necrosis  AKI (acute kidney injury) ([place])   MDM Number of Diagnoses or Management Options AKI (acute kidney injury) ([place]): new, needed workup Alcohol-induced acute pancreatitis without infection or necrosis: new, needed workup   Amount and/or Complexity of Data Reviewed Clinical lab tests: reviewed Tests in the radiology  section of CPT: reviewed Tests in the medicine section of CPT: reviewed Decide to obtain previous medical records or to obtain history from someone other than the patient: yes Review and summarize past medical records: yes Discuss the patient with other providers: yes Independent visualization of images, tracings, or specimens: yes  Risk of Complications, Morbidity, and/or Mortality Presenting problems: high Diagnostic procedures: high Management options: high  Critical Care Total time providing critical care: < 30 minutes  Patient Progress Patient progress: improved    Carmin Muskrat, MD 04/04/20 1717

## 2020-04-04 NOTE — H&P (Signed)
HPI  [name] TWS:[id number] DOB: 03/04/1966 DOA: 04/04/2020  PCP: Charolette Forward, MD   Chief Complaint: Abdominal pain  HPI:  54 year old black male known veteran gets care at the [place] Recent back surgery about 2 months ago for what sounds like spondylolisthesis, MI 2012 status post DES x2 followed by Dr. Terrence Dupont locally DM TY 2 Occasional drinker Smoker Comes to the hospital [facility] long ED 04/04/2020 Abdominal pain since 6/13 no aggravating relieving factors-has not eaten a full diet since that time + Nausea, + emesis "12 times today"-sometimes bilious no blood No hematemesis No melena Not dizzy no other issues  Tells me recently Rx H pylori based on CT scan??-Was being treated with bismuth as well as Flagyl-(quite unusual usually 3 or 4 drug therapy) Was supposed to see the [place] on the sixth of next month?    Review of Systems:   Pertinent +'s: As above Pertinent -"s: Chest pain, fever, chills, blurred vision, double vision, unilateral weakness, rash, new meds other than the above  ED Course: Given a bolus of fluids, labs obtained, CT obtained, pain medication administered fentanyl, antinausea meds administered   Past Medical History:  Diagnosis Date  . Acid reflux   . Concussion   . Coronary artery disease   . Diabetes ([place])   . ETOH abuse   . High cholesterol   . Hypertension   . MI (myocardial infarction) ([place])    2012  . Sleep apnea    USES CPAP AS NEEDED  . Tobacco abuse    Past Surgical History:  Procedure Laterality Date  . KNEE ARTHROSCOPY    . ORIF ULNAR FRACTURE Right 12/31/2016   Procedure: OPEN REDUCTION INTERNAL FIXATION (ORIF) both bone forearm fracture;  Surgeon: Roseanne Kaufman, MD;  Location: [place];  Service: Orthopedics;  Laterality: Right;  . stents      reports that he has been smoking cigarettes. He has a 7.50 pack-year smoking history. He has never used smokeless tobacco. He reports current alcohol use of about 6.0 standard drinks  of alcohol per week. He reports that he does not use drugs.  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 Diabetes Mother    Prior to Admission medications   Medication Sig Start Date End Date Taking? Authorizing Provider  amLODipine (NORVASC) 5 MG tablet Take 5 mg by mouth daily.   Yes [provider]  aspirin 325 MG tablet Take 325 mg by mouth daily.   Yes [provider]  bismuth subsalicylate (PEPTO BISMOL) 262 MG chewable tablet Chew 524 mg by mouth in the morning, at noon, in the evening, and at bedtime.   Yes [provider]  carvedilol (COREG) 25 MG tablet Take 25 mg by mouth 2 (two) times daily with a meal.   Yes [provider]  chlorthalidone (HYGROTON) 50 MG tablet Take 50 mg by mouth daily.    Yes [provider]  cyclobenzaprine (FLEXERIL) 10 MG tablet Take 10 mg by mouth 3 (three) times daily as needed for muscle spasms.   Yes [provider]  glipiZIDE (GLUCOTROL) 5 MG tablet Take 5 mg by mouth daily. 30 minutes prior to a meal    Yes [provider]  insulin glargine (LANTUS) 100  UNIT/ML Solostar Pen Inject 50 Units into the skin at bedtime.   Yes [provider]  lisinopril (ZESTRIL) 40 MG tablet Take 40 mg by mouth daily.   Yes [provider]  metroNIDAZOLE (FLAGYL) 250 MG tablet Take 250 mg by mouth 4 (four) times daily.   Yes [provider]  omeprazole (PRILOSEC) 20 MG capsule Take 20 mg by mouth daily.   Yes [provider]  ondansetron (ZOFRAN) 4 MG tablet Take 1 tablet (4 mg total) by mouth every 8 (eight) hours as needed for nausea or vomiting. 07/30/19  Yes Domenic Moras, PA-C  oxyCODONE (OXY IR/ROXICODONE) 5 MG immediate release tablet  Take 2 tablets (10 mg total) by mouth every 4 (four) hours as needed for moderate pain. 01/01/17  Yes Roseanne Kaufman, MD  simvastatin (ZOCOR) 80 MG tablet Take 80 mg by mouth at bedtime.   Yes [provider]  Vitamin D, Ergocalciferol, (DRISDOL) 1.25 MG (50000 UNIT) CAPS capsule Take 50,000 Units by mouth every 7 (seven) days. Fridays   Yes [provider]  hydrochlorothiazide (HYDRODIURIL) 25 MG tablet Take 1 tablet (25 mg total) by mouth daily. Patient not taking: Reported on 12/31/2016 05/31/16   Jeannett Senior, PA-C  HYDROcodone-acetaminophen (NORCO/VICODIN) 5-325 MG tablet Take 1-2 tablets by mouth every 4 (four) hours as needed for moderate pain. Patient not taking: Reported on 12/31/2016 08/23/15   Caren Griffins, MD  nicotine (NICODERM CQ - DOSED IN MG/24 HOURS) 21 mg/24hr patch Place 1 patch (21 mg total) onto the skin daily. Patient not taking: Reported on 12/31/2016 08/23/15   Caren Griffins, MD  omeprazole (PRILOSEC) 20 MG capsule Take 1 capsule (20 mg total) by mouth daily. Patient not taking: Reported on 12/31/2016 08/23/15   Caren Griffins, MD    Physical Exam:  Vitals:   04/04/20 1450 04/04/20 1710  BP: (!) 154/112 (!) 153/111  Pulse: (!) 104 100  Resp: 18 18  Temp:    SpO2: 99% 99%     EOMI NCAT Pleasant looking younger than stated age no icterus no pallor Mallampati 1  Chest clear no added sound no rales no rhonchi  S1-S2 no murmur tachycardic however low 100s  Abdomen soft mild tenderness in lower quadrant no epigastric tenderness cannot appreciate liver or spleen  No lower extremity edema  No rash, no edema as above  Euthymic congruent and pleasant in terms of psych  I have personally reviewed following labs and imaging studies  Labs:   Lipase 373  Sodium 127, CO2 18, chloride 90  BUN/creatinine 24/1.3  WBC 8.7  Ethanol level is less than 10   Imaging studies:  1. Acute uncomplicated pancreatitis, with diffuse  inflammatory changes surrounding the entirety of the pancreatic parenchyma. No pseudocyst, fluid collection, or abscess. 2. Hepatic steatosis. 3. Small umbilical hernia containing a portion of small bowel. 4. Trace ascites.  5.  Aortic Atherosclerosis (ICD10-I70.0).   Medical tests:   EKG independently reviewed: Sinus rhythm/sinus tach with PVC  Test discussed with performing physician:  Discussed with Dr. Vanita Panda of the emergency room plan of care  Decision to obtain old records:   Yes  Review and summation of old records:   [name] had pancreatitis in 2014 and was admitted for the same at that time  Active Problems:   Acute pancreatitis   Assessment/Plan   SIRS with acute pancreatitis potentially alcoholic versus other  Obtain lipid panel stat  Obtain ultrasound abdomen pelvis stat-CT scan does not show any  ductal dilatation so doubt choledocholithiasis in addition to relatively normal LFTs  Has been started in the recent past on Flagyl and bismuth for H. pylori which are not usually implicated in pancreatitis-monitor trends of LFTs  Saline 1 more liter bolus in the ED and then rate 15o per hour times 18 hours until reevaluated  Allow soft diet-patient has been counseled to back off if he does not feel comfortable eating it  Pain control with morphine if needed   History of MI status post stent in the past 2012 follows with Dr. Terrence Dupont  Continue Coreg 325 twice daily with a sip of water in addition to amlodipine-holding aspirin at this time holding lisinopril given AKI as below and HCTZ  Holding statin at this time   AKI on admission secondary to volume depletion/SIRS  Volume replete as above-holding lisinopril HCTZ  Would hold glipizide at this time given drop in GFR  Looks like he is on duplicate of medications and pharmacy will have to confirm whether he is actually on [place] in addition to hydro-Diuril   Recent surgery low back  Performed  through the [facility]  Outpatient management  Uses a walker  Seems quite ambulatory at baseline-if needed we will get therapy services to see him   DM TY 2  Nursing to obtain a.m. sugars and if above 180 would treat with sliding scale supplementation   Severity of Illness: The appropriate patient status for this patient is INPATIENT. Inpatient status is judged to be reasonable and necessary in order to provide the required intensity of service to ensure the patient's safety. The patient's presenting symptoms, physical exam findings, and initial radiographic and laboratory data in the context of their chronic comorbidities is felt to place them at high risk for further clinical deterioration. Furthermore, it is not anticipated that the patient will be medically stable for discharge from the hospital within 2 midnights of admission. The following factors support the patient status of inpatient.   " The patient's presenting symptoms include nausea vomiting abdominal pain. " The worrisome physical exam findings include abdominal discomfort vomiting. " The initial radiographic and laboratory data are worrisome because of pancreatitis. " The chronic co-morbidities include MI etc. etc.   * I certify that at the point of admission it is my clinical judgment that the patient will require inpatient hospital care spanning beyond 2 midnights from the point of admission due to high intensity of service, high risk for further deterioration and high frequency of surveillance required.*   DVT prophylaxis: Lovenox Code Status: Full confirmed at bedside Family Communication: None Consults called: None yet  Time spent: 45 minutes minutes  Anthony Au, MD Jerl Mina my NP partners at night for Care related issues] [facility] --[employer] [employer] [place] , [email or web address]; password [facility]  04/04/2020, 5:29 PM

## 2020-04-04 NOTE — ED Triage Notes (Signed)
Patient c/o N/V since Sunday. Patient unable to tolerate PO intake.   Reports lower abdominal pain.    Patient has been treat for pylori in past per patient.   Denies urinary sx.   A/Ox4 Ambulatory in triage.

## 2020-04-04 NOTE — ED Notes (Signed)
Pt does not think he will be able to give a urine sample because he has not been able to keep anything down

## 2020-04-04 NOTE — ED Notes (Signed)
Admitting MD at bedside.

## 2020-04-04 NOTE — ED Notes (Signed)
Pt transported to CT ?

## 2020-04-05 ENCOUNTER — Inpatient Hospital Stay (HOSPITAL_COMMUNITY): Payer: No Typology Code available for payment source

## 2020-04-05 ENCOUNTER — Other Ambulatory Visit: Payer: Self-pay

## 2020-04-05 LAB — CBC WITH DIFFERENTIAL/PLATELET
Abs Immature Granulocytes: 0.02 10*3/uL (ref 0.00–0.07)
Basophils Absolute: 0 10*3/uL (ref 0.0–0.1)
Basophils Relative: 0 %
Eosinophils Absolute: 0 10*3/uL (ref 0.0–0.5)
Eosinophils Relative: 0 %
HCT: 36.5 % — ABNORMAL LOW (ref 39.0–52.0)
Hemoglobin: 12.7 g/dL — ABNORMAL LOW (ref 13.0–17.0)
Immature Granulocytes: 0 %
Lymphocytes Relative: 18 %
Lymphs Abs: 1.3 10*3/uL (ref 0.7–4.0)
MCH: 32.5 pg (ref 26.0–34.0)
MCHC: 34.8 g/dL (ref 30.0–36.0)
MCV: 93.4 fL (ref 80.0–100.0)
Monocytes Absolute: 0.8 10*3/uL (ref 0.1–1.0)
Monocytes Relative: 12 %
Neutro Abs: 4.9 10*3/uL (ref 1.7–7.7)
Neutrophils Relative %: 70 %
Platelets: 152 10*3/uL (ref 150–400)
RBC: 3.91 MIL/uL — ABNORMAL LOW (ref 4.22–5.81)
RDW: 13.7 % (ref 11.5–15.5)
WBC: 7 10*3/uL (ref 4.0–10.5)
nRBC: 0 % (ref 0.0–0.2)

## 2020-04-05 LAB — COMPREHENSIVE METABOLIC PANEL
ALT: 15 U/L (ref 0–44)
AST: 16 U/L (ref 15–41)
Albumin: 3.6 g/dL (ref 3.5–5.0)
Alkaline Phosphatase: 53 U/L (ref 38–126)
Anion gap: 10 (ref 5–15)
BUN: 12 mg/dL (ref 6–20)
CO2: 23 mmol/L (ref 22–32)
Calcium: 8.8 mg/dL — ABNORMAL LOW (ref 8.9–10.3)
Chloride: 97 mmol/L — ABNORMAL LOW (ref 98–111)
Creatinine, Ser: 0.75 mg/dL (ref 0.61–1.24)
GFR calc Af Amer: >60 mL/min (ref >60)
GFR calc non Af Amer: >60 mL/min (ref >60)
Glucose, Bld: 228 mg/dL — ABNORMAL HIGH (ref 70–99)
Potassium: 3.4 mmol/L — ABNORMAL LOW (ref 3.5–5.1)
Sodium: 130 mmol/L — ABNORMAL LOW (ref 135–145)
Total Bilirubin: 1 mg/dL (ref 0.3–1.2)
Total Protein: 7 g/dL (ref 6.5–8.1)

## 2020-04-05 LAB — HIV ANTIBODY (ROUTINE TESTING W REFLEX): HIV Screen 4th Generation wRfx: NONREACTIVE

## 2020-04-05 LAB — GLUCOSE, CAPILLARY
Glucose-Capillary: 206 mg/dL — ABNORMAL HIGH (ref 70–99)
Glucose-Capillary: 221 mg/dL — ABNORMAL HIGH (ref 70–99)
Glucose-Capillary: 234 mg/dL — ABNORMAL HIGH (ref 70–99)
Glucose-Capillary: 232 mg/dL — ABNORMAL HIGH (ref 70–99)

## 2020-04-05 MED ORDER — CARVEDILOL 25 MG PO TABS
25.0000 mg | ORAL_TABLET | Freq: Two times a day (BID) | ORAL | Status: DC
  Administered 2020-04-05 – 2020-04-07 (×6): 25 mg via ORAL
  Filled 2020-04-05 (×6): qty 1

## 2020-04-05 MED ORDER — AMLODIPINE BESYLATE 5 MG PO TABS
5.0000 mg | ORAL_TABLET | Freq: Every day | ORAL | Status: DC
  Administered 2020-04-05 – 2020-04-07 (×4): 5 mg via ORAL
  Filled 2020-04-05 (×4): qty 1

## 2020-04-05 MED ORDER — LORAZEPAM 2 MG/ML IJ SOLN
1.0000 mg | Freq: Once | INTRAMUSCULAR | Status: AC
  Administered 2020-04-05: 2 mg via INTRAVENOUS
  Filled 2020-04-05: qty 1

## 2020-04-05 MED ORDER — PROMETHAZINE HCL 25 MG/ML IJ SOLN
12.5000 mg | Freq: Four times a day (QID) | INTRAMUSCULAR | Status: DC | PRN
  Administered 2020-04-06 (×2): 12.5 mg via INTRAVENOUS
  Filled 2020-04-05 (×3): qty 1

## 2020-04-05 MED ORDER — PRO-STAT SUGAR FREE PO LIQD
30.0000 mL | Freq: Two times a day (BID) | ORAL | Status: DC
  Administered 2020-04-05 – 2020-04-07 (×5): 30 mL via ORAL
  Filled 2020-04-05 (×4): qty 30

## 2020-04-05 MED ORDER — KATE FARMS STANDARD 1.4 PO LIQD
325.0000 mL | Freq: Two times a day (BID) | ORAL | Status: DC
  Administered 2020-04-05: 325 mL via ORAL
  Filled 2020-04-05 (×5): qty 325

## 2020-04-05 MED ORDER — ADULT MULTIVITAMIN W/MINERALS CH
1.0000 | ORAL_TABLET | Freq: Every day | ORAL | Status: DC
  Administered 2020-04-05 – 2020-04-07 (×3): 1 via ORAL
  Filled 2020-04-05 (×3): qty 1

## 2020-04-05 MED ORDER — INSULIN ASPART 100 UNIT/ML ~~LOC~~ SOLN
2.0000 [IU] | Freq: Three times a day (TID) | SUBCUTANEOUS | Status: DC
  Administered 2020-04-05 – 2020-04-07 (×6): 2 [IU] via SUBCUTANEOUS

## 2020-04-05 MED ORDER — GADOBUTROL 1 MMOL/ML IV SOLN
7.5000 mL | Freq: Once | INTRAVENOUS | Status: AC | PRN
  Administered 2020-04-05: 7.5 mL via INTRAVENOUS

## 2020-04-05 MED ORDER — INSULIN GLARGINE 100 UNIT/ML ~~LOC~~ SOLN
25.0000 [IU] | Freq: Every day | SUBCUTANEOUS | Status: DC
  Administered 2020-04-05 – 2020-04-06 (×2): 25 [IU] via SUBCUTANEOUS
  Filled 2020-04-05 (×2): qty 0.25

## 2020-04-05 MED ORDER — INSULIN ASPART 100 UNIT/ML ~~LOC~~ SOLN
0.0000 [IU] | Freq: Three times a day (TID) | SUBCUTANEOUS | Status: DC
  Administered 2020-04-05 – 2020-04-06 (×3): 3 [IU] via SUBCUTANEOUS
  Administered 2020-04-06: 2 [IU] via SUBCUTANEOUS
  Administered 2020-04-06 – 2020-04-07 (×2): 1 [IU] via SUBCUTANEOUS

## 2020-04-05 NOTE — Progress Notes (Signed)
PROGRESS NOTE    [name]  XVQ:[id number] DOB: 1966-07-16 DOA: 04/04/2020 PCP: Charolette Forward, MD    Chief Complaint  Patient presents with  . Nausea  . 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 Terrence Dupont locally DM TY 2 Occasional drinker Smoker Comes to the hospital [facility] long ED 04/04/2020 Abdominal pain since 6/13 no aggravating relieving factors-has not eaten a full diet since that time + Nausea, + emesis "12 times today"-sometimes bilious no blood No hematemesis No melena Not dizzy no other issues  Tells me recently Rx H pylori based on CT scan??-Was being treated with bismuth as well as Flagyl-(quite unusual usually 3 or 4 drug therapy) Was supposed to see the [place] on the sixth of next month?   Assessment & Plan:   Active Problems:   Acute pancreatitis    SIRS with acute pancreatitis potentially alcoholic versus other ? Patient admits to me today that he drank a pint of vodka on Friday prior to coming in ? I doubt that this is hypertriglyceridemia or any other exotic presentation of occult pancreatitis and I have counseled him to quit drinking ? Pain control with morphine if needed  ? ultrasound showed gallbladder polyps-curb sided surgery-recommending MRCP to delineate and better characterize if there is any other pathology which is pending at this time   History of MI status post stent in the past 2012 follows with Dr. Terrence Dupont ? Continue Coreg 3.25 twice daily with a sip of water in addition to amlodipine-holding aspirin at this time holding lisinopril given AKI as below  ? Meds have not been reconciled I have asked pharmacy to look at the med list in the room he is not taking any HCTZ at this time   AKI on admission secondary to volume depletion/SIRS  Volume replete as  above-holding lisinopril HCTZ\  Would hold glipizide at this time  This is resolved greatly with IV fluid repletion and I have cut back his fluid rate to 75 cc an hour   Recent surgery low back  Performed through the [facility]  Outpatient management  Uses a walker  Seems quite ambulatory at baseline-if needed we will get therapy services to see him   DM TY 2  Blood sugar slightly elevated  He admits to me that he takes Lantus 50 units and I have cut him back to 25 units in addition to sliding scale see above discussion    DVT prophylaxis: Lovenox Code Status: Full Family Communication: None Disposition: Inpatient pending resolution  Status is: Inpatient  Remains inpatient appropriate because:Ongoing active pain requiring inpatient pain management   Dispo: The patient is from: Home              Anticipated d/c is to: Home              Anticipated d/c date is: 2 days              Patient currently is not medically stable to d/c.       Consultants:   None  Procedures: None  Antimicrobials: None   Subjective: Eating drinking fair tolerating diet No nausea no vomiting Pain 5/10 in upper quadrant  Objective: Vitals:   04/05/20 0013 04/05/20 0424 04/05/20 0751 04/05/20 1334  BP: (!) 151/115 (!) 146/95 (!) 164/101 (!) 165/105  Pulse: 95 88 83 76  Resp:  14 18 18   Temp: 98.8 F (37.1 C) 98.7 F (37.1 C) 98.3 F (36.8 C) 98.6 F (37 C)  TempSrc: Oral Oral Oral Oral  SpO2: 99% 96% 98% 98%  Weight:      Height:        Intake/Output Summary (Last 24 hours) at 04/05/2020 1638 Last data filed at 04/05/2020 1623 Gross per 24 hour  Intake 1864.82 ml  Output 2100 ml  Net -235.18 ml   Filed Weights   04/04/20 2018  Weight: 65.4 kg    Examination:  General exam: EOMI NCAT no focal deficit Respiratory system: Clinically clear Cardiovascular system: S1-S2 no murmur Gastrointestinal system: Right upper quadrant and epigastric tenderness Murphy sign  positive. Central nervous system: Intact Extremities: No lower extremity edema Skin: No rash Psychiatry: Euthymic congruent pleasant    Data Reviewed: I have personally reviewed following labs and imaging studies Sodium 130 potassium 3.4 Hemoglobin 12.7  Radiology Studies: US Abdomen Complete  Result Date: 04/04/2020 CLINICAL DATA:  Acute pancreatitis abdomen pain EXAM: ABDOMEN ULTRASOUND COMPLETE COMPARISON:  CT 04/04/2020 FINDINGS: Gallbladder: No shadowing stones. Multiple gallbladder polyps measuring up to 9.6 mm. Normal wall thickness. Negative sonographic Murphy. Common bile duct: Diameter: 5.1 mm Liver: Heterogeneous echotexture with areas of increased and decreased echogenicity, likely reflecting heterogenous steatosis. Portal vein is patent on color Doppler imaging with normal direction of blood flow towards the liver. IVC: No abnormality visualized. Pancreas: Hypoechoic without focal fluid collection. Spleen: Size and appearance within normal limits. Right Kidney: Length: 13.1 cm. Echogenicity within normal limits. No mass or hydronephrosis visualized. Left Kidney: Length: 13 cm. Echogenicity within normal limits. No mass or hydronephrosis visualized. Abdominal aorta: No aneurysm visualized. Other findings: Trace perihepatic and perisplenic fluid. IMPRESSION: 1. Multiple gallbladder polyps measuring up to 9.6 mm in size but without sonographic findings for acute gallbladder disease. Consider surgical consultation for the gallbladder polyps which are protein 1 cm in size 2. Heterogenous steatosis of the liver 3. Hypoechoic pancreas likely due to pancreatitis 4. Trace perihepatic and perisplenic fluid Electronically Signed   By: Donavan Foil M.D.   On: 04/04/2020 19:15   CT Abdomen Pelvis W Contrast  Result Date: 04/04/2020 CLINICAL DATA:  Nausea and vomiting, lower abdominal pain EXAM: CT ABDOMEN AND PELVIS WITH CONTRAST TECHNIQUE: Multidetector CT imaging of the abdomen and pelvis was  performed using the standard protocol following bolus administration of intravenous contrast. CONTRAST:  144mL OMNIPAQUE IOHEXOL 300 MG/ML  SOLN COMPARISON:  01/05/2013 FINDINGS: Lower chest: No acute pleural or parenchymal lung disease. Hepatobiliary: Diffuse hepatic steatosis. No focal liver abnormalities. The gallbladder is unremarkable. Pancreas: Inflammatory changes are seen surrounding the pancreas, consistent with acute pancreatitis. Parenchyma enhances normally. No fluid collection, pseudocyst, or abscess. Spleen: Normal in size without focal abnormality. Adrenals/Urinary Tract: Kidneys enhance normally. Small cortical cysts are unchanged. Adrenals are unremarkable. Bladder is grossly normal. Stomach/Bowel: No bowel obstruction or ileus. Normal appendix right lower quadrant. No bowel wall thickening or inflammatory change. Vascular/Lymphatic: Aortic atherosclerosis. Vascular structures are patent. No enlarged abdominal or pelvic lymph nodes. Reproductive: Prostate is unremarkable. Other: Small amount of free fluid within the lower abdomen and pelvis. No free intra-abdominal gas. There is a small umbilical hernia containing a portion of small bowel. No obstruction, strangulation, or incarceration. Musculoskeletal: No acute or destructive bony lesions. Reconstructed images demonstrate no additional findings. IMPRESSION: 1. Acute uncomplicated pancreatitis, with diffuse inflammatory changes surrounding the entirety of the pancreatic parenchyma. No pseudocyst,  fluid collection, or abscess. 2. Hepatic steatosis. 3. Small umbilical hernia containing a portion of small bowel. 4. Trace ascites. 5.  Aortic Atherosclerosis (ICD10-I70.0). Electronically Signed   By: Randa Ngo M.D.   On: 04/04/2020 16:50      Scheduled Meds: . amLODipine  5 mg Oral Daily  . carvedilol  25 mg Oral BID WC  . enoxaparin (LOVENOX) injection  40 mg Subcutaneous Q24H  . feeding supplement (KATE FARMS STANDARD 1.4)  325 mL Oral  BID BM  . feeding supplement (PRO-STAT SUGAR FREE 64)  30 mL Oral BID  . insulin aspart  0-9 Units Subcutaneous TID WC  . insulin aspart  2 Units Subcutaneous TID WC  . insulin glargine  25 Units Subcutaneous QHS  . multivitamin with minerals  1 tablet Oral Daily  . nicotine  14 mg Transdermal Daily  . sodium chloride flush  3 mL Intravenous Once   Continuous Infusions: . sodium chloride 150 mL/hr at 04/05/20 0624     LOS: 1 day    Time s 15 minutes    Nita Sells, MD [facility]   To contact the attending provider between 7A-7P or the covering provider during after hours 7P-7A, please log into the web site [email or web address] and access using universal [place] password for that web site. If you do not have the password, please call the hospital operator.  04/05/2020, 4:38 PM

## 2020-04-05 NOTE — Progress Notes (Signed)
Initial Nutrition Assessment  DOCUMENTATION CODES:    (unable to assess for malnutrition at this time.)  INTERVENTION:  - will order [employer] BID, each supplement provides 455 kcal and 20 grams protein. - will order 30 mL Prostat BID, each supplement provides 100 kcal and 15 grams of protein. - will order 1 tablet multivitamin with minerals/day. - will complete NFPE at follow-up.    NUTRITION DIAGNOSIS:   Inadequate oral intake related to acute illness, nausea, vomiting as evidenced by per patient/family report.  GOAL:   Patient will meet greater than or equal to 90% of their needs  MONITOR:   PO intake, Supplement acceptance, Labs, Weight trends  REASON FOR ASSESSMENT:   Malnutrition Screening Tool  ASSESSMENT:   54 year old male who receives medical care at the [place]. He has a medical history of MI, HTN, hypercholesterolemia, acid reflux, alcohol abuse, CAD, DM, and sleep apnea. He had back surgery 2 months ago. He presented to the ED on 6/16 for abdominal pain which began on 6/13 and poor intake of meals since that time. Reported nausea and vomiting up to 12 times/day. Patient had reported to staff in the ED that recent CT scan led to dx of H. pylori.  Patient is currently out of the room to MRI; unable to obtain nutrition-related information at this time. No intakes documented since admission yesterday.   Patient has not been seen by a [place] RD since 08/22/2015.  Per chart review, weight yesterday was 144 lb and weight on 12/29/19 was 175 lb. This indicates 31 lb weight loss (17.7% body weight) in the past 3 months; significant for time frame.  Suspect patient meets criteria for some degree of malnutrition, but unable to confirm at this time.   Per notes: - SIRS - CT did not show any dilatation; plan for ultrasound abdomen/pelvis - AKI 2/2 volume depletion - recent back surgery at the [facility] reviewed; CBGs: 206 and 221 mg/dl, Na: 130 mmol/l, K: 3.4 mmol/l, Cl:  97 mmol/l. Medications reviewed; sliding scale novolog, 2 units novolog TID, 25 units lantus/day.  IVF; NS @ 150 ml/hr.     NUTRITION - FOCUSED PHYSICAL EXAM:  unable to complete at this time.  Diet Order:   Diet Order            DIET SOFT Room service appropriate? Yes; Fluid consistency: Thin  Diet effective now                 EDUCATION NEEDS:   Not appropriate for education at this time  Skin:  Skin Assessment: Reviewed RN Assessment  Last BM:  6/14  Height:   Ht Readings from Last 1 Encounters:  04/04/20 5\' 11"  (1.803 m)    Weight:   Wt Readings from Last 1 Encounters:  04/04/20 65.4 kg    Estimated Nutritional Needs:  Kcal:  9371-6967 kcal Protein:  110-125 grams Fluid:  >/= 2.5 L/day     Jarome Matin, MS, RD, LDN, CNSC Inpatient Clinical Dietitian RD pager # available in AMION  After hours/weekend pager # available in [facility]

## 2020-04-05 NOTE — TOC Progression Note (Signed)
Transition of Care [facility]) - Progression Note    Patient Details  Name: [name] MRN: [id number] Date of Birth: [birth year]  Transition of Care [facility]) CM/SW Contact  Purcell Mouton, RN Phone Number: 04/05/2020, 10:49 AM  Clinical Narrative:    [facility]'s [facility]. Pt from home alone with [place] from [place].    Expected Discharge Plan: [place] Barriers to Discharge: No Barriers Identified  Expected Discharge Plan and Services Expected Discharge Plan: [place] arrangements for the past 2 months: Single Family Home                                       Social Determinants of Health (SDOH) Interventions    Readmission Risk Interventions No flowsheet data found.

## 2020-04-06 LAB — COMPREHENSIVE METABOLIC PANEL
ALT: 18 U/L (ref 0–44)
AST: 22 U/L (ref 15–41)
Albumin: 3.5 g/dL (ref 3.5–5.0)
Alkaline Phosphatase: 54 U/L (ref 38–126)
Anion gap: 10 (ref 5–15)
BUN: 8 mg/dL (ref 6–20)
CO2: 27 mmol/L (ref 22–32)
Calcium: 9.3 mg/dL (ref 8.9–10.3)
Chloride: 90 mmol/L — ABNORMAL LOW (ref 98–111)
Creatinine, Ser: 0.56 mg/dL — ABNORMAL LOW (ref 0.61–1.24)
GFR calc Af Amer: >60 mL/min (ref >60)
GFR calc non Af Amer: >60 mL/min (ref >60)
Glucose, Bld: 200 mg/dL — ABNORMAL HIGH (ref 70–99)
Potassium: 2.8 mmol/L — ABNORMAL LOW (ref 3.5–5.1)
Sodium: 127 mmol/L — ABNORMAL LOW (ref 135–145)
Total Bilirubin: 0.8 mg/dL (ref 0.3–1.2)
Total Protein: 7.4 g/dL (ref 6.5–8.1)

## 2020-04-06 LAB — GLUCOSE, CAPILLARY
Glucose-Capillary: 204 mg/dL — ABNORMAL HIGH (ref 70–99)
Glucose-Capillary: 195 mg/dL — ABNORMAL HIGH (ref 70–99)
Glucose-Capillary: 136 mg/dL — ABNORMAL HIGH (ref 70–99)
Glucose-Capillary: 167 mg/dL — ABNORMAL HIGH (ref 70–99)

## 2020-04-06 LAB — MAGNESIUM: Magnesium: 1.6 mg/dL — ABNORMAL LOW (ref 1.7–2.4)

## 2020-04-06 MED ORDER — TRAMADOL HCL 50 MG PO TABS
50.0000 mg | ORAL_TABLET | Freq: Once | ORAL | Status: AC
  Administered 2020-04-06: 50 mg via ORAL
  Filled 2020-04-06: qty 1

## 2020-04-06 MED ORDER — POTASSIUM CHLORIDE CRYS ER 20 MEQ PO TBCR
40.0000 meq | EXTENDED_RELEASE_TABLET | Freq: Two times a day (BID) | ORAL | Status: AC
  Administered 2020-04-06 (×2): 40 meq via ORAL
  Filled 2020-04-06 (×2): qty 2

## 2020-04-06 NOTE — Progress Notes (Signed)
Patient slightly confused and anxious upon assessment. Pointing to bottom of the bed where blankets are stating "what is that guy doing hiding under there." Reassured patient that no one is under the blankets. Patient then states "when does Verdis Frederickson need my stool sample?" RN asked who Verdis Frederickson was and why she would need a stool sample. Patient states "Verdis Frederickson is my nurse and has been asking me for a stool sample all day." RN explained that Verdis Frederickson was not his nurse and that a stool sample was not necessary. Patient able to answer all orientation questions appropriately. Patient appears slightly sweaty and anxious, has gotten out of bed multiple times without calling for assistance. Made on call provider, [place] NP, aware of situation. No new orders at this time.

## 2020-04-06 NOTE — Progress Notes (Addendum)
PROGRESS NOTE    [name]  CXK:[id number] DOB: 27-Aug-1966 DOA: 04/04/2020 PCP: Charolette Forward, MD    Chief Complaint  Patient presents with  . Nausea  . 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 Terrence Dupont locally DM TY 2 Occasional drinker Smoker Comes to the hospital [facility] long ED 04/04/2020 Abdominal pain since 6/13 no aggravating relieving factors-has not eaten a full diet since that time + Nausea, + emesis "12 times today"-sometimes bilious no blood No hematemesis No melena Not dizzy no other issues  Tells me recently Rx H pylori based on CT scan??-Was being treated with bismuth as well as Flagyl-(quite unusual usually 3 or 4 drug therapy) Was supposed to see the [facility] on the sixth of next month?   Assessment & Plan:   Active Problems:   Acute pancreatitis    SIRS with acute pancreatitis potentially alcoholic versus other ? Patient admits to me today that he drank a pint of vodka on Friday prior to coming in ? Doubtful other causes-counseled him to quit drinking ? Pain control with morphine if needed  ? ultrasound showed gallbladder polyps-curb sided surgery MRCP negative for any pathology ? Tolerating diet fairly well although now hyponatremic   History of MI status post stent in the past 2012 follows with Dr. Terrence Dupont ? Continue Coreg 3.25 twice daily with a sip of water in addition to amlodipine-holding aspirin at this time holding lisinopril given AKI as below  ? Meds have not been reconciled I have asked pharmacy to look at the med list in the room he is not taking any HCTZ at this time   Likely dilutional anemia  Rule out GI bleed-patient was taking bismuth which is notorious to cause dark stools  Nurse aware on next dark appearing stool to Hemoccult the same  Trend  hemoglobin in a.m.   AKI on admission secondary to volume depletion/SIRS  Volume replete as above-holding lisinopril HCTZ\  Would hold glipizide at this time  T saline lock today   Hyponatremia hypokalemia  Replacing with IV therapies today and ensure that magnesium is also replaced which is 1.6  Will need electrolytes to normalize a little more prior to discharge   Recent surgery low back  Performed through the [facility]  Outpatient management  Uses a walker  Seems quite ambulatory at baseline-if needed we will get therapy services to see him   DM TY 2  Blood sugar slightly elevated  He admits to me that he takes Lantus 50 units and I have cut him back to 25 units in addition to sliding scale see above discussion    DVT prophylaxis: Lovenox Code Status: Full Family Communication: None Disposition: Inpatient pending resolution  Status is: Inpatient  Remains inpatient appropriate because:Ongoing active pain requiring inpatient pain management   Dispo: The patient is from: Home              Anticipated d/c is to: Home              Anticipated d/c date is: 2 days              Patient currently is not medically stable to d/c.   Consultants:   curbsided gen surg  Procedures:   MRI 6/17 IMPRESSION: 1. Motion limited examination. 2. Mild interstitial edematous pancreatitis similar  to slightly improved when compared to the previous study. No focal fluid collection. No sign of pancreatic necrosis 3. No sign of pancreatic divisum 4. No biliary ductal dilation or choledocholithiasis. 5. Moderate to marked hepatic steatosis. 6. Trace ascites.  Antimicrobials: None   Subjective: Doing fair asking for diet Questions why he is on fluid restriction Otherwise looks well and pain is about 4/10 in abdomen  Objective: Vitals:   04/05/20 1334 04/05/20 2104 04/05/20 2129 04/06/20 0542  BP: (!) 165/105 (!) 191/109 (!) 158/77 (!) 169/107  Pulse: 76 77 88 74  Resp:  18 19 (!) 22 20  Temp: 98.6 F (37 C) 98.3 F (36.8 C) 98.3 F (36.8 C) 98.6 F (37 C)  TempSrc: Oral Oral Oral Oral  SpO2: 98% 100% 93% 97%  Weight:      Height:        Intake/Output Summary (Last 24 hours) at 04/06/2020 0908 Last data filed at 04/06/2020 6314 Gross per 24 hour  Intake 1910.69 ml  Output 4100 ml  Net -2189.31 ml   Filed Weights   04/04/20 2018  Weight: 65.4 kg    Examination:  General exam: EOMI NCAT no focal deficit Respiratory system: Clinically clear no added sound Cardiovascular system: S1-S2 no murmur Gastrointestinal system: Right upper quadrant but seems diminished from prior Central nervous system: Intact Extremities: No lower extremity edema Skin: No rash Psychiatry: Euthymic congruent pleasant    Data Reviewed: I have personally reviewed following labs and imaging studies Sodium 130-->127 potassium 3.4-->2.8 BUN/creatinine down to 8/0.5 Hemoglobin 12.7  Radiology Studies: US Abdomen Complete  Result Date: 04/04/2020 CLINICAL DATA:  Acute pancreatitis abdomen pain EXAM: ABDOMEN ULTRASOUND COMPLETE COMPARISON:  CT 04/04/2020 FINDINGS: Gallbladder: No shadowing stones. Multiple gallbladder polyps measuring up to 9.6 mm. Normal wall thickness. Negative sonographic Murphy. Common bile duct: Diameter: 5.1 mm Liver: Heterogeneous echotexture with areas of increased and decreased echogenicity, likely reflecting heterogenous steatosis. Portal vein is patent on color Doppler imaging with normal direction of blood flow towards the liver. IVC: No abnormality visualized. Pancreas: Hypoechoic without focal fluid collection. Spleen: Size and appearance within normal limits. Right Kidney: Length: 13.1 cm. Echogenicity within normal limits. No mass or hydronephrosis visualized. Left Kidney: Length: 13 cm. Echogenicity within normal limits. No mass or hydronephrosis visualized. Abdominal aorta: No aneurysm visualized. Other findings: Trace perihepatic and  perisplenic fluid. IMPRESSION: 1. Multiple gallbladder polyps measuring up to 9.6 mm in size but without sonographic findings for acute gallbladder disease. Consider surgical consultation for the gallbladder polyps which are protein 1 cm in size 2. Heterogenous steatosis of the liver 3. Hypoechoic pancreas likely due to pancreatitis 4. Trace perihepatic and perisplenic fluid Electronically Signed   By: Donavan Foil M.D.   On: 04/04/2020 19:15   CT Abdomen Pelvis W Contrast  Result Date: 04/04/2020 CLINICAL DATA:  Nausea and vomiting, lower abdominal pain EXAM: CT ABDOMEN AND PELVIS WITH CONTRAST TECHNIQUE: Multidetector CT imaging of the abdomen and pelvis was performed using the standard protocol following bolus administration of intravenous contrast. CONTRAST:  151mL OMNIPAQUE IOHEXOL 300 MG/ML  SOLN COMPARISON:  01/05/2013 FINDINGS: Lower chest: No acute pleural or parenchymal lung disease. Hepatobiliary: Diffuse hepatic steatosis. No focal liver abnormalities. The gallbladder is unremarkable. Pancreas: Inflammatory changes are seen surrounding the pancreas, consistent with acute pancreatitis. Parenchyma enhances normally. No fluid collection, pseudocyst, or abscess. Spleen: Normal in size without focal abnormality. Adrenals/Urinary Tract: Kidneys enhance normally. Small cortical cysts are unchanged. Adrenals are unremarkable. Bladder is grossly normal. Stomach/Bowel:  No bowel obstruction or ileus. Normal appendix right lower quadrant. No bowel wall thickening or inflammatory change. Vascular/Lymphatic: Aortic atherosclerosis. Vascular structures are patent. No enlarged abdominal or pelvic lymph nodes. Reproductive: Prostate is unremarkable. Other: Small amount of free fluid within the lower abdomen and pelvis. No free intra-abdominal gas. There is a small umbilical hernia containing a portion of small bowel. No obstruction, strangulation, or incarceration. Musculoskeletal: No acute or destructive bony  lesions. Reconstructed images demonstrate no additional findings. IMPRESSION: 1. Acute uncomplicated pancreatitis, with diffuse inflammatory changes surrounding the entirety of the pancreatic parenchyma. No pseudocyst, fluid collection, or abscess. 2. Hepatic steatosis. 3. Small umbilical hernia containing a portion of small bowel. 4. Trace ascites. 5.  Aortic Atherosclerosis (ICD10-I70.0). Electronically Signed   By: Randa Ngo M.D.   On: 04/04/2020 16:50   MR 3D Recon At Scanner  Result Date: 04/05/2020 CLINICAL DATA:  Suspected pancreatitis EXAM: MRI ABDOMEN WITHOUT AND WITH CONTRAST (INCLUDING MRCP) TECHNIQUE: Multiplanar multisequence MR imaging of the abdomen was performed both before and after the administration of intravenous contrast. Heavily T2-weighted images of the biliary and pancreatic ducts were obtained, and three-dimensional MRCP images were rendered by post processing. CONTRAST:  7.66mL GADAVIST GADOBUTROL 1 MMOL/ML IV SOLN COMPARISON:  Ultrasound abdomen 04/04/2020 and CT 04/04/2020 FINDINGS: Lower chest: Incidental imaging of the lung bases is unremarkable. No consolidation. No pleural effusion. No pericardial fluid. Hepatobiliary: Moderate to marked hepatic steatosis. No pericholecystic stranding. No biliary ductal dilation or filling defect. No suspicious focal hepatic lesion though assessment limited by respiratory motion. Portal vein is patent. Splenic vein is patent. Pancreas: Mild peripancreatic edema. No focal peripancreatic fluid or significant stranding or fluid in the anterior pararenal space. Spleen:  Spleen normal size.  No focal lesion. Adrenals/Urinary Tract: Adrenal glands are normal. Cysts in the bilateral kidneys. No hydronephrosis. Stomach/Bowel: Normal to the extent evaluated. Motion limits assessment on today's study and study is not protocol for bowel evaluation. Vascular/Lymphatic: Vascular structures in the abdomen are patent. No aneurysmal dilation of the abdominal  aorta. Other:  Trace ascites. Musculoskeletal: No acute musculoskeletal process. IMPRESSION: 1. Motion limited examination. 2. Mild interstitial edematous pancreatitis similar to slightly improved when compared to the previous study. No focal fluid collection. No sign of pancreatic necrosis 3. No sign of pancreatic divisum 4. No biliary ductal dilation or choledocholithiasis. 5. Moderate to marked hepatic steatosis. 6. Trace ascites. Electronically Signed   By: Zetta Bills M.D.   On: 04/05/2020 16:56   MR ABDOMEN MRCP W WO CONTAST  Result Date: 04/05/2020 CLINICAL DATA:  Suspected pancreatitis EXAM: MRI ABDOMEN WITHOUT AND WITH CONTRAST (INCLUDING MRCP) TECHNIQUE: Multiplanar multisequence MR imaging of the abdomen was performed both before and after the administration of intravenous contrast. Heavily T2-weighted images of the biliary and pancreatic ducts were obtained, and three-dimensional MRCP images were rendered by post processing. CONTRAST:  7.68mL GADAVIST GADOBUTROL 1 MMOL/ML IV SOLN COMPARISON:  Ultrasound abdomen 04/04/2020 and CT 04/04/2020 FINDINGS: Lower chest: Incidental imaging of the lung bases is unremarkable. No consolidation. No pleural effusion. No pericardial fluid. Hepatobiliary: Moderate to marked hepatic steatosis. No pericholecystic stranding. No biliary ductal dilation or filling defect. No suspicious focal hepatic lesion though assessment limited by respiratory motion. Portal vein is patent. Splenic vein is patent. Pancreas: Mild peripancreatic edema. No focal peripancreatic fluid or significant stranding or fluid in the anterior pararenal space. Spleen:  Spleen normal size.  No focal lesion. Adrenals/Urinary Tract: Adrenal glands are normal. Cysts in the bilateral kidneys. No hydronephrosis.  Stomach/Bowel: Normal to the extent evaluated. Motion limits assessment on today's study and study is not protocol for bowel evaluation. Vascular/Lymphatic: Vascular structures in the abdomen  are patent. No aneurysmal dilation of the abdominal aorta. Other:  Trace ascites. Musculoskeletal: No acute musculoskeletal process. IMPRESSION: 1. Motion limited examination. 2. Mild interstitial edematous pancreatitis similar to slightly improved when compared to the previous study. No focal fluid collection. No sign of pancreatic necrosis 3. No sign of pancreatic divisum 4. No biliary ductal dilation or choledocholithiasis. 5. Moderate to marked hepatic steatosis. 6. Trace ascites. Electronically Signed   By: Zetta Bills M.D.   On: 04/05/2020 16:56      Scheduled Meds: . amLODipine  5 mg Oral Daily  . carvedilol  25 mg Oral BID WC  . enoxaparin (LOVENOX) injection  40 mg Subcutaneous Q24H  . feeding supplement (KATE FARMS STANDARD 1.4)  325 mL Oral BID BM  . feeding supplement (PRO-STAT SUGAR FREE 64)  30 mL Oral BID  . insulin aspart  0-9 Units Subcutaneous TID WC  . insulin aspart  2 Units Subcutaneous TID WC  . insulin glargine  25 Units Subcutaneous QHS  . multivitamin with minerals  1 tablet Oral Daily  . nicotine  14 mg Transdermal Daily  . potassium chloride  40 mEq Oral BID  . sodium chloride flush  3 mL Intravenous Once   Continuous Infusions: . sodium chloride 75 mL/hr at 04/06/20 0512     LOS: 2 days    Time s 10 minutes    Nita Sells, MD [facility]   To contact the attending provider between 7A-7P or the covering provider during after hours 7P-7A, please log into the web site [email or web address] and access using universal [place] password for that web site. If you do not have the password, please call the hospital operator.  04/06/2020, 9:08 AM

## 2020-04-07 LAB — CBC WITH DIFFERENTIAL/PLATELET
Abs Immature Granulocytes: 0.01 10*3/uL (ref 0.00–0.07)
Basophils Absolute: 0 10*3/uL (ref 0.0–0.1)
Basophils Relative: 0 %
Eosinophils Absolute: 0 10*3/uL (ref 0.0–0.5)
Eosinophils Relative: 0 %
HCT: 37.9 % — ABNORMAL LOW (ref 39.0–52.0)
Hemoglobin: 13.6 g/dL (ref 13.0–17.0)
Immature Granulocytes: 0 %
Lymphocytes Relative: 25 %
Lymphs Abs: 1.4 10*3/uL (ref 0.7–4.0)
MCH: 33 pg (ref 26.0–34.0)
MCHC: 35.9 g/dL (ref 30.0–36.0)
MCV: 92 fL (ref 80.0–100.0)
Monocytes Absolute: 0.9 10*3/uL (ref 0.1–1.0)
Monocytes Relative: 15 %
Neutro Abs: 3.5 10*3/uL (ref 1.7–7.7)
Neutrophils Relative %: 60 %
Platelets: 179 10*3/uL (ref 150–400)
RBC: 4.12 MIL/uL — ABNORMAL LOW (ref 4.22–5.81)
RDW: 13.3 % (ref 11.5–15.5)
WBC: 5.9 10*3/uL (ref 4.0–10.5)
nRBC: 0 % (ref 0.0–0.2)

## 2020-04-07 LAB — COMPREHENSIVE METABOLIC PANEL
ALT: 20 U/L (ref 0–44)
AST: 23 U/L (ref 15–41)
Albumin: 3.5 g/dL (ref 3.5–5.0)
Alkaline Phosphatase: 60 U/L (ref 38–126)
Anion gap: 10 (ref 5–15)
BUN: 15 mg/dL (ref 6–20)
CO2: 27 mmol/L (ref 22–32)
Calcium: 9.8 mg/dL (ref 8.9–10.3)
Chloride: 89 mmol/L — ABNORMAL LOW (ref 98–111)
Creatinine, Ser: 0.61 mg/dL (ref 0.61–1.24)
GFR calc Af Amer: >60 mL/min (ref >60)
GFR calc non Af Amer: >60 mL/min (ref >60)
Glucose, Bld: 277 mg/dL — ABNORMAL HIGH (ref 70–99)
Potassium: 3.1 mmol/L — ABNORMAL LOW (ref 3.5–5.1)
Sodium: 126 mmol/L — ABNORMAL LOW (ref 135–145)
Total Bilirubin: 0.8 mg/dL (ref 0.3–1.2)
Total Protein: 7.5 g/dL (ref 6.5–8.1)

## 2020-04-07 LAB — MAGNESIUM: Magnesium: 1.5 mg/dL — ABNORMAL LOW (ref 1.7–2.4)

## 2020-04-07 LAB — SODIUM, URINE, RANDOM: Sodium, Ur: 23 mmol/L

## 2020-04-07 LAB — OSMOLALITY, URINE: Osmolality, Ur: 708 mOsm/kg (ref 300–900)

## 2020-04-07 LAB — GLUCOSE, CAPILLARY: Glucose-Capillary: 132 mg/dL — ABNORMAL HIGH (ref 70–99)

## 2020-04-07 MED ORDER — FUROSEMIDE 10 MG/ML IJ SOLN
20.0000 mg | Freq: Once | INTRAMUSCULAR | Status: AC
  Administered 2020-04-07: 20 mg via INTRAVENOUS
  Filled 2020-04-07: qty 2

## 2020-04-07 MED ORDER — MAGNESIUM SULFATE 2 GM/50ML IV SOLN
2.0000 g | Freq: Once | INTRAVENOUS | Status: AC
  Administered 2020-04-07: 2 g via INTRAVENOUS
  Filled 2020-04-07: qty 50

## 2020-04-07 MED ORDER — NICOTINE 21 MG/24HR TD PT24: 21.0000 mg | MEDICATED_PATCH | Freq: Every day | TRANSDERMAL | 0 refills | Status: DC

## 2020-04-07 NOTE — Discharge Summary (Addendum)
Physician Discharge Summary  [name] MOQ:[id number] DOB: [birth year] DOA: 04/04/2020  PCP: Charolette Forward, MD  Admit date: 04/04/2020 Discharge date: 04/07/2020  Time spent: 35 minutes  Recommendations for Outpatient Follow-up:  1. Patient should have titration of his blood pressure meds as an outpatient on outpatient ambulatory checks 2. Get Chem-12 CBC 1 week 3. Check outpatient magnesium in addition-patient has supplements of potassium and magnesium at home 4. 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 Terrence Dupont locally DM TY 2 Occasional drinker Smoker Comes to the hospital [facility] long ED 04/04/2020 Abdominal pain since 6/13 no aggravating relieving factors-has not eaten a full diet since that time + Nausea, + emesis "12 times today"-sometimes bilious no blood No hematemesis No melena Not dizzy no other issues  Tells me recently Rx H pylori based on CT scan??-Was being treated with bismuth as well as Flagyl-(quite unusual usually 3 or 4 drug therapy) Was supposed to see the [place] on the sixth of next month?  Hospital Course:   Acute pancreatitis    SIRS with acute pancreatitis potentially alcoholic versus other ? Patient admits to me today that he drank a pint of vodka on Friday prior to coming in ? Doubtful other causes-counseled him to quit drinking ? Pain control with morphine if needed  ? ultrasound showed gallbladder polyps-curb sided surgery MRCP negative for any  pathology   History of MI status post stent in the past 2012 follows with Dr. Terrence Dupont ? Resume home doses of Coreg and amlodipine on discharge ? Titrate meds as an outpatient as needed   Likely dilutional anemia ? Rule out GI bleed-patient was taking bismuth which is notorious to cause dark stools ? No further dark stool hemoglobin stable in the 13-12 range   AKI on admission secondary to volume depletion/SIRS  Improved on discharge   Hyponatremia hypokalemia  Replacing with IV therapies today  His sodium was 126 had a long discussion with him on the phone regarding risks of hyponatremia including confusion dizziness and falls and he is adamant about going home for personal matters  I have told him that it is against our medical advice that he goes home and he persists therefore he has signed AMA forms to go [place] and will follow up in outpatient with this regular doctor   Recent surgery low back  Performed through the [facility]  Outpatient management  Uses a walker  Seems quite ambulatory at baseline-if needed we will get therapy services to see him   DM TY 2  Blood sugar slightly elevated He admits to me that he takes Lantus 50 units and I resumed him on close to his home med regimen on discharge Discharge Exam: Vitals:   04/06/20 2234 04/07/20 0956  BP: (!) 175/118 (!) 160/118  Pulse: 82 89  Resp:    Temp:  98.1 F (36.7 C)  SpO2:      General: Awake alert coherent pleasant doing well eating drinking some mention  of confusion overnight but he is completely clear oriented and does not seem encephalopathic to me Cardiovascular: S1-S2 no murmur rub or gallop Respiratory: Clinically clear no added sound no rales no rhonchi Abdomen soft nontender no rebound at all ROM intact Euthymic pleasant and not jittery he does not have any shakes  Discharge Instructions   Discharge Instructions    Diet - low sodium heart healthy   Complete by: As  directed    Discharge instructions   Complete by: As directed    Please abstain for the foreseeable future from heavy drinking remember what we discussed in terms of standard alcoholic drink size and please try not to binge drink Your blood pressure is a little high but that is probably because you are hospitalized and I would recommend that you discuss this with your [place] physician Please try to quit smoking Eat a soft diet for the next several days and then graduated to regular diet try to avoid oily or very greasy food Good luck and be well   Increase activity slowly   Complete by: As directed      Allergies as of 04/07/2020   No Known Allergies     Medication List    STOP taking these medications   aspirin 325 MG tablet   bismuth subsalicylate 462 MG chewable tablet Commonly known as: PEPTO BISMOL   hydrochlorothiazide 25 MG tablet Commonly known as: HYDRODIURIL   HYDROcodone-acetaminophen 5-325 MG tablet Commonly known as: NORCO/VICODIN   metroNIDAZOLE 250 MG tablet Commonly known as: FLAGYL   Vitamin D 50 MCG (2000 UT) tablet     TAKE these medications   amLODipine 5 MG tablet Commonly known as: NORVASC Take 5 mg by mouth daily.   carvedilol 25 MG tablet Commonly known as: COREG Take 25 mg by mouth 2 (two) times daily with a meal.   cyclobenzaprine 10 MG tablet Commonly known as: FLEXERIL Take 10 mg by mouth 3 (three) times daily as needed for muscle spasms.   glipiZIDE 5 MG tablet Commonly known as: GLUCOTROL Take 5 mg by mouth daily. 30 minutes prior to a meal   insulin glargine 100 UNIT/ML Solostar Pen Commonly known as: LANTUS Inject 40 Units into the skin at bedtime.   nicotine 21 mg/24hr patch Commonly known as: NICODERM CQ - dosed in mg/24 hours Place 1 patch (21 mg total) onto the skin daily.   omeprazole 20 MG capsule Commonly known as: PRILOSEC Take 20 mg by mouth daily. What changed: Another medication with the same name was removed.  Continue taking this medication, and follow the directions you see here.   ondansetron 4 MG tablet Commonly known as: ZOFRAN Take 1 tablet (4 mg total) by mouth every 8 (eight) hours as needed for nausea or vomiting.   oxyCODONE 5 MG immediate release tablet Commonly known as: Oxy IR/ROXICODONE Take 2 tablets (10 mg total) by mouth every 4 (four) hours as needed for moderate pain.   sildenafil 100 MG tablet Commonly known as: VIAGRA Take 50 mg by mouth daily as needed for erectile dysfunction.   simvastatin 80 MG tablet Commonly known as: ZOCOR Take 80 mg by mouth at bedtime.      No Known Allergies    The results of significant diagnostics from this hospitalization (including imaging, microbiology, ancillary and laboratory) are listed below for reference.    Significant Diagnostic Studies: US Abdomen Complete  Result Date: 04/04/2020 CLINICAL DATA:  Acute pancreatitis abdomen pain EXAM: ABDOMEN ULTRASOUND COMPLETE COMPARISON:  CT 04/04/2020 FINDINGS: Gallbladder: No shadowing stones. Multiple gallbladder polyps measuring up to 9.6 mm. Normal wall thickness. Negative sonographic Murphy. Common bile duct: Diameter: 5.1 mm Liver: Heterogeneous echotexture with areas of increased and decreased echogenicity, likely reflecting heterogenous steatosis. Portal vein is patent on color Doppler imaging with normal direction of blood flow towards the liver. IVC: No abnormality visualized. Pancreas: Hypoechoic without focal fluid collection. Spleen: Size and appearance within normal limits. Right Kidney: Length: 13.1 cm. Echogenicity within normal limits. No mass or hydronephrosis visualized. Left Kidney: Length: 13 cm. Echogenicity within normal limits. No mass or hydronephrosis visualized. Abdominal aorta: No aneurysm visualized. Other findings: Trace perihepatic and perisplenic fluid. IMPRESSION: 1. Multiple gallbladder polyps measuring up to 9.6 mm in size but without sonographic findings for  acute gallbladder disease. Consider surgical consultation for the gallbladder polyps which are protein 1 cm in size 2. Heterogenous steatosis of the liver 3. Hypoechoic pancreas likely due to pancreatitis 4. Trace perihepatic and perisplenic fluid Electronically Signed   By: Donavan Foil M.D.   On: 04/04/2020 19:15   CT Abdomen Pelvis W Contrast  Result Date: 04/04/2020 CLINICAL DATA:  Nausea and vomiting, lower abdominal pain EXAM: CT ABDOMEN AND PELVIS WITH CONTRAST TECHNIQUE: Multidetector CT imaging of the abdomen and pelvis was performed using the standard protocol following bolus administration of intravenous contrast. CONTRAST:  141mL OMNIPAQUE IOHEXOL 300 MG/ML  SOLN COMPARISON:  01/05/2013 FINDINGS: Lower chest: No acute pleural or parenchymal lung disease. Hepatobiliary: Diffuse hepatic steatosis. No focal liver abnormalities. The gallbladder is unremarkable. Pancreas: Inflammatory changes are seen surrounding the pancreas, consistent with acute pancreatitis. Parenchyma enhances normally. No fluid collection, pseudocyst, or abscess. Spleen: Normal in size without focal abnormality. Adrenals/Urinary Tract: Kidneys enhance normally. Small cortical cysts are unchanged. Adrenals are unremarkable. Bladder is grossly normal. Stomach/Bowel: No bowel obstruction or ileus. Normal appendix right lower quadrant. No bowel wall thickening or inflammatory change. Vascular/Lymphatic: Aortic atherosclerosis. Vascular structures are patent. No enlarged abdominal or pelvic lymph nodes. Reproductive: Prostate is unremarkable. Other: Small amount of free fluid within the lower abdomen and pelvis. No free intra-abdominal gas. There is a small umbilical hernia containing a portion of small bowel. No obstruction, strangulation, or incarceration. Musculoskeletal: No acute or destructive bony lesions. Reconstructed images demonstrate no additional findings. IMPRESSION: 1. Acute uncomplicated pancreatitis, with diffuse  inflammatory changes surrounding the entirety of the pancreatic parenchyma. No pseudocyst, fluid collection, or abscess. 2. Hepatic steatosis. 3. Small umbilical hernia containing a portion of small bowel. 4. Trace ascites. 5.  Aortic Atherosclerosis (ICD10-I70.0). Electronically Signed   By: Randa Ngo M.D.   On: 04/04/2020 16:50   MR 3D Recon At Scanner  Result Date: 04/05/2020 CLINICAL DATA:  Suspected pancreatitis EXAM: MRI ABDOMEN WITHOUT AND WITH CONTRAST (INCLUDING MRCP) TECHNIQUE: Multiplanar multisequence MR imaging of the abdomen was performed both before and after the administration of intravenous contrast. Heavily T2-weighted images of the biliary and pancreatic ducts were obtained, and three-dimensional MRCP images were rendered by post processing. CONTRAST:  7.43mL GADAVIST GADOBUTROL 1 MMOL/ML IV SOLN COMPARISON:  Ultrasound abdomen 04/04/2020 and CT 04/04/2020 FINDINGS: Lower chest: Incidental imaging of the lung bases is unremarkable. No consolidation. No pleural effusion. No pericardial fluid. Hepatobiliary: Moderate to marked hepatic steatosis. No pericholecystic stranding. No biliary ductal dilation or filling defect. No suspicious focal hepatic lesion though assessment limited by respiratory motion. Portal vein is patent. Splenic vein is patent. Pancreas: Mild peripancreatic edema. No focal peripancreatic fluid or significant stranding or fluid in the  anterior pararenal space. Spleen:  Spleen normal size.  No focal lesion. Adrenals/Urinary Tract: Adrenal glands are normal. Cysts in the bilateral kidneys. No hydronephrosis. Stomach/Bowel: Normal to the extent evaluated. Motion limits assessment on today's study and study is not protocol for bowel evaluation. Vascular/Lymphatic: Vascular structures in the abdomen are patent. No aneurysmal dilation of the abdominal aorta. Other:  Trace ascites. Musculoskeletal: No acute musculoskeletal process. IMPRESSION: 1. Motion limited examination. 2.  Mild interstitial edematous pancreatitis similar to slightly improved when compared to the previous study. No focal fluid collection. No sign of pancreatic necrosis 3. No sign of pancreatic divisum 4. No biliary ductal dilation or choledocholithiasis. 5. Moderate to marked hepatic steatosis. 6. Trace ascites. Electronically Signed   By: Zetta Bills M.D.   On: 04/05/2020 16:56   MR ABDOMEN MRCP W WO CONTAST  Result Date: 04/05/2020 CLINICAL DATA:  Suspected pancreatitis EXAM: MRI ABDOMEN WITHOUT AND WITH CONTRAST (INCLUDING MRCP) TECHNIQUE: Multiplanar multisequence MR imaging of the abdomen was performed both before and after the administration of intravenous contrast. Heavily T2-weighted images of the biliary and pancreatic ducts were obtained, and three-dimensional MRCP images were rendered by post processing. CONTRAST:  7.28mL GADAVIST GADOBUTROL 1 MMOL/ML IV SOLN COMPARISON:  Ultrasound abdomen 04/04/2020 and CT 04/04/2020 FINDINGS: Lower chest: Incidental imaging of the lung bases is unremarkable. No consolidation. No pleural effusion. No pericardial fluid. Hepatobiliary: Moderate to marked hepatic steatosis. No pericholecystic stranding. No biliary ductal dilation or filling defect. No suspicious focal hepatic lesion though assessment limited by respiratory motion. Portal vein is patent. Splenic vein is patent. Pancreas: Mild peripancreatic edema. No focal peripancreatic fluid or significant stranding or fluid in the anterior pararenal space. Spleen:  Spleen normal size.  No focal lesion. Adrenals/Urinary Tract: Adrenal glands are normal. Cysts in the bilateral kidneys. No hydronephrosis. Stomach/Bowel: Normal to the extent evaluated. Motion limits assessment on today's study and study is not protocol for bowel evaluation. Vascular/Lymphatic: Vascular structures in the abdomen are patent. No aneurysmal dilation of the abdominal aorta. Other:  Trace ascites. Musculoskeletal: No acute musculoskeletal  process. IMPRESSION: 1. Motion limited examination. 2. Mild interstitial edematous pancreatitis similar to slightly improved when compared to the previous study. No focal fluid collection. No sign of pancreatic necrosis 3. No sign of pancreatic divisum 4. No biliary ductal dilation or choledocholithiasis. 5. Moderate to marked hepatic steatosis. 6. Trace ascites. Electronically Signed   By: Zetta Bills M.D.   On: 04/05/2020 16:56    Microbiology: Recent Results (from the past 240 hour(s))  SARS Coronavirus 2 by RT PCR (hospital order, performed in [facility] hospital lab) Nasopharyngeal Nasopharyngeal Swab     Status: None   Collection Time: 04/04/20  3:39 PM   Specimen: Nasopharyngeal Swab  Result Value Ref Range Status   SARS Coronavirus 2 NEGATIVE NEGATIVE Final    Comment: (NOTE) SARS-CoV-2 target nucleic acids are NOT DETECTED.  The SARS-CoV-2 RNA is generally detectable in upper and lower respiratory specimens during the acute phase of infection. The lowest concentration of SARS-CoV-2 viral copies this assay can detect is 250 copies / mL. A negative result does not preclude SARS-CoV-2 infection and should not be used as the sole basis for treatment or other patient management decisions.  A negative result may occur with improper specimen collection / handling, submission of specimen other than nasopharyngeal swab, presence of viral mutation(s) within the areas targeted by this assay, and inadequate number of viral copies (<250 copies / mL). A negative result must be combined  with clinical observations, patient history, and epidemiological information.  Fact Sheet for Patients:   StrictlyIdeas.no  Fact Sheet for Healthcare Providers: BankingDealers.co.za  This test is not yet approved or  cleared by the [place] FDA and has been authorized for detection and/or diagnosis of SARS-CoV-2 by FDA under an Emergency Use  Authorization (EUA).  This EUA will remain in effect (meaning this test can be used) for the duration of the COVID-19 declaration under Section 564(b)(1) of the Act, 21 U.S.C. section 360bbb-3(b)(1), unless the authorization is terminated or revoked sooner.  Performed at [facility], [place] [address], [place], [place] [address]      Labs: Basic Metabolic Panel: Recent Labs  Lab 04/04/20 1311 04/04/20 1900 04/05/20 0440 04/06/20 0445 04/07/20 0507  NA 127*  --  130* 127*  --   K 3.8  --  3.4* 2.8*  --   CL 90*  --  97* 90*  --   CO2 18*  --  23 27  --   GLUCOSE 286*  --  228* 200*  --   BUN 24*  --  12 8  --   CREATININE 1.31* 0.78 0.75 0.56*  --   CALCIUM 10.3  --  8.8* 9.3  --   MG  --   --   --  1.6* 1.5*   Liver Function Tests: Recent Labs  Lab 04/04/20 1311 04/05/20 0440 04/06/20 0445  AST 21 16 22   ALT 23 15 18   ALKPHOS 67 53 54  BILITOT 1.7* 1.0 0.8  PROT 9.4* 7.0 7.4  ALBUMIN 4.8 3.6 3.5   Recent Labs  Lab 04/04/20 1311  LIPASE 373*   No results for input(s): AMMONIA in the last 168 hours. CBC: Recent Labs  Lab 04/04/20 1311 04/04/20 1900 04/05/20 0440 04/07/20 0507  WBC 8.7 7.3 7.0 5.9  NEUTROABS  --   --  4.9 3.5  HGB 16.0 13.9 12.7* 13.6  HCT 45.8 40.0 36.5* 37.9*  MCV 93.9 95.0 93.4 92.0  PLT 218 179 152 179   Cardiac Enzymes: No results for input(s): CKTOTAL, CKMB, CKMBINDEX, TROPONINI in the last 168 hours. BNP: BNP (last 3 results) No results for input(s): BNP in the last 8760 hours.  ProBNP (last 3 results) No results for input(s): PROBNP in the last 8760 hours.  CBG: Recent Labs  Lab 04/06/20 0734 04/06/20 1109 04/06/20 1733 04/06/20 2144 04/07/20 0523  GLUCAP 204* 195* 136* 167* 132*       Signed:  Nita Sells MD   [facility] 04/07/2020, 10:33 AM

## 2020-04-07 NOTE — Progress Notes (Signed)
Pt left AMA--Against Medical Advice, form signed by patient, patient acknowledged understanding of AMA procedures. Pt initially told probable discharged based on labs that need collecting and based if the lab values returned within normal limits. MD spoke with patient and explain the discharge condition and pt acknowledged understanding. However, pt stated he has important personal situation at home he must handle that is time sensitive. Pt labs resulted, abnormal values noted;  MD called to speak with patient to report the updated labs and advised  patient he is not ready for discharge and that he need to be treated for low NA. Pt refused to stay and told MD and writer he is leaving. Lasix  administered prior to discharge and education on the purpose explained and a urine speciment obtained, the reason the specimen was reviewed with patient. Pt acknowledged understanding. AMA forms signed; pt ambulated out. Pt was [facility].  No additional information given to patient. Discharged canceled in computer per MD request. SRP, RN

## 2021-01-03 ENCOUNTER — Encounter (HOSPITAL_COMMUNITY): Payer: Self-pay

## 2021-01-03 ENCOUNTER — Other Ambulatory Visit: Payer: Self-pay

## 2021-01-03 DIAGNOSIS — I2510 Atherosclerotic heart disease of native coronary artery without angina pectoris: Secondary | ICD-10-CM | POA: Insufficient documentation

## 2021-01-03 DIAGNOSIS — R109 Unspecified abdominal pain: Secondary | ICD-10-CM | POA: Diagnosis present

## 2021-01-03 DIAGNOSIS — I10 Essential (primary) hypertension: Secondary | ICD-10-CM | POA: Diagnosis not present

## 2021-01-03 DIAGNOSIS — K852 Alcohol induced acute pancreatitis without necrosis or infection: Secondary | ICD-10-CM | POA: Diagnosis not present

## 2021-01-03 DIAGNOSIS — E119 Type 2 diabetes mellitus without complications: Secondary | ICD-10-CM | POA: Diagnosis not present

## 2021-01-03 DIAGNOSIS — Z79899 Other long term (current) drug therapy: Secondary | ICD-10-CM | POA: Diagnosis not present

## 2021-01-03 DIAGNOSIS — Z794 Long term (current) use of insulin: Secondary | ICD-10-CM | POA: Insufficient documentation

## 2021-01-03 DIAGNOSIS — Z7982 Long term (current) use of aspirin: Secondary | ICD-10-CM | POA: Diagnosis not present

## 2021-01-03 DIAGNOSIS — F17210 Nicotine dependence, cigarettes, uncomplicated: Secondary | ICD-10-CM | POA: Insufficient documentation

## 2021-01-03 LAB — COMPREHENSIVE METABOLIC PANEL
ALT: 30 U/L (ref 0–44)
AST: 41 U/L (ref 15–41)
Albumin: 4.2 g/dL (ref 3.5–5.0)
Alkaline Phosphatase: 64 U/L (ref 38–126)
Anion gap: 15 (ref 5–15)
BUN: 11 mg/dL (ref 6–20)
CO2: 23 mmol/L (ref 22–32)
Calcium: 9.8 mg/dL (ref 8.9–10.3)
Chloride: 93 mmol/L — ABNORMAL LOW (ref 98–111)
Creatinine, Ser: 0.76 mg/dL (ref 0.61–1.24)
GFR, Estimated: >60 mL/min (ref >60)
Glucose, Bld: 68 mg/dL — ABNORMAL LOW (ref 70–99)
Potassium: 3.6 mmol/L (ref 3.5–5.1)
Sodium: 131 mmol/L — ABNORMAL LOW (ref 135–145)
Total Bilirubin: 0.7 mg/dL (ref 0.3–1.2)
Total Protein: 8.4 g/dL — ABNORMAL HIGH (ref 6.5–8.1)

## 2021-01-03 LAB — CBC
HCT: 37.3 % — ABNORMAL LOW (ref 39.0–52.0)
Hemoglobin: 12.9 g/dL — ABNORMAL LOW (ref 13.0–17.0)
MCH: 33.3 pg (ref 26.0–34.0)
MCHC: 34.6 g/dL (ref 30.0–36.0)
MCV: 96.4 fL (ref 80.0–100.0)
Platelets: 307 10*3/uL (ref 150–400)
RBC: 3.87 MIL/uL — ABNORMAL LOW (ref 4.22–5.81)
RDW: 12.4 % (ref 11.5–15.5)
WBC: 6.7 10*3/uL (ref 4.0–10.5)
nRBC: 0 % (ref 0.0–0.2)

## 2021-01-03 LAB — LIPASE, BLOOD: Lipase: 81 U/L — ABNORMAL HIGH (ref 11–51)

## 2021-01-03 NOTE — ED Triage Notes (Signed)
Pt c/o abdominal pain and distention for a month. No bowel movement for 2 weeks.

## 2021-01-04 ENCOUNTER — Encounter (HOSPITAL_COMMUNITY): Payer: Self-pay

## 2021-01-04 ENCOUNTER — Emergency Department (HOSPITAL_COMMUNITY)
Admission: EM | Admit: 2021-01-04 | Discharge: 2021-01-04 | Disposition: A | Discharge status: Home / Self Care | Payer: No Typology Code available for payment source | Attending: Emergency Medicine | Admitting: Emergency Medicine

## 2021-01-04 ENCOUNTER — Emergency Department (HOSPITAL_COMMUNITY): Payer: No Typology Code available for payment source

## 2021-01-04 DIAGNOSIS — K852 Alcohol induced acute pancreatitis without necrosis or infection: Secondary | ICD-10-CM

## 2021-01-04 LAB — URINALYSIS, ROUTINE W REFLEX MICROSCOPIC
Bacteria, UA: NONE SEEN
Bilirubin Urine: NEGATIVE
Glucose, UA: 150 mg/dL — AB
Hgb urine dipstick: NEGATIVE
Ketones, ur: 20 mg/dL — AB
Leukocytes,Ua: NEGATIVE
Nitrite: NEGATIVE
Protein, ur: NEGATIVE mg/dL
Specific Gravity, Urine: 1.017 (ref 1.005–1.030)
pH: 5 (ref 5.0–8.0)

## 2021-01-04 LAB — RAPID URINE DRUG SCREEN, HOSP PERFORMED
Amphetamines: NOT DETECTED
Barbiturates: NOT DETECTED
Benzodiazepines: NOT DETECTED
Cocaine: NOT DETECTED
Opiates: POSITIVE — AB
Tetrahydrocannabinol: NOT DETECTED

## 2021-01-04 LAB — ETHANOL: Alcohol, Ethyl (B): 53 mg/dL — ABNORMAL HIGH (ref <10)

## 2021-01-04 LAB — CBG MONITORING, ED
Glucose-Capillary: 40 mg/dL — CL (ref 70–99)
Glucose-Capillary: 183 mg/dL — ABNORMAL HIGH (ref 70–99)
Glucose-Capillary: 130 mg/dL — ABNORMAL HIGH (ref 70–99)

## 2021-01-04 MED ORDER — IOHEXOL 300 MG/ML  SOLN
100.0000 mL | Freq: Once | INTRAMUSCULAR | Status: AC | PRN
  Administered 2021-01-04: 100 mL via INTRAVENOUS

## 2021-01-04 MED ORDER — DEXTROSE 50 % IV SOLN
INTRAVENOUS | Status: AC
  Administered 2021-01-04: 50 mL
  Filled 2021-01-04: qty 50

## 2021-01-04 MED ORDER — OXYCODONE-ACETAMINOPHEN 5-325 MG PO TABS: 1.0000 | ORAL_TABLET | ORAL | 0 refills | Status: DC | PRN

## 2021-01-04 MED ORDER — ONDANSETRON HCL 4 MG/2ML IJ SOLN
4.0000 mg | Freq: Once | INTRAMUSCULAR | Status: AC
  Administered 2021-01-04: 4 mg via INTRAVENOUS
  Filled 2021-01-04: qty 2

## 2021-01-04 MED ORDER — ONDANSETRON 8 MG PO TBDP
8.0000 mg | ORAL_TABLET | Freq: Three times a day (TID) | ORAL | 0 refills | Status: DC | PRN
Start: 2021-01-04 — End: 2022-06-24

## 2021-01-04 MED ORDER — DEXTROSE-NACL 5-0.9 % IV SOLN: Freq: Once | INTRAVENOUS | Status: AC

## 2021-01-04 NOTE — ED Notes (Signed)
V/o from Dr. Florina Ou to d/c fluids and PO challenge pt. Pt eating crackers and sandwich now

## 2021-01-04 NOTE — ED Notes (Signed)
Pt requesting prescription for tylenol or other pain medication to go home with for abdominal pain. Dr. Florina Ou notified

## 2021-01-04 NOTE — ED Notes (Signed)
Dr. Florina Ou notified of pt BP. No new orders obtained

## 2021-01-04 NOTE — ED Provider Notes (Signed)
[place] [facility] DEPT Provider Note: Georgena Spurling, MD, FACEP  CSN: [id number] MRN: [id number] ARRIVAL: 01/03/21 at 2113 ROOM: AL93/XT02   CHIEF COMPLAINT  Abdominal Pain   HISTORY OF PRESENT ILLNESS  01/04/21 12:30 AM [name] is a 55 y.o. male who is been constipated for about 3 weeks.  He states he has not had a bowel movement during that time.  He is having associated abdominal pain which is dull and he rates it as a 6 out of 10.  It is somewhat worse with movement or palpation.  He has been taking magnesium citrate without relief of his constipation.  He feels like his abdomen is distended.  He has not been vomiting.  He has had no urinary symptoms.  He denies narcotic use.   Past Medical History:  Diagnosis Date  . Acid reflux   . Concussion   . Coronary artery disease   . Diabetes ([place])   . ETOH abuse   . High cholesterol   . Hypertension   . MI (myocardial infarction) ([place])    2012  . Sleep apnea    USES CPAP AS NEEDED  . Tobacco abuse     Past Surgical History:  Procedure Laterality Date  . KNEE ARTHROSCOPY    . ORIF ULNAR FRACTURE Right 12/31/2016   Procedure: OPEN REDUCTION INTERNAL FIXATION (ORIF) both bone forearm fracture;  Surgeon: Roseanne Kaufman, MD;  Location: [facility];  Service: Orthopedics;  Laterality: Right;  . stents      Family History  Problem Relation Age of Onset  . Diabetes Mother     Social History   Tobacco Use  . Smoking status: Current Every Day Smoker    Packs/day: 0.25    Years: 30.00    Pack years: 7.50    Types: Cigarettes  . Smokeless tobacco: Never Used  Vaping Use  . Vaping Use: Never used  Substance Use Topics  . Alcohol use: Yes    Alcohol/week: 6.0 standard drinks    Types: 6 Cans of beer per week    Comment: occassionally on weekends  . Drug use: No    Prior to Admission medications   Medication Sig Start Date End Date Taking? Authorizing Provider  aspirin 325 MG tablet Take 325 mg by mouth daily.    Yes [provider]  carvedilol (COREG) 25 MG tablet Take 25 mg by mouth 2 (two) times daily with a meal.   Yes [provider]  glipiZIDE (GLUCOTROL) 5 MG tablet Take 5 mg by mouth daily. 30 minutes prior to a meal   Yes [provider]  insulin glargine (LANTUS) 100 UNIT/ML Solostar Pen Inject 50 Units into the skin at bedtime.   Yes [provider]  lisinopril (ZESTRIL) 40 MG tablet Take 40 mg by mouth daily.   Yes [provider]  ondansetron (ZOFRAN ODT) 8 MG disintegrating tablet Take 1 tablet (8 mg total) by mouth every 8 (eight) hours as needed for nausea or vomiting. 01/04/21  Yes Jamera Vanloan, MD  oxyCODONE-acetaminophen (PERCOCET) 5-325 MG tablet Take 1 tablet by mouth every 4 (four) hours as needed for severe pain. 01/04/21  Yes Rosella Crandell, MD  simvastatin (ZOCOR) 80 MG tablet Take 80 mg by mouth at bedtime.   Yes [provider]  Vitamin D, Ergocalciferol, (DRISDOL) 1.25 MG (50000 UNIT) CAPS capsule Take 50,000 Units by mouth every 7 (seven) days.   Yes [provider]  sildenafil (VIAGRA) 100 MG tablet Take 50 mg  by mouth daily as needed for erectile dysfunction.    [provider]    Allergies Patient has no known allergies.   REVIEW OF SYSTEMS  Negative except as noted here or in the History of Present Illness.   PHYSICAL EXAMINATION  Initial Vital Signs Blood pressure (!) 155/108, pulse 88, temperature 98.4 F (36.9 C), temperature source Oral, resp. rate 18, height 5\' 11"  (1.803 m), weight 65.8 kg, SpO2 98 %.  Examination General: Well-developed, well-nourished male in no acute distress; appearance consistent with age of record HENT: normocephalic; atraumatic Eyes: pupils equal, round and reactive to light; extraocular muscles intact Neck: supple Heart: regular rate and rhythm Lungs: clear to auscultation bilaterally Abdomen: soft; nondistended; epigastric tenderness; no masses or  hepatosplenomegaly; bowel sounds present Extremities: No deformity; full range of motion; pulses normal Neurologic: Awake, alert and oriented; motor function intact in all extremities and symmetric; no facial droop Skin: Warm and dry Psychiatric: Normal mood and affect   RESULTS  Summary of this visit's results, reviewed and interpreted by myself:   EKG Interpretation  Date/Time:    Ventricular Rate:    PR Interval:    QRS Duration:   QT Interval:    QTC Calculation:   R Axis:     Text Interpretation:        Laboratory Studies: Results for orders placed or performed during the hospital encounter of 01/04/21 (from the past 24 hour(s))  Lipase, blood     Status: Abnormal   Collection Time: 01/03/21  9:33 PM  Result Value Ref Range   Lipase 81 (H) 11 - 51 U/L  Comprehensive metabolic panel     Status: Abnormal   Collection Time: 01/03/21  9:33 PM  Result Value Ref Range   Sodium 131 (L) 135 - 145 mmol/L   Potassium 3.6 3.5 - 5.1 mmol/L   Chloride 93 (L) 98 - 111 mmol/L   CO2 23 22 - 32 mmol/L   Glucose, Bld 68 (L) 70 - 99 mg/dL   BUN 11 6 - 20 mg/dL   Creatinine, Ser 0.76 0.61 - 1.24 mg/dL   Calcium 9.8 8.9 - 10.3 mg/dL   Total Protein 8.4 (H) 6.5 - 8.1 g/dL   Albumin 4.2 3.5 - 5.0 g/dL   AST 41 15 - 41 U/L   ALT 30 0 - 44 U/L   Alkaline Phosphatase 64 38 - 126 U/L   Total Bilirubin 0.7 0.3 - 1.2 mg/dL   GFR, Estimated >60 >60 mL/min   Anion gap 15 5 - 15  CBC     Status: Abnormal   Collection Time: 01/03/21  9:33 PM  Result Value Ref Range   WBC 6.7 4.0 - 10.5 K/uL   RBC 3.87 (L) 4.22 - 5.81 MIL/uL   Hemoglobin 12.9 (L) 13.0 - 17.0 g/dL   HCT 37.3 (L) 39.0 - 52.0 %   MCV 96.4 80.0 - 100.0 fL   MCH 33.3 26.0 - 34.0 pg   MCHC 34.6 30.0 - 36.0 g/dL   RDW 12.4 11.5 - 15.5 %   Platelets 307 150 - 400 K/uL   nRBC 0.0 0.0 - 0.2 %  CBG monitoring, ED     Status: Abnormal   Collection Time: 01/04/21  1:01 AM  Result Value Ref Range   Glucose-Capillary 40 (LL) 70 -  99 mg/dL  Ethanol     Status: Abnormal   Collection Time: 01/04/21  1:38 AM  Result Value Ref Range   Alcohol, Ethyl (B) 53 (  H) <10 mg/dL  CBG monitoring, ED     Status: Abnormal   Collection Time: 01/04/21  1:46 AM  Result Value Ref Range   Glucose-Capillary 183 (H) 70 - 99 mg/dL  Urinalysis, Routine w reflex microscopic Urine, Clean Catch     Status: Abnormal   Collection Time: 01/04/21  3:00 AM  Result Value Ref Range   Color, Urine YELLOW YELLOW   APPearance CLEAR CLEAR   Specific Gravity, Urine 1.017 1.005 - 1.030   pH 5.0 5.0 - 8.0   Glucose, UA 150 (A) NEGATIVE mg/dL   Hgb urine dipstick NEGATIVE NEGATIVE   Bilirubin Urine NEGATIVE NEGATIVE   Ketones, ur 20 (A) NEGATIVE mg/dL   Protein, ur NEGATIVE NEGATIVE mg/dL   Nitrite NEGATIVE NEGATIVE   Leukocytes,Ua NEGATIVE NEGATIVE   RBC / HPF 0-5 0 - 5 RBC/hpf   Bacteria, UA NONE SEEN NONE SEEN   Squamous Epithelial / LPF 0-5 0 - 5   Mucus PRESENT   Rapid urine drug screen (hospital performed)     Status: Abnormal   Collection Time: 01/04/21  3:00 AM  Result Value Ref Range   Opiates POSITIVE (A) NONE DETECTED   Cocaine NONE DETECTED NONE DETECTED   Benzodiazepines NONE DETECTED NONE DETECTED   Amphetamines NONE DETECTED NONE DETECTED   Tetrahydrocannabinol NONE DETECTED NONE DETECTED   Barbiturates NONE DETECTED NONE DETECTED  CBG monitoring, ED     Status: Abnormal   Collection Time: 01/04/21  4:17 AM  Result Value Ref Range   Glucose-Capillary 130 (H) 70 - 99 mg/dL   Imaging Studies: CT ABDOMEN PELVIS W CONTRAST  Result Date: 01/04/2021 CLINICAL DATA:  Epigastric pain. EXAM: CT ABDOMEN AND PELVIS WITH CONTRAST TECHNIQUE: Multidetector CT imaging of the abdomen and pelvis was performed using the standard protocol following bolus administration of intravenous contrast. CONTRAST:  148mL OMNIPAQUE IOHEXOL 300 MG/ML  SOLN COMPARISON:  MRI abdomen 04/05/2020, CT abdomen pelvis 04/04/2020 FINDINGS: Lower chest: Coronary artery  calcifications.  No acute abnormality. Hepatobiliary: The hepatic parenchyma is diffusely hypodense compared to the splenic parenchyma consistent with fatty infiltration. No focal liver abnormality. No gallstones, gallbladder wall thickening, or pericholecystic fluid. No biliary dilatation. Pancreas: No focal lesion. Edematous appearing uncinate process with associated fat stranding (2:37). No main pancreatic ductal dilatation. No pseudocyst formation. Spleen: Normal in size without focal abnormality. Adrenals/Urinary Tract: No adrenal nodule bilaterally. Bilateral kidneys enhance symmetrically. Subcentimeter hypodensities are too small to characterize. No hydronephrosis. No hydroureter. The urinary bladder is unremarkable. On delayed imaging, there is no urothelial wall thickening and there are no filling defects in the opacified portions of the bilateral collecting systems or ureters. Stomach/Bowel: Stomach is within normal limits. No evidence of bowel wall thickening or dilatation. Few scattered colonic diverticula. Appendix appears normal. Vascular/Lymphatic: No abdominal aorta or iliac aneurysm. Mild to moderate atherosclerotic plaque of the aorta and its branches. No abdominal, pelvic, or inguinal lymphadenopathy. Reproductive: Prostate is unremarkable. Other: No intraperitoneal free fluid. No intraperitoneal free gas. No organized fluid collection. Musculoskeletal: Tiny fat containing umbilical hernia. No suspicious lytic or blastic osseous lesions. No acute displaced fracture. Multilevel degenerative changes of the spine. IMPRESSION: 1. Suggestion of proximal acute pancreatitis. No pseudocyst formation. Correlate with lipase levels. 2. Scattered colonic diverticulosis with no acute diverticulitis. 3. Aortic Atherosclerosis (ICD10-I70.0), including coronary artery calcifications. Electronically Signed   By: Iven Finn M.D.   On: 01/04/2021 02:47    ED COURSE and MDM  Nursing notes, initial and  subsequent vitals signs,  including pulse oximetry, reviewed and interpreted by myself.  Vitals:   01/04/21 0255 01/04/21 0300 01/04/21 0330 01/04/21 0400  BP: (!) 192/122 (!) 181/130 (!) 197/116 (!) 193/97  Pulse: 90 74 73 80  Resp: 17 16 16 16   Temp:      TempSrc:      SpO2: 100% 100% 100% 100%  Weight:      Height:       Medications  ondansetron (ZOFRAN) injection 4 mg (has no administration in time range)  ondansetron (ZOFRAN) injection 4 mg (4 mg Intravenous Given 01/04/21 0123)  dextrose 50 % solution (50 mLs  Given 01/04/21 0121)  dextrose 5 %-0.9 % sodium chloride infusion ( Intravenous Stopped 01/04/21 0310)  iohexol (OMNIPAQUE) 300 MG/ML solution 100 mL (100 mLs Intravenous Contrast Given 01/04/21 0216)    1:13 AM Patient's blood sugar is 40.  Patient acknowledges he has not eaten today due to lack of appetite.  He was given 1 amp of D50 and we will start a dextrose infusion.  3:05 AM Patient admits to drinking alcohol yesterday which I believe is responsible for his pancreatitis flare.  He has a documented history of pancreatitis but tells me he has never been told about this in the past.  He is lipase is only mildly elevated and his CT findings are mild and I do not believe admission to the hospital is necessary at this time.  He was advised that he does need to avoid alcohol and he needs to have an intake of calories to keep his sugar from dropping.  He states he did not take any of his antihypertensives yesterday which is why his blood pressure is elevated.  He states he will follow-up with the [place], where he was seen 12/27/2020 for a checkup.    4:22 AM Patient sugar 130 after being off of parenteral glucose for over an hour.  Of note the patient was unable to hold down crackers he would prefer to go home at this time.  He is not wish to be admitted to the hospital  PROCEDURES  Procedures   ED DIAGNOSES     ICD-10-CM   1. Alcohol-induced acute pancreatitis without  infection or necrosis  K85.20        Dorsel Flinn, Jenny Reichmann, MD 01/04/21 [phone]

## 2021-01-04 NOTE — ED Notes (Signed)
Pt threw up after eating saltines

## 2021-08-29 IMAGING — CT CT ABD-PELV W/ CM
2 of 5 series · 15 of 46 positions shown, 17 images · IV contrast (OMNIPAQUE 300)
Comparison: MRI abdomen 04/05/2020, CT abdomen pelvis 04/04/2020

CLINICAL DATA: Epigastric pain.

EXAM:
CT ABDOMEN AND PELVIS WITH CONTRAST
TECHNIQUE: Multidetector CT imaging of the abdomen and pelvis was performed
using the standard protocol following bolus administration of
intravenous contrast.
CONTRAST:  100mL OMNIPAQUE IOHEXOL 300 MG/ML  SOLN

[Series 2: axial st · axial · 0.68mm/px · z∈[-381,+34]mm · 12 of 95 slices shown, 14 images]
[im 6/95  soft-tissue]
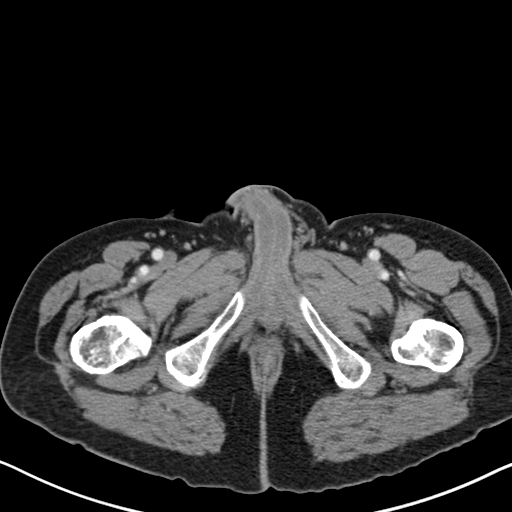
[im 6/95  bone]
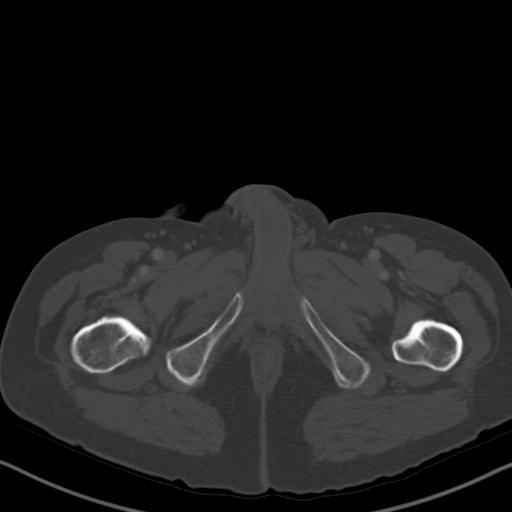
[im 17/95  soft-tissue]
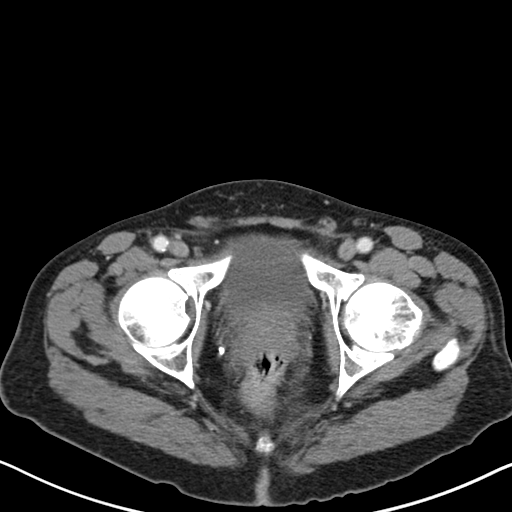
[im 23/95  soft-tissue]
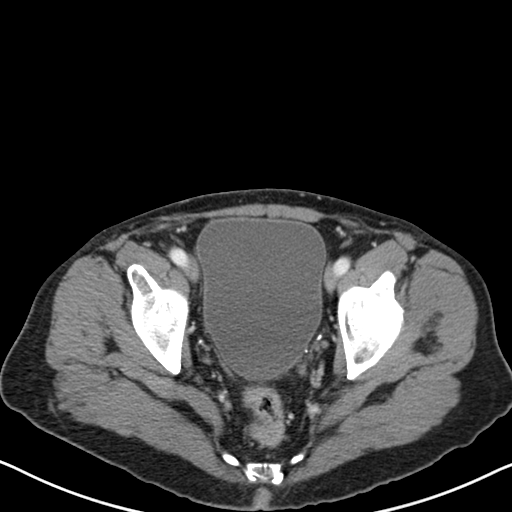
[im 28/95  soft-tissue]
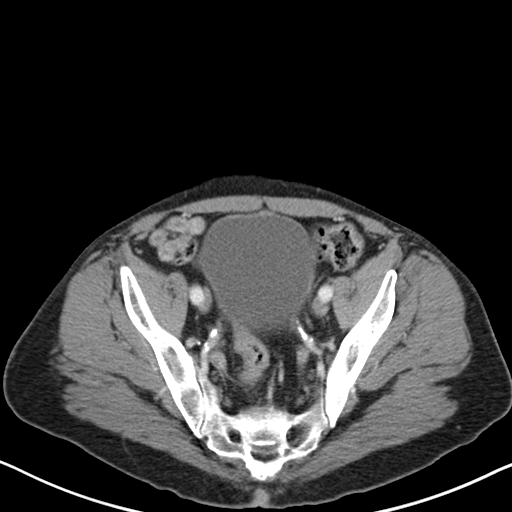
[im 39/95  soft-tissue]
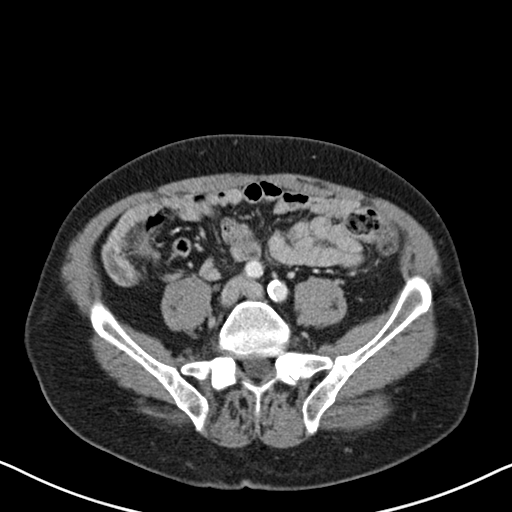
[im 45/95  soft-tissue]
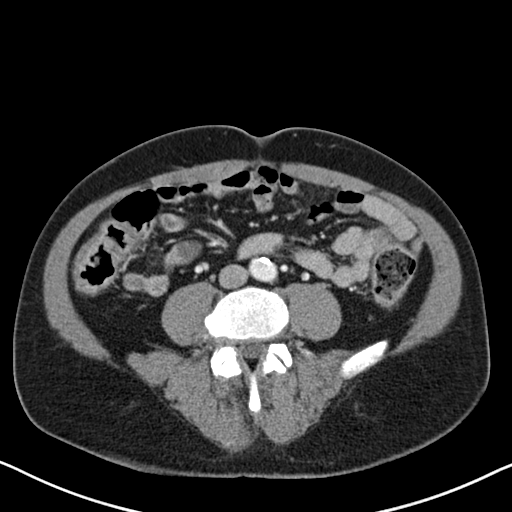
[im 50/95  soft-tissue]
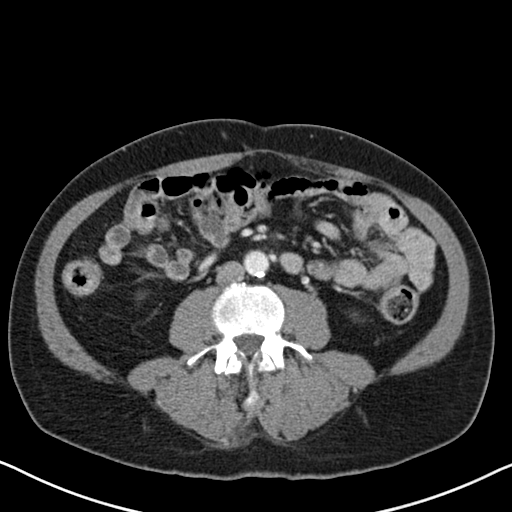
[im 61/95  soft-tissue]
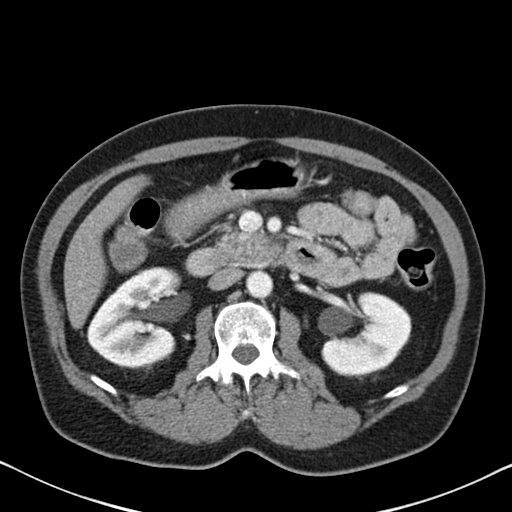
[im 67/95  soft-tissue]
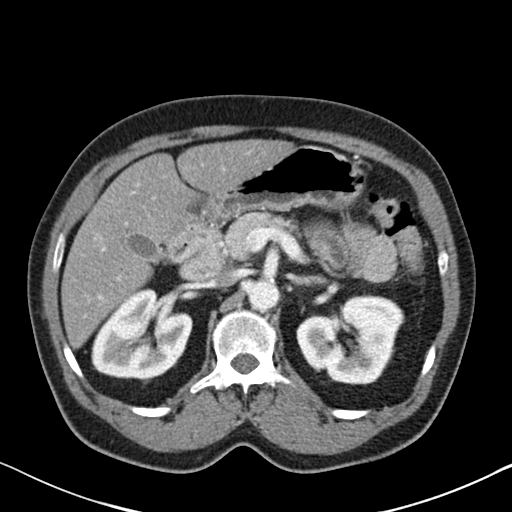
[im 67/95  bone]
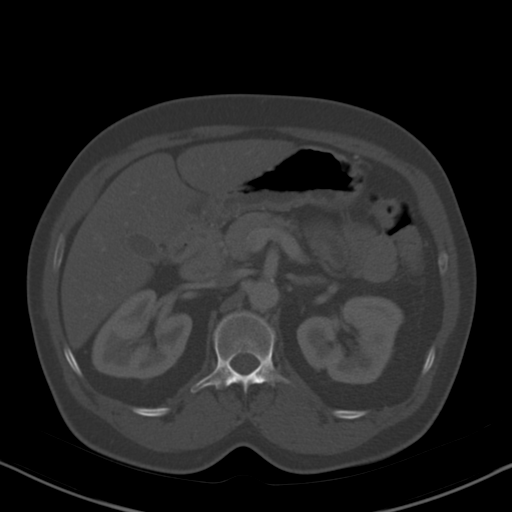
[im 72/95  soft-tissue]
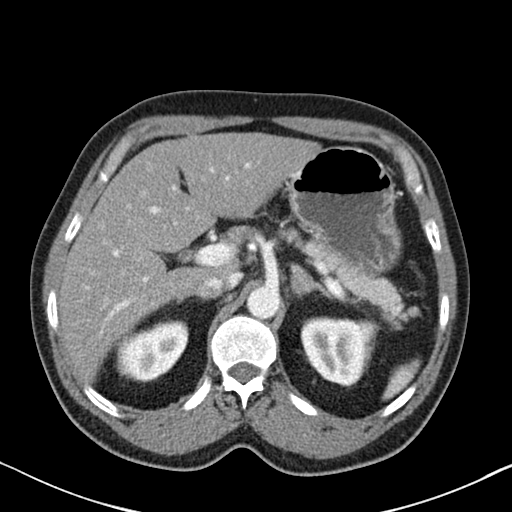
[im 83/95  soft-tissue]
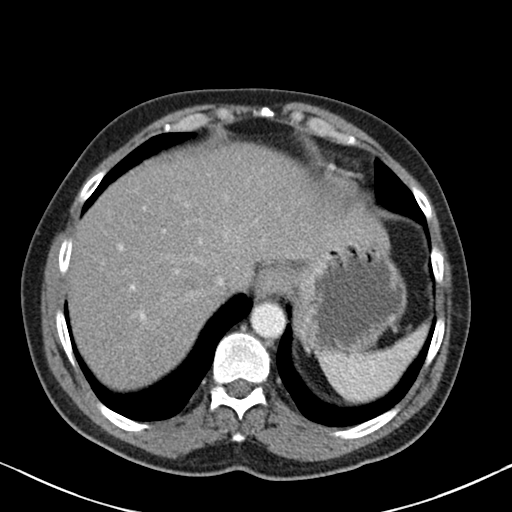
[im 89/95  soft-tissue]
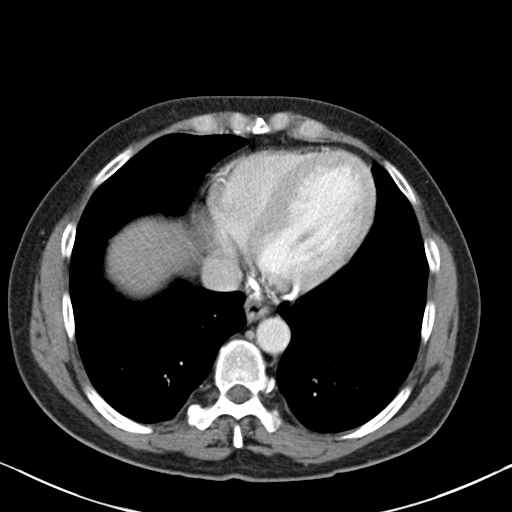

[Series 4: coronal st · coronal · 0.67mm/px · 3 of 136 slices shown]
[im 46/136  soft-tissue]
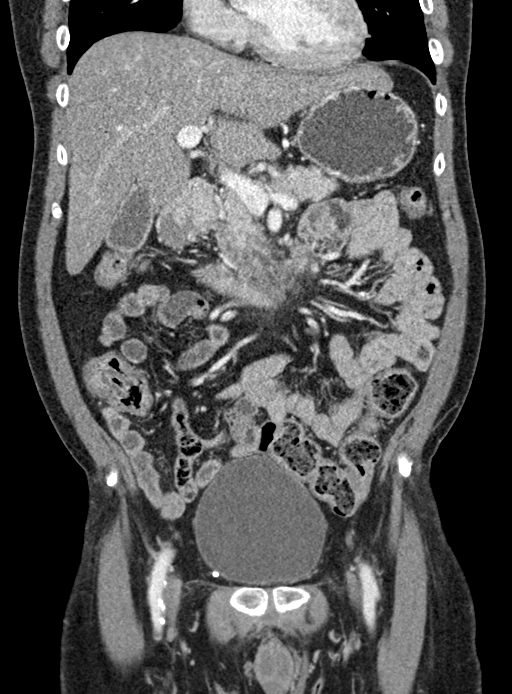
[im 61/136  soft-tissue]
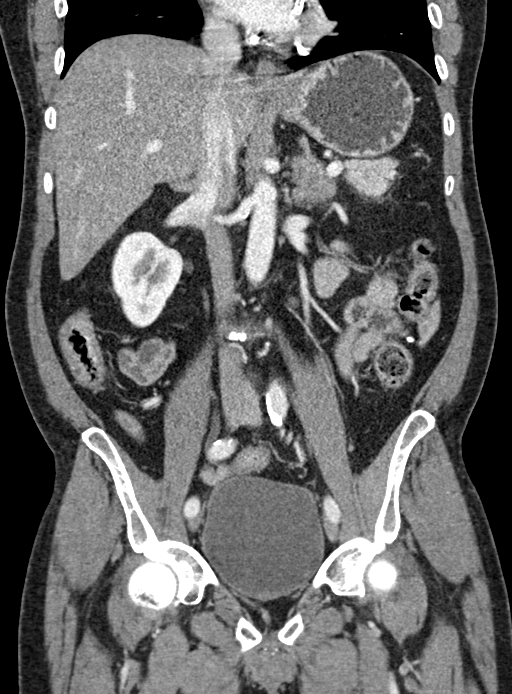
[im 76/136  soft-tissue]
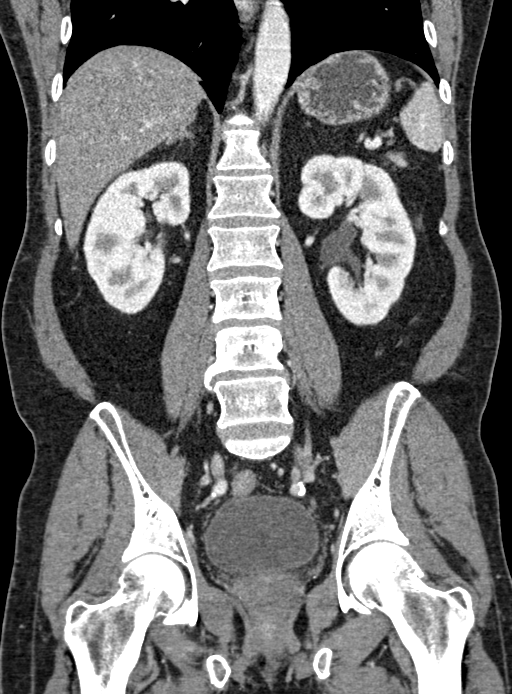

[15 of 46 positions shown; findings below may reference images not displayed]

FINDINGS: Lower chest: Coronary artery calcifications.  No acute abnormality.

Hepatobiliary: The hepatic parenchyma is diffusely hypodense
compared to the splenic parenchyma consistent with fatty
infiltration. No focal liver abnormality. No gallstones, gallbladder
wall thickening, or pericholecystic fluid. No biliary dilatation.

Pancreas: No focal lesion. Edematous appearing uncinate process with
associated fat stranding ([DATE]). No main pancreatic ductal
dilatation. No pseudocyst formation.

Spleen: Normal in size without focal abnormality.

Adrenals/Urinary Tract: No adrenal nodule bilaterally. Bilateral
kidneys enhance symmetrically. Subcentimeter hypodensities are too
small to characterize. No hydronephrosis. No hydroureter. The
urinary bladder is unremarkable. On delayed imaging, there is no
urothelial wall thickening and there are no filling defects in the
opacified portions of the bilateral collecting systems or ureters.

Stomach/Bowel: Stomach is within normal limits. No evidence of bowel
wall thickening or dilatation. Few scattered colonic diverticula.
Appendix appears normal.

Vascular/Lymphatic: No abdominal aorta or iliac aneurysm. Mild to
moderate atherosclerotic plaque of the aorta and its branches. No
abdominal, pelvic, or inguinal lymphadenopathy.

Reproductive: Prostate is unremarkable.

Other: No intraperitoneal free fluid. No intraperitoneal free gas.
No organized fluid collection.

Musculoskeletal:

Tiny fat containing umbilical hernia.

No suspicious lytic or blastic osseous lesions. No acute displaced
fracture. Multilevel degenerative changes of the spine.
IMPRESSION: 1. Suggestion of proximal acute pancreatitis. No pseudocyst
formation. Correlate with lipase levels.
2. Scattered colonic diverticulosis with no acute diverticulitis.
3. Aortic Atherosclerosis ([id number]), including coronary artery
calcifications.

## 2022-06-24 ENCOUNTER — Inpatient Hospital Stay (HOSPITAL_COMMUNITY): Payer: No Typology Code available for payment source

## 2022-06-24 ENCOUNTER — Emergency Department (HOSPITAL_COMMUNITY): Payer: No Typology Code available for payment source

## 2022-06-24 ENCOUNTER — Encounter (HOSPITAL_COMMUNITY): Payer: Self-pay

## 2022-06-24 ENCOUNTER — Inpatient Hospital Stay (HOSPITAL_COMMUNITY)
Admission: EM | Admit: 2022-06-24 | Discharge: 2022-07-10 | DRG: 233 | Disposition: A | Discharge status: Home / Self Care | Payer: No Typology Code available for payment source | Attending: Thoracic Surgery (Cardiothoracic Vascular Surgery) | Admitting: Thoracic Surgery (Cardiothoracic Vascular Surgery)

## 2022-06-24 DIAGNOSIS — E785 Hyperlipidemia, unspecified: Secondary | ICD-10-CM | POA: Insufficient documentation

## 2022-06-24 DIAGNOSIS — F1010 Alcohol abuse, uncomplicated: Secondary | ICD-10-CM | POA: Diagnosis present

## 2022-06-24 DIAGNOSIS — Y831 Surgical operation with implant of artificial internal device as the cause of abnormal reaction of the patient, or of later complication, without mention of misadventure at the time of the procedure: Secondary | ICD-10-CM | POA: Diagnosis present

## 2022-06-24 DIAGNOSIS — Z794 Long term (current) use of insulin: Secondary | ICD-10-CM | POA: Diagnosis not present

## 2022-06-24 DIAGNOSIS — K5730 Diverticulosis of large intestine without perforation or abscess without bleeding: Secondary | ICD-10-CM

## 2022-06-24 DIAGNOSIS — R079 Chest pain, unspecified: Secondary | ICD-10-CM

## 2022-06-24 DIAGNOSIS — T82855A Stenosis of coronary artery stent, initial encounter: Secondary | ICD-10-CM | POA: Diagnosis present

## 2022-06-24 DIAGNOSIS — E876 Hypokalemia: Secondary | ICD-10-CM | POA: Diagnosis present

## 2022-06-24 DIAGNOSIS — I10 Essential (primary) hypertension: Secondary | ICD-10-CM | POA: Diagnosis not present

## 2022-06-24 DIAGNOSIS — D62 Acute posthemorrhagic anemia: Secondary | ICD-10-CM | POA: Diagnosis not present

## 2022-06-24 DIAGNOSIS — R7989 Other specified abnormal findings of blood chemistry: Secondary | ICD-10-CM

## 2022-06-24 DIAGNOSIS — I700 Atherosclerosis of aorta: Secondary | ICD-10-CM | POA: Diagnosis present

## 2022-06-24 DIAGNOSIS — D122 Benign neoplasm of ascending colon: Secondary | ICD-10-CM

## 2022-06-24 DIAGNOSIS — J9811 Atelectasis: Secondary | ICD-10-CM | POA: Diagnosis present

## 2022-06-24 DIAGNOSIS — E1169 Type 2 diabetes mellitus with other specified complication: Secondary | ICD-10-CM | POA: Diagnosis present

## 2022-06-24 DIAGNOSIS — K219 Gastro-esophageal reflux disease without esophagitis: Secondary | ICD-10-CM | POA: Diagnosis not present

## 2022-06-24 DIAGNOSIS — Z20822 Contact with and (suspected) exposure to covid-19: Secondary | ICD-10-CM | POA: Diagnosis present

## 2022-06-24 DIAGNOSIS — E871 Hypo-osmolality and hyponatremia: Secondary | ICD-10-CM | POA: Diagnosis not present

## 2022-06-24 DIAGNOSIS — K449 Diaphragmatic hernia without obstruction or gangrene: Secondary | ICD-10-CM | POA: Diagnosis present

## 2022-06-24 DIAGNOSIS — I454 Nonspecific intraventricular block: Secondary | ICD-10-CM | POA: Diagnosis present

## 2022-06-24 DIAGNOSIS — D123 Benign neoplasm of transverse colon: Secondary | ICD-10-CM | POA: Diagnosis not present

## 2022-06-24 DIAGNOSIS — I2510 Atherosclerotic heart disease of native coronary artery without angina pectoris: Secondary | ICD-10-CM | POA: Diagnosis present

## 2022-06-24 DIAGNOSIS — I350 Nonrheumatic aortic (valve) stenosis: Secondary | ICD-10-CM | POA: Diagnosis not present

## 2022-06-24 DIAGNOSIS — K640 First degree hemorrhoids: Secondary | ICD-10-CM | POA: Diagnosis present

## 2022-06-24 DIAGNOSIS — E538 Deficiency of other specified B group vitamins: Secondary | ICD-10-CM | POA: Diagnosis not present

## 2022-06-24 DIAGNOSIS — Z79899 Other long term (current) drug therapy: Secondary | ICD-10-CM

## 2022-06-24 DIAGNOSIS — I110 Hypertensive heart disease with heart failure: Secondary | ICD-10-CM | POA: Diagnosis present

## 2022-06-24 DIAGNOSIS — Z7982 Long term (current) use of aspirin: Secondary | ICD-10-CM

## 2022-06-24 DIAGNOSIS — J181 Lobar pneumonia, unspecified organism: Secondary | ICD-10-CM | POA: Diagnosis not present

## 2022-06-24 DIAGNOSIS — G4733 Obstructive sleep apnea (adult) (pediatric): Secondary | ICD-10-CM | POA: Diagnosis present

## 2022-06-24 DIAGNOSIS — Z7984 Long term (current) use of oral hypoglycemic drugs: Secondary | ICD-10-CM

## 2022-06-24 DIAGNOSIS — K635 Polyp of colon: Secondary | ICD-10-CM | POA: Diagnosis present

## 2022-06-24 DIAGNOSIS — I255 Ischemic cardiomyopathy: Secondary | ICD-10-CM | POA: Diagnosis not present

## 2022-06-24 DIAGNOSIS — K222 Esophageal obstruction: Secondary | ICD-10-CM | POA: Diagnosis present

## 2022-06-24 DIAGNOSIS — I5021 Acute systolic (congestive) heart failure: Secondary | ICD-10-CM | POA: Diagnosis present

## 2022-06-24 DIAGNOSIS — K429 Umbilical hernia without obstruction or gangrene: Secondary | ICD-10-CM | POA: Diagnosis present

## 2022-06-24 DIAGNOSIS — I252 Old myocardial infarction: Secondary | ICD-10-CM

## 2022-06-24 DIAGNOSIS — I509 Heart failure, unspecified: Principal | ICD-10-CM

## 2022-06-24 DIAGNOSIS — I214 Non-ST elevation (NSTEMI) myocardial infarction: Secondary | ICD-10-CM | POA: Diagnosis present

## 2022-06-24 DIAGNOSIS — R2241 Localized swelling, mass and lump, right lower limb: Secondary | ICD-10-CM | POA: Insufficient documentation

## 2022-06-24 DIAGNOSIS — J189 Pneumonia, unspecified organism: Secondary | ICD-10-CM | POA: Diagnosis not present

## 2022-06-24 DIAGNOSIS — E118 Type 2 diabetes mellitus with unspecified complications: Secondary | ICD-10-CM | POA: Diagnosis present

## 2022-06-24 DIAGNOSIS — E1165 Type 2 diabetes mellitus with hyperglycemia: Secondary | ICD-10-CM | POA: Diagnosis not present

## 2022-06-24 DIAGNOSIS — K5731 Diverticulosis of large intestine without perforation or abscess with bleeding: Secondary | ICD-10-CM | POA: Diagnosis not present

## 2022-06-24 DIAGNOSIS — J9601 Acute respiratory failure with hypoxia: Secondary | ICD-10-CM | POA: Diagnosis not present

## 2022-06-24 DIAGNOSIS — D649 Anemia, unspecified: Secondary | ICD-10-CM

## 2022-06-24 DIAGNOSIS — Z7902 Long term (current) use of antithrombotics/antiplatelets: Secondary | ICD-10-CM

## 2022-06-24 DIAGNOSIS — I081 Rheumatic disorders of both mitral and tricuspid valves: Secondary | ICD-10-CM | POA: Diagnosis not present

## 2022-06-24 DIAGNOSIS — Z951 Presence of aortocoronary bypass graft: Secondary | ICD-10-CM | POA: Diagnosis not present

## 2022-06-24 DIAGNOSIS — R200 Anesthesia of skin: Secondary | ICD-10-CM | POA: Diagnosis present

## 2022-06-24 DIAGNOSIS — Z720 Tobacco use: Secondary | ICD-10-CM | POA: Diagnosis not present

## 2022-06-24 DIAGNOSIS — E7800 Pure hypercholesterolemia, unspecified: Secondary | ICD-10-CM | POA: Diagnosis present

## 2022-06-24 DIAGNOSIS — Z8249 Family history of ischemic heart disease and other diseases of the circulatory system: Secondary | ICD-10-CM

## 2022-06-24 DIAGNOSIS — R0789 Other chest pain: Secondary | ICD-10-CM | POA: Diagnosis not present

## 2022-06-24 DIAGNOSIS — I5023 Acute on chronic systolic (congestive) heart failure: Secondary | ICD-10-CM | POA: Diagnosis present

## 2022-06-24 DIAGNOSIS — D509 Iron deficiency anemia, unspecified: Secondary | ICD-10-CM | POA: Diagnosis present

## 2022-06-24 DIAGNOSIS — Z833 Family history of diabetes mellitus: Secondary | ICD-10-CM

## 2022-06-24 DIAGNOSIS — I340 Nonrheumatic mitral (valve) insufficiency: Secondary | ICD-10-CM | POA: Diagnosis present

## 2022-06-24 DIAGNOSIS — E119 Type 2 diabetes mellitus without complications: Secondary | ICD-10-CM | POA: Diagnosis not present

## 2022-06-24 DIAGNOSIS — Z823 Family history of stroke: Secondary | ICD-10-CM

## 2022-06-24 DIAGNOSIS — R778 Other specified abnormalities of plasma proteins: Secondary | ICD-10-CM | POA: Diagnosis present

## 2022-06-24 DIAGNOSIS — F17210 Nicotine dependence, cigarettes, uncomplicated: Secondary | ICD-10-CM | POA: Diagnosis present

## 2022-06-24 DIAGNOSIS — M7989 Other specified soft tissue disorders: Secondary | ICD-10-CM | POA: Diagnosis not present

## 2022-06-24 DIAGNOSIS — Z01810 Encounter for preprocedural cardiovascular examination: Secondary | ICD-10-CM | POA: Diagnosis not present

## 2022-06-24 DIAGNOSIS — R000 Tachycardia, unspecified: Secondary | ICD-10-CM | POA: Diagnosis not present

## 2022-06-24 LAB — ECHOCARDIOGRAM COMPLETE
AR max vel: 3.59 cm2
AV Area VTI: 3.6 cm2
AV Area mean vel: 3.39 cm2
AV Mean grad: 2 mmHg
AV Peak grad: 4.6 mmHg
Ao pk vel: 1.07 m/s
Area-P 1/2: 8.72 cm2
Calc EF: 40 %
MV M vel: 5.15 m/s
MV Peak grad: 105.9 mmHg
Radius: 0.4 cm
S' Lateral: 4.5 cm
Single Plane A2C EF: 37 %
Single Plane A4C EF: 40.7 %

## 2022-06-24 LAB — TROPONIN I (HIGH SENSITIVITY)
Troponin I (High Sensitivity): 139 ng/L (ref <18)
Troponin I (High Sensitivity): 106 ng/L (ref <18)

## 2022-06-24 LAB — BASIC METABOLIC PANEL
Anion gap: 15 (ref 5–15)
BUN: 17 mg/dL (ref 6–20)
CO2: 23 mmol/L (ref 22–32)
Calcium: 10.2 mg/dL (ref 8.9–10.3)
Chloride: 99 mmol/L (ref 98–111)
Creatinine, Ser: 0.87 mg/dL (ref 0.61–1.24)
GFR, Estimated: >60 mL/min (ref >60)
Glucose, Bld: 295 mg/dL — ABNORMAL HIGH (ref 70–99)
Potassium: 3.4 mmol/L — ABNORMAL LOW (ref 3.5–5.1)
Sodium: 137 mmol/L (ref 135–145)

## 2022-06-24 LAB — BRAIN NATRIURETIC PEPTIDE: B Natriuretic Peptide: 639 pg/mL — ABNORMAL HIGH (ref 0.0–100.0)

## 2022-06-24 LAB — HEMOGLOBIN A1C
Hgb A1c MFr Bld: 8.2 % — ABNORMAL HIGH (ref 4.8–5.6)
Mean Plasma Glucose: 188.64 mg/dL

## 2022-06-24 LAB — CBC
HCT: 28.5 % — ABNORMAL LOW (ref 39.0–52.0)
Hemoglobin: 8.2 g/dL — ABNORMAL LOW (ref 13.0–17.0)
MCH: 22.2 pg — ABNORMAL LOW (ref 26.0–34.0)
MCHC: 28.8 g/dL — ABNORMAL LOW (ref 30.0–36.0)
MCV: 77 fL — ABNORMAL LOW (ref 80.0–100.0)
Platelets: 364 10*3/uL (ref 150–400)
RBC: 3.7 MIL/uL — ABNORMAL LOW (ref 4.22–5.81)
RDW: 21.1 % — ABNORMAL HIGH (ref 11.5–15.5)
WBC: 10.9 10*3/uL — ABNORMAL HIGH (ref 4.0–10.5)
nRBC: 0 % (ref 0.0–0.2)

## 2022-06-24 LAB — CBG MONITORING, ED
Glucose-Capillary: 325 mg/dL — ABNORMAL HIGH (ref 70–99)
Glucose-Capillary: 123 mg/dL — ABNORMAL HIGH (ref 70–99)
Glucose-Capillary: 244 mg/dL — ABNORMAL HIGH (ref 70–99)

## 2022-06-24 LAB — HEMOGLOBIN AND HEMATOCRIT, BLOOD
HCT: 28 % — ABNORMAL LOW (ref 39.0–52.0)
HCT: 28.7 % — ABNORMAL LOW (ref 39.0–52.0)
Hemoglobin: 8.1 g/dL — ABNORMAL LOW (ref 13.0–17.0)
Hemoglobin: 8.4 g/dL — ABNORMAL LOW (ref 13.0–17.0)

## 2022-06-24 LAB — MAGNESIUM: Magnesium: 1.5 mg/dL — ABNORMAL LOW (ref 1.7–2.4)

## 2022-06-24 MED ORDER — PANTOPRAZOLE SODIUM 40 MG IV SOLR
40.0000 mg | INTRAVENOUS | Status: DC
  Administered 2022-06-24: 40 mg via INTRAVENOUS
  Filled 2022-06-24: qty 10

## 2022-06-24 MED ORDER — LISINOPRIL 20 MG PO TABS: 40.0000 mg | ORAL_TABLET | Freq: Every day | ORAL | Status: DC

## 2022-06-24 MED ORDER — FUROSEMIDE 10 MG/ML IJ SOLN
40.0000 mg | Freq: Two times a day (BID) | INTRAMUSCULAR | Status: DC
  Administered 2022-06-25 (×2): 40 mg via INTRAVENOUS
  Filled 2022-06-24 (×2): qty 4

## 2022-06-24 MED ORDER — ASPIRIN 325 MG PO TABS
325.0000 mg | ORAL_TABLET | Freq: Every day | ORAL | Status: DC
  Administered 2022-06-25: 325 mg via ORAL
  Filled 2022-06-24: qty 1

## 2022-06-24 MED ORDER — ACETAMINOPHEN 325 MG PO TABS
650.0000 mg | ORAL_TABLET | ORAL | Status: DC | PRN
  Administered 2022-06-25 – 2022-06-26 (×2): 650 mg via ORAL
  Filled 2022-06-24 (×2): qty 2

## 2022-06-24 MED ORDER — POTASSIUM CHLORIDE CRYS ER 20 MEQ PO TBCR
40.0000 meq | EXTENDED_RELEASE_TABLET | Freq: Every day | ORAL | Status: DC
  Administered 2022-06-24 – 2022-06-26 (×3): 40 meq via ORAL
  Filled 2022-06-24 (×3): qty 2

## 2022-06-24 MED ORDER — INSULIN ASPART 100 UNIT/ML IJ SOLN
0.0000 [IU] | Freq: Three times a day (TID) | INTRAMUSCULAR | Status: DC
  Administered 2022-06-24: 11 [IU] via SUBCUTANEOUS
  Administered 2022-06-24: 2 [IU] via SUBCUTANEOUS
  Administered 2022-06-25 – 2022-06-26 (×2): 8 [IU] via SUBCUTANEOUS
  Administered 2022-06-26: 11 [IU] via SUBCUTANEOUS
  Administered 2022-06-26: 5 [IU] via SUBCUTANEOUS
  Administered 2022-06-27: 11 [IU] via SUBCUTANEOUS
  Administered 2022-06-27: 15 [IU] via SUBCUTANEOUS
  Administered 2022-06-27: 8 [IU] via SUBCUTANEOUS
  Administered 2022-06-28 (×2): 11 [IU] via SUBCUTANEOUS
  Administered 2022-06-28: 15 [IU] via SUBCUTANEOUS
  Administered 2022-06-29: 8 [IU] via SUBCUTANEOUS
  Administered 2022-06-29: 3 [IU] via SUBCUTANEOUS
  Administered 2022-06-29: 11 [IU] via SUBCUTANEOUS
  Administered 2022-06-30: 5 [IU] via SUBCUTANEOUS
  Administered 2022-06-30: 3 [IU] via SUBCUTANEOUS
  Administered 2022-06-30: 5 [IU] via SUBCUTANEOUS
  Administered 2022-07-01: 2 [IU] via SUBCUTANEOUS
  Administered 2022-07-02 – 2022-07-03 (×2): 5 [IU] via SUBCUTANEOUS
  Administered 2022-07-03 – 2022-07-04 (×3): 3 [IU] via SUBCUTANEOUS
  Filled 2022-06-24: qty 0.15

## 2022-06-24 MED ORDER — MAGNESIUM SULFATE 2 GM/50ML IV SOLN
2.0000 g | Freq: Once | INTRAVENOUS | Status: AC
  Administered 2022-06-24: 2 g via INTRAVENOUS
  Filled 2022-06-24: qty 50

## 2022-06-24 MED ORDER — SIMVASTATIN 20 MG PO TABS
80.0000 mg | ORAL_TABLET | Freq: Every day | ORAL | Status: DC
  Administered 2022-06-25: 80 mg via ORAL
  Filled 2022-06-24: qty 4

## 2022-06-24 MED ORDER — INSULIN ASPART 100 UNIT/ML IJ SOLN
0.0000 [IU] | Freq: Every day | INTRAMUSCULAR | Status: DC
  Administered 2022-06-24: 2 [IU] via SUBCUTANEOUS
  Administered 2022-06-25 – 2022-06-26 (×2): 5 [IU] via SUBCUTANEOUS
  Administered 2022-06-27: 4 [IU] via SUBCUTANEOUS
  Administered 2022-06-28 – 2022-06-29 (×2): 3 [IU] via SUBCUTANEOUS
  Filled 2022-06-24: qty 0.05

## 2022-06-24 MED ORDER — CARVEDILOL 25 MG PO TABS
25.0000 mg | ORAL_TABLET | Freq: Two times a day (BID) | ORAL | Status: DC
  Administered 2022-06-25: 25 mg via ORAL
  Filled 2022-06-24: qty 1

## 2022-06-24 MED ORDER — POTASSIUM CHLORIDE 10 MEQ/100ML IV SOLN
10.0000 meq | INTRAVENOUS | Status: DC
  Filled 2022-06-24: qty 100

## 2022-06-24 MED ORDER — MORPHINE SULFATE (PF) 4 MG/ML IV SOLN
4.0000 mg | Freq: Once | INTRAVENOUS | Status: AC
  Administered 2022-06-24: 4 mg via INTRAVENOUS
  Filled 2022-06-24: qty 1

## 2022-06-24 MED ORDER — FUROSEMIDE 10 MG/ML IJ SOLN
40.0000 mg | Freq: Once | INTRAMUSCULAR | Status: AC
  Administered 2022-06-24: 40 mg via INTRAVENOUS
  Filled 2022-06-24: qty 4

## 2022-06-24 MED ORDER — ONDANSETRON HCL 4 MG/2ML IJ SOLN
4.0000 mg | Freq: Four times a day (QID) | INTRAMUSCULAR | Status: DC | PRN
  Administered 2022-06-25 – 2022-06-27 (×2): 4 mg via INTRAVENOUS
  Filled 2022-06-24: qty 2

## 2022-06-24 NOTE — Inpatient Diabetes Management (Signed)
Inpatient Diabetes Program Recommendations  AACE/ADA: New Consensus Statement on Inpatient Glycemic Control (2015)  Target Ranges:  Prepandial:   less than 140 mg/dL      Peak postprandial:   less than 180 mg/dL (1-2 hours)      Critically ill patients:  140 - 180 mg/dL    Latest Reference Range & Units 06/24/22 11:19  Glucose-Capillary 70 - 99 mg/dL 325 (H)  11 units Novolog   (H): Data is abnormally high    Home DM Meds:  Lantus 50 QHS  Jardiance 12.5 mg QD  Glipizide 5 mg QD  Metformin 500 mg BID    Current Orders: Novolog 0-15 units TID ac/hs     MD- Note pt takes Lantus 50 units QHS at home  CBG 325 at 11am  Please consider starting 50% home dose basal insulin tonight:  Semglee 25 units QHS    --Will follow patient during hospitalization--  Wyn Quaker RN, MSN, [facility] Coordinator Inpatient Glycemic Control Team Team Pager: [phone] (8a-5p)

## 2022-06-24 NOTE — H&P (Signed)
History and Physical    Patient: [name] IOE:[id number] DOB: [birth year] DOA: 06/24/2022 DOS: the patient was seen and examined on 06/24/2022 PCP: Charolette Forward, MD  Patient coming from: Home  Chief Complaint:  Chief Complaint  Patient presents with   Chest Pain   HPI: [name] is a 56 y.o. male with medical history significant of CAD, GERD, HTN, HLD, tobacco abuse. Presenting with chest pain. He reports his symptoms initially started 3 nights ago. He had been grilling during the day and he sat down to watch some football games. He noticed that he had left side chest pressure and that it radiated to the right. It was mild at first. He had concomitant bloating and belching. He took some ASA and was able to sleep ok that night. The next day his symptoms became more persistent. They were present whether he was at rest or not. He didn't have any palpitations, dyspnea, lightheadedness or dizziness. His symptoms continued through this morning and he decided to come to the ED for help. He denies any other aggravating or alleviating factors.     Review of Systems: As mentioned in the history of present illness. All other systems reviewed and are negative. Past Medical History:  Diagnosis Date   Acid reflux    Concussion    Coronary artery disease    Diabetes ([facility])    ETOH abuse    High cholesterol    Hypertension    MI (myocardial infarction) ([place])    2012   Sleep apnea    USES CPAP AS NEEDED   Tobacco abuse    Past Surgical History:  Procedure Laterality Date   KNEE ARTHROSCOPY     ORIF ULNAR FRACTURE Right 12/31/2016   Procedure: OPEN REDUCTION INTERNAL FIXATION (ORIF) both bone forearm fracture;  Surgeon: Roseanne Kaufman, MD;  Location: [facility];  Service: Orthopedics;  Laterality: Right;   stents     Social History:  reports that he has been smoking cigarettes. He has a 7.50 pack-year smoking history. He has never used smokeless tobacco. He reports current alcohol  use of about 6.0 standard drinks of alcohol per week. He reports that he does not use drugs.  No Known Allergies  Family History  Problem Relation Age of Onset   Diabetes Mother     Prior to Admission medications   Medication Sig Start Date End Date Taking? Authorizing Provider  aspirin 325 MG tablet Take 325 mg by mouth daily.    [provider]  carvedilol (COREG) 25 MG tablet Take 25 mg by mouth 2 (two) times daily with a meal.    [provider]  glipiZIDE (GLUCOTROL) 5 MG tablet Take 5 mg by mouth daily. 30 minutes prior to a meal    [provider]  insulin glargine (LANTUS) 100 UNIT/ML Solostar Pen Inject 50 Units into the skin at bedtime.    [provider]  lisinopril (ZESTRIL) 40 MG tablet Take 40 mg by mouth daily.    [provider]  ondansetron (ZOFRAN ODT) 8 MG disintegrating tablet Take 1 tablet (8 mg total) by mouth every 8 (eight) hours as needed for nausea or vomiting. 01/04/21   Molpus, Jenny Reichmann, MD  oxyCODONE-acetaminophen (PERCOCET) 5-325 MG tablet Take 1 tablet by mouth every 4 (four) hours as needed for severe pain. 01/04/21   Molpus, John, MD  sildenafil (VIAGRA) 100 MG tablet Take 50 mg by mouth daily as needed for erectile dysfunction.    [provider]  simvastatin (ZOCOR) 80 MG tablet Take 80 mg by mouth at bedtime.    [provider]  Vitamin D, Ergocalciferol, (DRISDOL) 1.25 MG (50000 UNIT) CAPS capsule Take 50,000 Units by mouth every 7 (seven) days.    [provider]    Physical Exam: Vitals:   06/24/22 0915 06/24/22 0925 06/24/22 0930 06/24/22 0945  BP:  111/80 106/77   Pulse: 97 89 99 73  Resp: 17 (!) 26 (!) 29 (!) 24  Temp:      TempSrc:      SpO2: 90% 93% 93% 96%   General: 56 y.o. male resting in bed in NAD Eyes: PERRL, normal sclera ENMT: Nares patent w/o discharge, orophaynx clear, dentition normal, ears w/o discharge/lesions/ulcers Neck: Supple, trachea  midline Cardiovascular: RRR, +S1, S2, no m/g/r, equal pulses throughout Respiratory: soft rhales at bases, no w/r, normal WOB on 2L GI: BS+, NDNT, no masses noted, no organomegaly noted MSK: No c/c; RLE edema from ankle to mid-shin 2+ non-pitting Neuro: A&O x 3, no focal deficits Psyc: Appropriate interaction and affect, calm/cooperative  Data Reviewed:  Lab Results  Component Value Date   NA 137 06/24/2022   K 3.4 (L) 06/24/2022   CO2 23 06/24/2022   GLUCOSE 295 (H) 06/24/2022   BUN 17 06/24/2022   CREATININE 0.87 06/24/2022   CALCIUM 10.2 06/24/2022   GFRNONAA >60 06/24/2022   Lab Results  Component Value Date   WBC 10.9 (H) 06/24/2022   HGB 8.2 (L) 06/24/2022   HCT 28.5 (L) 06/24/2022   MCV 77.0 (L) 06/24/2022   PLT 364 06/24/2022   BNP: 639 Trp  139   CXR: Bilateral airspace densities in the lower lungs bilaterally. Differential diagnosis includes bilateral pneumonia. Pulmonary edema cannot be excluded.  EKG: sinus tach, st depression V5/6  Assessment and Plan: Chest pain Elevated troponin Elevated BNP     - admit to inpt, card-tele @ [facility]     - cards onboard, appreciate assistance     - continue lasix     - check echo     - trend trp     - continue ASA, statin  CAD HLD     - continue home regimen  RLE swelling     - asymmetric swelling of RLE, check doppler     - no pain or erythema  HTN     - pressures are soft and he's getting lasix; will hold additional home regimen for now  GERD     - PPI  DM2     - SSI, glucose checks     - NPO for now     - A1c  Tobacco abuse     - counsel against further use  Microcytic anemia     - check iron studies     - check FOBT; reported to cards he had a black stool this morning     - check q6h H&H     - PPI  Hypokalemia     - replace K+, check Mg2+  Advance Care Planning:   Code Status: FULL  Consults: Cardiology  Family Communication: None at bedside  Severity of Illness: The appropriate  patient status for this patient is INPATIENT. Inpatient status is judged to be reasonable and necessary in order to provide the required intensity of service to ensure the patient's safety. The patient's presenting symptoms, physical exam findings, and initial radiographic and laboratory data in the context of their chronic comorbidities is felt to place them at  high risk for further clinical deterioration. Furthermore, it is not anticipated that the patient will be medically stable for discharge from the hospital within 2 midnights of admission.   * I certify that at the point of admission it is my clinical judgment that the patient will require inpatient hospital care spanning beyond 2 midnights from the point of admission due to high intensity of service, high risk for further deterioration and high frequency of surveillance required.*  Time spent in coordination of this H&P: 53 minutes  Author: Jonnie Finner, DO 06/24/2022 10:01 AM  For on call review [email or web address].

## 2022-06-24 NOTE — ED Notes (Signed)
[facility] transport set up for patient

## 2022-06-24 NOTE — ED Notes (Signed)
Report given to Cindy RN.

## 2022-06-24 NOTE — Progress Notes (Signed)
Patient arrived to room 3E19 from ED.  Assessment complete, VS obtained, and Admission database began.

## 2022-06-24 NOTE — Progress Notes (Signed)
Right lower extremity venous duplex has been completed. Preliminary results can be found in CV Proc through chart review.   06/24/22 10:54 AM [name] RVT

## 2022-06-24 NOTE — Consult Note (Signed)
Cardiology Consultation   Patient ID: Zykeem Bauserman MRN: [id number]; DOB: 07/09/1966  Admit date: 06/24/2022 Date of Consult: 06/24/2022  PCP:  Charolette Forward, [place] Prov[facility] Cardiologist:  None        Patient Profile:   [name] is a 56 y.o. male with a hx of MI 2012 with multiple stents who is being seen 06/24/2022 for the evaluation of chest pain elevated troponin 139 shortness of breath at the request of Dr. Marylyn Ishihara.  History of Present Illness:   [name] is a 56 year old male with prior myocardial infarction in 2012 with multivessel stenting placed here with shortness of breath, chest pain, troponin of 139, BNP of 639 as well as anemia with hemoglobin of 8.2 down from 12.9 last March.  Chest x-ray shows findings compatible with pulmonary edema personally reviewed and interpreted  He was sitting on the sofa watching football when symptoms of chest pressure and shortness of breath became worse.  He also had some abdominal distention and belching.  He notes that he did have a black stool this morning.  EKG shows sinus rhythm with downsloping ST segment depression in the anterior lateral precordial leads.  In the past he has had some T wave inversions in anterolateral leads as well.  More comfortable after Lasix.  Takes aspirin.  Not on Plavix  Past Medical History:  Diagnosis Date   Acid reflux    Concussion    Coronary artery disease    Diabetes (HCC)    ETOH abuse    High cholesterol    Hypertension    MI (myocardial infarction) ([facility])    2012   Sleep apnea    USES CPAP AS NEEDED   Tobacco abuse     Past Surgical History:  Procedure Laterality Date   KNEE ARTHROSCOPY     ORIF ULNAR FRACTURE Right 12/31/2016   Procedure: OPEN REDUCTION INTERNAL FIXATION (ORIF) both bone forearm fracture;  Surgeon: Roseanne Kaufman, MD;  Location: [facility];  Service: Orthopedics;  Laterality: Right;   stents       Home Medications:  Prior to  Admission medications   Medication Sig Start Date End Date Taking? Authorizing Provider  aspirin 325 MG tablet Take 325 mg by mouth daily.    [provider]  carvedilol (COREG) 25 MG tablet Take 25 mg by mouth 2 (two) times daily with a meal.    [provider]  glipiZIDE (GLUCOTROL) 5 MG tablet Take 5 mg by mouth daily. 30 minutes prior to a meal    [provider]  insulin glargine (LANTUS) 100 UNIT/ML Solostar Pen Inject 50 Units into the skin at bedtime.    [provider]  lisinopril (ZESTRIL) 40 MG tablet Take 40 mg by mouth daily.    [provider]  ondansetron (ZOFRAN ODT) 8 MG disintegrating tablet Take 1 tablet (8 mg total) by mouth every 8 (eight) hours as needed for nausea or vomiting. 01/04/21   Molpus, Jenny Reichmann, MD  oxyCODONE-acetaminophen (PERCOCET) 5-325 MG tablet Take 1 tablet by mouth every 4 (four) hours as needed for severe pain. 01/04/21   Molpus, John, MD  sildenafil (VIAGRA) 100 MG tablet Take 50 mg by mouth daily as needed for erectile dysfunction.    [provider]  simvastatin (ZOCOR) 80 MG tablet Take 80 mg by mouth at bedtime.    [provider]  Vitamin D, Ergocalciferol, (DRISDOL) 1.25 MG (50000 UNIT) CAPS capsule Take 50,000 Units by  mouth every 7 (seven) days.    [provider]    Inpatient Medications: Scheduled Meds:  Continuous Infusions:  PRN Meds:   Allergies:   No Known Allergies  Social History:   Social History   Socioeconomic History   Marital status: Divorced    Spouse name: Not on file   Number of children: Not on file   Years of education: Not on file   Highest education level: Not on file  Occupational History   Not on file  Tobacco Use   Smoking status: Every Day    Packs/day: 0.25    Years: 30.00    Total pack years: 7.50    Types: Cigarettes   Smokeless tobacco: Never  Vaping Use   Vaping Use: Never used  Substance and Sexual Activity   Alcohol use: Yes     Alcohol/week: 6.0 standard drinks of alcohol    Types: 6 Cans of beer per week    Comment: occassionally on weekends   Drug use: No   Sexual activity: Not on file  Other Topics Concern   Not on file  Social History Narrative   Not on file   Social Determinants of Health   Financial Resource Strain: Not on file  Food Insecurity: Not on file  Transportation Needs: Not on file  Physical Activity: Not on file  Stress: Not on file  Social Connections: Not on file  Intimate Partner Violence: Not on file    Family History:    Family History  Problem Relation Age of Onset   Diabetes Mother      ROS:  Please see the history of present illness.   All other ROS reviewed and negative.     Physical Exam/Data:   Vitals:   06/24/22 0915 06/24/22 0925 06/24/22 0930 06/24/22 0945  BP:  111/80 106/77   Pulse: 97 89 99 73  Resp: 17 (!) 26 (!) 29 (!) 24  Temp:      TempSrc:      SpO2: 90% 93% 93% 96%   No intake or output data in the 24 hours ending 06/24/22 1050    01/03/2021    9:18 PM 04/04/2020    8:18 PM 12/29/2019   12:20 PM  Last 3 Weights  Weight (lbs) 145 lb 144 lb 2.9 oz 175 lb  Weight (kg) 65.772 kg 65.4 kg 79.379 kg     There is no height or weight on file to calculate BMI.  General:  Well nourished, well developed, in no acute distress HEENT: normal Neck: no JVD Vascular: No carotid bruits; Distal pulses 2+ bilaterally Cardiac:  normal S1, S2; RRR; no murmur  Lungs: Crackles heard at bases bilaterally Abd: soft, nontender, no hepatomegaly  Ext: Right lower extremity greater than left.  Hurt his toe recently. Musculoskeletal:  No deformities, BUE and BLE strength normal and equal Skin: warm and dry  Neuro:  CNs 2-12 intact, no focal abnormalities noted Psych:  Normal affect   EKG:  The EKG was personally reviewed and demonstrates: As above Telemetry:  Telemetry was personally reviewed and demonstrates: No adverse arrhythmias  Relevant CV Studies: Cardiac  catheterization 03/21/2011: PROCEDURES: 1. Left cardiac cath with selective left and right coronary     angiography, LV graft via right groin using Judkins technique. 2. Successful PTCA to small diagonal 2 using 2.0 x 12 mm long mini     Trek balloon. 3. Successful PTCA to mid LAD using 2.5 x 15 mm long mini Trek  balloon. 4. Successful deployment of 2.75 x 26 mm Resolute integrity drug-     eluting stent in mid LAD. 5. Successful postdilatation of the stent using 3.0 x 20 mm long [place]     Trek balloon. 6. Successful postdilatation of proximal 1/4th of the stent using 3.5     x 8 mm long [place] Trek balloon. 7. Successful PTCA to mid ramus using 2.4 x 12 mm long mini Trek     balloon. 8. Successful deployment of 2.5 x 18 mm long Resolute integrity drug-     eluting stent in mid ramus. 9. Successful postdilatation of the stent using 2.5 x 15 mm long [place]     Trek balloon. 10.Successful PTCA to OM-3 using 2.5 x 12 mm long mini Trek balloon. 11.Successful deployment of 2.5 x 26 mm long Resolute integrity drug-     eluting stent in proximal and mid OM-3. 12.Successful postdilatation of the stent using 2.75 x 20 mm long [place]     Trek balloon.  Laboratory Data:  High Sensitivity Troponin:   Recent Labs  Lab 06/24/22 0809  TROPONINIHS 139*     Chemistry Recent Labs  Lab 06/24/22 0809  NA 137  K 3.4*  CL 99  CO2 23  GLUCOSE 295*  BUN 17  CREATININE 0.87  CALCIUM 10.2  GFRNONAA >60  ANIONGAP 15    No results for input(s): "PROT", "ALBUMIN", "AST", "ALT", "ALKPHOS", "BILITOT" in the last 168 hours. Lipids No results for input(s): "CHOL", "TRIG", "HDL", "LABVLDL", "LDLCALC", "CHOLHDL" in the last 168 hours.  Hematology Recent Labs  Lab 06/24/22 0809  WBC 10.9*  RBC 3.70*  HGB 8.2*  HCT 28.5*  MCV 77.0*  MCH 22.2*  MCHC 28.8*  RDW 21.1*  PLT 364   Thyroid No results for input(s): "TSH", "FREET4" in the last 168 hours.  BNP Recent Labs  Lab 06/24/22 0809  BNP 639.0*     DDimer No results for input(s): "DDIMER" in the last 168 hours.   Radiology/Studies:  DG Chest 2 View  Result Date: 06/24/2022 CLINICAL DATA:  Chest pain. EXAM: CHEST - 2 VIEW COMPARISON:  Chest radiograph 12/29/2019 FINDINGS: Two views of the chest demonstrate patchy bilateral airspace densities in the lower lungs. Central vascular structures are slightly prominent and there may be mild peribronchial thickening. Heart size is within normal limits. No large pleural effusions. Trachea is midline. Negative for a pneumothorax. No acute bone abnormality. Old left rib fractures. IMPRESSION: Bilateral airspace densities in the lower lungs bilaterally. Differential diagnosis includes bilateral pneumonia. Pulmonary edema cannot be excluded. Electronically Signed   By: Markus Daft M.D.   On: 06/24/2022 08:30     Assessment and Plan:   Chest pain/elevated troponin/prior myocardial infarction 2012 -Mildly elevated troponin.  Has been admitted in the past with abdominal discomfort/pancreatitis -Prior myocardial infarction back in 2012 with DES to LAD, diagonal, ramus, obtuse marginal 3 previously followed by Dr. Terrence Dupont.  He was contacted but since he has not seen the patient since 2018 he asked the ER to call [facility] off on heparin IV given anemia for now -Okay with transfer over to [facility] in case cardiac procedure is needed. -Continue with carvedilol.  Lisinopril. -Given his elevated BNP, pulmonary edema on chest x-ray, could have underlying systolic dysfunction.  Checking echocardiogram.  Anemia - With dark stool earlier today, question possibility of indolent GI bleeding.  Hemoglobin 8.2 from 13 back in March 2022.  Discussed with Dr. Marylyn Ishihara.  FOBT.  MCV  77 indicative of iron deficiency.  Protonix IV  Tobacco use - Continue to encourage cessation  Hyperlipidemia - Continue with statin therapy LDL goal less than 70.  We will change from simvastatin over to Crestor 20 mg.  Diabetes with  hypertension - Per primary team.  Right greater than left lower extremity edema - Vascular study to be performed.  Injured his right toe previously.  Risk Assessment/Risk Scores:     TIMI Risk Score for Unstable Angina or Non-ST Elevation MI:   The patient's TIMI risk score is 5, which indicates a 26% risk of all cause mortality, new or recurrent myocardial infarction or need for urgent revascularization in the next 14 days.  New York Heart Association (NYHA) Functional Class NYHA Class III       For questions or updates, please contact [facility] Please consult [email or web address] for contact info under    Signed, Candee Furbish, MD  06/24/2022 10:50 AM

## 2022-06-24 NOTE — ED Provider Notes (Signed)
[facility] DEPT Provider Note   CSN: [id number] Arrival date & time: 06/24/22  0759     History  Chief Complaint  Patient presents with   Chest Pain    [name] is a 56 y.o. male.  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 Terrence Dupont- last seen in 2018), also hypertension, hyperlipidemia, diabetes and current smoker presents with complaint of left-sided chest discomfort described as a pressure which radiates across his chest.  Symptoms started on Sunday while sitting on the sofa watching the game, have gotten progressively worse.  Associated with shortness of breath (more so/starting today), abdominal distention and belching (initially on Sunday leading into the [facility] today). Feels similar to his prior MI.    Took '325mg'$  ASA this morning. Last took Viagra on Sunday at [place] Medications Prior to Admission medications   Medication Sig Start Date End Date Taking? Authorizing Provider  aspirin 325 MG tablet Take 325 mg by mouth daily.   Yes [provider]  Calcium Polycarbophil (FIBER) 625 MG TABS Take 2 tablets by mouth daily. 04/15/22  Yes [provider]  carvedilol (COREG) 25 MG tablet Take 25 mg by mouth 2 (two) times daily with a meal.   Yes [provider]  empagliflozin (JARDIANCE) 25 MG TABS tablet Take 0.5 tablets by mouth daily. 12/27/21  Yes [provider]  glipiZIDE (GLUCOTROL) 5 MG tablet Take 5 mg by mouth daily. 30 minutes prior to a meal   Yes [provider]  insulin glargine (LANTUS) 100 UNIT/ML Solostar Pen Inject 50 Units into the skin at bedtime.   Yes [provider]  lisinopril (ZESTRIL) 10 MG tablet Take 40 mg by mouth daily. 06/19/22  Yes [provider]  metFORMIN (GLUCOPHAGE-XR) 500 MG 24 hr tablet Take 500 mg by mouth 2 (two) times daily with a meal. 06/19/22  Yes [provider]  ondansetron  (ZOFRAN) 8 MG tablet Take 4 mg by mouth 3 (three) times daily as needed for nausea. 05/27/22  Yes [provider]  sildenafil (VIAGRA) 100 MG tablet Take 50 mg by mouth daily as needed for erectile dysfunction.   Yes [provider]  simvastatin (ZOCOR) 40 MG tablet Take 80 mg by mouth at bedtime. 12/27/21  Yes [provider]  Vitamin D, Ergocalciferol, (DRISDOL) 1.25 MG (50000 UNIT) CAPS capsule Take 50,000 Units by mouth every 7 (seven) days.   Yes [provider]      Allergies    Patient has no known allergies.    Review of Systems   Review of Systems Negative except as per HPI Physical Exam Updated Vital Signs BP (!) 114/95   Pulse 95   Temp 98 F (36.7 C) (Oral)   Resp 15   SpO2 97%  Physical Exam Vitals and nursing note reviewed.  Constitutional:      General: He is in acute distress.     Appearance: He is well-developed. He is diaphoretic.  HENT:     Head: Normocephalic and atraumatic.  Cardiovascular:     Rate and Rhythm: Regular rhythm. Tachycardia present.     Heart sounds: Normal heart sounds.  Pulmonary:     Effort: Tachypnea present.     Breath sounds: Examination of the right-lower field reveals decreased breath sounds. Examination of the left-lower field reveals decreased breath sounds. Decreased breath sounds present.  Chest:  Chest wall: No tenderness.  Abdominal:     Palpations: Abdomen is soft.     Tenderness: There is no abdominal tenderness.  Musculoskeletal:     Right lower leg: No tenderness. Edema present.     Left lower leg: No tenderness. No edema.     Comments: Mildly tender right 3rd toe with bruising  Swelling to right foot/ankle, no calf tenderness  Skin:    General: Skin is warm.  Neurological:     Mental Status: He is alert and oriented to person, place, and time.  Psychiatric:        Behavior: Behavior normal.     ED Results / Procedures / Treatments   Labs (all labs ordered are listed, but  only abnormal results are displayed) Labs Reviewed  BASIC METABOLIC PANEL - Abnormal; Notable for the following components:      Result Value   Potassium 3.4 (*)    Glucose, Bld 295 (*)    All other components within normal limits  CBC - Abnormal; Notable for the following components:   WBC 10.9 (*)    RBC 3.70 (*)    Hemoglobin 8.2 (*)    HCT 28.5 (*)    MCV 77.0 (*)    MCH 22.2 (*)    MCHC 28.8 (*)    RDW 21.1 (*)    All other components within normal limits  BRAIN NATRIURETIC PEPTIDE - Abnormal; Notable for the following components:   B Natriuretic Peptide 639.0 (*)    All other components within normal limits  HEMOGLOBIN AND HEMATOCRIT, BLOOD - Abnormal; Notable for the following components:   Hemoglobin 8.1 (*)    HCT 28.0 (*)    All other components within normal limits  MAGNESIUM - Abnormal; Notable for the following components:   Magnesium 1.5 (*)    All other components within normal limits  CBG MONITORING, ED - Abnormal; Notable for the following components:   Glucose-Capillary 325 (*)    All other components within normal limits  TROPONIN I (HIGH SENSITIVITY) - Abnormal; Notable for the following components:   Troponin I (High Sensitivity) 139 (*)    All other components within normal limits  TROPONIN I (HIGH SENSITIVITY) - Abnormal; Notable for the following components:   Troponin I (High Sensitivity) 106 (*)    All other components within normal limits  OCCULT BLOOD X 1 CARD TO LAB, STOOL  IRON AND TIBC  HEMOGLOBIN AND HEMATOCRIT, BLOOD  HEMOGLOBIN A1C  HEMOGLOBIN AND HEMATOCRIT, BLOOD    EKG None  Radiology VAS [place] LOWER EXTREMITY VENOUS (DVT)  Result Date: 06/24/2022  Lower Venous DVT Study Patient Name:  [name]  Date of Exam:   06/24/2022 Medical Rec #: [id number]             Accession #:    [id number] Date of Birth: 1966/08/03              Patient Gender: M Patient Age:   65 years Exam Location:  [facility] Procedure:      VAS [place]  LOWER EXTREMITY VENOUS (DVT) Referring Phys: Cherylann Ratel --------------------------------------------------------------------------------  Indications: Swelling.  Risk Factors: None identified. Comparison Study: No prior studies. Performing Technologist: Oliver Hum RVT  Examination Guidelines: A complete evaluation includes B-mode imaging, spectral Doppler, color Doppler, and power Doppler as needed of all accessible portions of each vessel. Bilateral testing is considered an integral part of a complete examination. Limited examinations for reoccurring indications may be performed as noted.  The reflux portion of the exam is performed with the patient in reverse Trendelenburg.  +---------+---------------+---------+-----------+----------+--------------+ RIGHT    CompressibilityPhasicitySpontaneityPropertiesThrombus Aging +---------+---------------+---------+-----------+----------+--------------+ CFV      Full           Yes      Yes                                 +---------+---------------+---------+-----------+----------+--------------+ SFJ      Full                                                        +---------+---------------+---------+-----------+----------+--------------+ FV Prox  Full                                                        +---------+---------------+---------+-----------+----------+--------------+ FV Mid   Full                                                        +---------+---------------+---------+-----------+----------+--------------+ FV DistalFull                                                        +---------+---------------+---------+-----------+----------+--------------+ PFV      Full                                                        +---------+---------------+---------+-----------+----------+--------------+ POP      Full           Yes      Yes                                  +---------+---------------+---------+-----------+----------+--------------+ PTV      Full                                                        +---------+---------------+---------+-----------+----------+--------------+ PERO     Full                                                        +---------+---------------+---------+-----------+----------+--------------+   +----+---------------+---------+-----------+----------+--------------+ LEFTCompressibilityPhasicitySpontaneityPropertiesThrombus Aging +----+---------------+---------+-----------+----------+--------------+ CFV Full           Yes      Yes                                 +----+---------------+---------+-----------+----------+--------------+  Summary: RIGHT: - There is no evidence of deep vein thrombosis in the lower extremity.  - No cystic structure found in the popliteal fossa.  LEFT: - No evidence of common femoral vein obstruction.  *See table(s) above for measurements and observations.    Preliminary    DG Chest 2 View  Result Date: 06/24/2022 CLINICAL DATA:  Chest pain. EXAM: CHEST - 2 VIEW COMPARISON:  Chest radiograph 12/29/2019 FINDINGS: Two views of the chest demonstrate patchy bilateral airspace densities in the lower lungs. Central vascular structures are slightly prominent and there may be mild peribronchial thickening. Heart size is within normal limits. No large pleural effusions. Trachea is midline. Negative for a pneumothorax. No acute bone abnormality. Old left rib fractures. IMPRESSION: Bilateral airspace densities in the lower lungs bilaterally. Differential diagnosis includes bilateral pneumonia. Pulmonary edema cannot be excluded. Electronically Signed   By: Markus Daft M.D.   On: 06/24/2022 08:30    Procedures .Critical Care  Performed by: Tacy Learn, PA-C Authorized by: Tacy Learn, PA-C   Critical care provider statement:    Critical care time (minutes):  30   Critical care was time  spent personally by me on the following activities:  Development of treatment plan with patient or surrogate, discussions with consultants, evaluation of patient's response to treatment, examination of patient, ordering and review of laboratory studies, ordering and review of radiographic studies, ordering and performing treatments and interventions, pulse oximetry, re-evaluation of patient's condition and review of old charts     Medications Ordered in ED Medications  insulin aspart (novoLOG) injection 0-15 Units (11 Units Subcutaneous Given 06/24/22 1125)  insulin aspart (novoLOG) injection 0-5 Units (has no administration in time range)  pantoprazole (PROTONIX) injection 40 mg (40 mg Intravenous Given 06/24/22 1106)  potassium chloride SA (KLOR-CON M) CR tablet 40 mEq (40 mEq Oral Given 06/24/22 1110)  magnesium sulfate IVPB 2 g 50 mL (2 g Intravenous New Bag/Given 06/24/22 1341)  furosemide (LASIX) injection 40 mg (40 mg Intravenous Given 06/24/22 0844)  morphine (PF) 4 MG/ML injection 4 mg (4 mg Intravenous Given 06/24/22 3810)    ED Course/ Medical Decision Making/ A&P                           Medical Decision Making Amount and/or Complexity of Data Reviewed Labs: ordered. Radiology: ordered.  Risk Prescription drug management. Decision regarding hospitalization.   This patient presents to the ED for concern of chest discomfort and shortness of breath, this involves an extensive number of treatment options, and is a complaint that carries with it a high risk of complications and morbidity.  The differential diagnosis includes but not limited to ACS, arrhythmia, CHF, PE   Co morbidities that complicate the patient evaluation  Prior MI with stents, HTN, hyperlipidemia, DM, tobacco use   Additional history obtained:  External records from outside source obtained and reviewed including admission for MI in 2012; prior labs including prior H&H   Lab Tests:  I Ordered, and personally  interpreted labs.  The pertinent results include:  CBC with hgn 8.2 (previously 12.9 1 year ago); jCMP with glucose 295 and K 3.4; BNP elevated at 639; initial troponin 139- 106 on repeat.    Imaging Studies ordered:  I ordered imaging studies including CXR  I independently visualized and interpreted imaging which showed likely vascular congestion  I agree with the radiologist interpretation- doubt PNA, no reports of fever, cough, recent URI  Cardiac Monitoring: / EKG:  The patient was maintained on a cardiac monitor.  I personally viewed and interpreted the cardiac monitored which showed an underlying rhythm of: sinus tach, rate 119, st depression laterally, changed compared to prior on file from 10/11/2019   Consultations Obtained:  I requested consultation with the cardiologist, Dr. Johney Frame,  and discussed lab and imaging findings as well as pertinent plan - they recommend: hospitalist admit to [facility], cardiology to see. Agrees on holding on nitro for how, Dr. Marlou Porch to see while in the [facility] ER if patient is not transferred beforehand.  Discussed with Dr. Marylyn Ishihara with [facility] hospitalist service who will consult for admission and transfer to [facility]. Call to Dr. Terrence Dupont with cardiology- patient has not been seen in the office since 2018, defer to on call for unassigned.    Problem List / ED Course / Critical interventions / Medication management  56 year old male presents with complaint of chest discomfort and shortness of breath as above.  On arrival, he is found to be hypoxic with O2 sat of 88% on room air, given 5 L nasal cannula improved to low 90s.  Also found to be diaphoretic, appears uncomfortable.  Chest x-ray suggesting vascular congestion.  Consider CHF versus NSTEMI.  BNP is elevated 639, initial troponin is elevated at 139, repeat is downtrending to 106.  He is found to be anemic, is not anticoagulated, is on 325 mg of aspirin daily.  He does have swelling limited to his right ankle  and foot, states that he stubbed his third toe a few days ago, this toe is tender, his calf is nontender, no palpable cords.  Patient was seen and discussed with ER attending, Dr. Roderic Palau.  Plan is to provide Lasix, morphine if needed.  Discussed with cardiology who agrees with plan to transfer to [facility] with hospitalist to admit.  Nitro held due to having taken Viagra Sunday evening. Heparin held due to drop in hgb. Patient seen by Cardiology in the ER pending transfer to [facility].  I ordered medication including lasix, morphine  for [facility] likely related to CHF, pain  Reevaluation of the patient after these medicines showed that the patient stayed the same. Patient was ambulatory to the bathroom, returned to the room [facility] with O2 sats in the upper 80s on [place]. Requested patient not exert himself pending his work up, he was provided with a urinal.  I have reviewed the patients home medicines and have made adjustments as needed   Social Determinants of Health:  Obtains care through the [place], reports he has had an echo sometime in the past, recalls hearing "CHF" but unclear   Test / Admission - Considered:  Admit for further workup          Final Clinical Impression(s) / ED Diagnoses Final diagnoses:  Acute congestive heart failure, unspecified heart failure type ([place])  Anemia, unspecified type  Elevated troponin  Nonspecific chest pain    Rx / DC Orders ED Discharge Orders     None         Tacy Learn, PA-C 06/24/22 1346    Milton Ferguson, MD 06/27/22 [phone]

## 2022-06-24 NOTE — ED Triage Notes (Signed)
Pt presents with c/o chest pain that started on Sunday. Pt reports the pain is in the center of his chest and that he has been belching since Sunday as well. Pt does report some nausea and vomiting. Pt reports a hx of a previous MI.

## 2022-06-24 NOTE — Progress Notes (Signed)
   06/24/22 2241 06/24/22 2333  Assess: MEWS Score  Temp 98.4 F (36.9 C) 98.7 F (37.1 C)  BP (!) 136/99 (!) 140/99  MAP (mmHg) 110 112  Pulse Rate  --  (!) 117  ECG Heart Rate (!) 123 (!) 115  Resp (!) 31 (!) 24  Level of Consciousness Alert  --   SpO2  --  97 %  O2 Device  --  HFNC  O2 Flow Rate (L/min)  --  10 L/min  Assess: MEWS Score  MEWS Temp 0 0  MEWS Systolic 0 0  MEWS Pulse 2 2  MEWS RR 2 1  MEWS LOC 0 0  MEWS Score 4 3  MEWS Score Color Red Yellow  Assess: if the MEWS score is Yellow or Red  Were vital signs taken at a resting state? Yes  --   Focused Assessment Change from prior assessment (see assessment flowsheet)  --   Does the patient meet 2 or more of the SIRS criteria? Yes  --   Does the patient have a confirmed or suspected source of infection? Yes  --   Provider and Rapid Response Notified? Yes  --   MEWS guidelines implemented *See Row Information* Yes  --   Treat  Pain Scale 0-10 0-10  Pain Score 7 0  Pain Type Acute pain  --   Pain Location Chest  --   Pain Descriptors / Indicators Aching;Crushing;Heaviness  --   Pain Frequency Intermittent  --   Take Vital Signs  Increase Vital Sign Frequency  Red: Q 1hr X 4 then Q 4hr X 4, if remains red, continue Q 4hrs  --   Escalate  MEWS: Escalate Red: discuss with charge nurse/RN and provider, consider discussing with RRT  --   Notify: Charge Nurse/RN  Name of Charge Nurse/RN Notified Karen, RN  --   Date Charge Nurse/RN Notified 06/24/22  --   Time Charge Nurse/RN Notified 2250  --   Notify: Provider  Provider Name/Title V. Marlowe Sax, MD  --   Date Provider Notified 06/24/22  --   Time Provider Notified 2254  --   Method of Notification Page (text paged and paged thru [place])  --   Notification Reason Change in status  --   Notify: Rapid Response  Name of Rapid Response RN Notified Shanon Brow, RN  --   Date Rapid Response Notified 06/24/22  --   Time Rapid Response Notified 2304  --   Document  Patient  Outcome Other (Comment);Stabilized after interventions (ECG performed, placed on HFNC 10L sats up to 94%)  --   Progress note created (see row info) Yes  --   Assess: SIRS CRITERIA  SIRS Temperature  0 0  SIRS Pulse 1 1  SIRS Respirations  1 1  SIRS WBC 1 1  SIRS Score Sum  3 3

## 2022-06-25 ENCOUNTER — Encounter (HOSPITAL_COMMUNITY)
Admission: EM | Disposition: A | Discharge status: Home / Self Care | Payer: Self-pay | Source: Home / Self Care | Attending: Internal Medicine

## 2022-06-25 ENCOUNTER — Inpatient Hospital Stay (HOSPITAL_COMMUNITY): Payer: No Typology Code available for payment source

## 2022-06-25 ENCOUNTER — Other Ambulatory Visit: Payer: Self-pay

## 2022-06-25 ENCOUNTER — Ambulatory Visit (HOSPITAL_COMMUNITY): Admit: 2022-06-25 | Payer: No Typology Code available for payment source | Admitting: Internal Medicine

## 2022-06-25 ENCOUNTER — Inpatient Hospital Stay (HOSPITAL_COMMUNITY): Payer: No Typology Code available for payment source | Admitting: Certified Registered"

## 2022-06-25 ENCOUNTER — Encounter (HOSPITAL_COMMUNITY): Payer: Self-pay | Admitting: Internal Medicine

## 2022-06-25 DIAGNOSIS — I10 Essential (primary) hypertension: Secondary | ICD-10-CM

## 2022-06-25 DIAGNOSIS — I214 Non-ST elevation (NSTEMI) myocardial infarction: Secondary | ICD-10-CM

## 2022-06-25 DIAGNOSIS — I081 Rheumatic disorders of both mitral and tricuspid valves: Secondary | ICD-10-CM

## 2022-06-25 DIAGNOSIS — I5021 Acute systolic (congestive) heart failure: Secondary | ICD-10-CM | POA: Diagnosis present

## 2022-06-25 DIAGNOSIS — F17210 Nicotine dependence, cigarettes, uncomplicated: Secondary | ICD-10-CM

## 2022-06-25 DIAGNOSIS — I2510 Atherosclerotic heart disease of native coronary artery without angina pectoris: Secondary | ICD-10-CM

## 2022-06-25 DIAGNOSIS — Z720 Tobacco use: Secondary | ICD-10-CM

## 2022-06-25 DIAGNOSIS — K219 Gastro-esophageal reflux disease without esophagitis: Secondary | ICD-10-CM

## 2022-06-25 DIAGNOSIS — I340 Nonrheumatic mitral (valve) insufficiency: Secondary | ICD-10-CM

## 2022-06-25 HISTORY — PX: TEE WITHOUT CARDIOVERSION: SHX5443

## 2022-06-25 HISTORY — PX: RIGHT/LEFT HEART CATH AND CORONARY ANGIOGRAPHY: CATH118266

## 2022-06-25 LAB — TROPONIN I (HIGH SENSITIVITY)
Troponin I (High Sensitivity): 1477 ng/L (ref <18)
Troponin I (High Sensitivity): 1125 ng/L (ref <18)

## 2022-06-25 LAB — BASIC METABOLIC PANEL
Anion gap: 21 — ABNORMAL HIGH (ref 5–15)
BUN: 15 mg/dL (ref 6–20)
CO2: 18 mmol/L — ABNORMAL LOW (ref 22–32)
Calcium: 9.6 mg/dL (ref 8.9–10.3)
Chloride: 95 mmol/L — ABNORMAL LOW (ref 98–111)
Creatinine, Ser: 0.89 mg/dL (ref 0.61–1.24)
GFR, Estimated: >60 mL/min (ref >60)
Glucose, Bld: 237 mg/dL — ABNORMAL HIGH (ref 70–99)
Potassium: 3.9 mmol/L (ref 3.5–5.1)
Sodium: 134 mmol/L — ABNORMAL LOW (ref 135–145)

## 2022-06-25 LAB — GLUCOSE, CAPILLARY
Glucose-Capillary: 250 mg/dL — ABNORMAL HIGH (ref 70–99)
Glucose-Capillary: 295 mg/dL — ABNORMAL HIGH (ref 70–99)
Glucose-Capillary: 244 mg/dL — ABNORMAL HIGH (ref 70–99)
Glucose-Capillary: 359 mg/dL — ABNORMAL HIGH (ref 70–99)

## 2022-06-25 LAB — COOXEMETRY PANEL
Carboxyhemoglobin: 1.4 % (ref 0.5–1.5)
Carboxyhemoglobin: 2.1 % — ABNORMAL HIGH (ref 0.5–1.5)
Methemoglobin: <0.7 % (ref 0.0–1.5)
Methemoglobin: <0.7 % (ref 0.0–1.5)
O2 Saturation: 47.4 %
O2 Saturation: 66.8 %
Total hemoglobin: <6.9 g/dL — CL (ref 12.0–16.0)
Total hemoglobin: 7.7 g/dL — ABNORMAL LOW (ref 12.0–16.0)

## 2022-06-25 LAB — LIPID PANEL
Cholesterol: 140 mg/dL (ref 0–200)
HDL: 43 mg/dL (ref >40)
LDL Cholesterol: 84 mg/dL (ref 0–99)
Total CHOL/HDL Ratio: 3.3 RATIO
Triglycerides: 66 mg/dL (ref <150)
VLDL: 13 mg/dL (ref 0–40)

## 2022-06-25 LAB — ECHO TEE
MV M vel: 3.97 m/s
MV Peak grad: 63 mmHg
Radius: 0.7 cm

## 2022-06-25 LAB — PROCALCITONIN: Procalcitonin: 0.12 ng/mL

## 2022-06-25 LAB — FERRITIN: Ferritin: 18 ng/mL — ABNORMAL LOW (ref 24–336)

## 2022-06-25 LAB — BRAIN NATRIURETIC PEPTIDE: B Natriuretic Peptide: 830.5 pg/mL — ABNORMAL HIGH (ref 0.0–100.0)

## 2022-06-25 LAB — HIV ANTIBODY (ROUTINE TESTING W REFLEX): HIV Screen 4th Generation wRfx: NONREACTIVE

## 2022-06-25 SURGERY — ECHOCARDIOGRAM, TRANSESOPHAGEAL
Anesthesia: Monitor Anesthesia Care

## 2022-06-25 SURGERY — RIGHT/LEFT HEART CATH AND CORONARY ANGIOGRAPHY
Anesthesia: LOCAL

## 2022-06-25 MED ORDER — SODIUM CHLORIDE 0.9% FLUSH: 3.0000 mL | INTRAVENOUS | Status: DC | PRN

## 2022-06-25 MED ORDER — ROSUVASTATIN CALCIUM 20 MG PO TABS
20.0000 mg | ORAL_TABLET | Freq: Every day | ORAL | Status: DC
  Administered 2022-06-25: 20 mg via ORAL
  Filled 2022-06-25: qty 1

## 2022-06-25 MED ORDER — HEPARIN (PORCINE) IN NACL 1000-0.9 UT/500ML-% IV SOLN
INTRAVENOUS | Status: AC
  Filled 2022-06-25: qty 1000

## 2022-06-25 MED ORDER — LABETALOL HCL 5 MG/ML IV SOLN
10.0000 mg | INTRAVENOUS | Status: AC | PRN
Start: 2022-06-25 — End: 2022-06-25
  Administered 2022-06-25: 10 mg via INTRAVENOUS

## 2022-06-25 MED ORDER — NITROGLYCERIN IN D5W 200-5 MCG/ML-% IV SOLN
0.0000 ug/min | INTRAVENOUS | Status: DC
  Administered 2022-06-25: 5 ug/min via INTRAVENOUS
  Filled 2022-06-25: qty 250

## 2022-06-25 MED ORDER — MIDAZOLAM HCL 2 MG/2ML IJ SOLN
INTRAMUSCULAR | Status: DC | PRN
  Administered 2022-06-25 (×2): 1 mg via INTRAVENOUS

## 2022-06-25 MED ORDER — LIDOCAINE HCL (PF) 1 % IJ SOLN
INTRAMUSCULAR | Status: DC | PRN
  Administered 2022-06-25 (×2): 2 mL via INTRADERMAL

## 2022-06-25 MED ORDER — PHENYLEPHRINE 80 MCG/ML (10ML) SYRINGE FOR IV PUSH (FOR BLOOD PRESSURE SUPPORT)
PREFILLED_SYRINGE | INTRAVENOUS | Status: DC | PRN
  Administered 2022-06-25 (×3): 80 ug via INTRAVENOUS

## 2022-06-25 MED ORDER — HYDRALAZINE HCL 20 MG/ML IJ SOLN
10.0000 mg | INTRAMUSCULAR | Status: AC | PRN
Start: 2022-06-25 — End: 2022-06-25

## 2022-06-25 MED ORDER — FUROSEMIDE 10 MG/ML IJ SOLN
INTRAMUSCULAR | Status: DC | PRN
  Administered 2022-06-25: 80 mg via INTRAVENOUS

## 2022-06-25 MED ORDER — IOHEXOL 350 MG/ML SOLN
INTRAVENOUS | Status: DC | PRN
  Administered 2022-06-25: 55 mL

## 2022-06-25 MED ORDER — MILRINONE LACTATE IN DEXTROSE 20-5 MG/100ML-% IV SOLN
0.1250 ug/kg/min | INTRAVENOUS | Status: DC
  Administered 2022-06-25: 0.125 ug/kg/min via INTRAVENOUS
  Administered 2022-06-26 – 2022-06-28 (×3): 0.25 ug/kg/min via INTRAVENOUS
  Administered 2022-07-01 – 2022-07-02 (×2): 0.125 ug/kg/min via INTRAVENOUS
  Filled 2022-06-25 (×8): qty 100

## 2022-06-25 MED ORDER — HEPARIN (PORCINE) IN NACL 1000-0.9 UT/500ML-% IV SOLN
INTRAVENOUS | Status: DC | PRN
  Administered 2022-06-25 (×2): 500 mL

## 2022-06-25 MED ORDER — GUAIFENESIN-DM 100-10 MG/5ML PO SYRP
15.0000 mL | ORAL_SOLUTION | ORAL | Status: DC | PRN
  Administered 2022-06-25 – 2022-06-28 (×2): 15 mL via ORAL
  Filled 2022-06-25 (×2): qty 15

## 2022-06-25 MED ORDER — FENTANYL CITRATE (PF) 100 MCG/2ML IJ SOLN
INTRAMUSCULAR | Status: DC | PRN
  Administered 2022-06-25: 25 ug via INTRAVENOUS

## 2022-06-25 MED ORDER — PROPOFOL 500 MG/50ML IV EMUL
INTRAVENOUS | Status: DC | PRN
  Administered 2022-06-25: 100 ug/kg/min via INTRAVENOUS

## 2022-06-25 MED ORDER — CHLORHEXIDINE GLUCONATE CLOTH 2 % EX PADS
6.0000 | MEDICATED_PAD | Freq: Every day | CUTANEOUS | Status: DC
  Administered 2022-06-25 – 2022-07-03 (×9): 6 via TOPICAL

## 2022-06-25 MED ORDER — VERAPAMIL HCL 2.5 MG/ML IV SOLN
INTRAVENOUS | Status: AC
  Filled 2022-06-25: qty 2

## 2022-06-25 MED ORDER — SODIUM CHLORIDE 0.9 % IV SOLN: INTRAVENOUS | Status: DC

## 2022-06-25 MED ORDER — OXYCODONE HCL 5 MG PO TABS: 5.0000 mg | ORAL_TABLET | ORAL | Status: DC | PRN

## 2022-06-25 MED ORDER — PHENYLEPHRINE HCL-NACL 20-0.9 MG/250ML-% IV SOLN: INTRAVENOUS | Status: DC | PRN

## 2022-06-25 MED ORDER — ONDANSETRON HCL 4 MG/2ML IJ SOLN
INTRAMUSCULAR | Status: AC
  Filled 2022-06-25: qty 2

## 2022-06-25 MED ORDER — SODIUM CHLORIDE 0.9% FLUSH
3.0000 mL | Freq: Two times a day (BID) | INTRAVENOUS | Status: DC
  Administered 2022-06-25 – 2022-07-03 (×17): 3 mL via INTRAVENOUS

## 2022-06-25 MED ORDER — FUROSEMIDE 10 MG/ML IJ SOLN
40.0000 mg | Freq: Once | INTRAMUSCULAR | Status: AC
  Administered 2022-06-25: 40 mg via INTRAVENOUS
  Filled 2022-06-25: qty 4

## 2022-06-25 MED ORDER — FENTANYL CITRATE (PF) 100 MCG/2ML IJ SOLN
INTRAMUSCULAR | Status: AC
  Filled 2022-06-25: qty 2

## 2022-06-25 MED ORDER — MIDAZOLAM HCL 2 MG/2ML IJ SOLN
INTRAMUSCULAR | Status: AC
  Filled 2022-06-25: qty 2

## 2022-06-25 MED ORDER — LIDOCAINE HCL (PF) 1 % IJ SOLN
INTRAMUSCULAR | Status: AC
  Filled 2022-06-25: qty 30

## 2022-06-25 MED ORDER — PROPOFOL 10 MG/ML IV BOLUS
INTRAVENOUS | Status: DC | PRN
  Administered 2022-06-25: 50 mg via INTRAVENOUS

## 2022-06-25 MED ORDER — FUROSEMIDE 10 MG/ML IJ SOLN
INTRAMUSCULAR | Status: AC
  Filled 2022-06-25: qty 4

## 2022-06-25 MED ORDER — HEPARIN SODIUM (PORCINE) 1000 UNIT/ML IJ SOLN
INTRAMUSCULAR | Status: AC
Start: 2022-06-25 — End: ?
  Filled 2022-06-25: qty 10

## 2022-06-25 MED ORDER — SODIUM CHLORIDE 0.9% FLUSH: 3.0000 mL | Freq: Two times a day (BID) | INTRAVENOUS | Status: DC

## 2022-06-25 MED ORDER — HEPARIN SODIUM (PORCINE) 1000 UNIT/ML IJ SOLN
INTRAMUSCULAR | Status: DC | PRN
  Administered 2022-06-25: 4000 [IU] via INTRAVENOUS

## 2022-06-25 MED ORDER — SODIUM CHLORIDE 0.9 % IV SOLN: 250.0000 mL | INTRAVENOUS | Status: DC | PRN

## 2022-06-25 MED ORDER — VERAPAMIL HCL 2.5 MG/ML IV SOLN
INTRAVENOUS | Status: DC | PRN
  Administered 2022-06-25: 10 mL via INTRA_ARTERIAL

## 2022-06-25 MED ORDER — SPIRONOLACTONE 12.5 MG HALF TABLET
12.5000 mg | ORAL_TABLET | Freq: Every day | ORAL | Status: DC
  Administered 2022-06-26 – 2022-06-29 (×4): 12.5 mg via ORAL
  Filled 2022-06-25 (×4): qty 1

## 2022-06-25 MED ORDER — LABETALOL HCL 5 MG/ML IV SOLN
INTRAVENOUS | Status: AC
Start: 2022-06-25 — End: ?
  Filled 2022-06-25: qty 4

## 2022-06-25 MED ORDER — ASPIRIN 81 MG PO CHEW: 81.0000 mg | CHEWABLE_TABLET | ORAL | Status: DC

## 2022-06-25 MED ORDER — LIDOCAINE 2% (20 MG/ML) 5 ML SYRINGE
INTRAMUSCULAR | Status: DC | PRN
  Administered 2022-06-25: 60 mg via INTRAVENOUS

## 2022-06-25 MED ORDER — SODIUM CHLORIDE 0.9 % IV SOLN
INTRAVENOUS | Status: AC | PRN
  Administered 2022-06-25: 500 mL via INTRAVENOUS

## 2022-06-25 SURGICAL SUPPLY — 14 items
BAND ZEPHYR COMPRESS 30 LONG (HEMOSTASIS) IMPLANT
CATH 5FR JL3.5 JR4 ANG PIG MP (CATHETERS) IMPLANT
CATH BALLN WEDGE 5F 110CM (CATHETERS) IMPLANT
GLIDESHEATH SLEND SS 6F .021 (SHEATH) IMPLANT
GUIDEWIRE INQWIRE 1.5J.035X260 (WIRE) IMPLANT
INQWIRE 1.5J .035X260CM (WIRE) ×1
KIT CV 3L 7FR 20CM SULFAFREE (SET/KITS/TRAYS/PACK) IMPLANT
KIT HEART LEFT (KITS) ×1 IMPLANT
KIT MICROPUNCTURE NIT STIFF (SHEATH) IMPLANT
PACK CARDIAC CATHETERIZATION (CUSTOM PROCEDURE TRAY) ×1 IMPLANT
SHEATH GLIDE SLENDER 4/5FR (SHEATH) IMPLANT
TRANSDUCER W/STOPCOCK (MISCELLANEOUS) ×1 IMPLANT
TUBING CIL FLEX 10 FLL-RA (TUBING) ×1 IMPLANT
WIRE MICROINTRODUCER 60CM (WIRE) IMPLANT

## 2022-06-25 NOTE — Hospital Course (Addendum)
[name] was admitted to the hospital with the working diagnosis of decompensated heart failure.  56 yo male with the past medical history of coronary artery disease, hypertension, and dyslipidemia who presented with chest pain. Intermittent chest pain, left sided pressure and radiated to the right. Because persistent symptoms he came to the ED. On his initial physical evaluation his blood pressure was 111/80, HR 89, RR 26 and 02 saturation 93%, lungs with rales but not wheezing, heart with S1 and S2 present and rhythmic, abdomen with no distention and positive lower extremity edema   Na 137, K 3,4 Cl 99, bicarbonate 23, glucose 295 bun 17 cr 0,87  BNP 639 High sensitive troponin 139  Wbc 10,9 hgb 8,2 plt 364   Chest radiograph with bilateral hilar vascular congestion, bilateral interstitial infiltrates more at the lower lobes, cephalization of the vasculature and small bilateral pleural effusions.   EKG 116 bpm, normal axis and normal intervals, sinus rhythm with V5 and V6 ST depression with no significant T wave changes, poor R R wave progression.   Echocardiogram with reduced LV systolic function, positive Mitral regurgitation and severe multivessel CAD and IDA   Patient has been placed on inotropic support and diuresis with furosemide  09/07 positive fever.  09/08 patient started on antibiotic therapy for pneumonia, (present on admission).  09/09 one unit PRBC transfusion with good toleration.  09/11 worsening hgb, getting the second PRBC transfusion

## 2022-06-25 NOTE — Assessment & Plan Note (Addendum)
Echocardiogram with left ventricle EF 40 to 45%, mild dilated internal cavity, moderate hypokinesis of the left ventricular, basal mid inferolateral wall and anterolateral wall. RV systolic function is preserved. LA with severe dilatation. Severe mitral regurgitation   TEE with moderate to severe mitral regurgitation   09/06 cardiac catheterization with advance multivessel coronary artery disease.  Severe mitral regurgitation.  PCWP 26 Cardiac output 5,4 per [place].  Urine output is 900 cc over last 24 hrs Blood pressure systolic 154 to 884 mmHg.  SV02 65.2  Milrinone drip rate of 0,125 mcg  Continue medical therapy with entresto, digoxin and spironolactone. Continue to hold on loop diuretic for now.    Acute hypoxemic respiratory failure.  Acute pulmonary edema.  Continue with increased 02 requirements, 02 saturation is 97% on 6 L/min per [place].

## 2022-06-25 NOTE — Consult Note (Addendum)
Advanced Heart Failure Team Consult Note   Primary Physician: Charolette Forward, MD PCP-Cardiologist:  None  Reason for Consultation: Acute Systolic Heart Failure and Severe Mitral Regurgitation   HPI:    [name] is seen today for evaluation of Acute systolic heart failure and severe mitral regurgitation at the request of Dr. Marlou Porch, Cardiology.   56 y/o AAM w/ h/o multivessel CAD s/p remote MI in 2012 and multiple PCIs to LAD, diag, ramus and OM vessels, HTN, HLD, Type 2 DM and tobacco/ETOH use. Previously followed by Dr. Terrence Dupont but lost to f/u.   Presented to ED 9/5 w/ complaints of acute onset SOB and chest pain. Ruled in for NSTEMI and CHF. Hs trop peaked 1,477. BNP 639. Echo showed moderately reduced LVEF, 40-45% and severe mitral valve dysfunction w/ severe leaflet malcoaptation due to posterior leaflet tethering, severe mitral valve regurgitation, with centrally-directed jet, severe LAE, and severely elevated RVSP 61 mmHg. RV normal size w/ normal systolic function. IVC dilated estimated RAP ~15 mmhg. TEE confirmed mod-severe MR w/ tethered posterior leaflet, felt likely ischemic MR. TEE w/ + WMAs of mid to distal anterior and apical walls, anterolateral walls and basal to mid inferior and inferolateral walls, suggestive of multivessel territory ischemia/infarct.  LHC today showed 75% stenosis of m-dLM, 90% Ost- Prox Cx, 50% pRCA and 80% mRCA.  RHC Hemodynamics mRAP 8  PAP 48/23 (38) mPCW 26  Fick CO 5.36 Fick CI 2.77  PA Sat 49% PVR 2.24  PAPi 3.1   Pt started on IV milrinone for lowing of PA pressures. Rt IJ CVC placed. AHF team consulted to assist w/ optimization.    Pt also incidentally found to be anemic and iron deficient. Admit Hgb 8.2     Cardiac Studies   2D Echo 06/24/22 Left ventricular ejection fraction, by estimation, is 40 to 45%. The left ventricle has mildly decreased function. The left ventricle demonstrates regional wall  motion abnormalities (see scoring diagram/findings for description). The left ventricular internal cavity size was mildly dilated. Left ventricular diastolic parameters are consistent with Grade III diastolic dysfunction (restrictive). Elevated left atrial pressure. There is moderate hypokinesis of the left ventricular, basal-mid inferolateral wall and anterolateral wall. 1. Right ventricular systolic function is normal. The right ventricular size is normal. There is severely elevated pulmonary artery systolic pressure. 2. 3. Left atrial size was severely dilated. 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 The mitral valve is normal in structure. Severe mitral valve regurgitation. 4. 5. The aortic valve is normal in structure. Aortic valve regurgitation is not visualized. The inferior vena cava is dilated in size with <50% respiratory variability, suggesting right atrial pressure of 15 mmHg.  TEE 06/25/22 IMPRESSION:    Moderate to severe MR, with a centro/posteriorly directed jet, likely due to ischemic tethering of the posterior leaflet and dilated annulus in the setting of severe LAE No LAA thrombus Negative for PFO by color doppler Severe LAE LVEF 40-45% with regional WMA's of the mid to distal anterior and apical walls, anterolateral walls and basal to mid inferior and inferolateral walls, suggestive of multivessel territory ischemia/infarct.   R/LHC 06/25/22   Mid LM to Dist LM lesion is 75% stenosed.   Ost Cx to Prox Cx lesion is 90% stenosed.   Mid RCA lesion is 80% stenosed.   Prox RCA lesion is 50% stenosed.   Mid LAD lesion is 75% stenosed.   Mid Cx to Dist Cx lesion is 99% stenosed.  3rd Mrg lesion is 90% stenosed.   Ramus lesion is 80% stenosed.   Dist LAD lesion is 70% stenosed.   1.  Advanced multivessel cor Neri artery disease with severe distal left main disease, very severe ostial circumflex stenosis, subtotal occlusion of the mid  circumflex with occlusion of the OM branches, severe in-stent restenosis in the mid LAD, diffuse distal LAD disease, and severe mid RCA stenosis 2.  Severe mitral regurgitation by noninvasive assessment and by hemodynamics with 34 mm V waves in the pulmonary wedge tracing 3.  Congestive heart failure with elevated wedge pressure of 26, low PA oxygen saturation of 49%, but preserved cardiac output of 5.4 L/min by Fick assessment 4.  Successful placement of right internal jugular triple-lumen catheter for infusion of milrinone and central monitoring if needed       Review of Systems: [y] = yes, '[ ]'$  = no   General: Weight gain '[ ]'$ ; Weight loss '[ ]'$ ; Anorexia '[ ]'$ ; Fatigue '[ ]'$ ; Fever '[ ]'$ ; Chills '[ ]'$ ; Weakness '[ ]'$   Cardiac: Chest pain/pressure '[ ]'$ ; Resting SOB '[ ]'$ ; Exertional SOB '[ ]'$ ; Orthopnea '[ ]'$ ; Pedal Edema '[ ]'$ ; Palpitations '[ ]'$ ; Syncope '[ ]'$ ; Presyncope '[ ]'$ ; Paroxysmal nocturnal dyspnea'[ ]'$   Pulmonary: Cough '[ ]'$ ; Wheezing'[ ]'$ ; Hemoptysis'[ ]'$ ; Sputum '[ ]'$ ; Snoring '[ ]'$   GI: Vomiting'[ ]'$ ; Dysphagia'[ ]'$ ; Melena'[ ]'$ ; Hematochezia '[ ]'$ ; Heartburn'[ ]'$ ; Abdominal pain '[ ]'$ ; Constipation '[ ]'$ ; Diarrhea '[ ]'$ ; BRBPR '[ ]'$   GU: Hematuria'[ ]'$ ; Dysuria '[ ]'$ ; Nocturia'[ ]'$   Vascular: Pain in legs with walking '[ ]'$ ; Pain in feet with lying flat '[ ]'$ ; Non-healing sores '[ ]'$ ; Stroke '[ ]'$ ; TIA '[ ]'$ ; Slurred speech '[ ]'$ ;  Neuro: Headaches'[ ]'$ ; Vertigo'[ ]'$ ; Seizures'[ ]'$ ; Paresthesias'[ ]'$ ;Blurred vision '[ ]'$ ; Diplopia '[ ]'$ ; Vision changes '[ ]'$   Ortho/Skin: Arthritis '[ ]'$ ; Joint pain '[ ]'$ ; Muscle pain '[ ]'$ ; Joint swelling '[ ]'$ ; Back Pain '[ ]'$ ; Rash '[ ]'$   Psych: Depression'[ ]'$ ; Anxiety'[ ]'$   Heme: Bleeding problems '[ ]'$ ; Clotting disorders '[ ]'$ ; Anemia '[ ]'$   Endocrine: Diabetes '[ ]'$ ; Thyroid dysfunction'[ ]'$   Home Medications Prior to Admission medications   Medication Sig Start Date End Date Taking? Authorizing Provider  aspirin 325 MG tablet Take 325 mg by mouth daily.   Yes [provider]  Calcium Polycarbophil (FIBER) 625 MG TABS Take 2 tablets by  mouth daily. 04/15/22  Yes [provider]  carvedilol (COREG) 25 MG tablet Take 25 mg by mouth 2 (two) times daily with a meal.   Yes [provider]  empagliflozin (JARDIANCE) 25 MG TABS tablet Take 0.5 tablets by mouth daily. 12/27/21  Yes [provider]  glipiZIDE (GLUCOTROL) 5 MG tablet Take 5 mg by mouth daily. 30 minutes prior to a meal   Yes [provider]  insulin glargine (LANTUS) 100 UNIT/ML Solostar Pen Inject 50 Units into the skin at bedtime.   Yes [provider]  lisinopril (ZESTRIL) 10 MG tablet Take 40 mg by mouth daily. 06/19/22  Yes [provider]  metFORMIN (GLUCOPHAGE-XR) 500 MG 24 hr tablet Take 500 mg by mouth 2 (two) times daily with a meal. 06/19/22  Yes [provider]  ondansetron (ZOFRAN) 8 MG tablet Take 4 mg by mouth 3 (three) times daily as needed for nausea. 05/27/22  Yes [provider]  sildenafil (VIAGRA) 100 MG tablet Take 50 mg by mouth daily as needed for erectile dysfunction.   Yes [provider]  simvastatin (ZOCOR) 40 MG tablet Take 80 mg by mouth at bedtime. 12/27/21  Yes [provider]  Vitamin D, Ergocalciferol, (DRISDOL) 1.25 MG (50000 UNIT) CAPS capsule Take 50,000 Units by mouth every 7 (seven) days.   Yes [provider]    Past Medical History: Past Medical History:  Diagnosis Date   Acid reflux    Concussion    Coronary artery disease    Diabetes ([place])    ETOH abuse    High cholesterol    Hypertension    MI (myocardial infarction) ([place])    2012   Sleep apnea    USES CPAP AS NEEDED   Tobacco abuse     Past Surgical History: Past Surgical History:  Procedure Laterality Date   KNEE ARTHROSCOPY     ORIF ULNAR FRACTURE Right 12/31/2016   Procedure: OPEN REDUCTION INTERNAL FIXATION (ORIF) both bone forearm fracture;  Surgeon: Roseanne Kaufman, MD;  Location: [facility];  Service: Orthopedics;  Laterality: Right;   stents      Family  History: Family History  Problem Relation Age of Onset   Diabetes Mother     Social History: Social History   Socioeconomic History   Marital status: Divorced    Spouse name: Not on file   Number of children: Not on file   Years of education: Not on file   Highest education level: Not on file  Occupational History   Not on file  Tobacco Use   Smoking status: Every Day    Packs/day: 0.25    Years: 30.00    Total pack years: 7.50    Types: Cigarettes   Smokeless tobacco: Never  Vaping Use   Vaping Use: Never used  Substance and Sexual Activity   Alcohol use: Yes    Alcohol/week: 6.0 standard drinks of alcohol    Types: 6 Cans of beer per week    Comment: occassionally on weekends   Drug use: No   Sexual activity: Not on file  Other Topics Concern   Not on file  Social History Narrative   Not on file   Social Determinants of Health   Financial Resource Strain: Not on file  Food Insecurity: No Food Insecurity (06/24/2022)   Hunger Vital Sign    Worried About Running Out of Food in the Last Year: Never true    Ran Out of Food in the Last Year: Never true  Transportation Needs: No Transportation Needs (06/24/2022)   PRAPARE - Hydrologist (Medical): No    Lack of Transportation (Non-Medical): No  Physical Activity: Not on file  Stress: Not on file  Social Connections: Not on file    Allergies:  No Known Allergies  Objective:    Vital Signs:   Temp:  [98 F (36.7 C)-98.7 F (37.1 C)] 98 F (36.7 C) (09/06 1330) Pulse Rate:  [85-118] 103 (09/06 1603) Resp:  [18-32] 26 (09/06 1603) BP: (96-151)/(64-107) 120/70 (09/06 1603) SpO2:  [86 %-100 %] 94 % (09/06 1603) Weight:  [72.2 kg-72.9 kg] 72.2 kg (09/06 1329)    Weight change: Filed Weights   06/25/22 0116 06/25/22 0625 06/25/22 1329  Weight: 72.9 kg 72.2 kg 72.2 kg    Intake/Output:   Intake/Output Summary (Last 24 hours) at 06/25/2022 1611 Last data filed at 06/25/2022  1331 Gross per 24 hour  Intake 440 ml  Output 800 ml  Net -360 ml      Physical Exam  General:  Well appearing. No resp difficulty HEENT: normal Neck: supple. JVP 9 cm . + Rt IJ CVC, Carotids 2+ bilat; no bruits. No lymphadenopathy or thyromegaly appreciated. Cor: PMI nondisplaced. Regular rate & rhythm. 3/6 MR  Lungs: decreased BS at the bases  Abdomen: soft, nontender, nondistended. No hepatosplenomegaly. No bruits or masses. Good bowel sounds. Extremities: no cyanosis, clubbing, rash, 1+ b/l LE edema Neuro: alert & orientedx3, cranial nerves grossly intact. moves all 4 extremities w/o difficulty. Affect pleasant   Telemetry   NSR 90s   EKG    Admit EKG, sinus tach 113 bpm, nonspecific ST segment changes   Labs   Basic Metabolic Panel: Recent Labs  Lab 06/24/22 0809 06/24/22 1143 06/25/22 0641  NA 137  --  134*  K 3.4*  --  3.9  CL 99  --  95*  CO2 23  --  18*  GLUCOSE 295*  --  237*  BUN 17  --  15  CREATININE 0.87  --  0.89  CALCIUM 10.2  --  9.6  MG  --  1.5*  --     Liver Function Tests: No results for input(s): "AST", "ALT", "ALKPHOS", "BILITOT", "PROT", "ALBUMIN" in the last 168 hours. No results for input(s): "LIPASE", "AMYLASE" in the last 168 hours. No results for input(s): "AMMONIA" in the last 168 hours.  CBC: Recent Labs  Lab 06/24/22 0809 06/24/22 1143 06/24/22 1734  WBC 10.9*  --   --   HGB 8.2* 8.1* 8.4*  HCT 28.5* 28.0* 28.7*  MCV 77.0*  --   --   PLT 364  --   --     Cardiac Enzymes: No results for input(s): "CKTOTAL", "CKMB", "CKMBINDEX", "TROPONINI" in the last 168 hours.  BNP: BNP (last 3 results) Recent Labs    06/24/22 0809 06/25/22 0133  BNP 639.0* 830.5*    ProBNP (last 3 results) No results for input(s): "PROBNP" in the last 8760 hours.   CBG: Recent Labs  Lab 06/24/22 1119 06/24/22 1620 06/24/22 2133 06/25/22 0618 06/25/22 1058  GLUCAP 325* 123* 244* 250* 295*    Coagulation Studies: No results  for input(s): "LABPROT", "INR" in the last 72 hours.   Imaging   ECHO TEE  Result Date: 06/25/2022    TRANSESOPHOGEAL ECHO REPORT   Patient Name:   [name] Date of Exam: 06/25/2022 Medical Rec #:  [id number]            Height:       72.0 in Accession #:    [id number]           Weight:       159.2 lb Date of Birth:  1966-09-01             BSA:          1.934 m Patient Age:    27 years             BP:           104/80 mmHg Patient Gender: M                    HR:           90 bpm. Exam Location:  Inpatient Procedure: Transesophageal Echo, Cardiac Doppler, Color Doppler and 3D Echo Indications:    Mitral regurgitation  History:        Patient has prior history of Echocardiogram examinations, most  recent 06/24/2022. Previous Myocardial Infarction and CAD; Risk                 Factors:Current Smoker, Sleep Apnea, Hypertension, ETOH and                 Diabetes.  Sonographer:    Eartha Inch Referring Phys: [id number] MARK C SKAINS PROCEDURE: After discussion of the risks and benefits of a TEE, an informed consent was obtained from the patient. The transesophogeal probe was passed without difficulty through the esophogus of the patient. Imaged were obtained with the patient in a supine position. Sedation performed by different physician. The patient was monitored while under deep sedation. Anesthestetic sedation was provided intravenously by Anesthesiology: 133.'03mg'$  of Propofol, '60mg'$  of Lidocaine. Image quality was good. The patient's vital signs; including heart rate, blood pressure, and oxygen saturation; remained stable throughout the procedure. The patient developed no complications during the procedure. IMPRESSIONS  1. Left ventricular ejection fraction, by estimation, is 40 to 45%. The left ventricle has mildly decreased function. The left ventricle demonstrates regional wall motion abnormalities (see scoring diagram/findings for description). There is mild left ventricular hypertrophy.  There is severe hypokinesis of the left ventricular, mid-apical anterior wall, anterolateral wall and apical segment. There is moderate hypokinesis of the left ventricular, basal-mid inferior wall and inferolateral wall.  2. Right ventricular systolic function is normal. The right ventricular size is normal.  3. Left atrial size was severely dilated. No left atrial/left atrial appendage thrombus was detected.  4. Tethering of the posterior leaflet is suspected. The mitral valve is abnormal. Moderate to severe mitral valve regurgitation.  5. The aortic valve is tricuspid. Aortic valve regurgitation is not visualized. FINDINGS  Left Ventricle: Left ventricular ejection fraction, by estimation, is 40 to 45%. The left ventricle has mildly decreased function. The left ventricle demonstrates regional wall motion abnormalities. Severe hypokinesis of the left ventricular, mid-apical  anterior wall, anterolateral wall and apical segment. Moderate hypokinesis of the left ventricular, basal-mid inferior wall and inferolateral wall. The left ventricular internal cavity size was normal in size. There is mild left ventricular hypertrophy. Right Ventricle: The right ventricular size is normal. No increase in right ventricular wall thickness. Right ventricular systolic function is normal. Left Atrium: Left atrial size was severely dilated. No left atrial/left atrial appendage thrombus was detected. Right Atrium: Right atrial size was normal in size. Pericardium: Trivial pericardial effusion is present. The pericardial effusion is posterior to the left ventricle. Mitral Valve: Tethering of the posterior leaflet is suspected. The mitral valve is abnormal. There is mild thickening of the anterior and posterior mitral valve leaflet(s). Moderate to severe mitral valve regurgitation, with centro/posteriorly directed. Tricuspid Valve: The tricuspid valve is grossly normal. Tricuspid valve regurgitation is mild. Aortic Valve: The aortic  valve is tricuspid. Aortic valve regurgitation is not visualized. Pulmonic Valve: The pulmonic valve was grossly normal. Pulmonic valve regurgitation is not visualized. Aorta: The aortic root and ascending aorta are structurally normal, with no evidence of dilitation. IAS/Shunts: No atrial level shunt detected by color flow Doppler.  MR Peak grad:    63.0 mmHg MR Mean grad:    43.0 mmHg MR Vmax:         397.00 cm/s MR Vmean:        303.0 cm/s MR PISA:         3.08 cm MR PISA Eff ROA: 24 mm MR PISA Radius:  0.70 cm Lyman Bishop MD Electronically signed by Lyman Bishop MD Signature Date/Time:  06/25/2022/3:08:05 PM    Final    DG CHEST PORT 1 VIEW  Result Date: 06/25/2022 CLINICAL DATA:  Chest pain, hypoxia EXAM: PORTABLE CHEST 1 VIEW COMPARISON:  06/24/2022 FINDINGS: Bilateral perihilar and bibasilar pulmonary infiltrates are again seen, favoring edema though infection could appear similarly. Small right pleural effusion is slightly increased in the interval. Pulmonary insufflation has slightly diminished in the interval since prior examination. No pneumothorax. Cardiac size within normal limits. No acute bone abnormality. IMPRESSION: 1. Stable bilateral perihilar and bibasilar pulmonary infiltrates, favoring edema though infection could appear similarly. Slight interval enlargement of small right pleural effusion. Electronically Signed   By: Fidela Salisbury M.D.   On: 06/25/2022 00:56     Medications:     Current Medications:  [START ON 06/26/2022] aspirin  81 mg Oral Pre-Cath   [MAR Hold] aspirin  325 mg Oral Daily   [MAR Hold] carvedilol  25 mg Oral BID WC   [MAR Hold] furosemide  40 mg Intravenous BID   [MAR Hold] insulin aspart  0-15 Units Subcutaneous TID WC   [MAR Hold] insulin aspart  0-5 Units Subcutaneous QHS   [MAR Hold] lisinopril  40 mg Oral Daily   [MAR Hold] pantoprazole (PROTONIX) IV  40 mg Intravenous Q24H   [MAR Hold] potassium chloride  40 mEq Oral Daily   [MAR Hold] rosuvastatin   20 mg Oral Daily   sodium chloride flush  3 mL Intravenous Q12H    Infusions:  sodium chloride     sodium chloride     sodium chloride     [MAR Hold] nitroGLYCERIN Stopped (06/25/22 1506)      Patient Profile   56 y/o AAM w/ h/o multivessel CAD s/p remote MI in 2012 and multiple PCIs to LAD, diag, ramus and OM vessels, HTN, HLD, Type 2 DM and tobacco use, admitted for NSTEMI and acute CHF. 2D Echo and TEE w/ severe mitral regurgitation. LVEF 40-45%, RV ok. Cath w/ multivessel CAD including high grade LM disease, elevated filling pressures and preserved CO.   Assessment/Plan   Severe Mitral Regurgitation  - ischemic MR in setting of severe multivessel CAD  - TEE w/ 3+ MR, severe leaflet malcoaptation due to posterior leaflet tethering - RHC w/ elevated PAP and preserved CO. RV ok on echo. RHC PAPi 3  - start milrinone 0.125 mcg to help w/ lowering of PA pressures - diurese w/ IV Lasix, follow co-ox and CVPs  - add spiro 12.5 mg daily  - on lisinopril 40 mg PTA. Transition to losartan. Eventual Entresto  - plan cMRI once better stabilized to check viability. If viable myocardium, will consult CT surgery for CABG + MVR   2. NSTEMI w/ Multivessel CAD  - HS trop peak 1,477 - LHC w/ severe MVCAD including high grade LM disease  - ASA + high intensity statin, heparin per cardiology   - cMRI for viability study for CABG evaluation   3. Type 2DM  - per primary team   4. IDA - Hgb 8.4 - per primary team - hold off on giving feraheme until completion of cardiac MRI      Length of Stay: Nellie, PA-C  06/25/2022, 4:11 PM  Advanced Heart Failure Team Pager [phone] (M-F; 7a - 5p)  Please contact [facility] for night-coverage after hours (4p -7a ) and weekends on [email or web address]  Patient seen and examined with the above-signed Advanced Practice Provider and/or Housestaff. I personally reviewed laboratory data, imaging studies and relevant notes.  I independently  examined the patient and formulated the important aspects of the plan. I have edited the note to reflect any of my changes or salient points. I have personally discussed the plan with the patient and/or family.  56 y/o as above now admitted with progressive HF symptoms w/o has revealed EF 35-40% 3+ MR and severe multivessel CAD and IDA  Hemodynamics c/w volume overload significant MR and low output.   Remains SOB and orthopneic. Denies CP. Apparently smokes and drinks heavily.   General:  Sitting up in bed COB at rest HEENT: normal Neck: supple. JVP to jaw RI\J TLC. Carotids 2+ bilat; no bruits. No lymphadenopathy or thryomegaly appreciated. Cor: PMI nondisplaced. Tachy regular 3/6 MR Lungs: c+ crackles Abdomen: soft, nontender, nondistended. No hepatosplenomegaly. No bruits or masses. Good bowel sounds. Extremities: no cyanosis, clubbing, rash, edema cool  Neuro: alert & orientedx3, cranial nerves grossly intact. moves all 4 extremities w/o difficulty. Affect pleasant  Difficult situation. Will start milrinone and IV diuresis. Titrate GDMT. Follow CVP and co-ox. Once tuned up will get cMRI. If anterior wall viable will discuss possible MVR/LIMA/SVG-PDA with TCTS. Watch for ETOH withdrawal.   Glori Bickers, MD  5:13 PM

## 2022-06-25 NOTE — Inpatient Diabetes Management (Signed)
Inpatient Diabetes Program Recommendations  AACE/ADA: New Consensus Statement on Inpatient Glycemic Control (2015)  Target Ranges:  Prepandial:   less than 140 mg/dL      Peak postprandial:   less than 180 mg/dL (1-2 hours)      Critically ill patients:  140 - 180 mg/dL    Latest Reference Range & Units 06/24/22 11:19 06/24/22 16:20 06/24/22 21:33 06/25/22 06:18  Glucose-Capillary 70 - 99 mg/dL 325 (H)  Novolog 11 units 123 (H)  Novolog 2 units 244 (H)  Novolog 2 units 250 (H)  Novolog 5 units    Home DM Meds:  Lantus 50 QHS  Jardiance 12.5 mg QD  Glipizide 5 mg QD  Metformin 500 mg BID    Current Orders: Novolog 0-15 units TID ac/hs     MD- Note pt takes Lantus 50 units QHS at home  CBG 250 this am  -  Consider starting a portion of pt home dose of basal insulin, Semglee 15 units    --Will follow patient during hospitalization--  Tama Headings RN, MSN, BC-ADM Inpatient Diabetes Coordinator Team Pager [phone] (8a-5p)

## 2022-06-25 NOTE — Assessment & Plan Note (Signed)
Smoking cessation  

## 2022-06-25 NOTE — Progress Notes (Signed)
Date and time results received: 06/25/22 0230  Test: Troponin  Critical Value: 1477  Name of Provider Notified: Margaretha Sheffield, MD

## 2022-06-25 NOTE — Progress Notes (Signed)
Progress Note   Patient: [name] IRW:[id number] DOB: [birth year] DOA: 06/24/2022     1 DOS: the patient was seen and examined on 06/25/2022   Brief hospital course: Mr. Conant was admitted to the hospital with the working diagnosis of decompensated heart failure.  56 yo male with the past medical history of coronary artery disease, hypertension, and dyslipidemia who presented with chest pain. Intermittent chest pain, left sided pressure and radiated to the right. Because persistent symptoms he came to the ED. On his initial physical evaluation his blood pressure was 111/80, HR 89, RR 26 and 02 saturation 93%, lungs with rales but not wheezing, heart with S1 and S2 present and rhythmic, abdomen with no distention and positive lower extremity edema   Na 137, K 3,4 Cl 99, bicarbonate 23, glucose 295 bun 17 cr 0,87  BNP 639 High sensitive troponin 139  Wbc 10,9 hgb 8,2 plt 364   Chest radiograph with bilateral hilar vascular congestion, bilateral interstitial infiltrates more at the lower lobes, cephalization of the vasculature and small bilateral pleural effusions.   EKG 116 bpm, normal axis and normal intervals, sinus rhythm with V5 and V6 ST depression with no significant T wave changes, poor R R wave progression.   Echocardiogram with reduced LV systolic function, positive Mitral regurgitation and severe multivessel CAD and IDA   Patient has been placed on inotropic support and diuresis with furosemide   Assessment and Plan: Acute systolic heart failure (HCC) Echocardiogram with left ventricle EF 40 to 45%, mild dilated internal cavity, moderate hypokinesis of the left ventricular, basal mid inferolateral wall and anterolateral wall. RV systolic function is preserved. LA with severe dilatation. Severe mitral regurgitation   TEE with moderate to severe mitral regurgitation   09/06 cardiac catheterization with advance multivessel coronary artery disease.  Severe mitral  regurgitation.  PCWP 26 Cardiac output 5,4 per Fick.  Patient had central line placed and started on milrinone for inotropic support.  Continue diuresis with furosemide 40 mg IV bid  Spironolactone  Afterload reduction with losartan.   CAD (coronary artery disease) Multivessel coronary artery disease Patient with no chest pain, continue with statin therapy and aspirin.   Hypertension Continue blood pressure support with milrinone  Afterload reduction with lisinopril, may change to ARB or entresto during his hospitalization.   Type 2 diabetes mellitus with hyperlipidemia (HCC) Continue insulin sliding scale for glucose cover and monitoring Continue with statin therapy  GERD (gastroesophageal reflux disease) Continue with pantoprazole  Microcytic anemia Cell count stable   Tobacco abuse Smoking cessation         Subjective: Patient with improvement in dyspnea but not back to baseline, continue to have edema, no chest pain   Physical Exam: Vitals:   06/25/22 1725 06/25/22 1735 06/25/22 1800 06/25/22 1931  BP: 100/63 106/72 112/80 114/69  Pulse: 98 (!) 108 99 (!) 111  Resp: (!) 26 (!) 25 (!) 25 (!) 28  Temp:   97.9 F (36.6 C) 98.1 F (36.7 C)  TempSrc:   Oral Oral  SpO2: 95% 93%    Weight:      Height:       Neurology awake and alert ENT with mild pallor Cardiovascular with S1 and S2 present and rhythmic, positive systolic murmur at the apex Positive JVD Positive lower extremity edema more right than left Respiratory with basilar rales but not wheezing  Abdomen with no distention  Data Reviewed:   Family Communication: no family at the bedside   Disposition:  Status is: Inpatient Remains inpatient appropriate because: heart failure   Planned Discharge Destination: Home     Author: Tawni Millers, MD 06/25/2022 11:15 PM  For on call review [email or web address].

## 2022-06-25 NOTE — Progress Notes (Signed)
Rounding Note    Patient Name: [name] Date of Encounter: 06/25/2022  [place] Cardiologist: None Raahi Korber  Subjective   Feeling better this morning breathing better.  On 2 to 3 L of oxygen.  No bowel movement since hospitalization.  No hematemesis no signs of active bleeding.  Previously reported alcohol about 6 drinks per week.  Also has a smoking history as well.  Inpatient Medications    Scheduled Meds:  aspirin  325 mg Oral Daily   carvedilol  25 mg Oral BID WC   furosemide  40 mg Intravenous BID   insulin aspart  0-15 Units Subcutaneous TID WC   insulin aspart  0-5 Units Subcutaneous QHS   lisinopril  40 mg Oral Daily   pantoprazole (PROTONIX) IV  40 mg Intravenous Q24H   potassium chloride  40 mEq Oral Daily   rosuvastatin  20 mg Oral Daily   Continuous Infusions:  nitroGLYCERIN 5 mcg/min (06/25/22 0533)   PRN Meds: acetaminophen, guaiFENesin-dextromethorphan, ondansetron (ZOFRAN) IV   Vital Signs    Vitals:   06/25/22 0532 06/25/22 0546 06/25/22 0625 06/25/22 0824  BP: 134/87 123/81  100/64  Pulse: (!) 112 (!) 105  94  Resp:  20  20  Temp:  98.3 F (36.8 C)  98.4 F (36.9 C)  TempSrc:  Oral  Oral  SpO2: 97% 99%  100%  Weight:   72.2 kg   Height:        Intake/Output Summary (Last 24 hours) at 06/25/2022 0916 Last data filed at 06/25/2022 0825 Gross per 24 hour  Intake 290 ml  Output 800 ml  Net -510 ml      06/25/2022    6:25 AM 06/25/2022    1:16 AM 01/03/2021    9:18 PM  Last 3 Weights  Weight (lbs) 159 lb 3.2 oz 160 lb 12.8 oz 145 lb  Weight (kg) 72.213 kg 72.938 kg 65.772 kg      Telemetry    No adverse arrhythmias, sinus rhythm/sinus tachycardia- Personally Reviewed  ECG    06/25/2022-sinus tachycardia 113 at 2 AM.  Nonspecific ST segment changes currently in the 80s and 90s personally Reviewed  Physical Exam   GEN: No acute distress.   Neck: No JVD Cardiac: RRR, 2/6 holosystolic murmur apex, no rubs, or gallops.   Respiratory: Clear to auscultation bilaterally. GI: Soft, nontender, non-distended  MS: No edema; No deformity. Neuro:  Nonfocal  Psych: Normal affect   Labs    High Sensitivity Troponin:   Recent Labs  Lab 06/24/22 0809 06/24/22 1006 06/25/22 0028 06/25/22 0641  TROPONINIHS 139* 106* 1,477* 1,125*     Chemistry Recent Labs  Lab 06/24/22 0809 06/24/22 1143  NA 137  --   K 3.4*  --   CL 99  --   CO2 23  --   GLUCOSE 295*  --   BUN 17  --   CREATININE 0.87  --   CALCIUM 10.2  --   MG  --  1.5*  GFRNONAA >60  --   ANIONGAP 15  --     Lipids  Recent Labs  Lab 06/25/22 0028  CHOL 140  TRIG 66  HDL 43  LDLCALC 84  CHOLHDL 3.3    Hematology Recent Labs  Lab 06/24/22 0809 06/24/22 1143 06/24/22 1734  WBC 10.9*  --   --   RBC 3.70*  --   --   HGB 8.2* 8.1* 8.4*  HCT 28.5* 28.0* 28.7*  MCV  77.0*  --   --   MCH 22.2*  --   --   MCHC 28.8*  --   --   RDW 21.1*  --   --   PLT 364  --   --    Thyroid No results for input(s): "TSH", "FREET4" in the last 168 hours.  BNP Recent Labs  Lab 06/24/22 0809 06/25/22 0133  BNP 639.0* 830.5*    DDimer No results for input(s): "DDIMER" in the last 168 hours.   Radiology    DG CHEST PORT 1 VIEW  Result Date: 06/25/2022 CLINICAL DATA:  Chest pain, hypoxia EXAM: PORTABLE CHEST 1 VIEW COMPARISON:  06/24/2022 FINDINGS: Bilateral perihilar and bibasilar pulmonary infiltrates are again seen, favoring edema though infection could appear similarly. Small right pleural effusion is slightly increased in the interval. Pulmonary insufflation has slightly diminished in the interval since prior examination. No pneumothorax. Cardiac size within normal limits. No acute bone abnormality. IMPRESSION: 1. Stable bilateral perihilar and bibasilar pulmonary infiltrates, favoring edema though infection could appear similarly. Slight interval enlargement of small right pleural effusion. Electronically Signed   By: Fidela Salisbury M.D.   On:  06/25/2022 00:56   VAS [place] LOWER EXTREMITY VENOUS (DVT)  Result Date: 06/24/2022  Lower Venous DVT Study Patient Name:  [name]  Date of Exam:   06/24/2022 Medical Rec #: [id number]             Accession #:    [id number] Date of Birth: 05-03-1966              Patient Gender: M Patient Age:   56 years Exam Location:  [facility] Procedure:      VAS [place] LOWER EXTREMITY VENOUS (DVT) Referring Phys: Cherylann Ratel --------------------------------------------------------------------------------  Indications: Swelling.  Risk Factors: None identified. Comparison Study: No prior studies. Performing Technologist: Oliver Hum RVT  Examination Guidelines: A complete evaluation includes B-mode imaging, spectral Doppler, color Doppler, and power Doppler as needed of all accessible portions of each vessel. Bilateral testing is considered an integral part of a complete examination. Limited examinations for reoccurring indications may be performed as noted. The reflux portion of the exam is performed with the patient in reverse Trendelenburg.  +---------+---------------+---------+-----------+----------+--------------+ RIGHT    CompressibilityPhasicitySpontaneityPropertiesThrombus Aging +---------+---------------+---------+-----------+----------+--------------+ CFV      Full           Yes      Yes                                 +---------+---------------+---------+-----------+----------+--------------+ SFJ      Full                                                        +---------+---------------+---------+-----------+----------+--------------+ FV Prox  Full                                                        +---------+---------------+---------+-----------+----------+--------------+ FV Mid   Full                                                        +---------+---------------+---------+-----------+----------+--------------+  FV DistalFull                                                         +---------+---------------+---------+-----------+----------+--------------+ PFV      Full                                                        +---------+---------------+---------+-----------+----------+--------------+ POP      Full           Yes      Yes                                 +---------+---------------+---------+-----------+----------+--------------+ PTV      Full                                                        +---------+---------------+---------+-----------+----------+--------------+ PERO     Full                                                        +---------+---------------+---------+-----------+----------+--------------+   +----+---------------+---------+-----------+----------+--------------+ LEFTCompressibilityPhasicitySpontaneityPropertiesThrombus Aging +----+---------------+---------+-----------+----------+--------------+ CFV Full           Yes      Yes                                 +----+---------------+---------+-----------+----------+--------------+     Summary: RIGHT: - There is no evidence of deep vein thrombosis in the lower extremity.  - No cystic structure found in the popliteal fossa.  LEFT: - No evidence of common femoral vein obstruction.  *See table(s) above for measurements and observations. Electronically signed by Deitra Mayo MD on 06/24/2022 at 6:00:28 PM.    Final    ECHOCARDIOGRAM COMPLETE  Result Date: 06/24/2022    ECHOCARDIOGRAM REPORT   Patient Name:   [name] Date of Exam: 06/24/2022 Medical Rec #:  [id number]            Height:       71.0 in Accession #:    [id number]           Weight:       145.0 lb Date of Birth:  [birth year]             BSA:          1.839 m Patient Age:    56 years             BP:           106/77 mmHg Patient Gender: M                    HR:           96 bpm. Exam Location:  Inpatient Procedure: 2D Echo, Cardiac Doppler and Color Doppler Indications:     Elevated troponin  History:        Patient has no prior history of Echocardiogram examinations.                 CAD; Risk Factors:Diabetes, Hypertension and Dyslipidemia.  Sonographer:    Memory Argue Referring Phys: [id number] [place]  1. Left ventricular ejection fraction, by estimation, is 40 to 45%. The left ventricle has mildly decreased function. The left ventricle demonstrates regional wall motion abnormalities (see scoring diagram/findings for description). The left ventricular  internal cavity size was mildly dilated. Left ventricular diastolic parameters are consistent with Grade III diastolic dysfunction (restrictive). Elevated left atrial pressure. There is moderate hypokinesis of the left ventricular, basal-mid inferolateral wall and anterolateral wall.  2. Right ventricular systolic function is normal. The right ventricular size is normal. There is severely elevated pulmonary artery systolic pressure.  3. Left atrial size was severely dilated.  4. 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 The mitral valve is normal in structure. Severe mitral valve regurgitation.  5. The aortic valve is normal in structure. Aortic valve regurgitation is not visualized.  6. The inferior vena cava is dilated in size with <50% respiratory variability, suggesting right atrial pressure of 15 mmHg. FINDINGS  Left Ventricle: Left ventricular ejection fraction, by estimation, is 40 to 45%. The left ventricle has mildly decreased function. The left ventricle demonstrates regional wall motion abnormalities. Moderate hypokinesis of the left ventricular, basal-mid inferolateral wall and anterolateral wall. The left ventricular internal cavity size was mildly dilated. There is no left ventricular hypertrophy. Left ventricular diastolic parameters are consistent with Grade III diastolic dysfunction (restrictive). Elevated left atrial pressure. Right Ventricle: The right ventricular  size is normal. No increase in right ventricular wall thickness. Right ventricular systolic function is normal. There is severely elevated pulmonary artery systolic pressure. The tricuspid regurgitant velocity is 3.40 m/s, and with an assumed right atrial pressure of 15 mmHg, the estimated right ventricular systolic pressure is 45.4 mmHg. Left Atrium: Left atrial size was severely dilated. Right Atrium: Right atrial size was normal in size. Prominent Eustachian valve. Pericardium: There is no evidence of pericardial effusion. 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 The mitral valve is normal in structure. There is mild thickening of the mitral valve leaflet(s). Severe mitral valve regurgitation, with centrally-directed jet. Tricuspid Valve: The tricuspid valve is normal in structure. Tricuspid valve regurgitation is mild. Aortic Valve: The aortic valve is normal in structure. Aortic valve regurgitation is not visualized. Aortic valve mean gradient measures 2.0 mmHg. Aortic valve peak gradient measures 4.6 mmHg. Aortic valve area, by VTI measures 3.60 cm. Pulmonic Valve: The pulmonic valve was not well visualized. Pulmonic valve regurgitation is not visualized. Aorta: The aortic root and ascending aorta are structurally normal, with no evidence of dilitation. Venous: The inferior vena cava is dilated in size with less than 50% respiratory variability, suggesting right atrial pressure of 15 mmHg. IAS/Shunts: No atrial level shunt detected by color flow Doppler.  LEFT VENTRICLE PLAX 2D LVIDd:         5.90 cm      Diastology LVIDs:         4.50 cm      LV e' medial:    8.16 cm/s LV PW:         0.80 cm      LV E/e' medial:  21.1 LV IVS:        0.80 cm      LV e' lateral:   8.49 cm/s LVOT diam:     2.20 cm      LV E/e' lateral: 20.3 LV SV:         55 LV SV Index:   30 LVOT Area:     3.80 cm  LV Volumes (MOD) LV vol d, MOD A2C: 227.0 ml LV vol d, MOD A4C: 214.0 ml LV vol s,  MOD A2C: 143.0 ml LV vol s, MOD A4C: 127.0 ml LV SV MOD A2C:     84.0 ml LV SV MOD A4C:     214.0 ml LV SV MOD BP:      90.9 ml RIGHT VENTRICLE RV S prime:     11.20 cm/s TAPSE (M-mode): 1.9 cm LEFT ATRIUM            Index        RIGHT ATRIUM           Index LA Vol (A4C): 106.0 ml 57.63 ml/m  RA Area:     11.70 cm                                     RA Volume:   30.40 ml  16.53 ml/m  AORTIC VALVE AV Area (Vmax):    3.59 cm AV Area (Vmean):   3.39 cm AV Area (VTI):     3.60 cm AV Vmax:           107.00 cm/s AV Vmean:          69.800 cm/s AV VTI:            0.154 m AV Peak Grad:      4.6 mmHg AV Mean Grad:      2.0 mmHg LVOT Vmax:         101.00 cm/s LVOT Vmean:        62.200 cm/s LVOT VTI:          0.146 m LVOT/AV VTI ratio: 0.95  AORTA Ao Root diam: 3.10 cm Ao Asc diam:  3.10 cm MITRAL VALVE                 TRICUSPID VALVE MV Area (PHT): 8.72 cm      TR Peak grad:   46.2 mmHg MV Decel Time: 87 msec       TR Vmax:        340.00 cm/s MR Peak grad:   105.9 mmHg MR Mean grad:   70.0 mmHg    SHUNTS MR Vmax:        514.50 cm/s  Systemic VTI:  0.15 m MR Vmean:       390.0 cm/s   Systemic Diam: 2.20 cm MR PISA:        1.01 cm MR PISA Radius: 0.40 cm MV E velocity: 172.00 cm/s MV A velocity: 65.00 cm/s MV E/A ratio:  2.65 Mihai Croitoru MD Electronically signed by Sanda Klein MD Signature Date/Time: 06/24/2022/3:20:26 PM    Final    DG Chest 2 View  Result Date: 06/24/2022 CLINICAL DATA:  Chest pain. EXAM: CHEST - 2 VIEW COMPARISON:  Chest radiograph 12/29/2019 FINDINGS: Two views of the chest demonstrate patchy bilateral airspace densities in the lower lungs. Central vascular structures are slightly prominent and there may be mild peribronchial thickening. Heart size is within normal limits.  No large pleural effusions. Trachea is midline. Negative for a pneumothorax. No acute bone abnormality. Old left rib fractures. IMPRESSION: Bilateral airspace densities in the lower lungs bilaterally. Differential diagnosis  includes bilateral pneumonia. Pulmonary edema cannot be excluded. Electronically Signed   By: Markus Daft M.D.   On: 06/24/2022 08:30    Cardiac Studies   Cardiac catheterization 03/21/2011: PROCEDURES: 1. Left cardiac cath with selective left and right coronary     angiography, LV graft via right groin using Judkins technique. 2. Successful PTCA to small diagonal 2 using 2.0 x 12 mm long mini     Trek balloon. 3. Successful PTCA to mid LAD using 2.5 x 15 mm long mini Trek     balloon. 4. Successful deployment of 2.75 x 26 mm Resolute integrity drug-     eluting stent in mid LAD. 5. Successful postdilatation of the stent using 3.0 x 20 mm long [place]     Trek balloon. 6. Successful postdilatation of proximal 1/4th of the stent using 3.5     x 8 mm long [place] Trek balloon. 7. Successful PTCA to mid ramus using 2.4 x 12 mm long mini Trek     balloon. 8. Successful deployment of 2.5 x 18 mm long Resolute integrity drug-     eluting stent in mid ramus. 9. Successful postdilatation of the stent using 2.5 x 15 mm long [place]     Trek balloon. 10.Successful PTCA to OM-3 using 2.5 x 12 mm long mini Trek balloon. 11.Successful deployment of 2.5 x 26 mm long Resolute integrity drug-     eluting stent in proximal and mid OM-3. 12.Successful postdilatation of the stent using 2.75 x 20 mm long      Trek balloon.  Patient Profile     56 y.o. male with non-ST elevation myocardial infarction, prior PCI's 2012 in the setting of MI, newly discovered severe mitral regurgitation with EF 40% with anemia iron deficiency newly discovered as well.  Assessment & Plan    Non-STEMI Acute systolic heart failure/likely ischemic cardiomyopathy New severe mitral regurgitation New iron deficiency anemia hemoglobin 8.2 ferritin 17 Hyperlipidemia Known CAD  -Discussed at length with patient.  We will go ahead and proceed with cardiac catheterization diagnostic right and left likely via the left radial artery since his  right radial does not seem to be robust and he has had prior fixation in his right forearm.  Risk and benefits of been explained including stroke heart attack death renal bleeding.  He is willing to proceed. -Discussed the case with Dr. Burt Knack of interventional cardiology.  I think it makes sense for [place] to understand his anatomy at this point especially given his severe MR.  This could likely be posterior leaflet tethering that is ischemically mediated in combination with dilation given reduced ejection fraction. -We will also plan for transesophageal echocardiogram as well we could do today at 1 PM to evaluate mitral regurgitation.  Discussed with Dr. Debara Pickett. -He currently does not have any active signs of bleeding.  No need for transfusion.  No abdominal discomfort.  No hematemesis. -GI consultation as well for iron deficiency anemia likely GI source. -Troponin 1400    For questions or updates, please contact [place] Please consult [email or web address] for contact info under        Signed, Candee Furbish, MD  06/25/2022, 9:16 AM

## 2022-06-25 NOTE — Progress Notes (Addendum)
OVERNIGHT CROSS COVER NOTE   Called the bedside to evaluate Mr. Krol for chest pain. He states that he got up to use the restroom and acutely became diaphoretic, hypoxic, with accompanied substernal chest pressure. Currently on 10 L of oxygen. EKG with worsening ST depressions in V4, but continued depression in remaining lateral leads (V5-V6). Chest pain resolved completely with nitroglycerin.   CXR with worsening bilateral pulmonary edema.   Exam  Conversational dyspnea  + JVD Right leg unilateral edema ( duplex negative) Diaphoresis   Assessment & Plan  Mr. Monette presents with what appears to be worsening ischemia.  Given his history of disease it is likely he has obstructive coronary artery disease and his supply demand mismatch has been exacerbated by anemia and now worsening profound pulmonary edema with the addition of exertion.  Mr. Fish last had melena yesterday (on aspirin and reportedly Plavix) thus would not recommend giving additional heparin on top of DAPT as he would likely continue to bleed without source identification and cessation.    PLAN:   Would recommend at this time decreasing the demand that exacerbated his chest pain overnight by aggressive diuresis. In the 45 minutes of my evaluation he has put out almost a liter of urine with 40 mg of IV Lasix and already is less diaphoretic and without conversational dyspnea. In addition, would ensure his BP is well controlled.   Would recommend evaluation with our GI colleagues to determine if he would be amenable to PCI. Given his ACS, would recommend transfusion with Hgb<8 and transfuse for Hgb goal 8-10 if pain is ongoing or is hemodynamically unstable. If transfusion is needed, would recommend chasing with diuretic.    Given the complexity of the case I discussed this with the overnight interventionalist  Libby Maw, MD MS  Cardiology

## 2022-06-25 NOTE — Assessment & Plan Note (Signed)
Cell count stable.  

## 2022-06-25 NOTE — Transfer of Care (Signed)
Immediate Anesthesia Transfer of Care Note  Patient: [name]  Procedure(s) Performed: TRANSESOPHAGEAL ECHOCARDIOGRAM (TEE)  Patient Location: Endoscopy Unit  Anesthesia Type:MAC  Level of Consciousness: awake, alert  and oriented  Airway & Oxygen Therapy: Patient Spontanous Breathing and Patient connected to nasal cannula oxygen  Post-op Assessment: Report given to RN and Post -op Vital signs reviewed and stable  Post vital signs: Reviewed and stable  Last Vitals:  Vitals Value Taken Time  BP 96/71 06/25/22 1330  Temp    Pulse 89 06/25/22 1330  Resp 28 06/25/22 1330  SpO2 97 % 06/25/22 1330  Vitals shown include unvalidated device data.  Last Pain:  Vitals:   06/25/22 1329  TempSrc:   PainSc: 0-No pain         Complications: No notable events documented.

## 2022-06-25 NOTE — Interval H&P Note (Signed)
History and Physical Interval Note:  06/25/2022 2:53 PM  [name]  has presented today for surgery, with the diagnosis of chest pain.  The various methods of treatment have been discussed with the patient and family. After consideration of risks, benefits and other options for treatment, the patient has consented to  Procedure(s): RIGHT/LEFT HEART CATH AND CORONARY ANGIOGRAPHY (N/A) as a surgical intervention.  The patient's history has been reviewed, patient examined, no change in status, stable for surgery.  I have reviewed the patient's chart and labs.  Questions were answered to the patient's satisfaction.     [name]

## 2022-06-25 NOTE — Assessment & Plan Note (Addendum)
Continue blood pressure support with milrinone  Tolerating well entresto.

## 2022-06-25 NOTE — Anesthesia Postprocedure Evaluation (Signed)
Anesthesia Post Note  Patient: [name]  Procedure(s) Performed: TRANSESOPHAGEAL ECHOCARDIOGRAM (TEE)     Patient location during evaluation: Endoscopy Anesthesia Type: MAC Level of consciousness: awake Pain management: pain level controlled Respiratory status: spontaneous breathing Cardiovascular status: stable Postop Assessment: adequate PO intake Anesthetic complications: no   No notable events documented.  Last Vitals:  Vitals:   06/25/22 1120 06/25/22 1208  BP:  104/80  Pulse:  100  Resp:  (!) 23  Temp:  36.9 C  SpO2: 100% 95%    Last Pain:  Vitals:   06/25/22 1208  TempSrc: Oral  PainSc: 3                  [name]

## 2022-06-25 NOTE — Assessment & Plan Note (Addendum)
Continue with pantoprazole Patient had dark stools on admission, Sp 2 units PRBC transfusion since admission.   Plan for EGD and colonoscopy today. Continue with pantoprazole po daily.

## 2022-06-25 NOTE — CV Procedure (Addendum)
TRANSESOPHAGEAL ECHOCARDIOGRAM (TEE) NOTE  INDICATIONS: Mitral regurgitation, heart failure  PROCEDURE:   Informed consent was obtained prior to the procedure. The risks, benefits and alternatives for the procedure were discussed and the patient comprehended these risks.  Risks include, but are not limited to, cough, sore throat, vomiting, nausea, somnolence, esophageal and stomach trauma or perforation, bleeding, low blood pressure, aspiration, pneumonia, infection, trauma to the teeth and death.    After a procedural time-out, the patient was given propofol for sedation by anesthesia. See their separate report.  The patient's heart rate, blood pressure, and oxygen saturation are monitored continuously during the procedure.The oropharynx was anesthetized with topical cetacaine.  The transesophageal probe was inserted in the esophagus and stomach without difficulty and multiple views were obtained.  The patient was kept under observation until the patient left the procedure room.  I was present face-to-face 100% of this time. The patient left the procedure room in stable condition.   Agitated microbubble saline contrast was not administered.  COMPLICATIONS:    There were no immediate complications.  Findings:  LEFT VENTRICLE: The left ventricular wall thickness is mildly increased.  The left ventricular cavity is normal in size. Wall motion is abnormal demonstrating severe mid to distal anterior, anteroapical and anterolateral hypokinesis and basal to mid inferior/inferolateral hypokinesis suggestive of multivessel territory ischemia/infarct.  LVEF is 40-45%.  RIGHT VENTRICLE:  The right ventricle is normal in structure and function without any thrombus or masses.    LEFT ATRIUM:  The left atrium is severely in size without any thrombus or masses.  There is not spontaneous echo contrast ("smoke") in the left atrium consistent with a low flow state.  LEFT ATRIAL APPENDAGE:  The left  atrial appendage is free of any thrombus or masses. The appendage has single lobes. Pulse doppler indicates moderate flow in the appendage.  ATRIAL SEPTUM:  The atrial septum appears intact and is free of thrombus and/or masses.  There is no evidence for interatrial shunting by color doppler.  RIGHT ATRIUM:  The right atrium is normal in size and function without any thrombus or masses. Prominent eustachian valve.  MITRAL VALVE:  The mitral valve is thickened and does not coapt well. The posterior leaflet appears tethered. There is  moderate to severe  central to posteriorly directed regurgitation. Probably due annular dilatation and ischemic posterior tethering. There were no vegetations or stenosis.  AORTIC VALVE:  The aortic valve is trileaflet, normal in structure and function with  no  regurgitation.  There were no vegetations or stenosis  TRICUSPID VALVE:  The tricuspid valve is normal in structure and function with Mild regurgitation.  There were no vegetations or stenosis   PULMONIC VALVE:  The pulmonic valve is normal in structure and function with  no  regurgitation.  There were no vegetations or stenosis.   AORTIC ARCH, ASCENDING AND DESCENDING AORTA:  There was grade 1 Ron Parker et. Al, 1992) atherosclerosis of the ascending aorta, aortic arch, or proximal descending aorta.  12. PULMONARY VEINS: Anomalous pulmonary venous return was not noted.  13. PERICARDIUM: The pericardium appeared normal and non-thickened.  There is a trivial pericardial effusion.  IMPRESSION:   Moderate to severe MR, with a centro/posteriorly directed jet, likely due to ischemic tethering of the posterior leaflet and dilated annulus in the setting of severe LAE No LAA thrombus Negative for PFO by color doppler Severe LAE LVEF 40-45% with regional WMA's of the mid to distal anterior and apical walls, anterolateral walls  and basal to mid inferior and inferolateral walls, suggestive of multivessel territory  ischemia/infarct.  RECOMMENDATIONS:    Plan to proceed with [facility] today for further evaluation of etiology of heart failure.  Time Spent Directly with the Patient:  45 minutes   Pixie Casino, MD, [facility], [place] Director of the [facility] Diplomate of the American Board of Clinical Lipidology Attending Cardiologist  Direct Dial: [phone]  Fax: [phone]  Website:  [email or web address]  Nadean Corwin Ercell Perlman 06/25/2022, 1:24 PM

## 2022-06-25 NOTE — Assessment & Plan Note (Addendum)
Hyperglycemia/ hypoglycemia   Fasting glucose is 133, capillary down to 67   Plan to decrease basal insulin to 15 units bid  and continue with 5 units pre meal along with insulin sliding scale.    Continue with statin therapy

## 2022-06-25 NOTE — Anesthesia Preprocedure Evaluation (Signed)
Anesthesia Evaluation  Patient identified by MRN, date of birth, ID band Patient awake    Reviewed: NPO status , Patient's Chart, lab work & pertinent test results  Airway Mallampati: II       Dental   Pulmonary sleep apnea , Current Smoker and Patient abstained from smoking.,    breath sounds clear to auscultation       Cardiovascular hypertension, + CAD and + Past MI   Rhythm:Regular Rate:Normal     Neuro/Psych    GI/Hepatic Neg liver ROS, GERD  ,  Endo/Other  diabetes  Renal/GU negative Renal ROS     Musculoskeletal   Abdominal   Peds  Hematology   Anesthesia Other Findings   Reproductive/Obstetrics                             Anesthesia Physical Anesthesia Plan  ASA: 3  Anesthesia Plan: MAC   Post-op Pain Management:    Induction: Intravenous  PONV Risk Score and Plan: Treatment may vary due to age or medical condition and Propofol infusion  Airway Management Planned: Nasal Cannula and Simple Face Mask  Additional Equipment:   Intra-op Plan:   Post-operative Plan:   Informed Consent: I have reviewed the patients History and Physical, chart, labs and discussed the procedure including the risks, benefits and alternatives for the proposed anesthesia with the patient or authorized representative who has indicated his/her understanding and acceptance.     Dental advisory given  Plan Discussed with: CRNA  Anesthesia Plan Comments:         Anesthesia Quick Evaluation

## 2022-06-25 NOTE — Assessment & Plan Note (Addendum)
Multivessel coronary artery disease NSTEMI  Continue with aspirin and statin Continue blood pressure control with losartan.  No B blockade due to low output heart failure decompensation.   Patient has been on heparin for 48 hrs, will plan to complete 72 hrs before discontinue .  Continue with statin therapy and aspirin.

## 2022-06-25 NOTE — Anesthesia Procedure Notes (Signed)
Procedure Name: MAC Date/Time: 06/25/2022 12:55 PM  Performed by: Anastasio Auerbach, CRNAPre-anesthesia Checklist: Emergency Drugs available, Suction available and Patient being monitored Oxygen Delivery Method: Nasal cannula Induction Type: IV induction Airway Equipment and Method: Bite block

## 2022-06-25 NOTE — Progress Notes (Signed)
  Echocardiogram Echocardiogram Transesophageal has been performed.  [name] 06/25/2022, 1:30 PM

## 2022-06-25 NOTE — Progress Notes (Signed)
Spoke to cardiologist.  Patient currently hypertensive, cardiology recommending nitroglycerin drip and continuing diuresis.  Dr. Margaretha Sheffield has also discussed the case with on-call interventional cardiologist.  Given anemia/melena/concern for GI bleed, not a candidate for cardiac catheterization at this time as he cannot be anticoagulated with heparin.  GI needs to be consulted in the morning.  Will continue to trend troponin.

## 2022-06-25 NOTE — H&P (View-Only) (Signed)
Rounding Note    Patient Name: [name] Date of Encounter: 06/25/2022  [place] Cardiologist: None [name]  Subjective   Feeling better this morning breathing better.  On 2 to 3 L of oxygen.  No bowel movement since hospitalization.  No hematemesis no signs of active bleeding.  Previously reported alcohol about 6 drinks per week.  Also has a smoking history as well.  Inpatient Medications    Scheduled Meds:  aspirin  325 mg Oral Daily   carvedilol  25 mg Oral BID WC   furosemide  40 mg Intravenous BID   insulin aspart  0-15 Units Subcutaneous TID WC   insulin aspart  0-5 Units Subcutaneous QHS   lisinopril  40 mg Oral Daily   pantoprazole (PROTONIX) IV  40 mg Intravenous Q24H   potassium chloride  40 mEq Oral Daily   rosuvastatin  20 mg Oral Daily   Continuous Infusions:  nitroGLYCERIN 5 mcg/min (06/25/22 0533)   PRN Meds: acetaminophen, guaiFENesin-dextromethorphan, ondansetron (ZOFRAN) IV   Vital Signs    Vitals:   06/25/22 0532 06/25/22 0546 06/25/22 0625 06/25/22 0824  BP: 134/87 123/81  100/64  Pulse: (!) 112 (!) 105  94  Resp:  20  20  Temp:  98.3 F (36.8 C)  98.4 F (36.9 C)  TempSrc:  Oral  Oral  SpO2: 97% 99%  100%  Weight:   72.2 kg   Height:        Intake/Output Summary (Last 24 hours) at 06/25/2022 0916 Last data filed at 06/25/2022 0825 Gross per 24 hour  Intake 290 ml  Output 800 ml  Net -510 ml      06/25/2022    6:25 AM 06/25/2022    1:16 AM 01/03/2021    9:18 PM  Last 3 Weights  Weight (lbs) 159 lb 3.2 oz 160 lb 12.8 oz 145 lb  Weight (kg) 72.213 kg 72.938 kg 65.772 kg      Telemetry    No adverse arrhythmias, sinus rhythm/sinus tachycardia- Personally Reviewed  ECG    06/25/2022-sinus tachycardia 113 at 2 AM.  Nonspecific ST segment changes currently in the 80s and 90s personally Reviewed  Physical Exam   GEN: No acute distress.   Neck: No JVD Cardiac: RRR, 2/6 holosystolic murmur apex, no rubs, or gallops.   Respiratory: Clear to auscultation bilaterally. GI: Soft, nontender, non-distended  MS: No edema; No deformity. Neuro:  Nonfocal  Psych: Normal affect   Labs    High Sensitivity Troponin:   Recent Labs  Lab 06/24/22 0809 06/24/22 1006 06/25/22 0028 06/25/22 0641  TROPONINIHS 139* 106* 1,477* 1,125*     Chemistry Recent Labs  Lab 06/24/22 0809 06/24/22 1143  NA 137  --   K 3.4*  --   CL 99  --   CO2 23  --   GLUCOSE 295*  --   BUN 17  --   CREATININE 0.87  --   CALCIUM 10.2  --   MG  --  1.5*  GFRNONAA >60  --   ANIONGAP 15  --     Lipids  Recent Labs  Lab 06/25/22 0028  CHOL 140  TRIG 66  HDL 43  LDLCALC 84  CHOLHDL 3.3    Hematology Recent Labs  Lab 06/24/22 0809 06/24/22 1143 06/24/22 1734  WBC 10.9*  --   --   RBC 3.70*  --   --   HGB 8.2* 8.1* 8.4*  HCT 28.5* 28.0* 28.7*  MCV  77.0*  --   --   MCH 22.2*  --   --   MCHC 28.8*  --   --   RDW 21.1*  --   --   PLT 364  --   --    Thyroid No results for input(s): "TSH", "FREET4" in the last 168 hours.  BNP Recent Labs  Lab 06/24/22 0809 06/25/22 0133  BNP 639.0* 830.5*    DDimer No results for input(s): "DDIMER" in the last 168 hours.   Radiology    DG CHEST PORT 1 VIEW  Result Date: 06/25/2022 CLINICAL DATA:  Chest pain, hypoxia EXAM: PORTABLE CHEST 1 VIEW COMPARISON:  06/24/2022 FINDINGS: Bilateral perihilar and bibasilar pulmonary infiltrates are again seen, favoring edema though infection could appear similarly. Small right pleural effusion is slightly increased in the interval. Pulmonary insufflation has slightly diminished in the interval since prior examination. No pneumothorax. Cardiac size within normal limits. No acute bone abnormality. IMPRESSION: 1. Stable bilateral perihilar and bibasilar pulmonary infiltrates, favoring edema though infection could appear similarly. Slight interval enlargement of small right pleural effusion. Electronically Signed   By: Fidela Salisbury M.D.   On:  06/25/2022 00:56   VAS [place] LOWER EXTREMITY VENOUS (DVT)  Result Date: 06/24/2022  Lower Venous DVT Study Patient Name:  [name]  Date of Exam:   06/24/2022 Medical Rec #: [id number]             Accession #:    [id number] Date of Birth: 1966/06/14              Patient Gender: M Patient Age:   56 years Exam Location:  [facility] Procedure:      VAS [place] LOWER EXTREMITY VENOUS (DVT) Referring Phys: Cherylann Ratel --------------------------------------------------------------------------------  Indications: Swelling.  Risk Factors: None identified. Comparison Study: No prior studies. Performing Technologist: Oliver Hum RVT  Examination Guidelines: A complete evaluation includes B-mode imaging, spectral Doppler, color Doppler, and power Doppler as needed of all accessible portions of each vessel. Bilateral testing is considered an integral part of a complete examination. Limited examinations for reoccurring indications may be performed as noted. The reflux portion of the exam is performed with the patient in reverse Trendelenburg.  +---------+---------------+---------+-----------+----------+--------------+ RIGHT    CompressibilityPhasicitySpontaneityPropertiesThrombus Aging +---------+---------------+---------+-----------+----------+--------------+ CFV      Full           Yes      Yes                                 +---------+---------------+---------+-----------+----------+--------------+ SFJ      Full                                                        +---------+---------------+---------+-----------+----------+--------------+ FV Prox  Full                                                        +---------+---------------+---------+-----------+----------+--------------+ FV Mid   Full                                                        +---------+---------------+---------+-----------+----------+--------------+  FV DistalFull                                                         +---------+---------------+---------+-----------+----------+--------------+ PFV      Full                                                        +---------+---------------+---------+-----------+----------+--------------+ POP      Full           Yes      Yes                                 +---------+---------------+---------+-----------+----------+--------------+ PTV      Full                                                        +---------+---------------+---------+-----------+----------+--------------+ PERO     Full                                                        +---------+---------------+---------+-----------+----------+--------------+   +----+---------------+---------+-----------+----------+--------------+ LEFTCompressibilityPhasicitySpontaneityPropertiesThrombus Aging +----+---------------+---------+-----------+----------+--------------+ CFV Full           Yes      Yes                                 +----+---------------+---------+-----------+----------+--------------+     Summary: RIGHT: - There is no evidence of deep vein thrombosis in the lower extremity.  - No cystic structure found in the popliteal fossa.  LEFT: - No evidence of common femoral vein obstruction.  *See table(s) above for measurements and observations. Electronically signed by Deitra Mayo MD on 06/24/2022 at 6:00:28 PM.    Final    ECHOCARDIOGRAM COMPLETE  Result Date: 06/24/2022    ECHOCARDIOGRAM REPORT   Patient Name:   IOANE BHOLA Date of Exam: 06/24/2022 Medical Rec #:  [id number]            Height:       71.0 in Accession #:    [id number]           Weight:       145.0 lb Date of Birth:  11-Sep-1966             BSA:          1.839 m Patient Age:    80 years             BP:           106/77 mmHg Patient Gender: M                    HR:           96 bpm. Exam Location:  Inpatient Procedure: 2D Echo, Cardiac Doppler and Color Doppler Indications:     Elevated troponin  History:        Patient has no prior history of Echocardiogram examinations.                 CAD; Risk Factors:Diabetes, Hypertension and Dyslipidemia.  Sonographer:    Memory Argue Referring Phys: [id number] [place]  1. Left ventricular ejection fraction, by estimation, is 40 to 45%. The left ventricle has mildly decreased function. The left ventricle demonstrates regional wall motion abnormalities (see scoring diagram/findings for description). The left ventricular  internal cavity size was mildly dilated. Left ventricular diastolic parameters are consistent with Grade III diastolic dysfunction (restrictive). Elevated left atrial pressure. There is moderate hypokinesis of the left ventricular, basal-mid inferolateral wall and anterolateral wall.  2. Right ventricular systolic function is normal. The right ventricular size is normal. There is severely elevated pulmonary artery systolic pressure.  3. Left atrial size was severely dilated.  4. 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 The mitral valve is normal in structure. Severe mitral valve regurgitation.  5. The aortic valve is normal in structure. Aortic valve regurgitation is not visualized.  6. The inferior vena cava is dilated in size with <50% respiratory variability, suggesting right atrial pressure of 15 mmHg. FINDINGS  Left Ventricle: Left ventricular ejection fraction, by estimation, is 40 to 45%. The left ventricle has mildly decreased function. The left ventricle demonstrates regional wall motion abnormalities. Moderate hypokinesis of the left ventricular, basal-mid inferolateral wall and anterolateral wall. The left ventricular internal cavity size was mildly dilated. There is no left ventricular hypertrophy. Left ventricular diastolic parameters are consistent with Grade III diastolic dysfunction (restrictive). Elevated left atrial pressure. Right Ventricle: The right ventricular  size is normal. No increase in right ventricular wall thickness. Right ventricular systolic function is normal. There is severely elevated pulmonary artery systolic pressure. The tricuspid regurgitant velocity is 3.40 m/s, and with an assumed right atrial pressure of 15 mmHg, the estimated right ventricular systolic pressure is 19.4 mmHg. Left Atrium: Left atrial size was severely dilated. Right Atrium: Right atrial size was normal in size. Prominent Eustachian valve. Pericardium: There is no evidence of pericardial effusion. 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 The mitral valve is normal in structure. There is mild thickening of the mitral valve leaflet(s). Severe mitral valve regurgitation, with centrally-directed jet. Tricuspid Valve: The tricuspid valve is normal in structure. Tricuspid valve regurgitation is mild. Aortic Valve: The aortic valve is normal in structure. Aortic valve regurgitation is not visualized. Aortic valve mean gradient measures 2.0 mmHg. Aortic valve peak gradient measures 4.6 mmHg. Aortic valve area, by VTI measures 3.60 cm. Pulmonic Valve: The pulmonic valve was not well visualized. Pulmonic valve regurgitation is not visualized. Aorta: The aortic root and ascending aorta are structurally normal, with no evidence of dilitation. Venous: The inferior vena cava is dilated in size with less than 50% respiratory variability, suggesting right atrial pressure of 15 mmHg. IAS/Shunts: No atrial level shunt detected by color flow Doppler.  LEFT VENTRICLE PLAX 2D LVIDd:         5.90 cm      Diastology LVIDs:         4.50 cm      LV e' medial:    8.16 cm/s LV PW:         0.80 cm      LV E/e' medial:  21.1 LV IVS:        0.80 cm      LV e' lateral:   8.49 cm/s LVOT diam:     2.20 cm      LV E/e' lateral: 20.3 LV SV:         55 LV SV Index:   30 LVOT Area:     3.80 cm  LV Volumes (MOD) LV vol d, MOD A2C: 227.0 ml LV vol d, MOD A4C: 214.0 ml LV vol s,  MOD A2C: 143.0 ml LV vol s, MOD A4C: 127.0 ml LV SV MOD A2C:     84.0 ml LV SV MOD A4C:     214.0 ml LV SV MOD BP:      90.9 ml RIGHT VENTRICLE RV S prime:     11.20 cm/s TAPSE (M-mode): 1.9 cm LEFT ATRIUM            Index        RIGHT ATRIUM           Index LA Vol (A4C): 106.0 ml 57.63 ml/m  RA Area:     11.70 cm                                     RA Volume:   30.40 ml  16.53 ml/m  AORTIC VALVE AV Area (Vmax):    3.59 cm AV Area (Vmean):   3.39 cm AV Area (VTI):     3.60 cm AV Vmax:           107.00 cm/s AV Vmean:          69.800 cm/s AV VTI:            0.154 m AV Peak Grad:      4.6 mmHg AV Mean Grad:      2.0 mmHg LVOT Vmax:         101.00 cm/s LVOT Vmean:        62.200 cm/s LVOT VTI:          0.146 m LVOT/AV VTI ratio: 0.95  AORTA Ao Root diam: 3.10 cm Ao Asc diam:  3.10 cm MITRAL VALVE                 TRICUSPID VALVE MV Area (PHT): 8.72 cm      TR Peak grad:   46.2 mmHg MV Decel Time: 87 msec       TR Vmax:        340.00 cm/s MR Peak grad:   105.9 mmHg MR Mean grad:   70.0 mmHg    SHUNTS MR Vmax:        514.50 cm/s  Systemic VTI:  0.15 m MR Vmean:       390.0 cm/s   Systemic Diam: 2.20 cm MR PISA:        1.01 cm MR PISA Radius: 0.40 cm MV E velocity: 172.00 cm/s MV A velocity: 65.00 cm/s MV E/A ratio:  2.65 Mihai Croitoru MD Electronically signed by Sanda Klein MD Signature Date/Time: 06/24/2022/3:20:26 PM    Final    DG Chest 2 View  Result Date: 06/24/2022 CLINICAL DATA:  Chest pain. EXAM: CHEST - 2 VIEW COMPARISON:  Chest radiograph 12/29/2019 FINDINGS: Two views of the chest demonstrate patchy bilateral airspace densities in the lower lungs. Central vascular structures are slightly prominent and there may be mild peribronchial thickening. Heart size is within normal limits.  No large pleural effusions. Trachea is midline. Negative for a pneumothorax. No acute bone abnormality. Old left rib fractures. IMPRESSION: Bilateral airspace densities in the lower lungs bilaterally. Differential diagnosis  includes bilateral pneumonia. Pulmonary edema cannot be excluded. Electronically Signed   By: Markus Daft M.D.   On: 06/24/2022 08:30    Cardiac Studies   Cardiac catheterization 03/21/2011: PROCEDURES: 1. Left cardiac cath with selective left and right coronary     angiography, LV graft via right groin using Judkins technique. 2. Successful PTCA to small diagonal 2 using 2.0 x 12 mm long mini     Trek balloon. 3. Successful PTCA to mid LAD using 2.5 x 15 mm long mini Trek     balloon. 4. Successful deployment of 2.75 x 26 mm Resolute integrity drug-     eluting stent in mid LAD. 5. Successful postdilatation of the stent using 3.0 x 20 mm long [place]     Trek balloon. 6. Successful postdilatation of proximal 1/4th of the stent using 3.5     x 8 mm long [place] Trek balloon. 7. Successful PTCA to mid ramus using 2.4 x 12 mm long mini Trek     balloon. 8. Successful deployment of 2.5 x 18 mm long Resolute integrity drug-     eluting stent in mid ramus. 9. Successful postdilatation of the stent using 2.5 x 15 mm long [place]     Trek balloon. 10.Successful PTCA to OM-3 using 2.5 x 12 mm long mini Trek balloon. 11.Successful deployment of 2.5 x 26 mm long Resolute integrity drug-     eluting stent in proximal and mid OM-3. 12.Successful postdilatation of the stent using 2.75 x 20 mm long [place]     Trek balloon.  Patient Profile     56 y.o. male with non-ST elevation myocardial infarction, prior PCI's 2012 in the setting of MI, newly discovered severe mitral regurgitation with EF 40% with anemia iron deficiency newly discovered as well.  Assessment & Plan    Non-STEMI Acute systolic heart failure/likely ischemic cardiomyopathy New severe mitral regurgitation New iron deficiency anemia hemoglobin 8.2 ferritin 17 Hyperlipidemia Known CAD  -Discussed at length with patient.  We will go ahead and proceed with cardiac catheterization diagnostic right and left likely via the left radial artery since his  right radial does not seem to be robust and he has had prior fixation in his right forearm.  Risk and benefits of been explained including stroke heart attack death renal bleeding.  He is willing to proceed. -Discussed the case with Dr. Burt Knack of interventional cardiology.  I think it makes sense for [place] to understand his anatomy at this point especially given his severe MR.  This could likely be posterior leaflet tethering that is ischemically mediated in combination with dilation given reduced ejection fraction. -We will also plan for transesophageal echocardiogram as well we could do today at 1 PM to evaluate mitral regurgitation.  Discussed with Dr. Debara Pickett. -He currently does not have any active signs of bleeding.  No need for transfusion.  No abdominal discomfort.  No hematemesis. -GI consultation as well for iron deficiency anemia likely GI source. -Troponin 1400    For questions or updates, please contact [place] Please consult [email or web address] for contact info under        Signed, Candee Furbish, MD  06/25/2022, 9:16 AM

## 2022-06-25 NOTE — Anesthesia Postprocedure Evaluation (Signed)
Anesthesia Post Note  Patient: [name]  Procedure(s) Performed: TRANSESOPHAGEAL ECHOCARDIOGRAM (TEE)     Patient location during evaluation: Endoscopy Anesthesia Type: MAC Level of consciousness: awake Pain management: pain level controlled Vital Signs Assessment: post-procedure vital signs reviewed and stable Respiratory status: spontaneous breathing Cardiovascular status: stable Postop Assessment: no apparent nausea or vomiting Anesthetic complications: no   No notable events documented.  Last Vitals:  Vitals:   06/25/22 1400 06/25/22 1435  BP: 105/77   Pulse: 90   Resp: (!) 26   Temp:    SpO2: 97% 93%    Last Pain:  Vitals:   06/25/22 1511  TempSrc:   PainSc: 0-No pain                 Lindsie Simar

## 2022-06-25 NOTE — Progress Notes (Signed)
Overnight event  Patient admitted 9/5 at [facility] for chest pain/elevated troponin (106 > 139).  History of CAD with prior MI in 2012 status post multivessel stenting.  Cardiology consulted and he was not started on IV heparin due to worsening anemia/dark stool/concern for GI bleed.  Hemoglobin dropped from 12.9 in 12/2020 to 8.2 at the time of admission.  BNP 639 and chest x-ray concerning for pulmonary edema and he was started on IV Lasix 40 mg twice daily, last dose of Lasix at 0845 on 9/5.  Echocardiogram done and showing EF 40 to 45%, G3 DD, RWMA, severely elevated pulmonary artery systolic pressure, severe mitral valve regurgitation.  He was transferred to [facility] by cardiology in case cardiac procedure was needed.    Notified by RN that patient complained of 9 out of 10 intensity left-sided crushing chest pain.  His breathing became labored and he became tachycardic.  He was satting in the upper 80s/low 90s on 6 L O2.  After oxygen increased to 10 L, his symptoms improved and now satting 98-100%.  Respiratory rate 24.  Heart rate initially in the 120s but now improved to 100-110.   Patient seen and examined at bedside.  Reports experiencing ongoing chest pain for the past few days but it became much worse earlier tonight, left-sided pressure-like pain which was 8 out of 10 in intensity.  Reports having shortness of breath at that time and had to sit up in the bed.  He reports improvement after his supplemental oxygen was increased.  He denies any chest pain at this time.  At the time of my evaluation, patient tachycardic to the 110s, blood pressure 130/91, SPO2 98% on 10 L O2.  Sitting up at the side of the bed and appears comfortable, no respiratory distress.  Rales appreciated upon auscultation of the lungs.    EKG showing sinus tachycardia with ST depressions in lateral leads, no significant change compared to EKG done at the time of admission.   Chest x-ray showing bilateral perihilar and  bibasilar infiltrates concerning for pulmonary edema, slight interval enlargement of the small right pleural effusion.  I think pneumonia is less likely given no fever or significant leukocytosis.  Will check procalcitonin level.  -IV Lasix 40 mg given, continue diuresis and monitor intake and output/daily weights -Stat labs ordered including troponin, BNP, procalcitonin -Cardiology notified and will see the patient, appreciate help

## 2022-06-26 ENCOUNTER — Other Ambulatory Visit (HOSPITAL_COMMUNITY): Payer: Self-pay

## 2022-06-26 ENCOUNTER — Inpatient Hospital Stay (HOSPITAL_COMMUNITY): Payer: No Typology Code available for payment source

## 2022-06-26 ENCOUNTER — Encounter (HOSPITAL_COMMUNITY): Payer: Self-pay | Admitting: Cardiovascular Disease

## 2022-06-26 DIAGNOSIS — E785 Hyperlipidemia, unspecified: Secondary | ICD-10-CM

## 2022-06-26 DIAGNOSIS — D509 Iron deficiency anemia, unspecified: Secondary | ICD-10-CM | POA: Diagnosis present

## 2022-06-26 DIAGNOSIS — E1169 Type 2 diabetes mellitus with other specified complication: Secondary | ICD-10-CM

## 2022-06-26 DIAGNOSIS — E876 Hypokalemia: Secondary | ICD-10-CM

## 2022-06-26 LAB — MAGNESIUM: Magnesium: 1.3 mg/dL — ABNORMAL LOW (ref 1.7–2.4)

## 2022-06-26 LAB — URINALYSIS, ROUTINE W REFLEX MICROSCOPIC
Bacteria, UA: NONE SEEN
Bilirubin Urine: NEGATIVE
Glucose, UA: >=500 mg/dL — AB
Hgb urine dipstick: NEGATIVE
Ketones, ur: NEGATIVE mg/dL
Leukocytes,Ua: NEGATIVE
Nitrite: NEGATIVE
Protein, ur: NEGATIVE mg/dL
Specific Gravity, Urine: 1.023 (ref 1.005–1.030)
pH: 5 (ref 5.0–8.0)

## 2022-06-26 LAB — FOLATE: Folate: 7 ng/mL (ref >5.9)

## 2022-06-26 LAB — RETICULOCYTES
Immature Retic Fract: 27.2 % — ABNORMAL HIGH (ref 2.3–15.9)
RBC.: 3.33 MIL/uL — ABNORMAL LOW (ref 4.22–5.81)
Retic Count, Absolute: 62.9 10*3/uL (ref 19.0–186.0)
Retic Ct Pct: 1.9 % (ref 0.4–3.1)

## 2022-06-26 LAB — GLUCOSE, CAPILLARY
Glucose-Capillary: 271 mg/dL — ABNORMAL HIGH (ref 70–99)
Glucose-Capillary: 313 mg/dL — ABNORMAL HIGH (ref 70–99)
Glucose-Capillary: 239 mg/dL — ABNORMAL HIGH (ref 70–99)
Glucose-Capillary: 421 mg/dL — ABNORMAL HIGH (ref 70–99)

## 2022-06-26 LAB — CBC
HCT: 25.4 % — ABNORMAL LOW (ref 39.0–52.0)
Hemoglobin: 7.5 g/dL — ABNORMAL LOW (ref 13.0–17.0)
MCH: 22.3 pg — ABNORMAL LOW (ref 26.0–34.0)
MCHC: 29.5 g/dL — ABNORMAL LOW (ref 30.0–36.0)
MCV: 75.4 fL — ABNORMAL LOW (ref 80.0–100.0)
Platelets: 332 10*3/uL (ref 150–400)
RBC: 3.37 MIL/uL — ABNORMAL LOW (ref 4.22–5.81)
RDW: 20.6 % — ABNORMAL HIGH (ref 11.5–15.5)
WBC: 11.8 10*3/uL — ABNORMAL HIGH (ref 4.0–10.5)
nRBC: 0 % (ref 0.0–0.2)

## 2022-06-26 LAB — IRON AND TIBC
Iron: 21 ug/dL — ABNORMAL LOW (ref 45–182)
Saturation Ratios: 4 % — ABNORMAL LOW (ref 17.9–39.5)
TIBC: 549 ug/dL — ABNORMAL HIGH (ref 250–450)
UIBC: 528 ug/dL

## 2022-06-26 LAB — POCT I-STAT EG7
Acid-Base Excess: 3 mmol/L — ABNORMAL HIGH (ref 0.0–2.0)
Bicarbonate: 26.4 mmol/L (ref 20.0–28.0)
Calcium, Ion: 1.18 mmol/L (ref 1.15–1.40)
HCT: 26 % — ABNORMAL LOW (ref 39.0–52.0)
Hemoglobin: 8.8 g/dL — ABNORMAL LOW (ref 13.0–17.0)
O2 Saturation: 49 %
Potassium: 3.6 mmol/L (ref 3.5–5.1)
Sodium: 134 mmol/L — ABNORMAL LOW (ref 135–145)
TCO2: 27 mmol/L (ref 22–32)
pCO2, Ven: 34.4 mmHg — ABNORMAL LOW (ref 44–60)
pH, Ven: 7.493 — ABNORMAL HIGH (ref 7.25–7.43)
pO2, Ven: 24 mmHg — CL (ref 32–45)

## 2022-06-26 LAB — BASIC METABOLIC PANEL
Anion gap: 13 (ref 5–15)
BUN: 13 mg/dL (ref 6–20)
CO2: 28 mmol/L (ref 22–32)
Calcium: 9.2 mg/dL (ref 8.9–10.3)
Chloride: 93 mmol/L — ABNORMAL LOW (ref 98–111)
Creatinine, Ser: 0.92 mg/dL (ref 0.61–1.24)
GFR, Estimated: >60 mL/min (ref >60)
Glucose, Bld: 336 mg/dL — ABNORMAL HIGH (ref 70–99)
Potassium: 3.3 mmol/L — ABNORMAL LOW (ref 3.5–5.1)
Sodium: 134 mmol/L — ABNORMAL LOW (ref 135–145)

## 2022-06-26 LAB — COOXEMETRY PANEL
Carboxyhemoglobin: 1.7 % — ABNORMAL HIGH (ref 0.5–1.5)
Methemoglobin: <0.7 % (ref 0.0–1.5)
O2 Saturation: 52.7 %
Total hemoglobin: 7.9 g/dL — ABNORMAL LOW (ref 12.0–16.0)

## 2022-06-26 LAB — HEPARIN LEVEL (UNFRACTIONATED)
Heparin Unfractionated: 0.16 IU/mL — ABNORMAL LOW (ref 0.30–0.70)
Heparin Unfractionated: 0.35 IU/mL (ref 0.30–0.70)

## 2022-06-26 LAB — FERRITIN: Ferritin: 26 ng/mL (ref 24–336)

## 2022-06-26 LAB — PROCALCITONIN: Procalcitonin: 0.28 ng/mL

## 2022-06-26 LAB — VITAMIN B12: Vitamin B-12: 468 pg/mL (ref 180–914)

## 2022-06-26 LAB — C-REACTIVE PROTEIN: CRP: 18.8 mg/dL — ABNORMAL HIGH (ref <1.0)

## 2022-06-26 MED ORDER — MAGNESIUM SULFATE 4 GM/100ML IV SOLN
4.0000 g | Freq: Once | INTRAVENOUS | Status: AC
  Administered 2022-06-26: 4 g via INTRAVENOUS
  Filled 2022-06-26: qty 100

## 2022-06-26 MED ORDER — POTASSIUM CHLORIDE CRYS ER 20 MEQ PO TBCR
40.0000 meq | EXTENDED_RELEASE_TABLET | Freq: Three times a day (TID) | ORAL | Status: AC
Start: 2022-06-26 — End: 2022-06-26
  Administered 2022-06-26 (×2): 40 meq via ORAL
  Filled 2022-06-26 (×2): qty 2

## 2022-06-26 MED ORDER — INSULIN ASPART 100 UNIT/ML IJ SOLN
6.0000 [IU] | Freq: Once | INTRAMUSCULAR | Status: AC
  Administered 2022-06-26: 2 [IU] via SUBCUTANEOUS

## 2022-06-26 MED ORDER — INSULIN GLARGINE-YFGN 100 UNIT/ML ~~LOC~~ SOLN
20.0000 [IU] | Freq: Every day | SUBCUTANEOUS | Status: DC
  Administered 2022-06-26 – 2022-06-27 (×2): 20 [IU] via SUBCUTANEOUS
  Filled 2022-06-26 (×2): qty 0.2

## 2022-06-26 MED ORDER — FUROSEMIDE 10 MG/ML IJ SOLN
80.0000 mg | Freq: Two times a day (BID) | INTRAMUSCULAR | Status: AC
  Administered 2022-06-26 (×2): 80 mg via INTRAVENOUS
  Filled 2022-06-26 (×3): qty 8

## 2022-06-26 MED ORDER — DIGOXIN 125 MCG PO TABS
0.1250 mg | ORAL_TABLET | Freq: Every day | ORAL | Status: DC
Start: 2022-06-26 — End: 2022-07-04
  Administered 2022-06-26 – 2022-07-03 (×8): 0.125 mg via ORAL
  Filled 2022-06-26 (×8): qty 1

## 2022-06-26 MED ORDER — SACUBITRIL-VALSARTAN 24-26 MG PO TABS: 1.0000 | ORAL_TABLET | Freq: Two times a day (BID) | ORAL | Status: DC

## 2022-06-26 MED ORDER — ASPIRIN 81 MG PO TBEC
81.0000 mg | DELAYED_RELEASE_TABLET | Freq: Every day | ORAL | Status: DC
Start: 2022-06-26 — End: 2022-07-04
  Administered 2022-06-26 – 2022-07-03 (×8): 81 mg via ORAL
  Filled 2022-06-26 (×8): qty 1

## 2022-06-26 MED ORDER — ROSUVASTATIN CALCIUM 20 MG PO TABS
40.0000 mg | ORAL_TABLET | Freq: Every day | ORAL | Status: DC
  Administered 2022-06-26 – 2022-07-10 (×14): 40 mg via ORAL
  Filled 2022-06-26 (×14): qty 2

## 2022-06-26 MED ORDER — LOSARTAN POTASSIUM 25 MG PO TABS
12.5000 mg | ORAL_TABLET | Freq: Every day | ORAL | Status: DC
Start: 2022-06-26 — End: 2022-06-27
  Administered 2022-06-26 – 2022-06-27 (×2): 12.5 mg via ORAL
  Filled 2022-06-26 (×2): qty 1

## 2022-06-26 MED ORDER — PANTOPRAZOLE SODIUM 40 MG PO TBEC
40.0000 mg | DELAYED_RELEASE_TABLET | Freq: Every day | ORAL | Status: DC
  Administered 2022-06-26 – 2022-06-30 (×5): 40 mg via ORAL
  Filled 2022-06-26 (×5): qty 1

## 2022-06-26 MED ORDER — ENSURE ENLIVE PO LIQD
237.0000 mL | Freq: Two times a day (BID) | ORAL | Status: DC
  Administered 2022-06-26 – 2022-07-03 (×13): 237 mL via ORAL

## 2022-06-26 MED ORDER — HEPARIN (PORCINE) 25000 UT/250ML-% IV SOLN
1450.0000 [IU]/h | INTRAVENOUS | Status: DC
  Administered 2022-06-26: 1150 [IU]/h via INTRAVENOUS
  Administered 2022-06-27: 1400 [IU]/h via INTRAVENOUS
  Administered 2022-06-27: 1300 [IU]/h via INTRAVENOUS
  Filled 2022-06-26 (×4): qty 250

## 2022-06-26 NOTE — Progress Notes (Signed)
Inpatient Diabetes Program Recommendations  AACE/ADA: New Consensus Statement on Inpatient Glycemic Control (2015)  Target Ranges:  Prepandial:   less than 140 mg/dL      Peak postprandial:   less than 180 mg/dL (1-2 hours)      Critically ill patients:  140 - 180 mg/dL   Lab Results  Component Value Date   GLUCAP 313 (H) 06/26/2022   HGBA1C 8.2 (H) 06/24/2022    Review of Glycemic Control  Latest Reference Range & Units 06/25/22 06:18 06/25/22 10:58 06/25/22 17:20 06/25/22 21:29 06/26/22 06:12 06/26/22 10:48  Glucose-Capillary 70 - 99 mg/dL 250 (H) 295 (H) 244 (H) 359 (H) 271 (H) 313 (H)   Diabetes history: DM 2 Outpatient Diabetes medications:  Lantus 50 units q HS, Jardiance 12.5 mg daily, Glipizide 5 mg daily, Metformin 500 mg bid Current orders for Inpatient glycemic control:  Novolog 0-15 units tid with meals and HS  Inpatient Diabetes Program Recommendations:    Please restart a portion of patients home basal insulin.  Consider adding Semglee 20 units daily (start today).   Thanks,  Adah Perl, RN, BC-ADM Inpatient Diabetes Coordinator Pager [phone]  (8a-5p)

## 2022-06-26 NOTE — Progress Notes (Signed)
HOSPITAL MEDICINE OVERNIGHT EVENT NOTE    Notified by nursing that patient is exhibiting a elevated MEWS score.  Chart reviewed, patient is currently being treated for acute congestive heart failure found to have advanced multivessel coronary disease for cardiac catheterization on 9/6.  Patient is actively being diuresed and is on milrinone.  Patient is currently not receiving antibiotics.  Patient is quite ill and is quite complicated.  Patient has several reasons to exhibit multiple SIRS criteria however patient is now beginning to exhibit a fever of 100.4 F.    Considering patient is additionally exhibiting a mild leukocytosis  with other potential SIRS criteria including tachycardia and tachypnea, will perform a basic infectious work-up including blood cultures, urine culture, urinalysis, procalcitonin and repeat chest imaging.  Based on the results of this work-up antibiotic therapy can then be considered.  Nursing performing frequent bedside assessments as well as vital signs and will notify me if there are any further clinical changes.   Vernelle Emerald  MD [facility]

## 2022-06-26 NOTE — Progress Notes (Signed)
   06/26/22 2002  Assess: MEWS Score  Temp (!) 100.4 F (38 C)  BP 94/85  MAP (mmHg) 92  Pulse Rate (!) 122  ECG Heart Rate (!) 122  Resp (!) 23  Level of Consciousness Alert  SpO2 90 %  O2 Device Nasal Cannula  Patient Activity (if Appropriate) In chair  O2 Flow Rate (L/min) 3 L/min  Assess: MEWS Score  MEWS Temp 0  MEWS Systolic 1  MEWS Pulse 2  MEWS RR 1  MEWS LOC 0  MEWS Score 4  MEWS Score Color Red  Assess: if the MEWS score is Yellow or Red  Were vital signs taken at a resting state? Yes  Focused Assessment Change from prior assessment (see assessment flowsheet)  Does the patient meet 2 or more of the SIRS criteria? Yes  Does the patient have a confirmed or suspected source of infection? Yes  Provider and Rapid Response Notified? Yes  MEWS guidelines implemented *See Row Information* Yes  Treat  MEWS Interventions Escalated (See documentation below)  Pain Scale 0-10  Pain Score 0  Take Vital Signs  Increase Vital Sign Frequency  Red: Q 1hr X 4 then Q 4hr X 4, if remains red, continue Q 4hrs  Escalate  MEWS: Escalate Red: discuss with charge nurse/RN and provider, consider discussing with RRT  Notify: Charge Nurse/RN  Name of Charge Nurse/RN Notified Lyda Kalata, RN  Date Charge Nurse/RN Notified 06/26/22  Time Charge Nurse/RN Notified 2042  Notify: Provider  Provider Name/Title Shalhoub, MD  Date Provider Notified 06/26/22  Time Provider Notified 2042  Method of Notification Page  Notification Reason Critical result  Provider response No new orders (awaiting response)  Notify: Rapid Response  Name of Rapid Response RN Notified Shanon Brow, RN  Date Rapid Response Notified 06/26/22  Time Rapid Response Notified 2045  Document  Patient Outcome Other (Comment)  Progress note created (see row info) Yes  Assess: SIRS CRITERIA  SIRS Temperature  0  SIRS Pulse 1  SIRS Respirations  1  SIRS WBC 1  SIRS Score Sum  3

## 2022-06-26 NOTE — Progress Notes (Signed)
Notified PA Marlyce Huge of HR sustaining in the upper 120's at 0800.  No new orders placed. Patient resting comfortably in bed.

## 2022-06-26 NOTE — Progress Notes (Signed)
Mobility Specialist Progress Note    06/26/22 1451  Mobility  Activity Ambulated independently in hallway  Level of Assistance Independent  Assistive Device None  Distance Ambulated (ft) 1200 ft  Activity Response Tolerated well  $Mobility charge 1 Mobility   Pre-Mobility: 112 HR During Mobility: 124 HR, >/=90% on 3LO2 SpO2 Post-Mobility: 108 HR  Pt received in bed and agreeable. No complaints on walk. Able to maintain SpO2 >/=90% on 3LO2. Returned to chair with call bell in reach. RN advised to leave pt on 3LO2.   Hildred Alamin Mobility Specialist

## 2022-06-26 NOTE — Assessment & Plan Note (Addendum)
Follow up Hgb is 7,5 with plt 332  Iron stores with serum iron 21. TIBC 549, ferritin 26 and transferrin saturation 4  Sp 2 units PRBC, follow up hgb is 8,7  No IV iron until completion of cardiac MRI

## 2022-06-26 NOTE — Progress Notes (Signed)
ANTICOAGULATION CONSULT NOTE - Initial Consult  Pharmacy Consult for Heparin Indication: chest pain/ACS  No Known Allergies  Patient Measurements: Height: 6' (182.9 cm) Weight: 72.2 kg (159 lb 2.8 oz) IBW/kg (Calculated) : 77.6  Vital Signs: Temp: 99 F (37.2 C) (09/07 0300) Temp Source: Oral (09/07 0300) BP: 112/74 (09/07 0300) Pulse Rate: 108 (09/06 2318)  Labs: Recent Labs    06/24/22 0809 06/24/22 1006 06/24/22 1143 06/24/22 1734 06/25/22 0028 06/25/22 0641  HGB 8.2*  --  8.1* 8.4*  --   --   HCT 28.5*  --  28.0* 28.7*  --   --   PLT 364  --   --   --   --   --   CREATININE 0.87  --   --   --   --  0.89  TROPONINIHS 139* 106*  --   --  1,477* 1,125*    Estimated Creatinine Clearance: 95.8 mL/min (by C-G formula based on SCr of 0.89 mg/dL).   Medical History: Past Medical History:  Diagnosis Date   Acid reflux    Concussion    Coronary artery disease    Diabetes (HCC)    ETOH abuse    High cholesterol    Hypertension    MI (myocardial infarction) ([place])    2012   Sleep apnea    USES CPAP AS NEEDED   Tobacco abuse     Medications:  Scheduled:   aspirin  325 mg Oral Daily   Chlorhexidine Gluconate Cloth  6 each Topical Daily   furosemide  40 mg Intravenous BID   insulin aspart  0-15 Units Subcutaneous TID WC   insulin aspart  0-5 Units Subcutaneous QHS   lisinopril  40 mg Oral Daily   pantoprazole (PROTONIX) IV  40 mg Intravenous Q24H   potassium chloride  40 mEq Oral Daily   rosuvastatin  20 mg Oral Daily   sodium chloride flush  3 mL Intravenous Q12H   spironolactone  12.5 mg Oral Daily    Assessment: 56 y.o. male with multivessel CAD s/p cath for heparin--to start 2 hours after TR band removal  Goal of Therapy:  Heparin level 0.3-0.7 units/ml Monitor platelets by anticoagulation protocol: Yes   Plan:  Start heparin 1100 units/hr at 0530 Check heparin level in 8 hours.   Caryl Pina 06/26/2022,3:24 AM

## 2022-06-26 NOTE — Assessment & Plan Note (Addendum)
Hyponatremia. Hypomagnesemia  Volume status has improved Follow up renal function with serum cr at 0,85 with K at 4,1 and serum bicarbonate at 27.   On spironolactone  Follow up renal function in am.

## 2022-06-26 NOTE — Progress Notes (Addendum)
Advanced Heart Failure Rounding Note  PCP-Cardiologist: None   Subjective:    09/06: Started milrinone 0.125 to facilitate diuresis in setting of low-output  CO-OX 49% in lab. CO-OX last night on milrinone 0.125 47%>>67%  No labs this am. CVP line malfunctioned and bleeding from R IJ central line just before I walked into room. RN at bedside assisting patient.  On IV lasix 40 BID. 2L UOP charted last 24 hrs. Weight down 3 lb.  BP stable.  Rhythm sinus/sinus tach 90s-110s yesterday, up to 120s this am. Checking ECG.  Feeling better but still having orthopnea. Abdominal bloating improved.    Objective:   Weight Range: 70.9 kg Body mass index is 21.2 kg/m.   Vital Signs:   Temp:  [97.9 F (36.6 C)-99.1 F (37.3 C)] 99 F (37.2 C) (09/07 0300) Pulse Rate:  [85-111] 108 (09/06 2318) Resp:  [18-38] 21 (09/07 0300) BP: (96-188)/(57-122) 112/74 (09/07 0300) SpO2:  [91 %-100 %] 94 % (09/07 0300) Weight:  [70.9 kg-72.2 kg] 70.9 kg (09/07 0358) Last BM Date : 06/25/22  Weight change: Filed Weights   06/25/22 0625 06/25/22 1329 06/26/22 0358  Weight: 72.2 kg 72.2 kg 70.9 kg    Intake/Output:   Intake/Output Summary (Last 24 hours) at 06/26/2022 0742 Last data filed at 06/26/2022 9147 Gross per 24 hour  Intake 549.39 ml  Output 2000 ml  Net -1450.61 ml      Physical Exam    General:  No distress. Sitting up in bed. HEENT: Normal Neck: Supple. JVP to jaw . Carotids 2+ bilat; no bruits. R IJ CVC. Cor: PMI nondisplaced. Regular rate & rhythm, tachy. No rubs, gallops, 3/6 MR murmur Lungs: Clear Abdomen: Soft, nontender, nondistended. Extremities: No cyanosis, clubbing, rash, 1+ edema Neuro: Alert & orientedx3, cranial nerves grossly intact. moves all 4 extremities w/o difficulty. Affect pleasant   Telemetry   Looks like sinus tach 110s-120s   Labs    CBC Recent Labs    06/24/22 0809 06/24/22 1143 06/24/22 1734  WBC 10.9*  --   --   HGB 8.2* 8.1* 8.4*   HCT 28.5* 28.0* 28.7*  MCV 77.0*  --   --   PLT 364  --   --    Basic Metabolic Panel Recent Labs    06/24/22 0809 06/24/22 1143 06/25/22 0641  NA 137  --  134*  K 3.4*  --  3.9  CL 99  --  95*  CO2 23  --  18*  GLUCOSE 295*  --  237*  BUN 17  --  15  CREATININE 0.87  --  0.89  CALCIUM 10.2  --  9.6  MG  --  1.5*  --    Liver Function Tests No results for input(s): "AST", "ALT", "ALKPHOS", "BILITOT", "PROT", "ALBUMIN" in the last 72 hours. No results for input(s): "LIPASE", "AMYLASE" in the last 72 hours. Cardiac Enzymes No results for input(s): "CKTOTAL", "CKMB", "CKMBINDEX", "TROPONINI" in the last 72 hours.  BNP: BNP (last 3 results) Recent Labs    06/24/22 0809 06/25/22 0133  BNP 639.0* 830.5*    ProBNP (last 3 results) No results for input(s): "PROBNP" in the last 8760 hours.   D-Dimer No results for input(s): "DDIMER" in the last 72 hours. Hemoglobin A1C Recent Labs    06/24/22 1046  HGBA1C 8.2*   Fasting Lipid Panel Recent Labs    06/25/22 0028  CHOL 140  HDL 43  LDLCALC 84  TRIG 66  CHOLHDL 3.3  Thyroid Function Tests No results for input(s): "TSH", "T4TOTAL", "T3FREE", "THYROIDAB" in the last 72 hours.  Invalid input(s): "FREET3"  Other results:   Imaging    DG Chest Port 1V same Day  Result Date: 06/25/2022 CLINICAL DATA:  Central venous catheter placement EXAM: PORTABLE CHEST 1 VIEW COMPARISON:  12:20 a.m. FINDINGS: Right internal jugular central venous catheter has been placed with its tip overlying the superior vena cava. Pulmonary insufflation is stable. Perihilar and lower lung zone pulmonary infiltrates, asymmetrically more severe within the left perihilar region appear improved since prior examination, again favoring improving pulmonary edema. Small right pleural effusion has decreased in the interval. No pneumothorax. Cardiac size is within normal limits. No acute bone abnormality. Healed left rib fractures again noted.  IMPRESSION: 1. Right internal jugular central venous catheter in appropriate position. No pneumothorax. 2. Improving pulmonary edema. 3. Decreasing small right pleural effusion. Electronically Signed   By: Fidela Salisbury M.D.   On: 06/25/2022 18:44   CARDIAC CATHETERIZATION  Result Date: 06/25/2022   Mid LM to Dist LM lesion is 75% stenosed.   Ost Cx to Prox Cx lesion is 90% stenosed.   Mid RCA lesion is 80% stenosed.   Prox RCA lesion is 50% stenosed.   Mid LAD lesion is 75% stenosed.   Mid Cx to Dist Cx lesion is 99% stenosed.   3rd Mrg lesion is 90% stenosed.   Ramus lesion is 80% stenosed.   Dist LAD lesion is 70% stenosed. 1.  Advanced multivessel cor Neri artery disease with severe distal left main disease, very severe ostial circumflex stenosis, subtotal occlusion of the mid circumflex with occlusion of the OM branches, severe in-stent restenosis in the mid LAD, diffuse distal LAD disease, and severe mid RCA stenosis 2.  Severe mitral regurgitation by noninvasive assessment and by hemodynamics with 34 mm V waves in the pulmonary wedge tracing 3.  Congestive heart failure with elevated wedge pressure of 26, low PA oxygen saturation of 49%, but preserved cardiac output of 5.4 L/min by Fick assessment 4.  Successful placement of right internal jugular triple-lumen catheter for infusion of milrinone and central monitoring if needed Plan: med rx initially. Will have to sort out best approach at revascularization and treatment of MR. Advanced HF team consulted.   ECHO TEE  Result Date: 06/25/2022    TRANSESOPHOGEAL ECHO REPORT   Patient Name:   [name] Date of Exam: 06/25/2022 Medical Rec #:  [id number]            Height:       72.0 in Accession #:    [id number]           Weight:       159.2 lb Date of Birth:  September 19, 1966             BSA:          1.934 m Patient Age:    56 years             BP:           104/80 mmHg Patient Gender: M                    HR:           90 bpm. Exam Location:  Inpatient  Procedure: Transesophageal Echo, Cardiac Doppler, Color Doppler and 3D Echo Indications:    Mitral regurgitation  History:        Patient has prior history of Echocardiogram examinations,  most                 recent 06/24/2022. Previous Myocardial Infarction and CAD; Risk                 Factors:Current Smoker, Sleep Apnea, Hypertension, ETOH and                 Diabetes.  Sonographer:    Eartha Inch Referring Phys: [id number] MARK C SKAINS PROCEDURE: After discussion of the risks and benefits of a TEE, an informed consent was obtained from the patient. The transesophogeal probe was passed without difficulty through the esophogus of the patient. Imaged were obtained with the patient in a supine position. Sedation performed by different physician. The patient was monitored while under deep sedation. Anesthestetic sedation was provided intravenously by Anesthesiology: 133.'03mg'$  of Propofol, '60mg'$  of Lidocaine. Image quality was good. The patient's vital signs; including heart rate, blood pressure, and oxygen saturation; remained stable throughout the procedure. The patient developed no complications during the procedure. IMPRESSIONS  1. Left ventricular ejection fraction, by estimation, is 40 to 45%. The left ventricle has mildly decreased function. The left ventricle demonstrates regional wall motion abnormalities (see scoring diagram/findings for description). There is mild left ventricular hypertrophy. There is severe hypokinesis of the left ventricular, mid-apical anterior wall, anterolateral wall and apical segment. There is moderate hypokinesis of the left ventricular, basal-mid inferior wall and inferolateral wall.  2. Right ventricular systolic function is normal. The right ventricular size is normal.  3. Left atrial size was severely dilated. No left atrial/left atrial appendage thrombus was detected.  4. Tethering of the posterior leaflet is suspected. The mitral valve is abnormal. Moderate to severe mitral valve  regurgitation.  5. The aortic valve is tricuspid. Aortic valve regurgitation is not visualized. FINDINGS  Left Ventricle: Left ventricular ejection fraction, by estimation, is 40 to 45%. The left ventricle has mildly decreased function. The left ventricle demonstrates regional wall motion abnormalities. Severe hypokinesis of the left ventricular, mid-apical  anterior wall, anterolateral wall and apical segment. Moderate hypokinesis of the left ventricular, basal-mid inferior wall and inferolateral wall. The left ventricular internal cavity size was normal in size. There is mild left ventricular hypertrophy. Right Ventricle: The right ventricular size is normal. No increase in right ventricular wall thickness. Right ventricular systolic function is normal. Left Atrium: Left atrial size was severely dilated. No left atrial/left atrial appendage thrombus was detected. Right Atrium: Right atrial size was normal in size. Pericardium: Trivial pericardial effusion is present. The pericardial effusion is posterior to the left ventricle. Mitral Valve: Tethering of the posterior leaflet is suspected. The mitral valve is abnormal. There is mild thickening of the anterior and posterior mitral valve leaflet(s). Moderate to severe mitral valve regurgitation, with centro/posteriorly directed. Tricuspid Valve: The tricuspid valve is grossly normal. Tricuspid valve regurgitation is mild. Aortic Valve: The aortic valve is tricuspid. Aortic valve regurgitation is not visualized. Pulmonic Valve: The pulmonic valve was grossly normal. Pulmonic valve regurgitation is not visualized. Aorta: The aortic root and ascending aorta are structurally normal, with no evidence of dilitation. IAS/Shunts: No atrial level shunt detected by color flow Doppler.  MR Peak grad:    63.0 mmHg MR Mean grad:    43.0 mmHg MR Vmax:         397.00 cm/s MR Vmean:        303.0 cm/s MR PISA:         3.08 cm MR PISA Eff ROA: 24 mm MR  PISA Radius:  0.70 cm Lyman Bishop MD Electronically signed by Lyman Bishop MD Signature Date/Time: 06/25/2022/3:08:05 PM    Final      Medications:     Scheduled Medications:  aspirin  325 mg Oral Daily   Chlorhexidine Gluconate Cloth  6 each Topical Daily   furosemide  40 mg Intravenous BID   insulin aspart  0-15 Units Subcutaneous TID WC   insulin aspart  0-5 Units Subcutaneous QHS   lisinopril  40 mg Oral Daily   pantoprazole (PROTONIX) IV  40 mg Intravenous Q24H   potassium chloride  40 mEq Oral Daily   rosuvastatin  20 mg Oral Daily   sodium chloride flush  3 mL Intravenous Q12H   spironolactone  12.5 mg Oral Daily    Infusions:  sodium chloride     heparin 1,150 Units/hr (06/26/22 0500)   milrinone 0.125 mcg/kg/min (06/25/22 1641)   nitroGLYCERIN Stopped (06/25/22 1506)    PRN Medications: sodium chloride, acetaminophen, guaiFENesin-dextromethorphan, ondansetron (ZOFRAN) IV, oxyCODONE, sodium chloride flush    Patient Profile   56 y/o AAM w/ h/o multivessel CAD s/p remote MI in 2012 and multiple PCIs to LAD, diag, ramus and OM vessels, HTN, HLD, Type 2 DM and tobacco use, admitted for NSTEMI and acute CHF. 2D Echo and TEE w/ severe mitral regurgitation. LVEF 40-45%, RV ok. Cath w/ multivessel CAD including high grade LM disease, elevated filling pressures and preserved CO.     Assessment/Plan   Severe Mitral Regurgitation  - ischemic MR in setting of severe multivessel CAD  - TEE w/ 3+ MR, severe leaflet malcoaptation due to posterior leaflet tethering - RHC w/ RA 8, PA 48/23 (38), PCWP mean 26, LVEDP 21, preserved CO but PA sat 49% - Now on milrinone 0.125 mcg to assist w/ lowering PA pressures.  - Set up CVC monitoring. Check CO-OX STAT - Increase IV lasix to 80 BID - Continue spiro 12.5 mg daily - BP too soft for entresto. Start Losartan 12.5 mg daily - No beta blocker for now with low-output - Follow am labs, pending - Sinus tach this am. Check ECG to confirm. Start digoxin 0.125. -  plan cMRI once better stabilized to check viability. If viable myocardium, will consult CT surgery for CABG + MVR    2. NSTEMI w/ Multivessel CAD  - Known hx prior CAD with MI in 2012 and prior multivessel PCI - HS trop peak 1,477 - LHC w/ severe MVCAD including high grade LM disease  - ASA + high intensity statin, heparin per cardiology   - cMRI for viability once optimized   3. Type 2DM  - per primary team  - A1c 8.2%  - Eventual SGLT2i - Will need good control of blood sugars if goes for CABG   4. IDA - Hgb 8.4 - per primary team - Anemia panel pending - hold off on giving feraheme until completion of cardiac MRI   5. ETOH and tobacco abuse - Watch for ETOH withdrawal   ADDENDUM:  CO-OX at 8:15 53% Will increase milrinone to 0.25 Watch BP closely  Length of Stay: 2  FINCH, LINDSAY N, PA-C  06/26/2022, 7:42 AM  Advanced Heart Failure Team Pager [phone] (M-F; 7a - 5p)  Please contact [facility] for night-coverage after hours (5p -7a ) and weekends on [email or web address]    Patient seen and examined with the above-signed Advanced Practice Provider and/or Housestaff. I personally reviewed laboratory data, imaging studies and relevant notes. I independently examined the  patient and formulated the important aspects of the plan. I have edited the note to reflect any of my changes or salient points. I have personally discussed the plan with the patient and/or family.  Co-ox low on milrinone 0.125 -> increased to 0.25. Diuresing modestly but still orthopneic. Remains tachycardic. ECG shows sinus tach. No CP.   General: Sitting up in bed. Short of breath  HEENT: normal Neck: supple. JVP to jaw Carotids 2+ bilat; no bruits. No lymphadenopathy or thryomegaly appreciated. Cor: PMI nondisplaced. Regular tachy 3/6 MR Lungs: clear Abdomen: soft, nontender, nondistended. No hepatosplenomegaly. No bruits or masses. Good bowel sounds. Extremities: no cyanosis, clubbing, rash, 1-2+  edema Neuro: alert & orientedx3, cranial nerves grossly intact. moves all 4 extremities w/o difficulty. Affect pleasant  Remains tenuous. Agree with increasing milrinone and IV lasix. Follow CVP and co-ox closely. Eventual cMRI with eye toward MVR/CABG  Do not give IV IRON until AFTER cMRI.   Glori Bickers, MD  8:42 AM

## 2022-06-26 NOTE — Progress Notes (Signed)
ANTICOAGULATION CONSULT NOTE - Initial Consult  Pharmacy Consult for Heparin Indication: chest pain/ACS  No Known Allergies  Patient Measurements: Height: 6' (182.9 cm) Weight: 70.9 kg (156 lb 4.9 oz) IBW/kg (Calculated) : 77.6  Vital Signs: Temp: 98.7 F (37.1 C) (09/07 1042) Temp Source: Oral (09/07 1042) BP: 120/80 (09/07 1042) Pulse Rate: 113 (09/07 1042)  Labs: Recent Labs    06/24/22 0809 06/24/22 1006 06/24/22 1143 06/24/22 1734 06/25/22 0028 06/25/22 0641 06/25/22 1518 06/26/22 0752 06/26/22 1410  HGB 8.2*  --    < > 8.4*  --   --  8.8* 7.5*  --   HCT 28.5*  --    < > 28.7*  --   --  26.0* 25.4*  --   PLT 364  --   --   --   --   --   --  332  --   HEPARINUNFRC  --   --   --   --   --   --   --   --  0.16*  CREATININE 0.87  --   --   --   --  0.89  --  0.92  --   TROPONINIHS 139* 106*  --   --  1,477* 1,125*  --   --   --    < > = values in this interval not displayed.     Estimated Creatinine Clearance: 91 mL/min (by C-G formula based on SCr of 0.92 mg/dL).   Medical History: Past Medical History:  Diagnosis Date   Acid reflux    Concussion    Coronary artery disease    Diabetes (HCC)    ETOH abuse    High cholesterol    Hypertension    MI (myocardial infarction) ([place])    2012   Sleep apnea    USES CPAP AS NEEDED   Tobacco abuse     Medications:  Scheduled:   aspirin EC  81 mg Oral Daily   Chlorhexidine Gluconate Cloth  6 each Topical Daily   digoxin  0.125 mg Oral Daily   feeding supplement  237 mL Oral BID BM   furosemide  80 mg Intravenous BID   insulin aspart  0-15 Units Subcutaneous TID WC   insulin aspart  0-5 Units Subcutaneous QHS   insulin glargine-yfgn  20 Units Subcutaneous Daily   losartan  12.5 mg Oral Daily   pantoprazole  40 mg Oral Daily   potassium chloride  40 mEq Oral TID   rosuvastatin  40 mg Oral Daily   sodium chloride flush  3 mL Intravenous Q12H   spironolactone  12.5 mg Oral Daily    Assessment: 56 y.o.  male with multivessel CAD s/p cath for heparin--to start 2 hours after TR band removal.  Heparin level came back subtherapeutic this PM at 0.16. We will increase rate while pending decision for the next step.  Goal of Therapy:  Heparin level 0.3-0.7 units/ml Monitor platelets by anticoagulation protocol: Yes   Plan:  Increase heparin to 1300 units/hr Check 6 hr HL  Onnie Boer, PharmD, [facility], AAHIVP, CPP Infectious Disease Pharmacist 06/26/2022 4:02 PM

## 2022-06-26 NOTE — Progress Notes (Signed)
Progress Note   Patient: [name] PJS:[id number] DOB: 02-13-1966 DOA: 06/24/2022     2 DOS: the patient was seen and examined on 06/26/2022   Brief hospital course: [name] was admitted to the hospital with the working diagnosis of decompensated heart failure.  56 yo male with the past medical history of coronary artery disease, hypertension, and dyslipidemia who presented with chest pain. Intermittent chest pain, left sided pressure and radiated to the right. Because persistent symptoms he came to the ED. On his initial physical evaluation his blood pressure was 111/80, HR 89, RR 26 and 02 saturation 93%, lungs with rales but not wheezing, heart with S1 and S2 present and rhythmic, abdomen with no distention and positive lower extremity edema   Na 137, K 3,4 Cl 99, bicarbonate 23, glucose 295 bun 17 cr 0,87  BNP 639 High sensitive troponin 139  Wbc 10,9 hgb 8,2 plt 364   Chest radiograph with bilateral hilar vascular congestion, bilateral interstitial infiltrates more at the lower lobes, cephalization of the vasculature and small bilateral pleural effusions.   EKG 116 bpm, normal axis and normal intervals, sinus rhythm with V5 and V6 ST depression with no significant T wave changes, poor R R wave progression.   Echocardiogram with reduced LV systolic function, positive Mitral regurgitation and severe multivessel CAD and IDA   Patient has been placed on inotropic support and diuresis with furosemide   Assessment and Plan: Acute systolic heart failure (HCC) Echocardiogram with left ventricle EF 40 to 45%, mild dilated internal cavity, moderate hypokinesis of the left ventricular, basal mid inferolateral wall and anterolateral wall. RV systolic function is preserved. LA with severe dilatation. Severe mitral regurgitation   TEE with moderate to severe mitral regurgitation   09/06 cardiac catheterization with advance multivessel coronary artery disease.  Severe mitral  regurgitation.  PCWP 26 Cardiac output 5,4 per Fick.  Urine output is 2000 cc over last 24 hrs Blood pressure systolic 292 to 446 mmHg.  SV02 52.7%  Continue inotropic support with milrinone  Continue diuresis with furosemide 80 mg IV q12 hrs and spironolactone  ARB with losartan Continue with digoxin  Plan for further work up with cardiac MRI.    CAD (coronary artery disease) Multivessel coronary artery disease NSTEMI  Medical therapy with IV heparin, aspirin and statin Continue blood pressure control with losartan.  No B blockade due to low output heart failure decompensation.    Continue with statin therapy and aspirin.   Hypertension Continue blood pressure support with milrinone  Patient has been placed on losartan with good toleration.    Type 2 diabetes mellitus with hyperlipidemia (HCC) Hyperglycemia.   Continue insulin sliding scale for glucose cover and monitoring Resume basal insulin with glargine 20 units.   Continue with statin therapy  GERD (gastroesophageal reflux disease) Continue with pantoprazole  Microcytic anemia Cell count stable   Tobacco abuse Smoking cessation   Hypokalemia Hyponatremia. Hypomagnesemia  Renal function with serum cr at 0,92, K is 3,3 and serum bicarbonate at 28 Na 134 and Mg at 1,3  Plan to continue K and Mg correction Continue diuresis with furosemide and spironolactone Follow up renal function and electrolytes in am.   Iron deficiency anemia Follow up Hgb is 7,5 with plt 332  Iron stores with serum iron 21. TIBC 549, ferritin 26 and transferrin saturation 4  Plan to add IV iron after completion of heart MRI.  No current indication for PRBC transfusion.   Reactive leukocytosis with wbc at  11,8         Subjective: Patient is feeling better than yesterday but not back to his baseline, dyspnea and edema continue present but less intensity   Physical Exam: Vitals:   06/26/22 0358 06/26/22 0753 06/26/22 0901  06/26/22 1042  BP:  107/67  120/80  Pulse:  (!) 117 (!) 122 (!) 113  Resp:  17  (!) 24  Temp:  98.7 F (37.1 C)  98.7 F (37.1 C)  TempSrc:  Oral  Oral  SpO2:  95%  100%  Weight: 70.9 kg     Height:       Neurology awake and alert ENT with mild pallor Cardiovascular with  S1 and S2 present and systolic murmur at the apex, no gallops Mild JVD Positive lower extremity edema more right than left Respiratory with rales at bases with no wheezing Abdomen with no distention  Data Reviewed:    Family Communication: no family at the bedside   Disposition: Status is: Inpatient Remains inpatient appropriate because: heart failure with low output on inotropic therapy   Planned Discharge Destination: Home    Author: Tawni Millers, MD 06/26/2022 12:51 PM  For on call review [email or web address].

## 2022-06-26 NOTE — Progress Notes (Signed)
ANTICOAGULATION CONSULT NOTE  Pharmacy Consult for Heparin Indication: chest pain/ACS  No Known Allergies  Patient Measurements: Height: 6' (182.9 cm) Weight: 70.9 kg (156 lb 4.9 oz) IBW/kg (Calculated) : 77.6  Vital Signs: Temp: 100.4 F (38 C) (09/07 2002) Temp Source: Oral (09/07 2002) BP: 94/85 (09/07 2002) Pulse Rate: 122 (09/07 2002)  Labs: Recent Labs    06/24/22 0809 06/24/22 1006 06/24/22 1143 06/24/22 1734 06/25/22 0028 06/25/22 0641 06/25/22 1518 06/26/22 0752 06/26/22 1410 06/26/22 2247  HGB 8.2*  --    < > 8.4*  --   --  8.8* 7.5*  --   --   HCT 28.5*  --    < > 28.7*  --   --  26.0* 25.4*  --   --   PLT 364  --   --   --   --   --   --  332  --   --   HEPARINUNFRC  --   --   --   --   --   --   --   --  0.16* 0.35  CREATININE 0.87  --   --   --   --  0.89  --  0.92  --   --   TROPONINIHS 139* 106*  --   --  1,477* 1,125*  --   --   --   --    < > = values in this interval not displayed.     Estimated Creatinine Clearance: 91 mL/min (by C-G formula based on SCr of 0.92 mg/dL).  Assessment: 56 y.o. male with multivessel CAD s/p cath for heparin  Goal of Therapy:  Heparin level 0.3-0.7 units/ml Monitor platelets by anticoagulation protocol: Yes   Plan:  Continue Heparin at current rate   Caryl Pina 06/26/2022,11:20 PM

## 2022-06-26 NOTE — Progress Notes (Signed)
Heart Failure Navigator Progress Note  Assessed for Heart & Vascular TOC clinic readiness.  Patient does not meet criteria due to Advanced Heart failure Team patient.      [name], BSN, Clinical cytogeneticist Only

## 2022-06-27 ENCOUNTER — Inpatient Hospital Stay (HOSPITAL_COMMUNITY): Payer: No Typology Code available for payment source

## 2022-06-27 DIAGNOSIS — J189 Pneumonia, unspecified organism: Secondary | ICD-10-CM | POA: Diagnosis present

## 2022-06-27 DIAGNOSIS — I10 Essential (primary) hypertension: Secondary | ICD-10-CM

## 2022-06-27 LAB — GLUCOSE, CAPILLARY
Glucose-Capillary: 271 mg/dL — ABNORMAL HIGH (ref 70–99)
Glucose-Capillary: 376 mg/dL — ABNORMAL HIGH (ref 70–99)
Glucose-Capillary: 322 mg/dL — ABNORMAL HIGH (ref 70–99)
Glucose-Capillary: 336 mg/dL — ABNORMAL HIGH (ref 70–99)

## 2022-06-27 LAB — CBC
HCT: 24.8 % — ABNORMAL LOW (ref 39.0–52.0)
Hemoglobin: 7.4 g/dL — ABNORMAL LOW (ref 13.0–17.0)
MCH: 22.6 pg — ABNORMAL LOW (ref 26.0–34.0)
MCHC: 29.8 g/dL — ABNORMAL LOW (ref 30.0–36.0)
MCV: 75.8 fL — ABNORMAL LOW (ref 80.0–100.0)
Platelets: 311 10*3/uL (ref 150–400)
RBC: 3.27 MIL/uL — ABNORMAL LOW (ref 4.22–5.81)
RDW: 20.6 % — ABNORMAL HIGH (ref 11.5–15.5)
WBC: 15.5 10*3/uL — ABNORMAL HIGH (ref 4.0–10.5)
nRBC: 0 % (ref 0.0–0.2)

## 2022-06-27 LAB — RESPIRATORY PANEL BY PCR

## 2022-06-27 LAB — BASIC METABOLIC PANEL
Anion gap: 9 (ref 5–15)
BUN: 10 mg/dL (ref 6–20)
CO2: 26 mmol/L (ref 22–32)
Calcium: 8.6 mg/dL — ABNORMAL LOW (ref 8.9–10.3)
Chloride: 97 mmol/L — ABNORMAL LOW (ref 98–111)
Creatinine, Ser: 0.81 mg/dL (ref 0.61–1.24)
GFR, Estimated: >60 mL/min (ref >60)
Glucose, Bld: 269 mg/dL — ABNORMAL HIGH (ref 70–99)
Potassium: 3.8 mmol/L (ref 3.5–5.1)
Sodium: 132 mmol/L — ABNORMAL LOW (ref 135–145)

## 2022-06-27 LAB — COOXEMETRY PANEL
Carboxyhemoglobin: 2 % — ABNORMAL HIGH (ref 0.5–1.5)
Methemoglobin: 1 % (ref 0.0–1.5)
O2 Saturation: 54.3 %
Total hemoglobin: 7.7 g/dL — ABNORMAL LOW (ref 12.0–16.0)

## 2022-06-27 LAB — MAGNESIUM: Magnesium: 1.8 mg/dL (ref 1.7–2.4)

## 2022-06-27 LAB — HEPARIN LEVEL (UNFRACTIONATED): Heparin Unfractionated: 0.3 IU/mL (ref 0.30–0.70)

## 2022-06-27 LAB — LIPOPROTEIN A (LPA): Lipoprotein (a): 153.9 nmol/L — ABNORMAL HIGH (ref <75.0)

## 2022-06-27 MED ORDER — MAGNESIUM SULFATE 2 GM/50ML IV SOLN
2.0000 g | Freq: Once | INTRAVENOUS | Status: AC
Start: 2022-06-27 — End: 2022-06-27
  Administered 2022-06-27: 2 g via INTRAVENOUS
  Filled 2022-06-27: qty 50

## 2022-06-27 MED ORDER — POTASSIUM CHLORIDE CRYS ER 20 MEQ PO TBCR
40.0000 meq | EXTENDED_RELEASE_TABLET | Freq: Two times a day (BID) | ORAL | Status: DC
  Administered 2022-06-27: 40 meq via ORAL
  Filled 2022-06-27: qty 2

## 2022-06-27 MED ORDER — INSULIN GLARGINE-YFGN 100 UNIT/ML ~~LOC~~ SOLN
30.0000 [IU] | Freq: Every day | SUBCUTANEOUS | Status: DC
  Filled 2022-06-27: qty 0.3

## 2022-06-27 MED ORDER — SACUBITRIL-VALSARTAN 24-26 MG PO TABS
1.0000 | ORAL_TABLET | Freq: Two times a day (BID) | ORAL | Status: DC
  Administered 2022-06-27 – 2022-06-30 (×8): 1 via ORAL
  Filled 2022-06-27 (×8): qty 1

## 2022-06-27 MED ORDER — AZITHROMYCIN 500 MG PO TABS
500.0000 mg | ORAL_TABLET | Freq: Every day | ORAL | Status: AC
Start: 2022-06-27 — End: 2022-07-01
  Administered 2022-06-27 – 2022-07-01 (×5): 500 mg via ORAL
  Filled 2022-06-27 (×5): qty 1

## 2022-06-27 MED ORDER — FUROSEMIDE 10 MG/ML IJ SOLN
80.0000 mg | Freq: Two times a day (BID) | INTRAMUSCULAR | Status: DC
  Administered 2022-06-28 (×2): 80 mg via INTRAVENOUS
  Filled 2022-06-27 (×2): qty 8

## 2022-06-27 MED ORDER — INSULIN ASPART 100 UNIT/ML IJ SOLN
3.0000 [IU] | Freq: Three times a day (TID) | INTRAMUSCULAR | Status: DC
Start: 2022-06-27 — End: 2022-06-28
  Administered 2022-06-27: 3 [IU] via SUBCUTANEOUS

## 2022-06-27 MED ORDER — INSULIN GLARGINE-YFGN 100 UNIT/ML ~~LOC~~ SOLN
10.0000 [IU] | Freq: Once | SUBCUTANEOUS | Status: AC
  Administered 2022-06-27: 10 [IU] via SUBCUTANEOUS
  Filled 2022-06-27: qty 0.1

## 2022-06-27 MED ORDER — SODIUM CHLORIDE 0.9 % IV SOLN
2.0000 g | INTRAVENOUS | Status: AC
  Administered 2022-06-27 – 2022-07-01 (×5): 2 g via INTRAVENOUS
  Filled 2022-06-27 (×5): qty 20

## 2022-06-27 NOTE — Progress Notes (Addendum)
Progress Note   Patient: [name] IRS:[id number] DOB: 25-Oct-1965 DOA: 06/24/2022     3 DOS: the patient was seen and examined on 06/27/2022   Brief hospital course: [name] was admitted to the hospital with the working diagnosis of decompensated heart failure.  56 yo male with the past medical history of coronary artery disease, hypertension, and dyslipidemia who presented with chest pain. Intermittent chest pain, left sided pressure and radiated to the right. Because persistent symptoms he came to the ED. On his initial physical evaluation his blood pressure was 111/80, HR 89, RR 26 and 02 saturation 93%, lungs with rales but not wheezing, heart with S1 and S2 present and rhythmic, abdomen with no distention and positive lower extremity edema   Na 137, K 3,4 Cl 99, bicarbonate 23, glucose 295 bun 17 cr 0,87  BNP 639 High sensitive troponin 139  Wbc 10,9 hgb 8,2 plt 364   Chest radiograph with bilateral hilar vascular congestion, bilateral interstitial infiltrates more at the lower lobes, cephalization of the vasculature and small bilateral pleural effusions.   EKG 116 bpm, normal axis and normal intervals, sinus rhythm with V5 and V6 ST depression with no significant T wave changes, poor R R wave progression.   Echocardiogram with reduced LV systolic function, positive Mitral regurgitation and severe multivessel CAD and IDA   Patient has been placed on inotropic support and diuresis with furosemide  09/07 positive fever.     Assessment and Plan: Acute systolic heart failure (HCC) Echocardiogram with left ventricle EF 40 to 45%, mild dilated internal cavity, moderate hypokinesis of the left ventricular, basal mid inferolateral wall and anterolateral wall. RV systolic function is preserved. LA with severe dilatation. Severe mitral regurgitation   TEE with moderate to severe mitral regurgitation   09/06 cardiac catheterization with advance multivessel coronary artery  disease.  Severe mitral regurgitation.  PCWP 26 Cardiac output 5,4 per Fick.  Urine output is 3750 cc over last 24 hrs Blood pressure systolic 98 to 009 mmHg.  SV02 54  Continue inotropic support with milrinone at 0.25 Diuresis with furosemide 80 mg IV q12 hrs and spironolactone  Transitioned to entresto.  Continue with digoxin  Plan for further work up with cardiac MRI.    CAD (coronary artery disease) Multivessel coronary artery disease NSTEMI  Continue with aspirin and statin Continue blood pressure control with losartan.  No B blockade due to low output heart failure decompensation.   Patient has been on heparin for 48 hrs, will plan to complete 72 hrs before discontinue .  Continue with statin therapy and aspirin.   Hypertension Continue blood pressure support with milrinone  Patient has been placed on losartan with good toleration.    Type 2 diabetes mellitus with hyperlipidemia (HCC) Hyperglycemia.   Glucose continue not well controlled, patient has required 24 units of short acting insulin. Plan to increase basal insulin to 30 units and add pre meal insulin 3 units Continue with insulin sliding scale for glucose cover and monitoring Patient is tolerating po well.   Continue with statin therapy  Hypokalemia Hyponatremia. Hypomagnesemia  Patient is responding to diuresis Renal function with serum cr at 0,81, K is 3,8 and serum bicarbonate at 26 Mg 1,8 and Na 132   Plan to continue K and Mg correction Continue diuresis with furosemide and spironolactone Follow up renal function and electrolytes in am.   GERD (gastroesophageal reflux disease) Continue with pantoprazole  Microcytic anemia Cell count stable   Tobacco abuse Smoking  cessation   Iron deficiency anemia Follow up Hgb is 7,5 with plt 332  Iron stores with serum iron 21. TIBC 549, ferritin 26 and transferrin saturation 4  Plan to add IV iron after completion of heart MRI.  No current  indication for PRBC transfusion.   Left upper lobe pneumonia Patient with reactive leukocytosis He had a fever last night 100.4, since then he has been afebrile  Blood cultures no growth Respiratory panel is negative  Chest radiograph with bilateral interstitial infiltrates with cephalization of the vasculature and hilar congestion. Infiltrates more dense at the left upper lobe.  Follow up wbc is up to 15.   Considering patients acute illness and high risk for decompensation will start with IV antibiotic therapy with cephalosporin and oral macrolide.  Suspected left upper lobe pneumonia (present on admission) Follow up with non contrast CT chest.          Subjective: Patient with improved dyspnea and edema but not back to baseline, no chest pain, no nausea or vomiting   Physical Exam: Vitals:   06/27/22 0432 06/27/22 0900 06/27/22 0913 06/27/22 1148  BP: (!) 128/93 124/78  121/81  Pulse:  (!) 119 (!) 123 (!) 119  Resp: (!) 22 (!) 30  19  Temp: 98.7 F (37.1 C) 99 F (37.2 C)  98.8 F (37.1 C)  TempSrc: Oral Oral  Oral  SpO2: 91% 93%  95%  Weight: 70.8 kg     Height:       Neurology awake and alert ENT with mild pallor Cardiovascular with S1 and S2 present and rhythmic with positive murmur at the apex Mild JVD Positive lower extremity edema ++ Respiratory with rales bilaterally with no wheezing or rhonchi Abdomen with no distention  Data Reviewed:    Family Communication: no family at the bedside   Disposition: Status is: Inpatient Remains inpatient appropriate because: heart failure   Planned Discharge Destination: Home      Author: Tawni Millers, MD 06/27/2022 3:59 PM  For on call review [email or web address].

## 2022-06-27 NOTE — Progress Notes (Signed)
Mobility Specialist Progress Note    06/27/22 1105  Mobility  Activity Ambulated independently in hallway  Level of Assistance Independent  Assistive Device None  Distance Ambulated (ft) 420 ft  Activity Response Tolerated well  $Mobility charge 1 Mobility   Pre-Mobility: 123 HR During Mobility: 135 HR Post-Mobility: 130 HR, 126/77 BP  Pt received in bed and agreeable. C/o fatigue. On 6LO2. Returned to chair with call bell in reach.    Hildred Alamin Mobility Specialist

## 2022-06-27 NOTE — Progress Notes (Addendum)
Advanced Heart Failure Rounding Note  PCP-Cardiologist: None   Subjective:    09/06: Started milrinone 0.125 to facilitate diuresis in setting of low-output  Yesterday milrinone increased to 0.25 mcg. CO-OX 54%. Diuresed with IV lasix. Brisk diuresis noted.  Overnight developed worsening cough. Bld CX obtained. Procalcitonin 0.28. CRP 18.8. WBC up to 15.5. Tmax 100.4.   Complaining of cough. Had a hard time sleeping.    Objective:   Weight Range: 70.8 kg Body mass index is 21.17 kg/m.   Vital Signs:   Temp:  [98.7 F (37.1 C)-100.4 F (38 C)] 98.7 F (37.1 C) (09/08 0432) Pulse Rate:  [100-122] 100 (09/07 2354) Resp:  [17-24] 22 (09/08 0432) BP: (94-128)/(67-93) 128/93 (09/08 0432) SpO2:  [90 %-100 %] 91 % (09/08 0432) Weight:  [70.8 kg] 70.8 kg (09/08 0432) Last BM Date : 06/25/22  Weight change: Filed Weights   06/25/22 1329 06/26/22 0358 06/27/22 0432  Weight: 72.2 kg 70.9 kg 70.8 kg    Intake/Output:   Intake/Output Summary (Last 24 hours) at 06/27/2022 0706 Last data filed at 06/27/2022 0438 Gross per 24 hour  Intake 150.26 ml  Output 3750 ml  Net -3599.74 ml     CVP 6 personally checked,  Physical Exam   General:   No resp difficulty HEENT: normal Neck: supple. no JVD. Carotids 2+ bilat; no bruits. No lymphadenopathy or thryomegaly appreciated.RIJ Cor: PMI nondisplaced. Tachy Regular rate & rhythm. No rubs, gallops or murmurs. Lungs: EW . Decreased on R. On 3 liters [place] Abdomen: soft, nontender, nondistended. No hepatosplenomegaly. No bruits or masses. Good bowel sounds. Extremities: no cyanosis, clubbing, rash, edema Neuro: alert & orientedx3, cranial nerves grossly intact. moves all 4 extremities w/o difficulty. Affect pleasant   Telemetry   Sinus Tach 100-120s    Labs    CBC Recent Labs    06/26/22 0752 06/27/22 0430  WBC 11.8* 15.5*  HGB 7.5* 7.4*  HCT 25.4* 24.8*  MCV 75.4* 75.8*  PLT 332 660   Basic Metabolic Panel Recent  Labs    06/26/22 0752 06/27/22 0430  NA 134* 132*  K 3.3* 3.8  CL 93* 97*  CO2 28 26  GLUCOSE 336* 269*  BUN 13 10  CREATININE 0.92 0.81  CALCIUM 9.2 8.6*  MG 1.3* 1.8   Liver Function Tests No results for input(s): "AST", "ALT", "ALKPHOS", "BILITOT", "PROT", "ALBUMIN" in the last 72 hours. No results for input(s): "LIPASE", "AMYLASE" in the last 72 hours. Cardiac Enzymes No results for input(s): "CKTOTAL", "CKMB", "CKMBINDEX", "TROPONINI" in the last 72 hours.  BNP: BNP (last 3 results) Recent Labs    06/24/22 0809 06/25/22 0133  BNP 639.0* 830.5*    ProBNP (last 3 results) No results for input(s): "PROBNP" in the last 8760 hours.   D-Dimer No results for input(s): "DDIMER" in the last 72 hours. Hemoglobin A1C Recent Labs    06/24/22 1046  HGBA1C 8.2*   Fasting Lipid Panel Recent Labs    06/25/22 0028  CHOL 140  HDL 43  LDLCALC 84  TRIG 66  CHOLHDL 3.3   Thyroid Function Tests No results for input(s): "TSH", "T4TOTAL", "T3FREE", "THYROIDAB" in the last 72 hours.  Invalid input(s): "FREET3"  Other results:   Imaging    DG Chest 1 View  Result Date: 06/26/2022 CLINICAL DATA:  Encounter for pneumonia. EXAM: CHEST  1 VIEW COMPARISON:  Chest radiograph dated 06/25/2022. FINDINGS: Right IJ central venous line with tip over central SVC. Persistent bilateral pulmonary densities relatively  similar to prior radiograph. Probable trace pleural effusions. No pneumothorax. Stable cardiomegaly. Coronary vascular calcification. No acute osseous pathology. Degenerative changes of the spine. IMPRESSION: 1. Persistent bilateral pulmonary densities may represent edema or pneumonia. 2. Stable cardiomegaly. Electronically Signed   By: Anner Crete M.D.   On: 06/26/2022 21:42     Medications:     Scheduled Medications:  aspirin EC  81 mg Oral Daily   Chlorhexidine Gluconate Cloth  6 each Topical Daily   digoxin  0.125 mg Oral Daily   feeding supplement  237 mL  Oral BID BM   furosemide  80 mg Intravenous BID   insulin aspart  0-15 Units Subcutaneous TID WC   insulin aspart  0-5 Units Subcutaneous QHS   insulin glargine-yfgn  20 Units Subcutaneous Daily   losartan  12.5 mg Oral Daily   pantoprazole  40 mg Oral Daily   rosuvastatin  40 mg Oral Daily   sodium chloride flush  3 mL Intravenous Q12H   spironolactone  12.5 mg Oral Daily    Infusions:  sodium chloride     heparin 1,300 Units/hr (06/27/22 0140)   milrinone 0.25 mcg/kg/min (06/26/22 1557)   nitroGLYCERIN Stopped (06/25/22 1506)    PRN Medications: sodium chloride, acetaminophen, guaiFENesin-dextromethorphan, ondansetron (ZOFRAN) IV, oxyCODONE, sodium chloride flush    Patient Profile   56 y/o AAM w/ h/o multivessel CAD s/p remote MI in 2012 and multiple PCIs to LAD, diag, ramus and OM vessels, HTN, HLD, Type 2 DM and tobacco use, admitted for NSTEMI and acute CHF. 2D Echo and TEE w/ severe mitral regurgitation. LVEF 40-45%, RV ok. Cath w/ multivessel CAD including high grade LM disease, elevated filling pressures and preserved CO.     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 CO-OX remains marginal 54%. Hgb down to 7.4 today.  -Volume status improved. CVP 7 . Give one more dose of IV lasix.  - Continue spiro 12.5 mg daily - BP too soft for entresto. Continue Losartan 12.5 mg daily - No beta blocker for now with low-output -- plan cMRI once better stabilized to check viability. If viable myocardium, will consult CT surgery for CABG + MVR    2. NSTEMI w/ Multivessel CAD  - Known hx prior CAD with MI in 2012 and prior multivessel PCI - HS trop peak 1,477 - LHC w/ severe MVCAD including high grade LM disease  - ASA + high intensity  statin, heparin per cardiology   - cMRI for viability once optimized   3. Type 2DM  - per primary team  - A1c 8.2%  - Eventual SGLT2i - Will need good control of blood sugars if goes for CABG   4. IDA - Hgb 8.4---> 7.4 today . Obtain type and screen.  - no obvious source blood loss.  - per primary team - Iron  21, sats 4%.  - hold off on giving feraheme until completion of cardiac MRI   5. ETOH and tobacco abuse - Watch for ETOH withdrawal  6. ID - Worsening cough and with slight wheeze. CVP down to 7 -WBC trending up 11.8>15. Tmax 100.4 -Procalcitonin 0.28 - CRP 18.8  - 9/7 Blood CX - no growth CVP down 7 today.  -  CXR ? Possible PNA - Add IS .  - Check respiratory panel.     Do not give IV IRON until AFTER cMRI.   Length of Stay: 3  Amy Clegg, NP  06/27/2022, 7:06 AM  Advanced Heart Failure Team Pager [phone] (M-F; 7a - 5p)  Please contact [facility] for night-coverage after hours (5p -7a ) and weekends on [email or web address]   Patient seen and examined with the above-signed Advanced Practice Provider and/or Housestaff. I personally reviewed laboratory data, imaging studies and relevant notes. I independently examined the patient and formulated the important aspects of the plan. I have edited the note to reflect any of my changes or salient points. I have personally discussed the plan with the patient and/or family.  On milrinone 0.25. Co-ox marginal. CVP 4. Developed cough and respiratory distress last night with low-grade fever  CXR with multifocal PNA (worst on left) vs edema  General:  Sitting up in bed . No resp difficulty HEENT: normal Neck: supple. no JVD. Carotids 2+ bilat; no bruits. No lymphadenopathy or thryomegaly appreciated. Cor: PMI nondisplaced. Regular rate & rhythm. 3+ MR Lungs: + crackles Abdomen: soft, nontender, nondistended. No hepatosplenomegaly. No bruits or masses. Good bowel sounds. Extremities: no cyanosis, clubbing, rash, edema Neuro:  alert & orientedx3, cranial nerves grossly intact. moves all 4 extremities w/o difficulty. Affect pleasant  CXR suspicious for PNA (vs edema from MR). Will get chest CT. Continue milrinone. Hold lasix. Switch losartan to Entresto. cMRI on Monday.   Glori Bickers, MD  9:33 AM

## 2022-06-27 NOTE — Plan of Care (Signed)
  Problem: Education: Goal: Knowledge of General Education information will improve Description: Including pain rating scale, medication(s)/side effects and non-pharmacologic comfort measures Outcome: Progressing   Problem: Health Behavior/Discharge Planning: Goal: Ability to manage health-related needs will improve Outcome: Progressing   Problem: Clinical Measurements: Goal: Cardiovascular complication will be avoided Outcome: Progressing   Problem: Activity: Goal: Risk for activity intolerance will decrease Outcome: Progressing   Problem: Coping: Goal: Level of anxiety will decrease Outcome: Progressing   

## 2022-06-27 NOTE — Progress Notes (Signed)
Inpatient Diabetes Program Recommendations  AACE/ADA: New Consensus Statement on Inpatient Glycemic Control (2015)  Target Ranges:  Prepandial:   less than 140 mg/dL      Peak postprandial:   less than 180 mg/dL (1-2 hours)      Critically ill patients:  140 - 180 mg/dL   Lab Results  Component Value Date   GLUCAP 271 (H) 06/27/2022   HGBA1C 8.2 (H) 06/24/2022    Review of Glycemic Control  Latest Reference Range & Units 06/26/22 10:48 06/26/22 16:34 06/26/22 21:20 06/27/22 06:35  Glucose-Capillary 70 - 99 mg/dL 313 (H) 239 (H) 421 (H) 271 (H)   Diabetes history: DM 2 Outpatient Diabetes medications:  Jardiance 12.5 mg daily, Lantus 50 units q HS, Metformin XR 500 mg bid Current orders for Inpatient glycemic control:  Novolog 0-15 units tid with meals and HS Semglee 20 units daily  Inpatient Diabetes Program Recommendations:    Consider increasing Semglee to 25 units daily and add Novolog meal coverage 4 units tid with meals  (hold if patient eats less than 50% or NPO).   Thanks,  Adah Perl, RN, BC-ADM Inpatient Diabetes Coordinator Pager [phone]

## 2022-06-27 NOTE — Progress Notes (Addendum)
ANTICOAGULATION CONSULT NOTE   Pharmacy Consult for Heparin Indication: chest pain/ACS  No Known Allergies  Patient Measurements: Height: 6' (182.9 cm) Weight: 70.8 kg (156 lb 1.4 oz) IBW/kg (Calculated) : 77.6  Vital Signs: Temp: 98.7 F (37.1 C) (09/08 0432) Temp Source: Oral (09/08 0432) BP: 128/93 (09/08 0432) Pulse Rate: 100 (09/07 2354)  Labs: Recent Labs    06/24/22 0809 06/24/22 1006 06/24/22 1143 06/25/22 0028 06/25/22 0641 06/25/22 1518 06/26/22 0752 06/26/22 1410 06/26/22 2247 06/27/22 0430  HGB 8.2*  --    < >  --   --  8.8* 7.5*  --   --  7.4*  HCT 28.5*  --    < >  --   --  26.0* 25.4*  --   --  24.8*  PLT 364  --   --   --   --   --  332  --   --  311  HEPARINUNFRC  --   --   --   --   --   --   --  0.16* 0.35 0.30  CREATININE 0.87  --   --   --  0.89  --  0.92  --   --  0.81  TROPONINIHS 139* 106*  --  1,477* 1,125*  --   --   --   --   --    < > = values in this interval not displayed.     Estimated Creatinine Clearance: 103.2 mL/min (by C-G formula based on SCr of 0.81 mg/dL).   Medical History: Past Medical History:  Diagnosis Date   Acid reflux    Concussion    Coronary artery disease    Diabetes (HCC)    ETOH abuse    High cholesterol    Hypertension    MI (myocardial infarction) ([place])    2012   Sleep apnea    USES CPAP AS NEEDED   Tobacco abuse     Medications:  Scheduled:   aspirin EC  81 mg Oral Daily   Chlorhexidine Gluconate Cloth  6 each Topical Daily   digoxin  0.125 mg Oral Daily   feeding supplement  237 mL Oral BID BM   furosemide  80 mg Intravenous BID   insulin aspart  0-15 Units Subcutaneous TID WC   insulin aspart  0-5 Units Subcutaneous QHS   insulin glargine-yfgn  20 Units Subcutaneous Daily   losartan  12.5 mg Oral Daily   pantoprazole  40 mg Oral Daily   rosuvastatin  40 mg Oral Daily   sodium chloride flush  3 mL Intravenous Q12H   spironolactone  12.5 mg Oral Daily    Assessment: 56 y.o. male with  multivessel CAD s/p cath for heparin--to start 2 hours after TR band removal.  Heparin level continues to be at goal (0.3) on 1300 units/hr. Currently at lower end of goal so will adjust to keep within goal. Of note hemoglobin is down to 7.4, plt wnl. No bleeding issues noted overnight.   Goal of Therapy:  Heparin level 0.3-0.7 units/ml Monitor platelets by anticoagulation protocol: Yes   Plan:  Increase heparin to 1400 units/hr Daily heparin level  Erin Hearing PharmD., BCPS Clinical Pharmacist 06/27/2022 7:58 AM

## 2022-06-27 NOTE — Assessment & Plan Note (Addendum)
Patient with reactive leukocytosis He had a fever last night 100.4, since then he has been afebrile  Blood cultures no growth Respiratory panel is negative  Chest radiograph with bilateral interstitial infiltrates with cephalization of the vasculature and hilar congestion. Infiltrates more dense at the left upper lobe.  Follow up wbc is up to 15.   Considering patients acute illness and high risk for decompensation will start with IV antibiotic therapy with cephalosporin and oral macrolide.  Suspected left upper lobe pneumonia (present on admission) Follow up with non contrast CT chest.

## 2022-06-28 LAB — BASIC METABOLIC PANEL
Anion gap: 8 (ref 5–15)
BUN: 10 mg/dL (ref 6–20)
CO2: 26 mmol/L (ref 22–32)
Calcium: 8.6 mg/dL — ABNORMAL LOW (ref 8.9–10.3)
Chloride: 94 mmol/L — ABNORMAL LOW (ref 98–111)
Creatinine, Ser: 1 mg/dL (ref 0.61–1.24)
GFR, Estimated: >60 mL/min (ref >60)
Glucose, Bld: 337 mg/dL — ABNORMAL HIGH (ref 70–99)
Potassium: 4.2 mmol/L (ref 3.5–5.1)
Sodium: 128 mmol/L — ABNORMAL LOW (ref 135–145)

## 2022-06-28 LAB — CBC
HCT: 23.3 % — ABNORMAL LOW (ref 39.0–52.0)
Hemoglobin: 7.1 g/dL — ABNORMAL LOW (ref 13.0–17.0)
MCH: 22.8 pg — ABNORMAL LOW (ref 26.0–34.0)
MCHC: 30.5 g/dL (ref 30.0–36.0)
MCV: 74.9 fL — ABNORMAL LOW (ref 80.0–100.0)
Platelets: 330 10*3/uL (ref 150–400)
RBC: 3.11 MIL/uL — ABNORMAL LOW (ref 4.22–5.81)
RDW: 20.1 % — ABNORMAL HIGH (ref 11.5–15.5)
WBC: 16.2 10*3/uL — ABNORMAL HIGH (ref 4.0–10.5)
nRBC: 0 % (ref 0.0–0.2)

## 2022-06-28 LAB — ABO/RH: ABO/RH(D): A POS

## 2022-06-28 LAB — HEPARIN LEVEL (UNFRACTIONATED): Heparin Unfractionated: 0.37 IU/mL (ref 0.30–0.70)

## 2022-06-28 LAB — PREPARE RBC (CROSSMATCH)

## 2022-06-28 LAB — COOXEMETRY PANEL
Carboxyhemoglobin: 3.2 % — ABNORMAL HIGH (ref 0.5–1.5)
Methemoglobin: <0.7 % (ref 0.0–1.5)
O2 Saturation: 65.4 %
Total hemoglobin: 7.4 g/dL — ABNORMAL LOW (ref 12.0–16.0)

## 2022-06-28 LAB — OCCULT BLOOD X 1 CARD TO LAB, STOOL: Fecal Occult Bld: NEGATIVE

## 2022-06-28 LAB — HEMOGLOBIN AND HEMATOCRIT, BLOOD
HCT: 26.1 % — ABNORMAL LOW (ref 39.0–52.0)
Hemoglobin: 8 g/dL — ABNORMAL LOW (ref 13.0–17.0)

## 2022-06-28 LAB — GLUCOSE, CAPILLARY
Glucose-Capillary: 312 mg/dL — ABNORMAL HIGH (ref 70–99)
Glucose-Capillary: 399 mg/dL — ABNORMAL HIGH (ref 70–99)
Glucose-Capillary: 311 mg/dL — ABNORMAL HIGH (ref 70–99)
Glucose-Capillary: 265 mg/dL — ABNORMAL HIGH (ref 70–99)

## 2022-06-28 MED ORDER — INSULIN ASPART 100 UNIT/ML IJ SOLN
5.0000 [IU] | Freq: Three times a day (TID) | INTRAMUSCULAR | Status: DC
Start: 2022-06-28 — End: 2022-07-03
  Administered 2022-06-28 – 2022-07-03 (×11): 5 [IU] via SUBCUTANEOUS

## 2022-06-28 MED ORDER — INSULIN GLARGINE-YFGN 100 UNIT/ML ~~LOC~~ SOLN
20.0000 [IU] | Freq: Two times a day (BID) | SUBCUTANEOUS | Status: DC
  Administered 2022-06-28 – 2022-06-30 (×5): 20 [IU] via SUBCUTANEOUS
  Filled 2022-06-28 (×6): qty 0.2

## 2022-06-28 MED ORDER — SODIUM CHLORIDE 0.9% IV SOLUTION: Freq: Once | INTRAVENOUS | Status: AC

## 2022-06-28 NOTE — Progress Notes (Signed)
ANTICOAGULATION CONSULT NOTE   Pharmacy Consult for Heparin Indication: chest pain/ACS  No Known Allergies  Patient Measurements: Height: 6' (182.9 cm) Weight: 69.6 kg (153 lb 7 oz) IBW/kg (Calculated) : 77.6  Vital Signs: Temp: 99.5 F (37.5 C) (09/09 1135) Temp Source: Oral (09/09 1135) BP: 99/59 (09/09 1135) Pulse Rate: 115 (09/09 1135)  Labs: Recent Labs    06/26/22 0752 06/26/22 1410 06/26/22 2247 06/27/22 0430 06/28/22 0410  HGB 7.5*  --   --  7.4* 7.1*  HCT 25.4*  --   --  24.8* 23.3*  PLT 332  --   --  311 330  HEPARINUNFRC  --    < > 0.35 0.30 0.37  CREATININE 0.92  --   --  0.81 1.00   < > = values in this interval not displayed.     Estimated Creatinine Clearance: 82.2 mL/min (by C-G formula based on SCr of 1 mg/dL).   Medical History: Past Medical History:  Diagnosis Date   Acid reflux    Concussion    Coronary artery disease    Diabetes (HCC)    ETOH abuse    High cholesterol    Hypertension    MI (myocardial infarction) ([place])    2012   Sleep apnea    USES CPAP AS NEEDED   Tobacco abuse     Medications:  Scheduled:   sodium chloride   Intravenous Once   aspirin EC  81 mg Oral Daily   azithromycin  500 mg Oral Daily   Chlorhexidine Gluconate Cloth  6 each Topical Daily   digoxin  0.125 mg Oral Daily   feeding supplement  237 mL Oral BID BM   insulin aspart  0-15 Units Subcutaneous TID WC   insulin aspart  0-5 Units Subcutaneous QHS   insulin aspart  5 Units Subcutaneous TID WC   insulin glargine-yfgn  20 Units Subcutaneous BID   pantoprazole  40 mg Oral Daily   rosuvastatin  40 mg Oral Daily   sacubitril-valsartan  1 tablet Oral BID   sodium chloride flush  3 mL Intravenous Q12H   spironolactone  12.5 mg Oral Daily    Assessment: 56 y.o. male with multivessel CAD s/p cath for heparin--to start 2 hours after TR band removal.  Heparin level continues to be at goal (0.37) on 1400 units/hr. . Of note hemoglobin is down to 7.1, plt  wnl. No bleeding issues noted overnight.   Goal of Therapy:  Heparin level 0.3-0.7 units/ml Monitor platelets by anticoagulation protocol: Yes   Plan:  Continue IV heparin at 1400 units/hr Daily heparin level and CBC.  [place] Crane, Roylene Reason, BCCP Clinical Pharmacist  06/28/2022 2:22 PM   [facility] pharmacy phone numbers are listed on [email or web address]

## 2022-06-28 NOTE — Progress Notes (Signed)
Patient has read and understands blood consent form, however, is choosing not to sign consent until Mateo Flow (significant other) reads later this afternoon, which delays start of blood admin this morning

## 2022-06-28 NOTE — Progress Notes (Signed)
Mobility Specialist Progress Note    06/28/22 1104  Mobility  Activity Ambulated independently in hallway  Level of Assistance Independent  Assistive Device None  Distance Ambulated (ft) 800 ft  Activity Response Tolerated well  $Mobility charge 1 Mobility   Pre-Mobility: 119 HR During Mobility: 137 HR, 88-95% on 6LO2 SpO2 Post-Mobility: 132 HR  Pt received in chair and agreeable. No complaints on walk. Returned to chair with call bell in reach.    Hildred Alamin Mobility Specialist

## 2022-06-28 NOTE — Progress Notes (Signed)
Progress Note   Patient: [name] IHK:[id number] DOB: [birth year] DOA: 06/24/2022     4 DOS: the patient was seen and examined on 06/28/2022   Brief hospital course: Mr. Pollack was admitted to the hospital with the working diagnosis of decompensated heart failure.  56 yo male with the past medical history of coronary artery disease, hypertension, and dyslipidemia who presented with chest pain. Intermittent chest pain, left sided pressure and radiated to the right. Because persistent symptoms he came to the ED. On his initial physical evaluation his blood pressure was 111/80, HR 89, RR 26 and 02 saturation 93%, lungs with rales but not wheezing, heart with S1 and S2 present and rhythmic, abdomen with no distention and positive lower extremity edema   Na 137, K 3,4 Cl 99, bicarbonate 23, glucose 295 bun 17 cr 0,87  BNP 639 High sensitive troponin 139  Wbc 10,9 hgb 8,2 plt 364   Chest radiograph with bilateral hilar vascular congestion, bilateral interstitial infiltrates more at the lower lobes, cephalization of the vasculature and small bilateral pleural effusions.   EKG 116 bpm, normal axis and normal intervals, sinus rhythm with V5 and V6 ST depression with no significant T wave changes, poor R R wave progression.   Echocardiogram with reduced LV systolic function, positive Mitral regurgitation and severe multivessel CAD and IDA   Patient has been placed on inotropic support and diuresis with furosemide  09/07 positive fever.  09/08 patient started on antibiotic therapy for pneumonia, (present on admission).     Assessment and Plan: Acute systolic heart failure (HCC) Echocardiogram with left ventricle EF 40 to 45%, mild dilated internal cavity, moderate hypokinesis of the left ventricular, basal mid inferolateral wall and anterolateral wall. RV systolic function is preserved. LA with severe dilatation. Severe mitral regurgitation   TEE with moderate to severe mitral  regurgitation   09/06 cardiac catheterization with advance multivessel coronary artery disease.  Severe mitral regurgitation.  PCWP 26 Cardiac output 5,4 per Fick.  Urine output is 3650 cc over last 24 hrs Blood pressure systolic 756 to 433 mmHg.  SV02 65  Continue inotropic support with milrinone at 0.25 Diuresis with furosemide 80 mg IV q12 hrs and spironolactone  Transitioned to entresto.  Continue with digoxin  Plan for further work up with cardiac MRI.    Acute hypoxemic respiratory failure.  Acute pulmonary edema.  Continue with increased 02 requirements, 02 saturation is 94% on 7 L/min per [place].   CAD (coronary artery disease) Multivessel coronary artery disease NSTEMI   No B blockade due to low output heart failure decompensation.   Plan to discontinue heparin today after 72 hrs of treatment.   Continue with statin therapy and aspirin.   Hypertension Continue blood pressure support with milrinone  Patient transitioned to entresto.   Type 2 diabetes mellitus with hyperlipidemia (HCC) Hyperglycemia.   Insulin has been increased to 30 units basal and 3 units pre meal along with insulin sliding scale.  Fasting glucose continue to be elevated at 337  Capillary 312, 336 and 322.   Will increase basal insulin to 20 units bid, for at total of 40 units of basal insulin per day.  Increase pre meal to 5 units.   Continue with statin therapy  Hypokalemia Hyponatremia. Hypomagnesemia  Renal function with serum cr at 1,0 with K at 4,2 and serum bicarbonate at 26. Na is 128   Continue diuresis with furosemide and spironolactone Follow up renal function and electrolytes in am.  GERD (gastroesophageal reflux disease) Continue with pantoprazole  Microcytic anemia Cell count stable   Tobacco abuse Smoking cessation   Iron deficiency anemia Follow up Hgb is 7,5 with plt 332  Iron stores with serum iron 21. TIBC 549, ferritin 26 and transferrin saturation  4  Plan to add IV iron after completion of heart MRI.  No current indication for PRBC transfusion.   Left upper lobe pneumonia Patient with reactive leukocytosis He had a fever last night 100.4, since then he has been afebrile  Blood cultures no growth Respiratory panel is negative  Chest radiograph with bilateral interstitial infiltrates with cephalization of the vasculature and hilar congestion. Infiltrates more dense at the left upper lobe.   Follow up chest CT with bilateral dense ground glass opacities bilaterally, centrally with small right pleural effusion. Wbc is elevated at 16.2  T max has been around 99 Plan to continue antibiotic therapy for now due to possible infection.  Follow up on cultures and cell count.  Out of bed to chair tid with meals Pt and Ot.  Incentive spirometer and flutter valve.         Subjective: Patient with improvement in dyspnea, but not back to baseline no chest pain.   Physical Exam: Vitals:   06/27/22 2322 06/28/22 0349 06/28/22 0628 06/28/22 0716  BP: 114/78 118/77  115/72  Pulse:  (!) 120  (!) 117  Resp: 20   (!) 29  Temp: 99 F (37.2 C) 99.6 F (37.6 C)  99.1 F (37.3 C)  TempSrc: Oral Oral  Oral  SpO2: 92% 94%  94%  Weight:   69.6 kg   Height:       Neurology awake and alert ENT with mild pallor Cardiovascular with S1 and S2 present with positive murmur at the left lower sternal border No gallops  Positive lower extremity edema + more on the right Abdomen with no distention   Data Reviewed:    Family Communication: no family at the bedside   Disposition: Status is: Inpatient Remains inpatient appropriate because: heart failure and respiratory failure   Planned Discharge Destination: Home      Author: Tawni Millers, MD 06/28/2022 8:59 AM  For on call review [email or web address].

## 2022-06-28 NOTE — Progress Notes (Signed)
Patient ID: [name], male   DOB: 08/13/1966, 56 y.o.   MRN: [id number]     Advanced Heart Failure Rounding Note  PCP-Cardiologist: None   Subjective:    09/06: Started milrinone 0.125 to facilitate diuresis in setting of low-output 09/07: Milrinone increased to 0.25.   Good diuresis yesterday, weight down 3 lbs and CVP 1-2.  He was tachycardic when he got up to chair (HR 120s).  He remains on milrinone 0.25 with co-ox 65%.  BP stable.   Hgb trending down, 7.1 this morning.  FOBT negative but he is Fe deficient.    Objective:   Weight Range: 69.6 kg Body mass index is 20.81 kg/m.   Vital Signs:   Temp:  [98.8 F (37.1 C)-99.7 F (37.6 C)] 99.1 F (37.3 C) (09/09 0716) Pulse Rate:  [110-123] 117 (09/09 0716) Resp:  [18-30] 29 (09/09 0716) BP: (110-121)/(72-81) 115/72 (09/09 0716) SpO2:  [89 %-95 %] 94 % (09/09 0716) Weight:  [69.6 kg] 69.6 kg (09/09 0628) Last BM Date : 06/25/22  Weight change: Filed Weights   06/26/22 0358 06/27/22 0432 06/28/22 0628  Weight: 70.9 kg 70.8 kg 69.6 kg    Intake/Output:   Intake/Output Summary (Last 24 hours) at 06/28/2022 0912 Last data filed at 06/28/2022 0800 Gross per 24 hour  Intake 1552.09 ml  Output 3650 ml  Net -2097.91 ml     CVP 1-2 personally checked,  Physical Exam   General: NAD Neck: No JVD, no thyromegaly or thyroid nodule.  Lungs: Clear to auscultation bilaterally with normal respiratory effort. CV: Nondisplaced PMI.  Heart regular S1/S2, no S3/S4, 2/6 HSM apex.  No peripheral edema.   Abdomen: Soft, nontender, no hepatosplenomegaly, no distention.  Skin: Intact without lesions or rashes.  Neurologic: Alert and oriented x 3.  Psych: Normal affect. Extremities: No clubbing or cyanosis.  HEENT: Normal.    Telemetry   Sinus Tach 100-120s    Labs    CBC Recent Labs    06/27/22 0430 06/28/22 0410  WBC 15.5* 16.2*  HGB 7.4* 7.1*  HCT 24.8* 23.3*  MCV 75.8* 74.9*  PLT 311 371   Basic  Metabolic Panel Recent Labs    06/26/22 0752 06/27/22 0430 06/28/22 0410  NA 134* 132* 128*  K 3.3* 3.8 4.2  CL 93* 97* 94*  CO2 '28 26 26  '$ GLUCOSE 336* 269* 337*  BUN '13 10 10  '$ CREATININE 0.92 0.81 1.00  CALCIUM 9.2 8.6* 8.6*  MG 1.3* 1.8  --    Liver Function Tests No results for input(s): "AST", "ALT", "ALKPHOS", "BILITOT", "PROT", "ALBUMIN" in the last 72 hours. No results for input(s): "LIPASE", "AMYLASE" in the last 72 hours. Cardiac Enzymes No results for input(s): "CKTOTAL", "CKMB", "CKMBINDEX", "TROPONINI" in the last 72 hours.  BNP: BNP (last 3 results) Recent Labs    06/24/22 0809 06/25/22 0133  BNP 639.0* 830.5*    ProBNP (last 3 results) No results for input(s): "PROBNP" in the last 8760 hours.   D-Dimer No results for input(s): "DDIMER" in the last 72 hours. Hemoglobin A1C No results for input(s): "HGBA1C" in the last 72 hours.  Fasting Lipid Panel No results for input(s): "CHOL", "HDL", "LDLCALC", "TRIG", "CHOLHDL", "LDLDIRECT" in the last 72 hours.  Thyroid Function Tests No results for input(s): "TSH", "T4TOTAL", "T3FREE", "THYROIDAB" in the last 72 hours.  Invalid input(s): "FREET3"  Other results:   Imaging    CT CHEST WO CONTRAST  Result Date: 06/27/2022 CLINICAL DATA:  Pneumonia EXAM: CT  CHEST WITHOUT CONTRAST TECHNIQUE: Multidetector CT imaging of the chest was performed following the standard protocol without IV contrast. RADIATION DOSE REDUCTION: This exam was performed according to the departmental dose-optimization program which includes automated exposure control, adjustment of the mA and/or kV according to patient size and/or use of iterative reconstruction technique. COMPARISON:  Chest radiograph dated 06/26/2022 FINDINGS: Cardiovascular: Heart is top-normal in size. No pericardial effusion. No evidence of thoracic aortic aneurysm. Very mild atherosclerotic calcifications of the aortic arch. Three vessel coronary atherosclerosis.  Right IJ venous catheter terminating at the cavoatrial junction. Mediastinum/Nodes: Small mediastinal lymph nodes which do not meet pathologic CT size criteria. Visualized thyroid is unremarkable. Lungs/Pleura: Multifocal patchy/ground-glass opacities in the lungs bilaterally, with a central/perihilar distribution, favoring interstitial edema. Small bilateral pleural effusions. No suspicious pulmonary nodules. No pneumothorax. Upper Abdomen: Visualized upper abdomen is grossly unremarkable. Musculoskeletal: Mild degenerative changes of the lower thoracic spine. IMPRESSION: Suspected interstitial edema with small bilateral pleural effusions. Aortic Atherosclerosis (ICD10-I70.0). Electronically Signed   By: Julian Hy M.D.   On: 06/27/2022 20:50     Medications:     Scheduled Medications:  aspirin EC  81 mg Oral Daily   azithromycin  500 mg Oral Daily   Chlorhexidine Gluconate Cloth  6 each Topical Daily   digoxin  0.125 mg Oral Daily   feeding supplement  237 mL Oral BID BM   insulin aspart  0-15 Units Subcutaneous TID WC   insulin aspart  0-5 Units Subcutaneous QHS   insulin aspart  5 Units Subcutaneous TID WC   insulin glargine-yfgn  20 Units Subcutaneous BID   pantoprazole  40 mg Oral Daily   rosuvastatin  40 mg Oral Daily   sacubitril-valsartan  1 tablet Oral BID   sodium chloride flush  3 mL Intravenous Q12H   spironolactone  12.5 mg Oral Daily    Infusions:  sodium chloride     cefTRIAXone (ROCEPHIN)  IV 2 g (06/27/22 1641)   heparin 1,400 Units/hr (06/27/22 1755)   milrinone 0.25 mcg/kg/min (06/28/22 0452)    PRN Medications: sodium chloride, acetaminophen, guaiFENesin-dextromethorphan, ondansetron (ZOFRAN) IV, oxyCODONE, sodium chloride flush    Patient Profile   56 y/o AAM w/ h/o multivessel CAD s/p remote MI in 2012 and multiple PCIs to LAD, diag, ramus and OM vessels, HTN, HLD, Type 2 DM and tobacco use, admitted for NSTEMI and acute CHF. 2D Echo and TEE w/  severe mitral regurgitation. LVEF 40-45%, RV ok. Cath w/ multivessel CAD including high grade LM disease, elevated filling pressures and preserved CO.     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   2.  Acute on chronic systolic CHF/Ischemic cardiomyopathy - Echo with EF 40-45%.  - RHC w/ RA 8, PA 48/23 (38), PCWP mean 26, LVEDP 21,  PA sat 49% - Today, co-ox 65% with CVP 1-2 after good diuresis.  Remains tachycardic when he gets out of bed.  - Decrease milrinone to 0.125 mg daily today and continue digoxin.  - Can stop IV Lasix.   - Continue Entresto 24/26 bid.   - Continue spiro 12.5 mg daily - No beta blocker for now with low-output   2. NSTEMI w/ Multivessel CAD  - Known hx prior CAD with MI in 2012 and prior multivessel PCI - HS trop peak 1,477 - LHC  w/ severe MVCAD including high grade LM disease  - ASA + high intensity statin, heparin gtt - Think best path forwards would be CABG-MVR, can consider MRI viability study but with EF not markedly low not sure adds much.      3. Type 2DM  - per primary team  - A1c 8.2%  - Eventual SGLT2i - Will need good control of blood sugars if goes for CABG   4. IDA - Hgb 8.4---> 7.4 --> 7.1 today . Obtain type and screen.  - no obvious source blood loss, FOBT negative.  - per primary team - Iron  21, sats 4%. No feraheme yet, give if opt not to do MRI.  - Will give 1 unit PRBCs today.  With low CVP, may help with sinus tachy.   5. ETOH and tobacco abuse - Watch for ETOH withdrawal  6. ID - CT chest with pulmonary edema yesterday.  - Cough improved with diuresis.  - He is now on azithromycin. - Suspect CHF was cause of cough etc.   Length of Stay: 4  Loralie Champagne, MD  06/28/2022, 9:12 AM  Advanced Heart Failure Team Pager [phone] (M-F; 7a - 5p)   Please contact [facility] for night-coverage after hours (5p -7a ) and weekends on [email or web address]

## 2022-06-29 ENCOUNTER — Inpatient Hospital Stay (HOSPITAL_COMMUNITY): Payer: No Typology Code available for payment source

## 2022-06-29 LAB — GLUCOSE, CAPILLARY
Glucose-Capillary: 324 mg/dL — ABNORMAL HIGH (ref 70–99)
Glucose-Capillary: 192 mg/dL — ABNORMAL HIGH (ref 70–99)
Glucose-Capillary: 286 mg/dL — ABNORMAL HIGH (ref 70–99)
Glucose-Capillary: 259 mg/dL — ABNORMAL HIGH (ref 70–99)
Glucose-Capillary: 286 mg/dL — ABNORMAL HIGH (ref 70–99)

## 2022-06-29 LAB — CBC
HCT: 26 % — ABNORMAL LOW (ref 39.0–52.0)
Hemoglobin: 7.9 g/dL — ABNORMAL LOW (ref 13.0–17.0)
MCH: 23.2 pg — ABNORMAL LOW (ref 26.0–34.0)
MCHC: 30.4 g/dL (ref 30.0–36.0)
MCV: 76.2 fL — ABNORMAL LOW (ref 80.0–100.0)
Platelets: 371 10*3/uL (ref 150–400)
RBC: 3.41 MIL/uL — ABNORMAL LOW (ref 4.22–5.81)
RDW: 19.9 % — ABNORMAL HIGH (ref 11.5–15.5)
WBC: 14.9 10*3/uL — ABNORMAL HIGH (ref 4.0–10.5)
nRBC: 0 % (ref 0.0–0.2)

## 2022-06-29 LAB — COOXEMETRY PANEL
Carboxyhemoglobin: 1.9 % — ABNORMAL HIGH (ref 0.5–1.5)
Methemoglobin: <0.7 % (ref 0.0–1.5)
O2 Saturation: 65.2 %
Total hemoglobin: 8.4 g/dL — ABNORMAL LOW (ref 12.0–16.0)

## 2022-06-29 LAB — BASIC METABOLIC PANEL
Anion gap: 10 (ref 5–15)
BUN: 12 mg/dL (ref 6–20)
CO2: 27 mmol/L (ref 22–32)
Calcium: 9.1 mg/dL (ref 8.9–10.3)
Chloride: 94 mmol/L — ABNORMAL LOW (ref 98–111)
Creatinine, Ser: 0.85 mg/dL (ref 0.61–1.24)
GFR, Estimated: >60 mL/min (ref >60)
Glucose, Bld: 344 mg/dL — ABNORMAL HIGH (ref 70–99)
Potassium: 4.1 mmol/L (ref 3.5–5.1)
Sodium: 131 mmol/L — ABNORMAL LOW (ref 135–145)

## 2022-06-29 LAB — URINE CULTURE: Culture: NO GROWTH

## 2022-06-29 LAB — SARS CORONAVIRUS 2 BY RT PCR: SARS Coronavirus 2 by RT PCR: NEGATIVE

## 2022-06-29 LAB — HEPARIN LEVEL (UNFRACTIONATED): Heparin Unfractionated: 0.21 IU/mL — ABNORMAL LOW (ref 0.30–0.70)

## 2022-06-29 MED ORDER — SPIRONOLACTONE 25 MG PO TABS
25.0000 mg | ORAL_TABLET | Freq: Every day | ORAL | Status: DC
  Administered 2022-06-30 – 2022-07-03 (×4): 25 mg via ORAL
  Filled 2022-06-29 (×4): qty 1

## 2022-06-29 MED ORDER — HEPARIN SODIUM (PORCINE) 5000 UNIT/ML IJ SOLN
5000.0000 [IU] | Freq: Three times a day (TID) | INTRAMUSCULAR | Status: DC
  Administered 2022-06-29 – 2022-07-03 (×14): 5000 [IU] via SUBCUTANEOUS
  Filled 2022-06-29 (×15): qty 1

## 2022-06-29 NOTE — Evaluation (Signed)
Occupational Therapy Evaluation and Discharge Patient Details Name: [name] MRN: [id number] DOB: [birth year] Today's Date: 06/29/2022   History of Present Illness [name] is a 56 y.o. male Presenting with chest pain. He reports his symptoms initially started 3 nights ago. Found to have non-ST elevation myocardial infarction, prior PCI's 2012 in the setting of MI, newly discovered severe mitral regurgitation with EF 40% with anemia iron deficiency newly discovered as well. May have CABG. PHMx:CAD, GERD, HTN, HLD, tobacco abuse.   Clinical Impression   This 56 yo male admitted with above with possible CABG this coming week presents to acute OT with PLOF of totally independent with basic ADLs, IADLs, driving, and working. He is currently at an independent level in his room (does need A with lines and leads). No further OT needs, no PT needs identified. Please re-order therapy if pt has CABG. OT will sign off for now.     Recommendations for follow up therapy are one component of a multi-disciplinary discharge planning process, led by the attending physician.  Recommendations may be updated based on patient status, additional functional criteria and insurance authorization.   Follow Up Recommendations  No OT follow up    Assistance Recommended at Discharge None     Functional Status Assessment  Patient has not had a recent decline in their functional status  Equipment Recommendations  None recommended by OT       Precautions / Restrictions Precautions Precautions: None Restrictions Weight Bearing Restrictions: No      Mobility Bed Mobility               General bed mobility comments: up in recliner    Transfers Overall transfer level: Independent                 General transfer comment: ambulation in room independently (no AD)      Balance Overall balance assessment: Independent                                          ADL either performed or assessed with clinical judgement   ADL Overall ADL's : Independent                                       General ADL Comments: Educated on sternal precautions with possible impending CABG this coming week     Vision Baseline Vision/History: 1 Wears glasses Ability to See in Adequate Light: 0 Adequate Patient Visual Report: No change from baseline              Pertinent Vitals/Pain Pain Assessment Pain Assessment: No/denies pain     Hand Dominance Right   Extremity/Trunk Assessment Upper Extremity Assessment Upper Extremity Assessment: Overall WFL for tasks assessed           Communication Communication Communication: No difficulties   Cognition Arousal/Alertness: Awake/alert Behavior During Therapy: WFL for tasks assessed/performed Overall Cognitive Status: Within Functional Limits for tasks assessed                                       General Comments   VSS on RA            Home Living  Family/patient expects to be discharged to:: Private residence Living Arrangements: Non-relatives/Friends (roommate) Available Help at Discharge: Friend(s) Type of Home: Apartment Home Access: Stairs to enter Technical brewer of Steps: flight Entrance Stairs-Rails: Left;Right;Can reach both Home Layout: One level     Bathroom Shower/Tub: Corporate investment banker: [place]: None   Additional Comments: works as a Sports coach, on Insurance account manager from [employer]      Prior Functioning/Environment Prior Level of Function : Independent/Modified Independent;Working/employed;Driving                        OT Problem List:  (none--baseline)         OT Goals(Current goals can be found in the care plan section) Acute Rehab OT Goals Patient Stated Goal: to figure out what they are going to do tomorrow. Get back to work when able         AM-PAC OT "6 Clicks" Daily  Activity     Outcome Measure Help from another person eating meals?: None Help from another person taking care of personal grooming?: None Help from another person toileting, which includes using toliet, bedpan, or urinal?: None Help from another person bathing (including washing, rinsing, drying)?: None Help from another person to put on and taking off regular upper body clothing?: None Help from another person to put on and taking off regular lower body clothing?: None 6 Click Score: 24   End of Session Equipment Utilized During Treatment:  (none) Nurse Communication: Mobility status  Activity Tolerance: Patient tolerated treatment well Patient left: in chair;with call bell/phone within reach  OT Visit Diagnosis: Muscle weakness (generalized) (M62.81)                Time: 4888-9169 OT Time Calculation (min): 10 min Charges:  OT General Charges $OT Visit: 1 Visit OT Evaluation $OT Eval Low Complexity: 1 Low  Golden Circle, OTR/L [facility] [phone] Office [phone]    Almon Register 06/29/2022, 2:36 PM

## 2022-06-29 NOTE — Progress Notes (Signed)
Patient ID: [name], male   DOB: April 21, 1966, 56 y.o.   MRN: [id number]   Advanced Heart Failure Rounding Note  PCP-Cardiologist: None   Subjective:    09/06: Started milrinone 0.125 to facilitate diuresis in setting of low-output 09/07: Milrinone increased to 0.25. 09/09: Milrinone decreased to 0.125   Today, CVP 1-2 with co-ox 65% on milrinone 0.125.  HR lower in 110s today, BP stable.   1 unit PRBCs yesterday with hgb up to 7.9 today.  FOBT negative but he is Fe deficient.   No complaints, feels "great."    Objective:   Weight Range: 70.4 kg Body mass index is 21.05 kg/m.   Vital Signs:   Temp:  [98.5 F (36.9 C)-99.5 F (37.5 C)] 98.6 F (37 C) (09/10 0745) Pulse Rate:  [83-118] 117 (09/10 0745) Resp:  [12-29] 20 (09/10 0745) BP: (99-123)/(59-83) 111/80 (09/10 0745) SpO2:  [92 %-98 %] 98 % (09/10 0745) Weight:  [70.4 kg] 70.4 kg (09/10 0502) Last BM Date : 06/25/22  Weight change: Filed Weights   06/27/22 0432 06/28/22 0628 06/29/22 0502  Weight: 70.8 kg 69.6 kg 70.4 kg    Intake/Output:   Intake/Output Summary (Last 24 hours) at 06/29/2022 0957 Last data filed at 06/29/2022 0800 Gross per 24 hour  Intake 814 ml  Output 700 ml  Net 114 ml     CVP 1-2 personally checked,  Physical Exam   General: NAD Neck: No JVD, no thyromegaly or thyroid nodule.  Lungs: Clear to auscultation bilaterally with normal respiratory effort. CV: Nondisplaced PMI.  Heart regular S1/S2, no S3/S4, 2/6 HSM apex.  No peripheral edema.   Abdomen: Soft, nontender, no hepatosplenomegaly, no distention.  Skin: Intact without lesions or rashes.  Neurologic: Alert and oriented x 3.  Psych: Normal affect. Extremities: No clubbing or cyanosis.  HEENT: Normal.   Telemetry   Sinus Tachy around 110 (personally reviewed).    Labs    CBC Recent Labs    06/28/22 0410 06/28/22 2232 06/29/22 0430  WBC 16.2*  --  14.9*  HGB 7.1* 8.0* 7.9*  HCT 23.3* 26.1* 26.0*  MCV  74.9*  --  76.2*  PLT 330  --  563   Basic Metabolic Panel Recent Labs    06/27/22 0430 06/28/22 0410 06/29/22 0430  NA 132* 128* 131*  K 3.8 4.2 4.1  CL 97* 94* 94*  CO2 '26 26 27  '$ GLUCOSE 269* 337* 344*  BUN '10 10 12  '$ CREATININE 0.81 1.00 0.85  CALCIUM 8.6* 8.6* 9.1  MG 1.8  --   --    Liver Function Tests No results for input(s): "AST", "ALT", "ALKPHOS", "BILITOT", "PROT", "ALBUMIN" in the last 72 hours. No results for input(s): "LIPASE", "AMYLASE" in the last 72 hours. Cardiac Enzymes No results for input(s): "CKTOTAL", "CKMB", "CKMBINDEX", "TROPONINI" in the last 72 hours.  BNP: BNP (last 3 results) Recent Labs    06/24/22 0809 06/25/22 0133  BNP 639.0* 830.5*    ProBNP (last 3 results) No results for input(s): "PROBNP" in the last 8760 hours.   D-Dimer No results for input(s): "DDIMER" in the last 72 hours. Hemoglobin A1C No results for input(s): "HGBA1C" in the last 72 hours.  Fasting Lipid Panel No results for input(s): "CHOL", "HDL", "LDLCALC", "TRIG", "CHOLHDL", "LDLDIRECT" in the last 72 hours.  Thyroid Function Tests No results for input(s): "TSH", "T4TOTAL", "T3FREE", "THYROIDAB" in the last 72 hours.  Invalid input(s): "FREET3"  Other results:   Imaging    No  results found.   Medications:     Scheduled Medications:  aspirin EC  81 mg Oral Daily   azithromycin  500 mg Oral Daily   Chlorhexidine Gluconate Cloth  6 each Topical Daily   digoxin  0.125 mg Oral Daily   feeding supplement  237 mL Oral BID BM   heparin injection (subcutaneous)  5,000 Units Subcutaneous Q8H   insulin aspart  0-15 Units Subcutaneous TID WC   insulin aspart  0-5 Units Subcutaneous QHS   insulin aspart  5 Units Subcutaneous TID WC   insulin glargine-yfgn  20 Units Subcutaneous BID   pantoprazole  40 mg Oral Daily   rosuvastatin  40 mg Oral Daily   sacubitril-valsartan  1 tablet Oral BID   sodium chloride flush  3 mL Intravenous Q12H   [START ON 06/30/2022]  spironolactone  25 mg Oral Daily    Infusions:  sodium chloride     cefTRIAXone (ROCEPHIN)  IV 2 g (06/28/22 1551)   milrinone 0.125 mcg/kg/min (06/28/22 0917)    PRN Medications: sodium chloride, acetaminophen, guaiFENesin-dextromethorphan, ondansetron (ZOFRAN) IV, oxyCODONE, sodium chloride flush    Patient Profile   56 y/o AAM w/ h/o multivessel CAD s/p remote MI in 2012 and multiple PCIs to LAD, diag, ramus and OM vessels, HTN, HLD, Type 2 DM and tobacco use, admitted for NSTEMI and acute CHF. 2D Echo and TEE w/ severe mitral regurgitation. LVEF 40-45%, RV ok. Cath w/ multivessel CAD including high grade LM disease, elevated filling pressures and preserved CO.     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   2.  Acute on chronic systolic CHF/Ischemic cardiomyopathy - Echo with EF 40-45%.  - RHC w/ RA 8, PA 48/23 (38), PCWP mean 26, LVEDP 21,  PA sat 49% - Today, co-ox 65% with CVP 1-2.  Remains mildly tachy rate around 110.   - Continue digoxin 0.125.  - Keep milrinone at 0.125 to maintain hemodynamic stability while making decisions on forward path.  - No diuretic today.   - Continue Entresto 24/26 bid.   - Increase spironolactone to 25 mg daily.  - No beta blocker for now with low-output   2. NSTEMI w/ Multivessel CAD  - Known hx prior CAD with MI in 2012 and prior multivessel PCI - HS trop peak 1,477 - LHC w/ severe MVCAD including high grade LM disease  - ASA + high intensity statin, heparin gtt - Think best path forwards would be CABG-MVR, can consider MRI viability study but with EF not markedly low not sure adds much. Will need to be seen by [facility] Monday.      3. Type 2DM  - per primary team  - A1c 8.2%  - Eventual SGLT2i - Will need good control of blood sugars if goes for CABG    4. IDA - Hgb 8.4---> 7.4 --> 7.1 -> 1 unit PRBCs -> 7.9 today.  - no obvious source blood loss, FOBT negative.  - Iron  21, sats 4%. No feraheme yet, give if opt not to do MRI.   5. ETOH and tobacco abuse - Watch for ETOH withdrawal  6. ID - CT chest with pulmonary edema yesterday.  - Cough improved with diuresis.  - He is now on azithromycin. - Suspect CHF was cause of cough etc.  Length of Stay: Harvey, MD  06/29/2022, 9:57 AM  Advanced Heart Failure Team Pager [phone] (M-F; 7a - 5p)  Please contact [facility] for night-coverage after hours (5p -7a ) and weekends on [email or web address]

## 2022-06-29 NOTE — Plan of Care (Signed)

## 2022-06-29 NOTE — Progress Notes (Signed)
[place] for Heparin Indication: chest pain/ACS  No Known Allergies  Patient Measurements: Height: 6' (182.9 cm) Weight: 70.4 kg (155 lb 3.3 oz) IBW/kg (Calculated) : 77.6  Vital Signs: Temp: 98.5 F (36.9 C) (09/10 0725) Temp Source: Oral (09/10 0725) BP: 109/83 (09/10 0725) Pulse Rate: 83 (09/10 0725)  Labs: Recent Labs    06/27/22 0430 06/28/22 0410 06/28/22 2232 06/29/22 0430  HGB 7.4* 7.1* 8.0* 7.9*  HCT 24.8* 23.3* 26.1* 26.0*  PLT 311 330  --  371  HEPARINUNFRC 0.30 0.37  --  0.21*  CREATININE 0.81 1.00  --  0.85     Estimated Creatinine Clearance: 97.8 mL/min (by C-G formula based on SCr of 0.85 mg/dL).   Medical History: Past Medical History:  Diagnosis Date   Acid reflux    Concussion    Coronary artery disease    Diabetes (HCC)    ETOH abuse    High cholesterol    Hypertension    MI (myocardial infarction) ([place])    2012   Sleep apnea    USES CPAP AS NEEDED   Tobacco abuse     Medications:  Scheduled:   aspirin EC  81 mg Oral Daily   azithromycin  500 mg Oral Daily   Chlorhexidine Gluconate Cloth  6 each Topical Daily   digoxin  0.125 mg Oral Daily   feeding supplement  237 mL Oral BID BM   heparin injection (subcutaneous)  5,000 Units Subcutaneous Q8H   insulin aspart  0-15 Units Subcutaneous TID WC   insulin aspart  0-5 Units Subcutaneous QHS   insulin aspart  5 Units Subcutaneous TID WC   insulin glargine-yfgn  20 Units Subcutaneous BID   pantoprazole  40 mg Oral Daily   rosuvastatin  40 mg Oral Daily   sacubitril-valsartan  1 tablet Oral BID   sodium chloride flush  3 mL Intravenous Q12H   spironolactone  12.5 mg Oral Daily    Assessment: 56 y.o. male with multivessel CAD s/p cath for heparin--to start 2 hours after TR band removal.  Heparin level slightly below goal this AM at 0.21.  No overt bleeding noted, but patient required PRBCs for Hgb down to 7.1 yesterday > up to 7.9  today.  Discussed with Dr. Aundra Dubin this AM.  Given low Hgb and ACS indication for heparin now > 48 hrs, decided to stop IV heparin.  Goal of Therapy:  Heparin level 0.3-0.7 units/ml Monitor platelets by anticoagulation protocol: Yes   Plan:  Stop IV heparin Start heparin SQ prophylaxis.  [place] Crane, Roylene Reason, BCCP Clinical Pharmacist  06/29/2022 7:51 AM   [facility] pharmacy phone numbers are listed on [email or web address]   [facility] pharmacy phone numbers are listed on [email or web address]

## 2022-06-29 NOTE — Progress Notes (Signed)
PT Cancellation Note  Patient Details Name: [name] MRN: [id number] DOB: 11-17-1965   Cancelled Treatment:    Reason Eval/Treat Not Completed: PT screened, no needs identified, will sign off. Pt mobilizing well in room and hallways per OT and mobility team. Please re-order post op if needed.    Shary Decamp [facility] 06/29/2022, 3:37 PM [place] Office [phone]

## 2022-06-29 NOTE — Progress Notes (Signed)
Progress Note   Patient: [name] ZYS:[id number] DOB: [birth year] DOA: 06/24/2022     5 DOS: the patient was seen and examined on 06/29/2022   Brief hospital course: Mr. Mendolia was admitted to the hospital with the working diagnosis of decompensated heart failure.  56 yo male with the past medical history of coronary artery disease, hypertension, and dyslipidemia who presented with chest pain. Intermittent chest pain, left sided pressure and radiated to the right. Because persistent symptoms he came to the ED. On his initial physical evaluation his blood pressure was 111/80, HR 89, RR 26 and 02 saturation 93%, lungs with rales but not wheezing, heart with S1 and S2 present and rhythmic, abdomen with no distention and positive lower extremity edema   Na 137, K 3,4 Cl 99, bicarbonate 23, glucose 295 bun 17 cr 0,87  BNP 639 High sensitive troponin 139  Wbc 10,9 hgb 8,2 plt 364   Chest radiograph with bilateral hilar vascular congestion, bilateral interstitial infiltrates more at the lower lobes, cephalization of the vasculature and small bilateral pleural effusions.   EKG 116 bpm, normal axis and normal intervals, sinus rhythm with V5 and V6 ST depression with no significant T wave changes, poor R R wave progression.   Echocardiogram with reduced LV systolic function, positive Mitral regurgitation and severe multivessel CAD and IDA   Patient has been placed on inotropic support and diuresis with furosemide  09/07 positive fever.  09/08 patient started on antibiotic therapy for pneumonia, (present on admission).  09/09 one unit PRBC transfusion with good toleration.   Assessment and Plan: Acute systolic heart failure (HCC) Echocardiogram with left ventricle EF 40 to 45%, mild dilated internal cavity, moderate hypokinesis of the left ventricular, basal mid inferolateral wall and anterolateral wall. RV systolic function is preserved. LA with severe dilatation. Severe mitral  regurgitation   TEE with moderate to severe mitral regurgitation   09/06 cardiac catheterization with advance multivessel coronary artery disease.  Severe mitral regurgitation.  PCWP 26 Cardiac output 5,4 per Fick.  Urine output is 1,500 cc over last 24 hrs Blood pressure systolic 932 mmHg.  SV02 65.2  Milrinone drip rate now down to 0,125 mcg with good toleration.  Continue medical therapy with entresto, digoxin and spironolactone. Furosemide currently on hold.    Acute hypoxemic respiratory failure.  Acute pulmonary edema.  Continue with increased 02 requirements, 02 saturation is 97% on 6 L/min per [place].   CAD (coronary artery disease) Multivessel coronary artery disease NSTEMI   No B blockade due to low output heart failure decompensation.    Continue with statin therapy and aspirin. Anticoagulation with IV heparin, possible CABG during this hospitalization.   Follow up with CT surgery recommendations.   Hypertension Continue blood pressure support with milrinone  Tolerating well entresto.   Type 2 diabetes mellitus with hyperlipidemia (HCC) Hyperglycemia.   Fasting glucose this am at 344, capillary down to 192  Continue with basal insulin 20 units bid  and 5 units pre meal along with insulin sliding scale.    Continue with statin therapy  Hypokalemia Hyponatremia. Hypomagnesemia  Volume status has improved Follow up renal function with serum cr at 0,85 with K at 4,1 and serum bicarbonate at 27.   On spironolactone  Follow up renal function in am.   GERD (gastroesophageal reflux disease) Continue with pantoprazole  Microcytic anemia Cell count stable   Tobacco abuse Smoking cessation   Iron deficiency anemia Follow up Hgb is 7,5 with plt 332  Iron stores with serum iron 21. TIBC 549, ferritin 26 and transferrin saturation 4  Patient had one unit PRBC with good toleration Follow up hgb is 7,9   Left upper lobe pneumonia 09/08 fever 100.4, Blood  cultures no growth Respiratory panel is negative  Chest radiograph with bilateral interstitial infiltrates with cephalization of the vasculature and hilar congestion. Infiltrates more dense at the left upper lobe.   Follow up chest CT with bilateral dense ground glass opacities bilaterally, centrally with small right pleural effusion. Follow up wbc is trending down to 14,9.   Continue antibiotic therapy with ceftriaxone and azithromycin Follow up chest radiograph today, continue close monitoring cell count and temperature curve.   Out of bed to chair tid with meals Pt and Ot.  Incentive spirometer and flutter valve.         Subjective: Patient with no chest pain, dyspnea and edema continue to improve, positive dry cough no hemoptysis   Physical Exam: Vitals:   06/29/22 0502 06/29/22 0725 06/29/22 0745 06/29/22 1123  BP:  109/83 111/80 111/75  Pulse:  83 (!) 117 (!) 111  Resp:  (!) 21 20 (!) 23  Temp:  98.5 F (36.9 C) 98.6 F (37 C) 98.6 F (37 C)  TempSrc:  Oral Oral Oral  SpO2:  98% 98% 97%  Weight: 70.4 kg     Height:       Neurology awake and alert ENT with mild pallor Cardiovascular with S1 and S2 present and rhythmic, positive systolic murmur at the apex No JVD Trace lower extremity edema Respiratory with mild rales at bases but no wheezing or rhonchi Abdomen with no distention  Data Reviewed:    Family Communication: no family at the bedside   Disposition: Status is: Inpatient Remains inpatient appropriate because: heart failure   Planned Discharge Destination: Home      Author: Tawni Millers, MD 06/29/2022 3:52 PM  For on call review [email or web address].

## 2022-06-30 DIAGNOSIS — R778 Other specified abnormalities of plasma proteins: Secondary | ICD-10-CM

## 2022-06-30 DIAGNOSIS — I2510 Atherosclerotic heart disease of native coronary artery without angina pectoris: Secondary | ICD-10-CM

## 2022-06-30 DIAGNOSIS — R7989 Other specified abnormal findings of blood chemistry: Secondary | ICD-10-CM

## 2022-06-30 DIAGNOSIS — I509 Heart failure, unspecified: Secondary | ICD-10-CM

## 2022-06-30 DIAGNOSIS — E538 Deficiency of other specified B group vitamins: Secondary | ICD-10-CM

## 2022-06-30 DIAGNOSIS — I350 Nonrheumatic aortic (valve) stenosis: Secondary | ICD-10-CM

## 2022-06-30 LAB — BASIC METABOLIC PANEL
Anion gap: 10 (ref 5–15)
BUN: 13 mg/dL (ref 6–20)
CO2: 27 mmol/L (ref 22–32)
Calcium: 9.2 mg/dL (ref 8.9–10.3)
Chloride: 96 mmol/L — ABNORMAL LOW (ref 98–111)
Creatinine, Ser: 0.87 mg/dL (ref 0.61–1.24)
GFR, Estimated: >60 mL/min (ref >60)
Glucose, Bld: 217 mg/dL — ABNORMAL HIGH (ref 70–99)
Potassium: 3.7 mmol/L (ref 3.5–5.1)
Sodium: 133 mmol/L — ABNORMAL LOW (ref 135–145)

## 2022-06-30 LAB — COOXEMETRY PANEL
Carboxyhemoglobin: 2.6 % — ABNORMAL HIGH (ref 0.5–1.5)
Methemoglobin: <0.7 % (ref 0.0–1.5)
O2 Saturation: 65.2 %
Total hemoglobin: 6.9 g/dL — CL (ref 12.0–16.0)

## 2022-06-30 LAB — GLUCOSE, CAPILLARY
Glucose-Capillary: 227 mg/dL — ABNORMAL HIGH (ref 70–99)
Glucose-Capillary: 232 mg/dL — ABNORMAL HIGH (ref 70–99)

## 2022-06-30 LAB — CBC
HCT: 24.6 % — ABNORMAL LOW (ref 39.0–52.0)
HCT: 28.4 % — ABNORMAL LOW (ref 39.0–52.0)
Hemoglobin: 7.7 g/dL — ABNORMAL LOW (ref 13.0–17.0)
Hemoglobin: 8.7 g/dL — ABNORMAL LOW (ref 13.0–17.0)
MCH: 23.8 pg — ABNORMAL LOW (ref 26.0–34.0)
MCH: 24.2 pg — ABNORMAL LOW (ref 26.0–34.0)
MCHC: 31.3 g/dL (ref 30.0–36.0)
MCHC: 30.6 g/dL (ref 30.0–36.0)
MCV: 76.2 fL — ABNORMAL LOW (ref 80.0–100.0)
MCV: 79.1 fL — ABNORMAL LOW (ref 80.0–100.0)
Platelets: 411 10*3/uL — ABNORMAL HIGH (ref 150–400)
Platelets: 416 10*3/uL — ABNORMAL HIGH (ref 150–400)
RBC: 3.23 MIL/uL — ABNORMAL LOW (ref 4.22–5.81)
RBC: 3.59 MIL/uL — ABNORMAL LOW (ref 4.22–5.81)
RDW: 20 % — ABNORMAL HIGH (ref 11.5–15.5)
RDW: 19.9 % — ABNORMAL HIGH (ref 11.5–15.5)
WBC: 11.4 10*3/uL — ABNORMAL HIGH (ref 4.0–10.5)
WBC: 8.5 10*3/uL (ref 4.0–10.5)
nRBC: 0 % (ref 0.0–0.2)
nRBC: 0 % (ref 0.0–0.2)

## 2022-06-30 LAB — MAGNESIUM: Magnesium: 1.7 mg/dL (ref 1.7–2.4)

## 2022-06-30 LAB — PREPARE RBC (CROSSMATCH)

## 2022-06-30 MED ORDER — PEG-KCL-NACL-NASULF-NA ASC-C 100 G PO SOLR
0.5000 | Freq: Once | ORAL | Status: AC
  Administered 2022-06-30: 100 g via ORAL

## 2022-06-30 MED ORDER — DIAZEPAM 5 MG PO TABS: 5.0000 mg | ORAL_TABLET | Freq: Once | ORAL | Status: DC | PRN

## 2022-06-30 MED ORDER — LORAZEPAM 1 MG PO TABS: 2.0000 mg | ORAL_TABLET | Freq: Once | ORAL | Status: DC | PRN

## 2022-06-30 MED ORDER — PEG-KCL-NACL-NASULF-NA ASC-C 100 G PO SOLR
0.5000 | Freq: Once | ORAL | Status: AC
  Administered 2022-06-30: 100 g via ORAL
  Filled 2022-06-30: qty 1

## 2022-06-30 MED ORDER — METOCLOPRAMIDE HCL 5 MG/ML IJ SOLN
10.0000 mg | Freq: Four times a day (QID) | INTRAMUSCULAR | Status: AC
  Administered 2022-06-30 – 2022-07-01 (×2): 10 mg via INTRAVENOUS
  Filled 2022-06-30 (×2): qty 2

## 2022-06-30 MED ORDER — INSULIN GLARGINE-YFGN 100 UNIT/ML ~~LOC~~ SOLN
5.0000 [IU] | Freq: Once | SUBCUTANEOUS | Status: AC
  Administered 2022-06-30: 5 [IU] via SUBCUTANEOUS
  Filled 2022-06-30: qty 0.05

## 2022-06-30 MED ORDER — SODIUM CHLORIDE 0.9% IV SOLUTION: Freq: Once | INTRAVENOUS | Status: AC

## 2022-06-30 MED ORDER — PANTOPRAZOLE SODIUM 40 MG PO TBEC
40.0000 mg | DELAYED_RELEASE_TABLET | Freq: Every day | ORAL | Status: DC
  Administered 2022-07-01 – 2022-07-03 (×3): 40 mg via ORAL
  Filled 2022-06-30 (×3): qty 1

## 2022-06-30 MED ORDER — INSULIN GLARGINE-YFGN 100 UNIT/ML ~~LOC~~ SOLN
25.0000 [IU] | Freq: Two times a day (BID) | SUBCUTANEOUS | Status: DC
  Administered 2022-06-30 – 2022-07-01 (×2): 25 [IU] via SUBCUTANEOUS
  Filled 2022-06-30 (×3): qty 0.25

## 2022-06-30 MED ORDER — PANTOPRAZOLE SODIUM 40 MG IV SOLR: 40.0000 mg | Freq: Two times a day (BID) | INTRAVENOUS | Status: DC

## 2022-06-30 MED ORDER — BISACODYL 5 MG PO TBEC: 10.0000 mg | DELAYED_RELEASE_TABLET | Freq: Four times a day (QID) | ORAL | Status: AC

## 2022-06-30 MED ORDER — MAGNESIUM SULFATE 4 GM/100ML IV SOLN
4.0000 g | Freq: Once | INTRAVENOUS | Status: AC
Start: 2022-06-30 — End: 2022-06-30
  Administered 2022-06-30: 4 g via INTRAVENOUS
  Filled 2022-06-30: qty 100

## 2022-06-30 MED ORDER — PEG-KCL-NACL-NASULF-NA ASC-C 100 G PO SOLR: 1.0000 | Freq: Once | ORAL | Status: DC

## 2022-06-30 MED ORDER — POTASSIUM CHLORIDE CRYS ER 20 MEQ PO TBCR
40.0000 meq | EXTENDED_RELEASE_TABLET | Freq: Once | ORAL | Status: AC
  Administered 2022-06-30: 40 meq via ORAL
  Filled 2022-06-30: qty 2

## 2022-06-30 NOTE — Consult Note (Cosign Needed)
[address]       [place] [address]             [phone]        Adit Curtis Voris [facility] Medical Record #[id number] Date of Birth: 12-08-1965  Referring: Bensimhon Primary Care: Charolette Forward, MD Primary Cardiologist:None  Chief Complaint:    Chief Complaint  Patient presents with   Chest Pain    History of Present Illness:      [name] is a 56 yo AA male with known history of DM, HTN, Dyslipidemia,  Nicotine abuse for > 30 years, currently 5-6 per day, Alcohol use 3-4 x per week, and CAD with previous MI with subsequent stent placement in 2012 to LAD, Diagonal, Ramus, and OM.  He takes all home medications as prescribed.  The patient presented to the Emergency Department on 9/5 with complaints of chest pain that started on Sunday 9/3.  However, the patient stated he has been experiencing pain for approximately 1 month, but it has gotten worse.  The symptoms started at rest while patient was watching football.  The pain occurred in the center of his chest with belching with associated shortness of breath, nausea/vomiting.  Workup in the ED showed reduced Oxygen saturations with improvement after 5L of oxygen.  CXR obtained showed evidence of vascular congestion.  Lab work was abnormal with elevated BNP at 639.  Troponin was at 139 but repeat was down to 106.  He was also noted to be anemic and patient admitted to experiencing black stools.  He has lower extremity edema as well.  He was treated with Lasix, NTG was not administered due to use of Viagra the night prior to presentation.  He was transferred to [facility] for further evaluation.  Cardiology consult was obtained who ruled patient in for NSTEMI and Acute systolic HF/Ischemic Cardiomyopathy.  They recommended cardiac catheterization and TEE. TEE showed a reduced EF of 40-45%, severe severe mid to distal anterior, anteroapical and anterolateral hypokinesis and basal to mid  inferior/inferolateral hypokinesis suggestive of multivessel territory ischemia/infarct and Moderate to severe Mitral Valve regurgitation. Catheterization performed on 9/6 showed advanced multivessel cor Neri artery disease with severe distal left main disease, very severe ostial circumflex stenosis, subtotal occlusion of the mid circumflex with occlusion of the OM branches, severe in-stent restenosis in the mid LAD, diffuse distal LAD disease, and severe mid RCA stenosis.  It was felt advanced heart failure team consult would be beneficial for medical optimization prior to surgical consultation.  He was evaluated by Dr. Haroldine Laws who initiated Milrinone for reduced PA pressures.  He was started on Aldactone and Entresto for GDMT.  They felt cardiac MRI would be needed to assess cardiac viability, this has not yet been completed.  They have closely monitored patient throughout his stay.  His blood sugars have been uncontrolled and diabetes coordinator has been assisting in insulin adjustment.  He was found to be anemic and has required blood transfusion with hemoglobin of 6.9 this morning.  He was started on antibiotics 9/7 for fever and suspected pneumonia with Rocephin and Zithromax.  The patient has his own teeth his last dental visit was about 1 year ago. He denies problems with his teeth.  The patient could do better with his activity level.  He currently works Warden/ranger.  He is able to walk up a flight of stairs w/o developing shortness of breath.  His mother had coronary disease in  her 73s.  The patient lives with a roommate who would be able to provide assistance at discharge if needed.  Overall he is feeling better since admission.  He denies chest pain and shortness of breath.  He does have chronic numbness of his right leg from previous back procedure.  He asks if vein can be taken from his left leg.  Current Activity/ Functional Status: Patient is independent with mobility/ambulation,  transfers, ADL's, IADL's.   Zubrod Score: At the time of surgery this patient's most appropriate activity status/level should be described as: '[]'$     0    Normal activity, no symptoms '[x]'$     1    Restricted in physical strenuous activity but ambulatory, able to do out light work '[]'$     2    Ambulatory and capable of self care, unable to do work activities, up and about                 more than 50%  Of the time                            '[]'$     3    Only limited self care, in bed greater than 50% of waking hours '[]'$     4    Completely disabled, no self care, confined to bed or chair '[]'$     5    Moribund  Past Medical History:  Diagnosis Date   Acid reflux    Concussion    Coronary artery disease    Diabetes ([place])    ETOH abuse    High cholesterol    Hypertension    MI (myocardial infarction) ([place])    2012   Sleep apnea    USES CPAP AS NEEDED   Tobacco abuse     Past Surgical History:  Procedure Laterality Date   KNEE ARTHROSCOPY     ORIF ULNAR FRACTURE Right 12/31/2016   Procedure: OPEN REDUCTION INTERNAL FIXATION (ORIF) both bone forearm fracture;  Surgeon: Roseanne Kaufman, MD;  Location: [facility];  Service: Orthopedics;  Laterality: Right;   RIGHT/LEFT HEART CATH AND CORONARY ANGIOGRAPHY N/A 06/25/2022   Procedure: RIGHT/LEFT HEART CATH AND CORONARY ANGIOGRAPHY;  Surgeon: [name], MD;  Location: [facility];  Service: Cardiovascular;  Laterality: N/A;   stents     TEE WITHOUT CARDIOVERSION N/A 06/25/2022   Procedure: TRANSESOPHAGEAL ECHOCARDIOGRAM (TEE);  Surgeon: Pixie Casino, MD;  Location: [facility] ENDOSCOPY;  Service: Cardiovascular;  Laterality: N/A;    Social History   Tobacco Use  Smoking Status Every Day   Packs/day: 0.25   Years: 30.00   Total pack years: 7.50   Types: Cigarettes  Smokeless Tobacco Never    Social History   Substance and Sexual Activity  Alcohol Use Yes   Alcohol/week: 6.0 standard drinks of alcohol   Types: 6 Cans of beer per week    Comment: occassionally on weekends     No Known Allergies  Current Facility-Administered Medications  Medication Dose Route Frequency Provider Last Rate Last Admin   0.9 %  sodium chloride infusion  250 mL Intravenous PRN [name], MD       acetaminophen (TYLENOL) tablet 650 mg  650 mg Oral Q4H PRN Pixie Casino, MD   650 mg at 06/26/22 2150   aspirin EC tablet 81 mg  81 mg Oral Daily Joette Catching, PA-C   81 mg at 06/30/22 4403  azithromycin (ZITHROMAX) tablet 500 mg  500 mg Oral Daily Arrien, Jimmy Picket, MD   500 mg at 06/30/22 9833   cefTRIAXone (ROCEPHIN) 2 g in sodium chloride 0.9 % 100 mL IVPB  2 g Intravenous Q24H Tawni Millers, MD 200 mL/hr at 06/29/22 1705 2 g at 06/29/22 1705   Chlorhexidine Gluconate Cloth 2 % PADS 6 each  6 each Topical Daily Arrien, Jimmy Picket, MD   6 each at 06/30/22 0924   diazepam (VALIUM) tablet 5 mg  5 mg Oral Once PRN Bensimhon, Shaune Pascal, MD       digoxin Fonnie Birken[place]) tablet 0.125 mg  0.125 mg Oral Daily Joette Catching, PA-C   0.125 mg at 06/30/22 8250   feeding supplement (ENSURE ENLIVE / ENSURE PLUS) liquid 237 mL  237 mL Oral BID BM Arrien, Jimmy Picket, MD   237 mL at 06/30/22 0924   guaiFENesin-dextromethorphan (ROBITUSSIN DM) 100-10 MG/5ML syrup 15 mL  15 mL Oral Q4H PRN Pixie Casino, MD   15 mL at 06/28/22 0009   heparin injection 5,000 Units  5,000 Units Subcutaneous Q8H Larey Dresser, MD   5,000 Units at 06/30/22 0529   insulin aspart (novoLOG) injection 0-15 Units  0-15 Units Subcutaneous TID WC Pixie Casino, MD   5 Units at 06/30/22 0725   insulin aspart (novoLOG) injection 0-5 Units  0-5 Units Subcutaneous QHS Pixie Casino, MD   3 Units at 06/29/22 2140   insulin aspart (novoLOG) injection 5 Units  5 Units Subcutaneous TID WC Arrien, Jimmy Picket, MD   5 Units at 06/30/22 0725   insulin glargine-yfgn (SEMGLEE) injection 25 Units  25 Units Subcutaneous BID Arrien, Jimmy Picket, MD        insulin glargine-yfgn [facility]) injection 5 Units  5 Units Subcutaneous Once Arrien, Jimmy Picket, MD       magnesium sulfate IVPB 4 g 100 mL  4 g Intravenous Once Bensimhon, Shaune Pascal, MD       milrinone (PRIMACOR) 20 MG/100 ML (0.2 mg/mL) infusion  0.125 mcg/kg/min Intravenous Continuous Larey Dresser, MD 2.71 mL/hr at 06/28/22 0917 0.125 mcg/kg/min at 06/28/22 0917   ondansetron (ZOFRAN) injection 4 mg  4 mg Intravenous Q6H PRN Pixie Casino, MD   4 mg at 06/27/22 0246   oxyCODONE (Oxy IR/ROXICODONE) immediate release tablet 5-10 mg  5-10 mg Oral Q4H PRN [name], MD       pantoprazole (PROTONIX) EC tablet 40 mg  40 mg Oral Daily Joette Catching, [place]   40 mg at 06/30/22 5397   rosuvastatin (CRESTOR) tablet 40 mg  40 mg Oral Daily Lyndee Leo, RPH   40 mg at 06/30/22 6734   sacubitril-valsartan (ENTRESTO) 24-26 mg per tablet  1 tablet Oral BID Bensimhon, Shaune Pascal, MD   1 tablet at 06/30/22 1937   sodium chloride flush (NS) 0.9 % injection 3 mL  3 mL Intravenous Janalee Dane, MD   3 mL at 06/30/22 0924   sodium chloride flush (NS) 0.9 % injection 3 mL  3 mL Intravenous PRN [name], MD       spironolactone (ALDACTONE) tablet 25 mg  25 mg Oral Daily Larey Dresser, MD   25 mg at 06/30/22 9024    Medications Prior to Admission  Medication Sig Dispense Refill Last Dose   aspirin 325 MG tablet Take 325 mg by mouth daily.   06/24/2022   Calcium Polycarbophil (FIBER) 625 MG TABS Take 2 tablets  by mouth daily.   Past Week   carvedilol (COREG) 25 MG tablet Take 25 mg by mouth 2 (two) times daily with a meal.   06/23/2022 at 1400   empagliflozin (JARDIANCE) 25 MG TABS tablet Take 0.5 tablets by mouth daily.   06/23/2022   glipiZIDE (GLUCOTROL) 5 MG tablet Take 5 mg by mouth daily. 30 minutes prior to a meal   06/23/2022   insulin glargine (LANTUS) 100 UNIT/ML Solostar Pen Inject 50 Units into the skin at bedtime.   06/23/2022   lisinopril (ZESTRIL) 10 MG tablet Take  40 mg by mouth daily.   06/23/2022   metFORMIN (GLUCOPHAGE-XR) 500 MG 24 hr tablet Take 500 mg by mouth 2 (two) times daily with a meal.   06/23/2022   ondansetron (ZOFRAN) 8 MG tablet Take 4 mg by mouth 3 (three) times daily as needed for nausea.   06/22/2022   sildenafil (VIAGRA) 100 MG tablet Take 50 mg by mouth daily as needed for erectile dysfunction.   06/21/2022   simvastatin (ZOCOR) 40 MG tablet Take 80 mg by mouth at bedtime.   06/23/2022   Vitamin D, Ergocalciferol, (DRISDOL) 1.25 MG (50000 UNIT) CAPS capsule Take 50,000 Units by mouth every 7 (seven) days.   06/17/2022    Family History  Problem Relation Age of Onset   Diabetes Mother    Review of Systems:   ROS    Cardiac Review of Systems: Y or  [    ]= no  Chest Pain [ Y   ]  Resting SOB [  Y ] Exertional SOB  [Y  ]  Orthopnea [  ]   Pedal Edema [ Y  ]    Palpitations [ N ] Syncope  [  ]   Presyncope [   ]  General Review of Systems: [Y] = yes [  ]=no Constitional: recent weight change [Y  ]; anorexia [  ]; fatigue [ Y ]; nausea [ Y ]; night sweats [  ]; fever [ N ]; or chills Aqua.Slicker  ]                                                               Dental: Last Dentist visit: 1 year ago  Eye : blurred vision [  ]; diplopia [   ]; vision changes [  ];  Amaurosis fugax[  ]; Resp: cough [Y  ];  wheezing[  ];  hemoptysis[ N ]; shortness of breath[  ]; paroxysmal nocturnal dyspnea[  ]; dyspnea on exertion[ Y ]; or orthopnea[  ];  GI:  gallstones[ N ], vomiting[Y  ];  dysphagia[  ]; melena[  ];  hematochezia [  ]; heartburn[  ];   Hx of  Colonoscopy[  ]; GU: kidney stones Aqua.Slicker  ]; hematuria[  ];   dysuria [  ];  nocturia[  ];  history of     obstruction [  ]; urinary frequency [  ]             Skin: rash, swelling[ N ];, hair loss[  ];  peripheral edema[  Y];  or itching[  ]; Musculosketetal: myalgias[  ];  joint swelling[  ];  joint erythema[  ];  joint pain[  ];  back pain[  ];  Heme/Lymph: bruising[  ];  bleeding[  ];  anemia[  ];  Neuro: TIA[   ];  headaches[N  ];  stroke[ N ];  vertigo[  ];  seizures[  ];   paresthesias[  ];  difficulty walking[N  ];  Psych:depression[ Y ]; anxiety[ Y ];  Endocrine: diabetes[ Y ];  thyroid dysfunction[  ];  Physical Exam: BP 109/77   Pulse (!) 108   Temp 98.7 F (37.1 C)   Resp (!) 24   Ht 6' (1.829 m)   Wt 67.7 kg   SpO2 98%   BMI 20.24 kg/m   General appearance: alert, cooperative, and no distress Head: Normocephalic, without obvious abnormality, atraumatic Neck: no adenopathy, no carotid bruit, no JVD, supple, symmetrical, trachea midline, and thyroid not enlarged, symmetric, no tenderness/mass/nodules Resp: clear to auscultation bilaterally Cardio: regular rate and rhythm and + systolic murmur GI: soft, non-tender; bowel sounds normal; no masses,  no organomegaly Extremities: edema R>L Neurologic: Grossly normal  Diagnostic Studies & Laboratory data:     Recent Radiology Findings:   DG Chest 1 View  Result Date: 06/29/2022 CLINICAL DATA:  Difficulty breathing EXAM: CHEST  1 VIEW COMPARISON:  Previous studies including the chest radiograph done on 06/26/2022 and CT done on 06/27/2022 FINDINGS: Transverse diameter of heart is slightly increased. There is interval improvement in the aeration in both lungs suggesting resolving pulmonary edema. There is residual prominence of interstitial markings in right parahilar region and left lower lung field. No new focal infiltrates are seen. There is no pleural effusion or pneumothorax. Tip of right IJ central venous catheter is seen in superior vena cava. IMPRESSION: There is significant interval decrease in alveolar densities in both lungs, more so on the left side suggesting resolving pulmonary edema or resolving bilateral pneumonia. There is residual prominence of markings in the right parahilar region and left lower lung field suggesting residual interstitial edema or interstitial pneumonia. No new focal infiltrates are seen. Electronically  Signed   By: Elmer Picker M.D.   On: 06/29/2022 16:11     I have independently reviewed the above radiologic studies and discussed with the patient   Recent Lab Findings: Lab Results  Component Value Date   WBC 11.4 (H) 06/30/2022   HGB 7.7 (L) 06/30/2022   HCT 24.6 (L) 06/30/2022   PLT 411 (H) 06/30/2022   GLUCOSE 217 (H) 06/30/2022   CHOL 140 06/25/2022   TRIG 66 06/25/2022   HDL 43 06/25/2022   LDLCALC 84 06/25/2022   ALT 30 01/03/2021   AST 41 01/03/2021   NA 133 (L) 06/30/2022   K 3.7 06/30/2022   CL 96 (L) 06/30/2022   CREATININE 0.87 06/30/2022   BUN 13 06/30/2022   CO2 27 06/30/2022   TSH 1.331 07/30/2019   INR 0.95 03/19/2011   HGBA1C 8.2 (H) 06/24/2022    Assessment / Plan:      Severe Multivessel- reduced EF and evidence of severe hypokinesis on TEE-- Cardiac MRI for viability has been ordered by AHF Severe Mitral Regurgitation- overall good dentition, dental consult not needed at this time Acute on Chronic systolic CHF/Ischemic Cardiomyopathy- EF 40 % on TEE... currently on Milrinone at 0.125, Entresto, Aldactone.Marland Kitchen Co-ox 65% New Diagnosis of Anemia- Hgb at 6.9 on Coox this morning, receiving 1 unit of packed cells.. GI consult has been obtained and is pending DM- poorly controlled sugars have been consistently running 200-300s, diabetes coordinator is assisting with management.. ideally would benefit from better control prior to proceeding  with surgery ID- leukocytosis improving on Rocephin/Zithromax for suspect CAP Nicotine Abuse- current use 5-6 per day, 30 year use Dispo- patient overall doing better since admission, AHF has ordered MRI to look at cardiac viability, remains on Milrinone at 0.125 with Co-ox of 65 %.  GI consult has been requested and is pending... patient will likely be a candidate for CABG/MV Repair vs. Replacement.. .Dr. Lavonna Monarch to evaluate and discuss case with Dr. Haroldine Laws.  He will follow up with further recommendations and timing of  surgery.  I  spent 60 minutes counseling the patient face to face.   Ellwood Handler, PA-C 06/30/2022 10:51 AM  I have reviewed the note above and agree with the findings and plan Pt has been admitted in diastolic acute congestive heart failure secondary to NSTEMI and has been succesfully medically managed with excellent diuresis and improved hemodynamics. Pt has been found to have severe diffuse MV CAD with poor targets however the LAD, DIAG, distal RCA, and distal OM are potential. I did not hear much of a murmur on exam yesterday and awaiting the MRI for final surgical planning. Will await the findings on the GI endoscopy procedure tomorrow and schedule for surgery following that.

## 2022-06-30 NOTE — H&P (View-Only) (Signed)
Attending physician's note   I have taken a history, reviewed the chart, and examined the patient. I performed a substantive portion of this encounter, including complete performance of at least one of the key components, in conjunction with the APP. I agree with the APP's note, impression, and recommendations with my edits.   56 year old male with medical history as outlined below to include history of severe mitral regurgitation, multivessel CAD with prior MI in 2012 requiring PCI, diabetes, CHF with EF 40-45%, HTN, HLD, GERD, tobacco use disorder, EtOH pancreatitis 2014, admitted with chest pain, NSTEMI, acute on chronic systolic CHF/ischemic cardiomyopathy.  Currently undergoing evaluation for CABG/MVR.  Was noted to have downtrending hemoglobin without overt bleeding and FOBT negative stool.  Has had colonoscopy x2 in his lifetime, with last being 04/2017 and notable for rectal hyperplastic polyps.  He thinks his last EGD was about 2012 (although given different timeline to [name] earlier today).  Admission evaluation notable for the following:  Comparison labs from 12/2020: - H/H 12.9/37.3, MCV/RDW 96/12  06/24/2022 (date of admission): - H/H 8.2/20.5, MCV/RDW 77/21 - PLT 364 - BUN/creatinine 17/0.87  -Ferritin 18, iron 21, TIBC 549, sat 4% - B12 468, folate 7  - Transfused 1 unit PRBCs for Hgb 7.1 on 9/9 - H/H 7.7/24.6 today.  Transfusing another 1 unit PRBCs - FOBT negative - BUN/creatinine 13/0.87 today  1) Iron deficiency anemia 2) Folate deficiency - No overt bleeding, but IDA requiring 2 unit PRBCs on this admission certainly requires endoscopic evaluation.  Given potential need for CABG/MVR, plan for expedited EGD/colonoscopy to r/o high risk lesion prior to surgery and requiring anticoagulation/antiplatelet therapy - Posttransfusion CBC check with serial CBCs and additional blood  products as needed per protocol - Continue high-dose PPI for the time being - Tentative plan for clears tomorrow, bowel prep tomorrow evening, and EGD/colonoscopy on 9/13 - Will benefit from IV iron later on this admission.  Need to wait until after MRI - Start folic acid  3) CAD w/ NSTEMI 4) CHF 5) Severe mitral regurgitation 6) Ischemic cardiomyopathy - Management per Heart Failure and CT surgery services - Planning for MRI this evening - Discussed elevated periprocedural risks given underlying comorbidities with acute exacerbations.  However, given anemia requiring RBC transfusion, need to pursue EGD/colonoscopy for therapeutic intent as outlined above  7) Diabetes - Management per [name], [name], [name] [phone] office                                                                                   [facility] Consult: 11:49 AM 06/30/2022  LOS: 6 days    Referring Provider: Dr [name]  Primary Care Physician:  Anthony Forward, MD Primary Gastroenterologist:  [name]     Reason for Consultation:  IDA.     HPI: [name] is a 56 y.o. male.  Retired Nature conservation officer.  Most of his care is through the [place]. PMH OSA.  CAD.  MI, cardiac stenting in 2012.  Diabetes type 2, insulin requiring.  Htn.  HLD.  Degenerativ spine dz, surgery in 2021.  Alcohol use disorder.  ETOH Pancreatitis with a lipase in  600s in 2014, upper 300s in June 2021.  Underwent EGD at [facility] within last 6 to 8 weeks for reflux sxs and c/O periodic AM non-bloody, clear phlegm vomiting and nausea, overall wt loss up to 30 # in 2 years.  No dysphagia.  Controlled reflux sxs.  No dysphagia.  Stools are brown.  No history of excessive bleeding or bruising.  No NSAIDs.   Previous colonoscopy performed when the patient was between 23 and 81 years of age.  He recalls having had polyps and being advised he would need repeat study in 10 years. We do not [facility].    07/2019 Limited abdominal altered sound demonstrated echogenic liver differential including steatosis or hepatocellular disease such as cirrhosis.  Gallbladder polyps. 03/2020 complete abdominal ultrasound demonstrated multiple gallbladder polyps measuring up to 9.6 mm.  No evidence for cholecystitis.  Diffuse hepatic steatosis.  Hypoechoic pancreas likely due to pancreatitis.  Trace perihepatic and perisplenic fluid 6/20202 MRI, MRCP: Motion degraded exam.  Mild interstitial edematous pancreatitis similar to slightly improved compared with previous study.  No focal fluid collection or necrosis.  No pancreatic divisum.  No ductal dilatation.  No choledocholithiasis.  Trace ascites.  Moderate to marked hepatic steatosis. 12/2020 CTAP with contrast demonstrated proximal acute pancreatitis without pseudocyst formation.  Colon diverticulosis.  Tiny fat-containing umbilical hernia.  Aortic atherosclerosis and coronary artery calcifications.  Hgb nadir 7.1.. 7.7 today.  Was ~ 6 in June 2022.   MCV 76.  WBCs 14.9.  platelets normal    Iron low 21.  Iron sats low 4%.  Ferritin 26.  Folate and B12 normal. FOBT negative. Na low 133.  BUN, creatinine normal.  LFTs normal. BNP 830.   Trop I 139... 1125.  Admission almost a week ago for acute on chronic CHF, ischemic cardiomyopathy, MI.  EF 40 to 45% with severe MR and severe multivessel CAD.  Cardiac cath 06/25/2022.  Cardiology following and plan is for CABG/MVR.  Has required milrinone for facilitation of diuresis.  Received 2 PRBCs thus far, on 9/9 and again today 9/11. Due to the patient's anemia.  Cardiology requesting GI evaluation and possible Endo/colon.   Fm Hx DM, CVA in mom.  Htn in Dad. Retired after 8 years of service in the [employer].  Works in custodial services.  Smokes about 3 to 4 cigarettes a day.  Admits to drinking alcohol 2-3 times a week generally a beer or a mixed drink.  Used to drink a lot more heavily. Single.  Lives  with a male, nonromantic roommate.  Has a steady male partner with whom he does not live.  Past Medical History:  Diagnosis Date   Acid reflux    Concussion    Coronary artery disease    Diabetes (HCC)    ETOH abuse    High cholesterol    Hypertension    MI (myocardial infarction) ([place])    2012   Sleep apnea    USES CPAP AS NEEDED   Tobacco abuse     Past Surgical History:  Procedure Laterality Date   KNEE ARTHROSCOPY     ORIF ULNAR FRACTURE Right 12/31/2016   Procedure: OPEN REDUCTION INTERNAL FIXATION (ORIF) both bone forearm fracture;  Surgeon: Roseanne Kaufman, MD;  Location: [facility];  Service: Orthopedics;  Laterality: Right;   RIGHT/LEFT HEART CATH AND CORONARY ANGIOGRAPHY N/A 06/25/2022   Procedure: RIGHT/LEFT HEART CATH AND CORONARY ANGIOGRAPHY;  Surgeon: [name], MD;  Location: [place] [facility];  Service: Cardiovascular;  Laterality: N/A;   stents     TEE WITHOUT CARDIOVERSION N/A 06/25/2022   Procedure: TRANSESOPHAGEAL ECHOCARDIOGRAM (TEE);  Surgeon: Pixie Casino, MD;  Location[facility] ENDOSCOPY;  Service: Cardiovascular;  Laterality: N/A;    Prior to Admission medications   Medication Sig Start Date End Date Taking? Authorizing Provider  aspirin 325 MG tablet Take 325 mg by mouth daily.   Yes [provider]  Calcium Polycarbophil (FIBER) 625 MG TABS Take 2 tablets by mouth daily. 04/15/22  Yes [provider]  carvedilol (COREG) 25 MG tablet Take 25 mg by mouth 2 (two) times daily with a meal.   Yes [provider]  empagliflozin (JARDIANCE) 25 MG TABS tablet Take 0.5 tablets by mouth daily. 12/27/21  Yes [provider]  glipiZIDE (GLUCOTROL) 5 MG tablet Take 5 mg by mouth daily. 30 minutes prior to a meal   Yes [provider]  insulin glargine (LANTUS) 100 UNIT/ML Solostar Pen Inject 50 Units into the skin at bedtime.   Yes [provider]  lisinopril (ZESTRIL) 10 MG tablet Take 40 mg by mouth daily. 06/19/22   Yes [provider]  metFORMIN (GLUCOPHAGE-XR) 500 MG 24 hr tablet Take 500 mg by mouth 2 (two) times daily with a meal. 06/19/22  Yes [provider]  ondansetron (ZOFRAN) 8 MG tablet Take 4 mg by mouth 3 (three) times daily as needed for nausea. 05/27/22  Yes [provider]  sildenafil (VIAGRA) 100 MG tablet Take 50 mg by mouth daily as needed for erectile dysfunction.   Yes [provider]  simvastatin (ZOCOR) 40 MG tablet Take 80 mg by mouth at bedtime. 12/27/21  Yes [provider]  Vitamin D, Ergocalciferol, (DRISDOL) 1.25 MG (50000 UNIT) CAPS capsule Take 50,000 Units by mouth every 7 (seven) days.   Yes [provider]    Scheduled Meds:  aspirin EC  81 mg Oral Daily   azithromycin  500 mg Oral Daily   Chlorhexidine Gluconate Cloth  6 each Topical Daily   digoxin  0.125 mg Oral Daily   feeding supplement  237 mL Oral BID BM   heparin injection (subcutaneous)  5,000 Units Subcutaneous Q8H   insulin aspart  0-15 Units Subcutaneous TID WC   insulin aspart  0-5 Units Subcutaneous QHS   insulin aspart  5 Units Subcutaneous TID WC   insulin glargine-yfgn  25 Units Subcutaneous BID   insulin glargine-yfgn  5 Units Subcutaneous Once   pantoprazole (PROTONIX) IV  40 mg Intravenous Q12H   rosuvastatin  40 mg Oral Daily   sacubitril-valsartan  1 tablet Oral BID   sodium chloride flush  3 mL Intravenous Q12H   spironolactone  25 mg Oral Daily   Infusions:  sodium chloride     cefTRIAXone (ROCEPHIN)  IV 2 g (06/29/22 1705)   magnesium sulfate bolus IVPB 4 g (06/30/22 1136)   milrinone 0.125 mcg/kg/min (06/28/22 0917)   PRN Meds: sodium chloride, acetaminophen, diazepam, guaiFENesin-dextromethorphan, ondansetron (ZOFRAN) IV, oxyCODONE, sodium chloride flush   Allergies as of 06/24/2022   (No Known Allergies)    Family History  Problem Relation Age of Onset   Diabetes Mother     Social History   Socioeconomic History    Marital status: Divorced    Spouse name: Not on file   Number of children: Not on file   Years of education: Not on file   Highest education level: Not on file  Occupational History  Not on file  Tobacco Use   Smoking status: Every Day    Packs/day: 0.25    Years: 30.00    Total pack years: 7.50    Types: Cigarettes   Smokeless tobacco: Never  Vaping Use   Vaping Use: Never used  Substance and Sexual Activity   Alcohol use: Yes    Alcohol/week: 6.0 standard drinks of alcohol    Types: 6 Cans of beer per week    Comment: occassionally on weekends   Drug use: No   Sexual activity: Not on file  Other Topics Concern   Not on file  Social History Narrative   Not on file   Social Determinants of Health   Financial Resource Strain: Not on file  Food Insecurity: No Food Insecurity (06/24/2022)   Hunger Vital Sign    Worried About Running Out of Food in the Last Year: Never true    Ran Out of Food in the Last Year: Never true  Transportation Needs: No Transportation Needs (06/24/2022)   PRAPARE - Hydrologist (Medical): No    Lack of Transportation (Non-Medical): No  Physical Activity: Not on file  Stress: Not on file  Social Connections: Not on file  Intimate Partner Violence: Not At Risk (06/24/2022)   Humiliation, Afraid, Rape, and Kick questionnaire    Fear of Current or Ex-Partner: No    Emotionally Abused: No    Physically Abused: No    Sexually Abused: No    REVIEW OF SYSTEMS: Constitutional: Fatigue, weakness. ENT:  No nose bleeds Pulm: Shortness of breath proved.  Chest pain resolved. CV:  No palpitations.  R > L lower extremity edema. GU:  No hematuria, no frequency GI: See HPI. Heme: See HPI. Transfusions: Patient does not recall prior transfusions. Neuro:  No headaches, no peripheral tingling or numbness.  No dizziness.  No syncope. Derm:  No itching, no rash or sores.  Endocrine:  No sweats or chills.  No polyuria or  dysuria Immunization: Not queried. Travel: Not queried.   PHYSICAL EXAM: Vital signs in last 24 hours: Vitals:   06/30/22 0816 06/30/22 1123  BP: 109/77   Pulse: (!) 108 91  Resp: (!) 24   Temp: 98.7 F (37.1 C) 98.3 F (36.8 C)  SpO2:  98%   Wt Readings from Last 3 Encounters:  06/30/22 67.7 kg  01/03/21 65.8 kg  04/04/20 65.4 kg    General: Patient looks well, appears stated age.  Pleasant and fully alert. Head: No signs of head trauma.   Eyes: EOMI.  Conjunctiva pink.  No scleral icterus. Ears: No obvious hearing deficit. Nose: No congestion or discharge Mouth: Excellent dentition.  Mucosa is pink, moist, clear.  Tongue midline Neck: No JVD, no masses, no thyromegaly Lungs: Clear bilaterally without labored breathing or cough. Heart: RRR.  Do not appreciate M or G.  S1, S2 present. Abdomen: Soft without tenderness.  No organomegaly, hernias, bruits.  No distention..   Rectal: Deferred stool submitted to lab 2 days ago was FOBT negative Musc/Skeltl: No joint redness, swelling or gross deformity. Extremities: Slight, barely pitting ankle/pedal edema more prominent on the right. Neurologic: Oriented x3.  Fluid speech.  No gross deficits.  No tremors. Skin: Suspicious lesions, rashes, sores. Nodes: No cervical adenopathy Psych: Calm, cooperative, pleasant.  Intake/Output from previous day: 09/10 0701 - 09/11 0700 In: 1171.9 [P.O.:960; I.V.:211.9] Out: 900 [Urine:900] Intake/Output this shift: Total I/O In: 404 [Blood:404] Out: -   LAB RESULTS:  Recent Labs    06/28/22 0410 06/28/22 2232 06/29/22 0430 06/30/22 0528  WBC 16.2*  --  14.9* 11.4*  HGB 7.1* 8.0* 7.9* 7.7*  HCT 23.3* 26.1* 26.0* 24.6*  PLT 330  --  371 411*   BMET Lab Results  Component Value Date   NA 133 (L) 06/30/2022   NA 131 (L) 06/29/2022   NA 128 (L) 06/28/2022   K 3.7 06/30/2022   K 4.1 06/29/2022   K 4.2 06/28/2022   CL 96 (L) 06/30/2022   CL 94 (L) 06/29/2022   CL 94 (L)  06/28/2022   CO2 27 06/30/2022   CO2 27 06/29/2022   CO2 26 06/28/2022   GLUCOSE 217 (H) 06/30/2022   GLUCOSE 344 (H) 06/29/2022   GLUCOSE 337 (H) 06/28/2022   BUN 13 06/30/2022   BUN 12 06/29/2022   BUN 10 06/28/2022   CREATININE 0.87 06/30/2022   CREATININE 0.85 06/29/2022   CREATININE 1.00 06/28/2022   CALCIUM 9.2 06/30/2022   CALCIUM 9.1 06/29/2022   CALCIUM 8.6 (L) 06/28/2022   LFT No results for input(s): "PROT", "ALBUMIN", "AST", "ALT", "ALKPHOS", "BILITOT", "BILIDIR", "IBILI" in the last 72 hours. PT/INR Lab Results  Component Value Date   INR 0.95 03/19/2011   Hepatitis Panel No results for input(s): "HEPBSAG", "HCVAB", "HEPAIGM", "HEPBIGM" in the last 72 hours. C-Diff No components found for: "CDIFF" Lipase     Component Value Date/Time   LIPASE 81 (H) 01/03/2021 2133    Drugs of Abuse     Component Value Date/Time   LABOPIA POSITIVE (A) 01/04/2021 0300   COCAINSCRNUR NONE DETECTED 01/04/2021 0300   LABBENZ NONE DETECTED 01/04/2021 0300   AMPHETMU NONE DETECTED 01/04/2021 0300   THCU NONE DETECTED 01/04/2021 0300   LABBARB NONE DETECTED 01/04/2021 0300     RADIOLOGY STUDIES: DG Chest 1 View  Result Date: 06/29/2022 CLINICAL DATA:  Difficulty breathing EXAM: CHEST  1 VIEW COMPARISON:  Previous studies including the chest radiograph done on 06/26/2022 and CT done on 06/27/2022 FINDINGS: Transverse diameter of heart is slightly increased. There is interval improvement in the aeration in both lungs suggesting resolving pulmonary edema. There is residual prominence of interstitial markings in right parahilar region and left lower lung field. No new focal infiltrates are seen. There is no pleural effusion or pneumothorax. Tip of right IJ central venous catheter is seen in superior vena cava. IMPRESSION: There is significant interval decrease in alveolar densities in both lungs, more so on the left side suggesting resolving pulmonary edema or resolving bilateral  pneumonia. There is residual prominence of markings in the right parahilar region and left lower lung field suggesting residual interstitial edema or interstitial pneumonia. No new focal infiltrates are seen. Electronically Signed   By: Elmer Picker M.D.   On: 06/29/2022 16:11      IMPRESSION:   Iron deficiency anemia without FOBT negative.  Received 2 PRBCs thus far.  Has not had any overt bleeding as inpatient or outpatient.  Recent EGD performed at the [place] within the last couple of months.  Patient recalls no significant findings and no adjustment of meds prior to that.  Colonoscopy at the [facility] performed between the ages of 20 and 59 with patient recalling presence of polyps.    NSTEMI.  Prior MI and stents several years ago.  Now w Isch CM, advanced multivessel coronary disease and severe MR.  Heart failure team added milrinone, Aldactone, Entresto.  Cardiac MRI pending.  Has been seen by cardiac surgery  with likely plans for CABG/MVR pending findings on MRI.    Hx pancreatitis, likely alcohol induced.  No history of pseudocyst or complications from the pancreatitis.  Currently still drinking, if his report is reliable this is moderate consisting of a beer or mixed drink 2-3 times a week. Latest imaging in 2021 confirming gallbladder polyps, hepatic steatosis, pancreatitis, colon diverticulosis, small fat-containing umbilical hernia.   There are no current LFTs.   PLAN:        Dr. Bryan Lemma will see patient.  Patient looks fit enough to undergo colonoscopy.  Since we do not have access to recent EGD records, should probably pursue EGD as well.  Will obtain LFTs given his history of fatty liver.   Azucena Freed  06/30/2022, 11:49 AM Phone [phone]

## 2022-06-30 NOTE — Progress Notes (Signed)
1 unit PRBCs ordered to be transfused at a slow rate to prevent volume overload for hemoglobin of 6.9 on Co-ox sample.  We will repeat CBC posttransfusion.

## 2022-06-30 NOTE — Progress Notes (Signed)
Patient ID: [name], male   DOB: 1966-02-21, 56 y.o.   MRN: [id number]   Advanced Heart Failure Rounding Note  PCP-Cardiologist: None   Subjective:    09/06: Started milrinone 0.125 to facilitate diuresis in setting of low-output 09/07: Milrinone increased to 0.25. 09/09: Milrinone decreased to 0.125   Getting another unit RBCs today.   CVP  2-3  Denies CP or SOB. No obvious bleeding Co-ox 65%   Objective:   Weight Range: 67.7 kg Body mass index is 20.24 kg/m.   Vital Signs:   Temp:  [98.1 F (36.7 C)-98.9 F (37.2 C)] 98.7 F (37.1 C) (09/11 0816) Pulse Rate:  [106-119] 108 (09/11 0816) Resp:  [15-28] 24 (09/11 0816) BP: (109-124)/(73-85) 109/77 (09/11 0816) SpO2:  [96 %-99 %] 98 % (09/11 0751) Weight:  [67.7 kg] 67.7 kg (09/11 0500) Last BM Date : 06/25/22  Weight change: Filed Weights   06/28/22 0628 06/29/22 0502 06/30/22 0500  Weight: 69.6 kg 70.4 kg 67.7 kg    Intake/Output:   Intake/Output Summary (Last 24 hours) at 06/30/2022 1017 Last data filed at 06/30/2022 0600 Gross per 24 hour  Intake 931.89 ml  Output 900 ml  Net 31.89 ml      Physical Exam   General:  Well appearing. No resp difficulty HEENT: normal Neck: supple. no JVD. Carotids 2+ bilat; no bruits. No lymphadenopathy or thryomegaly appreciated. Cor: PMI nondisplaced. Regular tachy. 2/6 SEM at apex. Lungs: clear Abdomen: soft, nontender, nondistended. No hepatosplenomegaly. No bruits or masses. Good bowel sounds. Extremities: no cyanosis, clubbing, rash, edema Neuro: alert & orientedx3, cranial nerves grossly intact. moves all 4 extremities w/o difficulty. Affect pleasant   Telemetry   Sinus Tach 100-110 (personally reviewed).    Labs    CBC Recent Labs    06/29/22 0430 06/30/22 0528  WBC 14.9* 11.4*  HGB 7.9* 7.7*  HCT 26.0* 24.6*  MCV 76.2* 76.2*  PLT 371 411*    Basic Metabolic Panel Recent Labs    06/29/22 0430 06/30/22 0528  NA 131* 133*  K 4.1 3.7   CL 94* 96*  CO2 27 27  GLUCOSE 344* 217*  BUN 12 13  CREATININE 0.85 0.87  CALCIUM 9.1 9.2  MG  --  1.7    Liver Function Tests No results for input(s): "AST", "ALT", "ALKPHOS", "BILITOT", "PROT", "ALBUMIN" in the last 72 hours. No results for input(s): "LIPASE", "AMYLASE" in the last 72 hours. Cardiac Enzymes No results for input(s): "CKTOTAL", "CKMB", "CKMBINDEX", "TROPONINI" in the last 72 hours.  BNP: BNP (last 3 results) Recent Labs    06/24/22 0809 06/25/22 0133  BNP 639.0* 830.5*     ProBNP (last 3 results) No results for input(s): "PROBNP" in the last 8760 hours.   D-Dimer No results for input(s): "DDIMER" in the last 72 hours. Hemoglobin A1C No results for input(s): "HGBA1C" in the last 72 hours.  Fasting Lipid Panel No results for input(s): "CHOL", "HDL", "LDLCALC", "TRIG", "CHOLHDL", "LDLDIRECT" in the last 72 hours.  Thyroid Function Tests No results for input(s): "TSH", "T4TOTAL", "T3FREE", "THYROIDAB" in the last 72 hours.  Invalid input(s): "FREET3"  Other results:   Imaging    DG Chest 1 View  Result Date: 06/29/2022 CLINICAL DATA:  Difficulty breathing EXAM: CHEST  1 VIEW COMPARISON:  Previous studies including the chest radiograph done on 06/26/2022 and CT done on 06/27/2022 FINDINGS: Transverse diameter of heart is slightly increased. There is interval improvement in the aeration in both lungs suggesting resolving pulmonary  edema. There is residual prominence of interstitial markings in right parahilar region and left lower lung field. No new focal infiltrates are seen. There is no pleural effusion or pneumothorax. Tip of right IJ central venous catheter is seen in superior vena cava. IMPRESSION: There is significant interval decrease in alveolar densities in both lungs, more so on the left side suggesting resolving pulmonary edema or resolving bilateral pneumonia. There is residual prominence of markings in the right parahilar region and left  lower lung field suggesting residual interstitial edema or interstitial pneumonia. No new focal infiltrates are seen. Electronically Signed   By: Elmer Picker M.D.   On: 06/29/2022 16:11     Medications:     Scheduled Medications:  aspirin EC  81 mg Oral Daily   azithromycin  500 mg Oral Daily   Chlorhexidine Gluconate Cloth  6 each Topical Daily   digoxin  0.125 mg Oral Daily   feeding supplement  237 mL Oral BID BM   heparin injection (subcutaneous)  5,000 Units Subcutaneous Q8H   insulin aspart  0-15 Units Subcutaneous TID WC   insulin aspart  0-5 Units Subcutaneous QHS   insulin aspart  5 Units Subcutaneous TID WC   insulin glargine-yfgn  20 Units Subcutaneous BID   pantoprazole  40 mg Oral Daily   rosuvastatin  40 mg Oral Daily   sacubitril-valsartan  1 tablet Oral BID   sodium chloride flush  3 mL Intravenous Q12H   spironolactone  25 mg Oral Daily    Infusions:  sodium chloride     cefTRIAXone (ROCEPHIN)  IV 2 g (06/29/22 1705)   magnesium sulfate bolus IVPB     milrinone 0.125 mcg/kg/min (06/28/22 0917)    PRN Medications: sodium chloride, acetaminophen, diazepam, guaiFENesin-dextromethorphan, ondansetron (ZOFRAN) IV, oxyCODONE, sodium chloride flush    Patient Profile   56 y/o AAM w/ h/o multivessel CAD s/p remote MI in 2012 and multiple PCIs to LAD, diag, ramus and OM vessels, HTN, HLD, Type 2 DM and tobacco use, admitted for NSTEMI and acute CHF. 2D Echo and TEE w/ severe mitral regurgitation. LVEF 40-45%, RV ok. Cath w/ multivessel CAD including high grade LM disease, elevated filling pressures and preserved CO.     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 Will get cMRI today. TCTS consulted  2.  Acute on chronic systolic CHF/Ischemic cardiomyopathy - Echo with EF  40-45%.  - RHC w/ RA 8, PA 48/23 (38), PCWP mean 26, LVEDP 21,  PA sat 49% - Today, co-ox 65% CVP 2-3   Remains mildly tachy rate around 100-110 - Continue digoxin 0.125.  - Keep milrinone at 0.125 to maintain hemodynamic stability while making decisions on forward path.  - No diuretic today.   - Continue Entresto 24/26 bid.   - Continue spironolactone 25 mg daily.  - No beta blocker for now with low-output - Consider low dose torsemide tomorrow    3. NSTEMI w/ Multivessel CAD  - Known hx prior CAD with MI in 2012 and prior multivessel PCI - HS trop peak 1,477 - LHC w/ severe MVCAD including high grade LM disease  - ASA + high intensity statin, heparin gtt - Think best path forwards would be CABG-MVR.   4. Type 2DM  - per primary team  - A1c 8.2%  - Eventual  SGLT2i - Will need good control of blood sugars if goes for CABG   5. IDA - Hgb 8.4---> 7.4 --> 7.1 -> 1 unit PRBCs -> 7.9 today -> 7.7 today - no obvious source blood loss, FOBT negative.  - Iron  21, sats 4%. No feraheme yet, give if opt not to do MRI.  - Will likely need GI to see prior to CABG   6. ETOH and tobacco abuse - Watch for ETOH withdrawal  7. ID - CT chest with pulmonary edema  - Cough improved with diuresis.  - He is now on azithromycin. - Suspect CHF was cause of cough etc.   Length of Stay: 6  Glori Bickers, MD  06/30/2022, 10:17 AM  Advanced Heart Failure Team Pager [phone] (M-F; 7a - 5p)  Please contact [facility] for night-coverage after hours (5p -7a ) and weekends on [email or web address]

## 2022-06-30 NOTE — Progress Notes (Signed)
Mobility Specialist Progress Note    06/30/22 1244  Mobility  Activity Ambulated independently in hallway  Level of Assistance Standby assist, set-up cues, supervision of patient - no hands on  Assistive Device None  Distance Ambulated (ft) 420 ft  Activity Response Tolerated well  $Mobility charge 1 Mobility   Pre-Mobility: 112 HR Post-Mobility: 110 HR  Pt received in chair and agreeable. No complaints on walk. Returned to chair with call bell in reach.    Hildred Alamin Mobility Specialist

## 2022-06-30 NOTE — Plan of Care (Signed)

## 2022-06-30 NOTE — Inpatient Diabetes Management (Signed)
Inpatient Diabetes Program Recommendations  AACE/ADA: New Consensus Statement on Inpatient Glycemic Control (2015)  Target Ranges:  Prepandial:   less than 140 mg/dL      Peak postprandial:   less than 180 mg/dL (1-2 hours)      Critically ill patients:  140 - 180 mg/dL   Lab Results  Component Value Date   GLUCAP 227 (H) 06/30/2022   HGBA1C 8.2 (H) 06/24/2022    Review of Glycemic Control  Latest Reference Range & Units 06/29/22 15:52 06/29/22 19:54 06/29/22 21:33 06/30/22 06:27  Glucose-Capillary 70 - 99 mg/dL 286 (H) 259 (H) 286 (H) 227 (H)  (H): Data is abnormally high Diabetes history: Type 2 DM Outpatient Diabetes medications: Lantus 50 units QHS, Jardiance 12.5 mg QD, Metformin 500 mg BID, Glipizide 5 mg QD Current orders for Inpatient glycemic control: Semglee 20 units BID, Novolog 5 units TID, Novolog 0-15 units & HS  Inpatient Diabetes Program Recommendations:    Consider increasing Semglee to 25 units BID (patient's home dose). Secure chat sent to MD.   Thanks, Bronson Curb, MSN, RNC-OB Diabetes Coordinator [phone] (8a-5p)

## 2022-06-30 NOTE — Progress Notes (Signed)
Progress Note   Patient: [name] VOJ:[id number] DOB: [birth year] DOA: 06/24/2022     6 DOS: the patient was seen and examined on 06/30/2022   Brief hospital course: [name] was admitted to the hospital with the working diagnosis of decompensated heart failure.  56 yo male with the past medical history of coronary artery disease, hypertension, and dyslipidemia who presented with chest pain. Intermittent chest pain, left sided pressure and radiated to the right. Because persistent symptoms he came to the ED. On his initial physical evaluation his blood pressure was 111/80, HR 89, RR 26 and 02 saturation 93%, lungs with rales but not wheezing, heart with S1 and S2 present and rhythmic, abdomen with no distention and positive lower extremity edema   Na 137, K 3,4 Cl 99, bicarbonate 23, glucose 295 bun 17 cr 0,87  BNP 639 High sensitive troponin 139  Wbc 10,9 hgb 8,2 plt 364   Chest radiograph with bilateral hilar vascular congestion, bilateral interstitial infiltrates more at the lower lobes, cephalization of the vasculature and small bilateral pleural effusions.   EKG 116 bpm, normal axis and normal intervals, sinus rhythm with V5 and V6 ST depression with no significant T wave changes, poor R R wave progression.   Echocardiogram with reduced LV systolic function, positive Mitral regurgitation and severe multivessel CAD and IDA   Patient has been placed on inotropic support and diuresis with furosemide  09/07 positive fever.  09/08 patient started on antibiotic therapy for pneumonia, (present on admission).  09/09 one unit PRBC transfusion with good toleration.  09/11 worsening hgb, getting the second PRBC transfusion   Assessment and Plan: Acute systolic heart failure (HCC) Echocardiogram with left ventricle EF 40 to 45%, mild dilated internal cavity, moderate hypokinesis of the left ventricular, basal mid inferolateral wall and anterolateral wall. RV systolic function is  preserved. LA with severe dilatation. Severe mitral regurgitation   TEE with moderate to severe mitral regurgitation   09/06 cardiac catheterization with advance multivessel coronary artery disease.  Severe mitral regurgitation.  PCWP 26 Cardiac output 5,4 per [place].  Urine output is 900 cc over last 24 hrs Blood pressure systolic 993 to 716 mmHg.  SV02 65.2  Milrinone drip rate of 0,125 mcg  Continue medical therapy with entresto, digoxin and spironolactone. Continue to hold on loop diuretic for now.    Acute hypoxemic respiratory failure.  Acute pulmonary edema.  Continue with increased 02 requirements, 02 saturation is 97% on 6 L/min per [place].   CAD (coronary artery disease) Multivessel coronary artery disease NSTEMI   No B blockade due to low output heart failure decompensation.    Continue with statin therapy and aspirin. Anticoagulation with IV heparin, possible CABG during this hospitalization.   Follow up with CT surgery recommendations.   Hypertension Continue blood pressure support with milrinone  Tolerating well entresto.   Type 2 diabetes mellitus with hyperlipidemia (HCC) Hyperglycemia.   Continue with uncontrolled hyperglycemia.  Plan to increase basal insulin to 25 units bid  and continue with 5 units pre meal along with insulin sliding scale.    Continue with statin therapy  Hypokalemia Hyponatremia. Hypomagnesemia  Renal function with serum cr at 0,87 , K is 3,7, bicarbonate 27 Plan to continue with spironolactone  Follow up renal function in am.   GERD (gastroesophageal reflux disease) Continue with pantoprazole Patient had dark stools on admission, Persistent low hgb despite PRBC transfusion, today he is getting his second units  Today with no hematemesis or melena,  no hemoptysis or hematuria Patient [facility] Will consult GI to assess for occult bleeding, he will probably need cardiac surgery on this admission.   Microcytic  anemia Cell count stable   Tobacco abuse Smoking cessation   Iron deficiency anemia Follow up Hgb is 7,5 with plt 332  Iron stores with serum iron 21. TIBC 549, ferritin 26 and transferrin saturation 4  Follow up hgb is 7,7 Plan to add second PRBC today.   Left upper lobe pneumonia 09/08 fever 100.4, Blood cultures no growth Respiratory panel is negative  Chest radiograph with bilateral interstitial infiltrates with cephalization of the vasculature and hilar congestion. Infiltrates more dense at the left upper lobe.   Follow up chest CT with bilateral dense ground glass opacities bilaterally, centrally with small right pleural effusion. Follow up wbc is trending down to 14,9.   09/10 follow up chest radiograph with improvement in infiltrates.   Continue antibiotic therapy with ceftriaxone and azithromycin, will plan to complete 5 days total.   Out of bed to chair tid with meals Pt and Ot.  Incentive spirometer and flutter valve.         Subjective: Patient is feeling better, his dyspnea and lower extremity edema continue to improve, no hemoptysis, no melena and no hematuria,  no abdominal pain, or hematemesis   Physical Exam: Vitals:   06/30/22 0551 06/30/22 0751 06/30/22 0816 06/30/22 1123  BP: 123/85 114/79 109/77   Pulse: (!) 106 (!) 115 (!) 108 91  Resp: (!) 24  (!) 24   Temp: 98.9 F (37.2 C) 98.1 F (36.7 C) 98.7 F (37.1 C) 98.3 F (36.8 C)  TempSrc: Oral Oral  Oral  SpO2: 99% 98%  98%  Weight:      Height:       Neurology awake and alert ENT mild pallor Cardiovascular S1 and S2 present and rhythmic with no gallops, positive murmur at the apex Respiratory with no rales or wheezing Abdomen with no distention  Lower extremity edema +  more on the right  Data Reviewed:    Family Communication: no family at the bedside   Disposition: Status is: Inpatient Remains inpatient appropriate because: heart failure and severe coronary artery disease, on  inotropic therapy   Planned Discharge Destination: Home   Author: Tawni Millers, MD 06/30/2022 11:35 AM  For on call review [email or web address].

## 2022-06-30 NOTE — Consult Note (Addendum)
Attending physician's note   I have taken a history, reviewed the chart, and examined the patient. I performed a substantive portion of this encounter, including complete performance of at least one of the key components, in conjunction with the APP. I agree with the APP's note, impression, and recommendations with my edits.   56 year old male with medical history as outlined below to include history of severe mitral regurgitation, multivessel CAD with prior MI in 2012 requiring PCI, diabetes, CHF with EF 40-45%, HTN, HLD, GERD, tobacco use disorder, EtOH pancreatitis 2014, admitted with chest pain, NSTEMI, acute on chronic systolic CHF/ischemic cardiomyopathy.  Currently undergoing evaluation for CABG/MVR.  Was noted to have downtrending hemoglobin without overt bleeding and FOBT negative stool.  Has had colonoscopy x2 in his lifetime, with last being 04/2017 and notable for rectal hyperplastic polyps.  He thinks his last EGD was about 2012 (although given different timeline to [name] earlier today).  Admission evaluation notable for the following:  Comparison labs from 12/2020: - H/H 12.9/37.3, MCV/RDW 96/12  06/24/2022 (date of admission): - H/H 8.2/20.5, MCV/RDW 77/21 - PLT 364 - BUN/creatinine 17/0.87  -Ferritin 18, iron 21, TIBC 549, sat 4% - B12 468, folate 7  - Transfused 1 unit PRBCs for Hgb 7.1 on 9/9 - H/H 7.7/24.6 today.  Transfusing another 1 unit PRBCs - FOBT negative - BUN/creatinine 13/0.87 today  1) Iron deficiency anemia 2) Folate deficiency - No overt bleeding, but IDA requiring 2 unit PRBCs on this admission certainly requires endoscopic evaluation.  Given potential need for CABG/MVR, plan for expedited EGD/colonoscopy to r/o high risk lesion prior to surgery and requiring anticoagulation/antiplatelet therapy - Posttransfusion CBC check with serial CBCs and additional blood  products as needed per protocol - Continue high-dose PPI for the time being - Tentative plan for clears tomorrow, bowel prep tomorrow evening, and EGD/colonoscopy on 9/13 - Will benefit from IV iron later on this admission.  Need to wait until after MRI - Start folic acid  3) CAD w/ NSTEMI 4) CHF 5) Severe mitral regurgitation 6) Ischemic cardiomyopathy - Management per Heart Failure and CT surgery services - Planning for MRI this evening - Discussed elevated periprocedural risks given underlying comorbidities with acute exacerbations.  However, given anemia requiring RBC transfusion, need to pursue EGD/colonoscopy for therapeutic intent as outlined above  7) Diabetes - Management per [facility] [place], [place] [phone] office                                                                                   [facility] Consult: 11:49 AM 06/30/2022  LOS: 6 days    Referring Provider: Dr Cathlean Sauer  Primary Care Physician:  Charolette Forward, MD Primary Gastroenterologist:  Althia Forts     Reason for Consultation:  IDA.     HPI: [name] is a 56 y.o. male.  Retired Nature conservation officer.  Most of his care is through the [place]. PMH OSA.  CAD.  MI, cardiac stenting in 2012.  Diabetes type 2, insulin requiring.  Htn.  HLD.  Degenerativ spine dz, surgery in 2021.  Alcohol use disorder.  ETOH Pancreatitis with a lipase in  600s in 2014, upper 300s in June 2021.  Underwent EGD at [facility] within last 6 to 8 weeks for reflux sxs and c/O periodic AM non-bloody, clear phlegm vomiting and nausea, overall wt loss up to 30 # in 2 years.  No dysphagia.  Controlled reflux sxs.  No dysphagia.  Stools are brown.  No history of excessive bleeding or bruising.  No NSAIDs.   Previous colonoscopy performed when the patient was between 25 and 22 years of age.  He recalls having had polyps and being advised he would need repeat study in 10 years. We do not have [facility].    07/2019 Limited abdominal altered sound demonstrated echogenic liver differential including steatosis or hepatocellular disease such as cirrhosis.  Gallbladder polyps. 03/2020 complete abdominal ultrasound demonstrated multiple gallbladder polyps measuring up to 9.6 mm.  No evidence for cholecystitis.  Diffuse hepatic steatosis.  Hypoechoic pancreas likely due to pancreatitis.  Trace perihepatic and perisplenic fluid 6/20202 MRI, MRCP: Motion degraded exam.  Mild interstitial edematous pancreatitis similar to slightly improved compared with previous study.  No focal fluid collection or necrosis.  No pancreatic divisum.  No ductal dilatation.  No choledocholithiasis.  Trace ascites.  Moderate to marked hepatic steatosis. 12/2020 CTAP with contrast demonstrated proximal acute pancreatitis without pseudocyst formation.  Colon diverticulosis.  Tiny fat-containing umbilical hernia.  Aortic atherosclerosis and coronary artery calcifications.  Hgb nadir 7.1.. 7.7 today.  Was ~ 100 in June 2022.   MCV 76.  WBCs 14.9.  platelets normal    Iron low 21.  Iron sats low 4%.  Ferritin 26.  Folate and B12 normal. FOBT negative. Na low 133.  BUN, creatinine normal.  LFTs normal. BNP 830.   Trop I 139... 1125.  Admission almost a week ago for acute on chronic CHF, ischemic cardiomyopathy, MI.  EF 40 to 45% with severe MR and severe multivessel CAD.  Cardiac cath 06/25/2022.  Cardiology following and plan is for CABG/MVR.  Has required milrinone for facilitation of diuresis.  Received 2 PRBCs thus far, on 9/9 and again today 9/11. Due to the patient's anemia.  Cardiology requesting GI evaluation and possible Endo/colon.   Fm Hx DM, CVA in mom.  Htn in Dad. Retired after 8 years of service in the [employer].  Works in custodial services.  Smokes about 3 to 4 cigarettes a day.  Admits to drinking alcohol 2-3 times a week generally a beer or a mixed drink.  Used to drink a lot more heavily. Single.  Lives  with a male, nonromantic roommate.  Has a steady male partner with whom he does not live.  Past Medical History:  Diagnosis Date   Acid reflux    Concussion    Coronary artery disease    Diabetes (HCC)    ETOH abuse    High cholesterol    Hypertension    MI (myocardial infarction) ([place])    2012   Sleep apnea    USES CPAP AS NEEDED   Tobacco abuse     Past Surgical History:  Procedure Laterality Date   KNEE ARTHROSCOPY     ORIF ULNAR FRACTURE Right 12/31/2016   Procedure: OPEN REDUCTION INTERNAL FIXATION (ORIF) both bone forearm fracture;  Surgeon: Roseanne Kaufman, MD;  Location: [facility];  Service: Orthopedics;  Laterality: Right;   RIGHT/LEFT HEART CATH AND CORONARY ANGIOGRAPHY N/A 06/25/2022   Procedure: RIGHT/LEFT HEART CATH AND CORONARY ANGIOGRAPHY;  Surgeon: [name], MD;  Location: [facility];  Service: Cardiovascular;  Laterality: N/A;   stents     TEE WITHOUT CARDIOVERSION N/A 06/25/2022   Procedure: TRANSESOPHAGEAL ECHOCARDIOGRAM (TEE);  Surgeon: Pixie Casino, MD;  Location: [facility] ENDOSCOPY;  Service: Cardiovascular;  Laterality: N/A;    Prior to Admission medications   Medication Sig Start Date End Date Taking? Authorizing Provider  aspirin 325 MG tablet Take 325 mg by mouth daily.   Yes [provider]  Calcium Polycarbophil (FIBER) 625 MG TABS Take 2 tablets by mouth daily. 04/15/22  Yes [provider]  carvedilol (COREG) 25 MG tablet Take 25 mg by mouth 2 (two) times daily with a meal.   Yes [provider]  empagliflozin (JARDIANCE) 25 MG TABS tablet Take 0.5 tablets by mouth daily. 12/27/21  Yes [provider]  glipiZIDE (GLUCOTROL) 5 MG tablet Take 5 mg by mouth daily. 30 minutes prior to a meal   Yes [provider]  insulin glargine (LANTUS) 100 UNIT/ML Solostar Pen Inject 50 Units into the skin at bedtime.   Yes [provider]  lisinopril (ZESTRIL) 10 MG tablet Take 40 mg by mouth daily. 06/19/22   Yes [provider]  metFORMIN (GLUCOPHAGE-XR) 500 MG 24 hr tablet Take 500 mg by mouth 2 (two) times daily with a meal. 06/19/22  Yes [provider]  ondansetron (ZOFRAN) 8 MG tablet Take 4 mg by mouth 3 (three) times daily as needed for nausea. 05/27/22  Yes [provider]  sildenafil (VIAGRA) 100 MG tablet Take 50 mg by mouth daily as needed for erectile dysfunction.   Yes [provider]  simvastatin (ZOCOR) 40 MG tablet Take 80 mg by mouth at bedtime. 12/27/21  Yes [provider]  Vitamin D, Ergocalciferol, (DRISDOL) 1.25 MG (50000 UNIT) CAPS capsule Take 50,000 Units by mouth every 7 (seven) days.   Yes [provider]    Scheduled Meds:  aspirin EC  81 mg Oral Daily   azithromycin  500 mg Oral Daily   Chlorhexidine Gluconate Cloth  6 each Topical Daily   digoxin  0.125 mg Oral Daily   feeding supplement  237 mL Oral BID BM   heparin injection (subcutaneous)  5,000 Units Subcutaneous Q8H   insulin aspart  0-15 Units Subcutaneous TID WC   insulin aspart  0-5 Units Subcutaneous QHS   insulin aspart  5 Units Subcutaneous TID WC   insulin glargine-yfgn  25 Units Subcutaneous BID   insulin glargine-yfgn  5 Units Subcutaneous Once   pantoprazole (PROTONIX) IV  40 mg Intravenous Q12H   rosuvastatin  40 mg Oral Daily   sacubitril-valsartan  1 tablet Oral BID   sodium chloride flush  3 mL Intravenous Q12H   spironolactone  25 mg Oral Daily   Infusions:  sodium chloride     cefTRIAXone (ROCEPHIN)  IV 2 g (06/29/22 1705)   magnesium sulfate bolus IVPB 4 g (06/30/22 1136)   milrinone 0.125 mcg/kg/min (06/28/22 0917)   PRN Meds: sodium chloride, acetaminophen, diazepam, guaiFENesin-dextromethorphan, ondansetron (ZOFRAN) IV, oxyCODONE, sodium chloride flush   Allergies as of 06/24/2022   (No Known Allergies)    Family History  Problem Relation Age of Onset   Diabetes Mother     Social History   Socioeconomic History    Marital status: Divorced    Spouse name: Not on file   Number of children: Not on file   Years of education: Not on file   Highest education level: Not on file  Occupational History  Not on file  Tobacco Use   Smoking status: Every Day    Packs/day: 0.25    Years: 30.00    Total pack years: 7.50    Types: Cigarettes   Smokeless tobacco: Never  Vaping Use   Vaping Use: Never used  Substance and Sexual Activity   Alcohol use: Yes    Alcohol/week: 6.0 standard drinks of alcohol    Types: 6 Cans of beer per week    Comment: occassionally on weekends   Drug use: No   Sexual activity: Not on file  Other Topics Concern   Not on file  Social History Narrative   Not on file   Social Determinants of Health   Financial Resource Strain: Not on file  Food Insecurity: No Food Insecurity (06/24/2022)   Hunger Vital Sign    Worried About Running Out of Food in the Last Year: Never true    Ran Out of Food in the Last Year: Never true  Transportation Needs: No Transportation Needs (06/24/2022)   PRAPARE - Hydrologist (Medical): No    Lack of Transportation (Non-Medical): No  Physical Activity: Not on file  Stress: Not on file  Social Connections: Not on file  Intimate Partner Violence: Not At Risk (06/24/2022)   Humiliation, Afraid, Rape, and Kick questionnaire    Fear of Current or Ex-Partner: No    Emotionally Abused: No    Physically Abused: No    Sexually Abused: No    REVIEW OF SYSTEMS: Constitutional: Fatigue, weakness. ENT:  No nose bleeds Pulm: Shortness of breath proved.  Chest pain resolved. CV:  No palpitations.  R > L lower extremity edema. GU:  No hematuria, no frequency GI: See HPI. Heme: See HPI. Transfusions: Patient does not recall prior transfusions. Neuro:  No headaches, no peripheral tingling or numbness.  No dizziness.  No syncope. Derm:  No itching, no rash or sores.  Endocrine:  No sweats or chills.  No polyuria or  dysuria Immunization: Not queried. Travel: Not queried.   PHYSICAL EXAM: Vital signs in last 24 hours: Vitals:   06/30/22 0816 06/30/22 1123  BP: 109/77   Pulse: (!) 108 91  Resp: (!) 24   Temp: 98.7 F (37.1 C) 98.3 F (36.8 C)  SpO2:  98%   Wt Readings from Last 3 Encounters:  06/30/22 67.7 kg  01/03/21 65.8 kg  04/04/20 65.4 kg    General: Patient looks well, appears stated age.  Pleasant and fully alert. Head: No signs of head trauma.   Eyes: EOMI.  Conjunctiva pink.  No scleral icterus. Ears: No obvious hearing deficit. Nose: No congestion or discharge Mouth: Excellent dentition.  Mucosa is pink, moist, clear.  Tongue midline Neck: No JVD, no masses, no thyromegaly Lungs: Clear bilaterally without labored breathing or cough. Heart: RRR.  Do not appreciate M or G.  S1, S2 present. Abdomen: Soft without tenderness.  No organomegaly, hernias, bruits.  No distention..   Rectal: Deferred stool submitted to lab 2 days ago was FOBT negative Musc/Skeltl: No joint redness, swelling or gross deformity. Extremities: Slight, barely pitting ankle/pedal edema more prominent on the right. Neurologic: Oriented x3.  Fluid speech.  No gross deficits.  No tremors. Skin: Suspicious lesions, rashes, sores. Nodes: No cervical adenopathy Psych: Calm, cooperative, pleasant.  Intake/Output from previous day: 09/10 0701 - 09/11 0700 In: 1171.9 [P.O.:960; I.V.:211.9] Out: 900 [Urine:900] Intake/Output this shift: Total I/O In: 404 [Blood:404] Out: -   LAB RESULTS:  Recent Labs    06/28/22 0410 06/28/22 2232 06/29/22 0430 06/30/22 0528  WBC 16.2*  --  14.9* 11.4*  HGB 7.1* 8.0* 7.9* 7.7*  HCT 23.3* 26.1* 26.0* 24.6*  PLT 330  --  371 411*   BMET Lab Results  Component Value Date   NA 133 (L) 06/30/2022   NA 131 (L) 06/29/2022   NA 128 (L) 06/28/2022   K 3.7 06/30/2022   K 4.1 06/29/2022   K 4.2 06/28/2022   CL 96 (L) 06/30/2022   CL 94 (L) 06/29/2022   CL 94 (L)  06/28/2022   CO2 27 06/30/2022   CO2 27 06/29/2022   CO2 26 06/28/2022   GLUCOSE 217 (H) 06/30/2022   GLUCOSE 344 (H) 06/29/2022   GLUCOSE 337 (H) 06/28/2022   BUN 13 06/30/2022   BUN 12 06/29/2022   BUN 10 06/28/2022   CREATININE 0.87 06/30/2022   CREATININE 0.85 06/29/2022   CREATININE 1.00 06/28/2022   CALCIUM 9.2 06/30/2022   CALCIUM 9.1 06/29/2022   CALCIUM 8.6 (L) 06/28/2022   LFT No results for input(s): "PROT", "ALBUMIN", "AST", "ALT", "ALKPHOS", "BILITOT", "BILIDIR", "IBILI" in the last 72 hours. PT/INR Lab Results  Component Value Date   INR 0.95 03/19/2011   Hepatitis Panel No results for input(s): "HEPBSAG", "HCVAB", "HEPAIGM", "HEPBIGM" in the last 72 hours. C-Diff No components found for: "CDIFF" Lipase     Component Value Date/Time   LIPASE 81 (H) 01/03/2021 2133    Drugs of Abuse     Component Value Date/Time   LABOPIA POSITIVE (A) 01/04/2021 0300   COCAINSCRNUR NONE DETECTED 01/04/2021 0300   LABBENZ NONE DETECTED 01/04/2021 0300   AMPHETMU NONE DETECTED 01/04/2021 0300   THCU NONE DETECTED 01/04/2021 0300   LABBARB NONE DETECTED 01/04/2021 0300     RADIOLOGY STUDIES: DG Chest 1 View  Result Date: 06/29/2022 CLINICAL DATA:  Difficulty breathing EXAM: CHEST  1 VIEW COMPARISON:  Previous studies including the chest radiograph done on 06/26/2022 and CT done on 06/27/2022 FINDINGS: Transverse diameter of heart is slightly increased. There is interval improvement in the aeration in both lungs suggesting resolving pulmonary edema. There is residual prominence of interstitial markings in right parahilar region and left lower lung field. No new focal infiltrates are seen. There is no pleural effusion or pneumothorax. Tip of right IJ central venous catheter is seen in superior vena cava. IMPRESSION: There is significant interval decrease in alveolar densities in both lungs, more so on the left side suggesting resolving pulmonary edema or resolving bilateral  pneumonia. There is residual prominence of markings in the right parahilar region and left lower lung field suggesting residual interstitial edema or interstitial pneumonia. No new focal infiltrates are seen. Electronically Signed   By: Elmer Picker M.D.   On: 06/29/2022 16:11      IMPRESSION:   Iron deficiency anemia without FOBT negative.  Received 2 PRBCs thus far.  Has not had any overt bleeding as inpatient or outpatient.  Recent EGD performed at the [place] within the last couple of months.  Patient recalls no significant findings and no adjustment of meds prior to that.  Colonoscopy at the [facility] performed between the ages of 16 and 76 with patient recalling presence of polyps.    NSTEMI.  Prior MI and stents several years ago.  Now w Isch CM, advanced multivessel coronary disease and severe MR.  Heart failure team added milrinone, Aldactone, Entresto.  Cardiac MRI pending.  Has been seen by cardiac surgery  with likely plans for CABG/MVR pending findings on MRI.    Hx pancreatitis, likely alcohol induced.  No history of pseudocyst or complications from the pancreatitis.  Currently still drinking, if his report is reliable this is moderate consisting of a beer or mixed drink 2-3 times a week. Latest imaging in 2021 confirming gallbladder polyps, hepatic steatosis, pancreatitis, colon diverticulosis, small fat-containing umbilical hernia.   There are no current LFTs.   PLAN:        Dr. Bryan Lemma will see patient.  Patient looks fit enough to undergo colonoscopy.  Since we do not have access to recent EGD records, should probably pursue EGD as well.  Will obtain LFTs given his history of fatty liver.   Azucena Freed  06/30/2022, 11:49 AM Phone [phone]

## 2022-07-01 ENCOUNTER — Inpatient Hospital Stay (HOSPITAL_COMMUNITY): Payer: No Typology Code available for payment source | Admitting: Certified Registered Nurse Anesthetist

## 2022-07-01 ENCOUNTER — Encounter (HOSPITAL_COMMUNITY): Payer: Self-pay | Admitting: Internal Medicine

## 2022-07-01 ENCOUNTER — Inpatient Hospital Stay (HOSPITAL_COMMUNITY): Payer: No Typology Code available for payment source

## 2022-07-01 ENCOUNTER — Encounter (HOSPITAL_COMMUNITY)
Admission: EM | Disposition: A | Discharge status: Home / Self Care | Payer: Self-pay | Source: Home / Self Care | Attending: Internal Medicine

## 2022-07-01 DIAGNOSIS — K640 First degree hemorrhoids: Secondary | ICD-10-CM

## 2022-07-01 DIAGNOSIS — Z7984 Long term (current) use of oral hypoglycemic drugs: Secondary | ICD-10-CM

## 2022-07-01 DIAGNOSIS — D123 Benign neoplasm of transverse colon: Secondary | ICD-10-CM

## 2022-07-01 DIAGNOSIS — D509 Iron deficiency anemia, unspecified: Secondary | ICD-10-CM

## 2022-07-01 DIAGNOSIS — K5730 Diverticulosis of large intestine without perforation or abscess without bleeding: Secondary | ICD-10-CM

## 2022-07-01 DIAGNOSIS — K449 Diaphragmatic hernia without obstruction or gangrene: Secondary | ICD-10-CM

## 2022-07-01 DIAGNOSIS — E119 Type 2 diabetes mellitus without complications: Secondary | ICD-10-CM

## 2022-07-01 DIAGNOSIS — K635 Polyp of colon: Secondary | ICD-10-CM

## 2022-07-01 DIAGNOSIS — K222 Esophageal obstruction: Secondary | ICD-10-CM

## 2022-07-01 DIAGNOSIS — Z01810 Encounter for preprocedural cardiovascular examination: Secondary | ICD-10-CM

## 2022-07-01 DIAGNOSIS — D122 Benign neoplasm of ascending colon: Secondary | ICD-10-CM

## 2022-07-01 HISTORY — PX: BIOPSY: SHX5522

## 2022-07-01 HISTORY — PX: COLONOSCOPY: SHX5424

## 2022-07-01 HISTORY — PX: POLYPECTOMY: SHX5525

## 2022-07-01 HISTORY — PX: HEMOSTASIS CLIP PLACEMENT: SHX6857

## 2022-07-01 HISTORY — PX: ESOPHAGOGASTRODUODENOSCOPY (EGD) WITH PROPOFOL: SHX5813

## 2022-07-01 LAB — BPAM RBC
Blood Product Expiration Date: 202310052359
Blood Product Expiration Date: 202310072359
ISSUE DATE / TIME: 202309091736
ISSUE DATE / TIME: 202309110755
Unit Type and Rh: 6200
Unit Type and Rh: 6200

## 2022-07-01 LAB — TYPE AND SCREEN
ABO/RH(D): A POS
Antibody Screen: NEGATIVE
Unit division: 0
Unit division: 0

## 2022-07-01 LAB — COOXEMETRY PANEL
Carboxyhemoglobin: 2.3 % — ABNORMAL HIGH (ref 0.5–1.5)
Methemoglobin: 5.1 % — ABNORMAL HIGH (ref 0.0–1.5)
O2 Saturation: 64.7 %
Total hemoglobin: 7.2 g/dL — ABNORMAL LOW (ref 12.0–16.0)

## 2022-07-01 LAB — GLUCOSE, CAPILLARY
Glucose-Capillary: 148 mg/dL — ABNORMAL HIGH (ref 70–99)
Glucose-Capillary: 160 mg/dL — ABNORMAL HIGH (ref 70–99)
Glucose-Capillary: 203 mg/dL — ABNORMAL HIGH (ref 70–99)
Glucose-Capillary: 158 mg/dL — ABNORMAL HIGH (ref 70–99)
Glucose-Capillary: 131 mg/dL — ABNORMAL HIGH (ref 70–99)
Glucose-Capillary: 123 mg/dL — ABNORMAL HIGH (ref 70–99)
Glucose-Capillary: 67 mg/dL — ABNORMAL LOW (ref 70–99)
Glucose-Capillary: 89 mg/dL (ref 70–99)
Glucose-Capillary: 102 mg/dL — ABNORMAL HIGH (ref 70–99)
Glucose-Capillary: 200 mg/dL — ABNORMAL HIGH (ref 70–99)

## 2022-07-01 LAB — CULTURE, BLOOD (ROUTINE X 2)
Culture: NO GROWTH
Culture: NO GROWTH
Special Requests: ADEQUATE
Special Requests: ADEQUATE

## 2022-07-01 LAB — POCT I-STAT EG7
Acid-Base Excess: 2 mmol/L (ref 0.0–2.0)
Bicarbonate: 26.1 mmol/L (ref 20.0–28.0)
Calcium, Ion: 1.16 mmol/L (ref 1.15–1.40)
HCT: 24 % — ABNORMAL LOW (ref 39.0–52.0)
Hemoglobin: 8.2 g/dL — ABNORMAL LOW (ref 13.0–17.0)
O2 Saturation: 49 %
Potassium: 3.5 mmol/L (ref 3.5–5.1)
Sodium: 134 mmol/L — ABNORMAL LOW (ref 135–145)
TCO2: 27 mmol/L (ref 22–32)
pCO2, Ven: 35.3 mmHg — ABNORMAL LOW (ref 44–60)
pH, Ven: 7.477 — ABNORMAL HIGH (ref 7.25–7.43)
pO2, Ven: 24 mmHg — CL (ref 32–45)

## 2022-07-01 LAB — CBC
HCT: 29.2 % — ABNORMAL LOW (ref 39.0–52.0)
Hemoglobin: 8.7 g/dL — ABNORMAL LOW (ref 13.0–17.0)
MCH: 23.6 pg — ABNORMAL LOW (ref 26.0–34.0)
MCHC: 29.8 g/dL — ABNORMAL LOW (ref 30.0–36.0)
MCV: 79.1 fL — ABNORMAL LOW (ref 80.0–100.0)
Platelets: 472 10*3/uL — ABNORMAL HIGH (ref 150–400)
RBC: 3.69 MIL/uL — ABNORMAL LOW (ref 4.22–5.81)
RDW: 20 % — ABNORMAL HIGH (ref 11.5–15.5)
WBC: 9.2 10*3/uL (ref 4.0–10.5)
nRBC: 0 % (ref 0.0–0.2)

## 2022-07-01 LAB — COMPREHENSIVE METABOLIC PANEL
ALT: 231 U/L — ABNORMAL HIGH (ref 0–44)
AST: 335 U/L — ABNORMAL HIGH (ref 15–41)
Albumin: 2.3 g/dL — ABNORMAL LOW (ref 3.5–5.0)
Alkaline Phosphatase: 123 U/L (ref 38–126)
Anion gap: 9 (ref 5–15)
BUN: 10 mg/dL (ref 6–20)
CO2: 24 mmol/L (ref 22–32)
Calcium: 9.1 mg/dL (ref 8.9–10.3)
Chloride: 101 mmol/L (ref 98–111)
Creatinine, Ser: 0.74 mg/dL (ref 0.61–1.24)
GFR, Estimated: >60 mL/min (ref >60)
Glucose, Bld: 133 mg/dL — ABNORMAL HIGH (ref 70–99)
Potassium: 4.2 mmol/L (ref 3.5–5.1)
Sodium: 134 mmol/L — ABNORMAL LOW (ref 135–145)
Total Bilirubin: 0.7 mg/dL (ref 0.3–1.2)
Total Protein: 7.1 g/dL (ref 6.5–8.1)

## 2022-07-01 LAB — POCT I-STAT 7, (LYTES, BLD GAS, ICA,H+H)
Acid-Base Excess: 2 mmol/L (ref 0.0–2.0)
Bicarbonate: 24.4 mmol/L (ref 20.0–28.0)
Calcium, Ion: 1.15 mmol/L (ref 1.15–1.40)
HCT: 25 % — ABNORMAL LOW (ref 39.0–52.0)
Hemoglobin: 8.5 g/dL — ABNORMAL LOW (ref 13.0–17.0)
O2 Saturation: 92 %
Potassium: 3.6 mmol/L (ref 3.5–5.1)
Sodium: 134 mmol/L — ABNORMAL LOW (ref 135–145)
TCO2: 25 mmol/L (ref 22–32)
pCO2 arterial: 28.6 mmHg — ABNORMAL LOW (ref 32–48)
pH, Arterial: 7.538 — ABNORMAL HIGH (ref 7.35–7.45)
pO2, Arterial: 55 mmHg — ABNORMAL LOW (ref 83–108)

## 2022-07-01 LAB — MAGNESIUM: Magnesium: 2.1 mg/dL (ref 1.7–2.4)

## 2022-07-01 SURGERY — ESOPHAGOGASTRODUODENOSCOPY (EGD) WITH PROPOFOL
Anesthesia: Monitor Anesthesia Care

## 2022-07-01 MED ORDER — LIDOCAINE HCL (CARDIAC) PF 100 MG/5ML IV SOSY
PREFILLED_SYRINGE | INTRAVENOUS | Status: DC | PRN
  Administered 2022-07-01: 60 mg via INTRAVENOUS

## 2022-07-01 MED ORDER — PHENYLEPHRINE 80 MCG/ML (10ML) SYRINGE FOR IV PUSH (FOR BLOOD PRESSURE SUPPORT)
PREFILLED_SYRINGE | INTRAVENOUS | Status: DC | PRN
  Administered 2022-07-01: 160 ug via INTRAVENOUS
  Administered 2022-07-01: 80 ug via INTRAVENOUS
  Administered 2022-07-01 (×3): 160 ug via INTRAVENOUS
  Administered 2022-07-01: 80 ug via INTRAVENOUS

## 2022-07-01 MED ORDER — PROPOFOL 500 MG/50ML IV EMUL
INTRAVENOUS | Status: DC | PRN
  Administered 2022-07-01: 100 ug/kg/min via INTRAVENOUS

## 2022-07-01 MED ORDER — DEXTROSE 50 % IV SOLN
12.5000 g | INTRAVENOUS | Status: AC
  Administered 2022-07-01: 12.5 g via INTRAVENOUS
  Filled 2022-07-01: qty 50

## 2022-07-01 MED ORDER — INSULIN GLARGINE-YFGN 100 UNIT/ML ~~LOC~~ SOLN
15.0000 [IU] | Freq: Two times a day (BID) | SUBCUTANEOUS | Status: DC
  Administered 2022-07-01 – 2022-07-02 (×2): 15 [IU] via SUBCUTANEOUS
  Filled 2022-07-01 (×4): qty 0.15

## 2022-07-01 MED ORDER — LACTATED RINGERS IV SOLN: INTRAVENOUS | Status: DC | PRN

## 2022-07-01 MED ORDER — SACUBITRIL-VALSARTAN 49-51 MG PO TABS
1.0000 | ORAL_TABLET | Freq: Two times a day (BID) | ORAL | Status: AC
  Administered 2022-07-01 – 2022-07-03 (×5): 1 via ORAL
  Filled 2022-07-01 (×5): qty 1

## 2022-07-01 SURGICAL SUPPLY — 15 items

## 2022-07-01 NOTE — Progress Notes (Incomplete)
PROGRESS NOTE    [name]  MPN:[id number] DOB: 12-31-1965 DOA: 06/24/2022 PCP: Charolette Forward, MD  56 yo male with the past medical history of coronary artery disease, hypertension, and dyslipidemia who presented with chest pain.BNP 639, Trop 139, CXR with pulmonary vascular congestion, bilateral interstitial infiltrates, small pleural effusions. -2D echo noted EF of 40-45% with wall motion abnormality, severe MR -TEE moderate to severe MR -Left heart cath 9/6 with advanced multivessel CAD -Hospital course complicated by worsening anemia, transfused PRBC 9/11   Subjective: ***  Assessment and Plan:  Acute hypoxic respiratory failure Acute systolic heart failure (HCC) -Echo noted EF 40-45%, with wall motion abnormality, RV function is preserved, severe MR  TEE with moderate to severe mitral regurgitation  -09/06 cardiac catheterization with advance multivessel coronary artery disease.  Severe mitral regurgitation.  -Advanced heart failure team following, diuresed with IV Lasix, currently on milrinone -Remains on Entresto digoxin and Aldactone -Wean O2 as tolerated  Multivessel CAD NSTEMI  -Troponin peaked at 1477 -LHC with severe multivessel CAD and high-grade left main disease -T CTS consulted -Remains on aspirin statin and heparin GTT, beta-blocker on hold  Type 2 diabetes mellitus with hyperlipidemia (HCC) -A1c is 8.2 -Start SGLT2i as tolerated  Iron deficiency anemia -Hemoglobin trended down to 7.1, 2 units of PRBC transfused -Anemia panel with iron deficiency, IV iron on hold -GI consulted, plan for EGD/colonoscopy -Report of melena on admission  EtOH abuse  Hypokalemia Hyponatremia. Hypomagnesemia  GERD (gastroesophageal reflux disease) Continue with pantoprazole   Microcytic anemia Cell count stable   Tobacco abuse Smoking cessation   Left upper lobe pneumonia 09/08 fever 100.4, -Questionable pneumonia on admission imaging -Remains on  antibiotics to complete 5-day course Incentive spirometer and flutter valve.    DVT prophylaxis: IV heparin Code Status: Full code Family Communication: Disposition Plan:   Consultants:    Procedures:   Antimicrobials:    Objective: Vitals:   07/01/22 0353 07/01/22 0610 07/01/22 0744 07/01/22 0917  BP: 129/78  (!) 143/98   Pulse: 93  95 97  Resp: 18  19   Temp: 98.7 F (37.1 C)  98.3 F (36.8 C)   TempSrc: Oral  Oral   SpO2: 98%  99%   Weight:  71.8 kg    Height:        Intake/Output Summary (Last 24 hours) at 07/01/2022 1015 Last data filed at 06/30/2022 1900 Gross per 24 hour  Intake 1821.82 ml  Output 1000 ml  Net 821.82 ml   Filed Weights   06/29/22 0502 06/30/22 0500 07/01/22 0610  Weight: 70.4 kg 67.7 kg 71.8 kg    Examination:  General exam: Appears calm and comfortable  Respiratory system: Clear to auscultation Cardiovascular system: S1 & S2 heard, RRR.  Abd: nondistended, soft and nontender.Normal bowel sounds heard. Central nervous system: Alert and oriented. No focal neurological deficits. Extremities: no edema Skin: No rashes Psychiatry:  Mood & affect appropriate.     Data Reviewed:   CBC: Recent Labs  Lab 06/28/22 0410 06/28/22 2232 06/29/22 0430 06/30/22 0528 06/30/22 1410 07/01/22 0425  WBC 16.2*  --  14.9* 11.4* 8.5 9.2  HGB 7.1* 8.0* 7.9* 7.7* 8.7* 8.7*  HCT 23.3* 26.1* 26.0* 24.6* 28.4* 29.2*  MCV 74.9*  --  76.2* 76.2* 79.1* 79.1*  PLT 330  --  371 411* 416* 008*   Basic Metabolic Panel: Recent Labs  Lab 06/24/22 1143 06/25/22 6761 06/26/22 0752 06/27/22 0430 06/28/22 0410 06/29/22 0430 06/30/22 0528 07/01/22 0425  NA  --    < > 134* 132* 128* 131* 133* 134*  K  --    < > 3.3* 3.8 4.2 4.1 3.7 4.2  CL  --    < > 93* 97* 94* 94* 96* 101  CO2  --    < > '28 26 26 27 27 24  '$ GLUCOSE  --    < > 336* 269* 337* 344* 217* 133*  BUN  --    < > '13 10 10 12 13 10  '$ CREATININE  --    < > 0.92 0.81 1.00 0.85 0.87 0.74   CALCIUM  --    < > 9.2 8.6* 8.6* 9.1 9.2 9.1  MG 1.5*  --  1.3* 1.8  --   --  1.7 2.1   < > = values in this interval not displayed.   GFR: Estimated Creatinine Clearance: 106 mL/min (by C-G formula based on SCr of 0.74 mg/dL). Liver Function Tests: Recent Labs  Lab 07/01/22 0425  AST 335*  ALT 231*  ALKPHOS 123  BILITOT 0.7  PROT 7.1  ALBUMIN 2.3*   No results for input(s): "LIPASE", "AMYLASE" in the last 168 hours. No results for input(s): "AMMONIA" in the last 168 hours. Coagulation Profile: No results for input(s): "INR", "PROTIME" in the last 168 hours. Cardiac Enzymes: No results for input(s): "CKTOTAL", "CKMB", "CKMBINDEX", "TROPONINI" in the last 168 hours. BNP (last 3 results) No results for input(s): "PROBNP" in the last 8760 hours. HbA1C: No results for input(s): "HGBA1C" in the last 72 hours. CBG: Recent Labs  Lab 06/30/22 1650 06/30/22 2105 06/30/22 2246 07/01/22 0616 07/01/22 0800  GLUCAP 160* 203* 158* 131* 123*   Lipid Profile: No results for input(s): "CHOL", "HDL", "LDLCALC", "TRIG", "CHOLHDL", "LDLDIRECT" in the last 72 hours. Thyroid Function Tests: No results for input(s): "TSH", "T4TOTAL", "FREET4", "T3FREE", "THYROIDAB" in the last 72 hours. Anemia Panel: No results for input(s): "VITAMINB12", "FOLATE", "FERRITIN", "TIBC", "IRON", "RETICCTPCT" in the last 72 hours. Urine analysis:    Component Value Date/Time   COLORURINE YELLOW 06/26/2022 2116   APPEARANCEUR CLEAR 06/26/2022 2116   LABSPEC 1.023 06/26/2022 2116   PHURINE 5.0 06/26/2022 2116   GLUCOSEU >=500 (A) 06/26/2022 2116   HGBUR NEGATIVE 06/26/2022 2116   BILIRUBINUR NEGATIVE 06/26/2022 2116   Harris NEGATIVE 06/26/2022 2116   PROTEINUR NEGATIVE 06/26/2022 2116   UROBILINOGEN 1.0 01/30/2015 1029   NITRITE NEGATIVE 06/26/2022 2116   LEUKOCYTESUR NEGATIVE 06/26/2022 2116   Sepsis Labs: '@LABRCNTIP'$ (procalcitonin:4,lacticidven:4)  ) Recent Results (from the past 240 hour(s))   Urine Culture     Status: None   Collection Time: 06/26/22  9:16 PM   Specimen: Urine, Clean Catch  Result Value Ref Range Status   Specimen Description URINE, CLEAN CATCH  Final   Special Requests NONE  Final   Culture   Final    NO GROWTH Performed at [facility], [place] [address], [place], [place] [address]    Report Status 06/29/2022 FINAL  Final  Culture, blood (Routine X 2) w Reflex to ID Panel     Status: None   Collection Time: 06/26/22 10:24 PM   Specimen: BLOOD  Result Value Ref Range Status   Specimen Description BLOOD RIGHT ANTECUBITAL  Final   Special Requests   Final    BOTTLES DRAWN AEROBIC AND ANAEROBIC Blood Culture adequate volume   Culture   Final    NO GROWTH 5 DAYS Performed at [facility], [place]  [address], [place], [place] [address]    Report Status 07/01/2022 FINAL  Final  Culture, blood (Routine X 2) w Reflex to ID Panel     Status: None   Collection Time: 06/26/22 10:41 PM   Specimen: BLOOD RIGHT HAND  Result Value Ref Range Status   Specimen Description BLOOD RIGHT HAND  Final   Special Requests   Final    BOTTLES DRAWN AEROBIC AND ANAEROBIC Blood Culture adequate volume   Culture   Final    NO GROWTH 5 DAYS Performed at [facility], [place] [address] [place], [place] [address]    Report Status 07/01/2022 FINAL  Final  Respiratory (~20 pathogens) panel by PCR     Status: None   Collection Time: 06/27/22  7:28 AM   Specimen: Nasopharyngeal Swab; Respiratory  Result Value Ref Range Status   Adenovirus NOT DETECTED NOT DETECTED Final   Coronavirus 229E NOT DETECTED NOT DETECTED Final    Comment: (NOTE) The Coronavirus on the Respiratory Panel, DOES NOT test for the novel  Coronavirus (2019 nCoV)    Coronavirus HKU1 NOT DETECTED NOT DETECTED Final   Coronavirus NL63 NOT DETECTED NOT DETECTED Final   Coronavirus OC43 NOT DETECTED NOT DETECTED Final   Metapneumovirus NOT DETECTED NOT DETECTED Final   Rhinovirus / Enterovirus NOT  DETECTED NOT DETECTED Final   Influenza A NOT DETECTED NOT DETECTED Final   Influenza B NOT DETECTED NOT DETECTED Final   Parainfluenza Virus 1 NOT DETECTED NOT DETECTED Final   Parainfluenza Virus 2 NOT DETECTED NOT DETECTED Final   Parainfluenza Virus 3 NOT DETECTED NOT DETECTED Final   Parainfluenza Virus 4 NOT DETECTED NOT DETECTED Final   Respiratory Syncytial Virus NOT DETECTED NOT DETECTED Final   Bordetella pertussis NOT DETECTED NOT DETECTED Final   Bordetella Parapertussis NOT DETECTED NOT DETECTED Final   Chlamydophila pneumoniae NOT DETECTED NOT DETECTED Final   Mycoplasma pneumoniae NOT DETECTED NOT DETECTED Final    Comment: Performed at [facility], [place]. [address], [place], [place] [address]  SARS Coronavirus 2 by RT PCR (hospital order, performed in [facility] hospital lab) *cepheid single result test* Anterior Nasal Swab     Status: None   Collection Time: 06/27/22  7:28 AM   Specimen: Anterior Nasal Swab  Result Value Ref Range Status   SARS Coronavirus 2 by RT PCR NEGATIVE NEGATIVE Final    Comment: (NOTE) SARS-CoV-2 target nucleic acids are NOT DETECTED.  The SARS-CoV-2 RNA is generally detectable in upper and lower respiratory specimens during the acute phase of infection. The lowest concentration of SARS-CoV-2 viral copies this assay can detect is 250 copies / mL. A negative result does not preclude SARS-CoV-2 infection and should not be used as the sole basis for treatment or other patient management decisions.  A negative result may occur with improper specimen collection / handling, submission of specimen other than nasopharyngeal swab, presence of viral mutation(s) within the areas targeted by this assay, and inadequate number of viral copies (<250 copies / mL). A negative result must be combined with clinical observations, patient history, and epidemiological information.  Fact Sheet for Patients:   https://www.patel.info/  Fact  Sheet for Healthcare Providers: https://hall.com/  This test is not yet approved or  cleared by the [place] FDA and has been authorized for detection and/or diagnosis of SARS-CoV-2 by FDA under an Emergency Use Authorization (EUA).  This EUA will remain in effect (meaning this test can be used) for the duration  of the COVID-19 declaration under Section 564(b)(1) of the Act, 21 U.S.C. section 360bbb-3(b)(1), unless the authorization is terminated or revoked sooner.  Performed at [facility], [place] [address], [place], [place] [address]      Radiology Studies: VAS US DOPPLER PRE CABG  Result Date: 07/01/2022 PREOPERATIVE VASCULAR EVALUATION Patient Name:  LLEWELYN SHEAFFER  Date of Exam:   07/01/2022 Medical Rec #: [id number]             Accession #:    [id number] Date of Birth: 08/09/1966              Patient Gender: M Patient Age:   73 years Exam Location:  [facility] Procedure:      VAS US DOPPLER PRE CABG Referring Phys: Coralie Common --------------------------------------------------------------------------------  Indications:      Pre-CABG. Risk Factors:     Hypertension, hyperlipidemia, prior MI, coronary artery                   disease. Comparison Study: no prior Performing Technologist: Archie Patten RVS  Examination Guidelines: A complete evaluation includes B-mode imaging, spectral Doppler, color Doppler, and power Doppler as needed of all accessible portions of each vessel. Bilateral testing is considered an integral part of a complete examination. Limited examinations for reoccurring indications may be performed as noted.  Right Carotid Findings: +----------+--------+--------+--------+------------+--------+           PSV cm/sEDV cm/sStenosisDescribe    Comments +----------+--------+--------+--------+------------+--------+ CCA Prox  81      16              heterogenous          +----------+--------+--------+--------+------------+--------+ CCA Distal63      14              heterogenous         +----------+--------+--------+--------+------------+--------+ ICA Prox  58      18      1-39%   heterogenous         +----------+--------+--------+--------+------------+--------+ ICA Distal72      25                                   +----------+--------+--------+--------+------------+--------+ ECA       63                                           +----------+--------+--------+--------+------------+--------+ +----------+--------+-------+--------+------------+           PSV cm/sEDV cmsDescribeArm Pressure +----------+--------+-------+--------+------------+ [id number]                                  +----------+--------+-------+--------+------------+ +---------+--------+--+--------+--+---------+ VertebralPSV cm/s31EDV cm/s11Antegrade +---------+--------+--+--------+--+---------+ Left Carotid Findings: +----------+--------+--------+--------+------------+--------+           PSV cm/sEDV cm/sStenosisDescribe    Comments +----------+--------+--------+--------+------------+--------+ CCA Prox  109     18              heterogenous         +----------+--------+--------+--------+------------+--------+ CCA Distal91      15              heterogenous         +----------+--------+--------+--------+------------+--------+ ICA Prox  57      19      1-39%  heterogenous         +----------+--------+--------+--------+------------+--------+ ICA Distal70      20                                   +----------+--------+--------+--------+------------+--------+ ECA       75      9                                    +----------+--------+--------+--------+------------+--------+ +----------+--------+--------+--------+------------+ SubclavianPSV cm/sEDV cm/sDescribeArm Pressure +----------+--------+--------+--------+------------+            103                                  +----------+--------+--------+--------+------------+ +---------+--------+--+--------+--+---------+ VertebralPSV cm/s45EDV cm/s12Antegrade +---------+--------+--+--------+--+---------+  ABI Findings: +--------+------------------+-----+---------+--------+ Right   Rt Pressure (mmHg)IndexWaveform Comment  +--------+------------------+-----+---------+--------+ Brachial                       triphasic         +--------+------------------+-----+---------+--------+ +--------+------------------+-----+---------+-------+ Left    Lt Pressure (mmHg)IndexWaveform Comment +--------+------------------+-----+---------+-------+ Brachial                       triphasic        +--------+------------------+-----+---------+-------+  Right Doppler Findings: +--------+--------+-----+---------+--------+ Site    PressureIndexDoppler  Comments +--------+--------+-----+---------+--------+ Brachial             triphasic         +--------+--------+-----+---------+--------+ Radial               triphasic         +--------+--------+-----+---------+--------+ Ulnar                triphasic         +--------+--------+-----+---------+--------+  Left Doppler Findings: +--------+--------+-----+---------+--------+ Site    PressureIndexDoppler  Comments +--------+--------+-----+---------+--------+ Brachial             triphasic         +--------+--------+-----+---------+--------+ Radial               triphasic         +--------+--------+-----+---------+--------+ Ulnar                triphasic         +--------+--------+-----+---------+--------+   Summary: Right Carotid: Velocities in the right ICA are consistent with a 1-39% stenosis. Left Carotid: Velocities in the left ICA are consistent with a 1-39% stenosis. Vertebrals: Bilateral vertebral arteries demonstrate antegrade flow. Right Upper Extremity: Doppler waveforms decrease  50% with right radial compression. Doppler waveforms remain within normal limits with right ulnar compression. Left Upper Extremity: Doppler waveforms remain within normal limits with left radial compression. Doppler waveforms remain within normal limits with left ulnar compression.     Preliminary    DG Chest 1 View  Result Date: 06/29/2022 CLINICAL DATA:  Difficulty breathing EXAM: CHEST  1 VIEW COMPARISON:  Previous studies including the chest radiograph done on 06/26/2022 and CT done on 06/27/2022 FINDINGS: Transverse diameter of heart is slightly increased. There is interval improvement in the aeration in both lungs suggesting resolving pulmonary edema. There is residual prominence of interstitial markings in right parahilar region and left lower lung field. No new focal infiltrates are seen. There is no pleural effusion or pneumothorax. Tip of right IJ  central venous catheter is seen in superior vena cava. IMPRESSION: There is significant interval decrease in alveolar densities in both lungs, more so on the left side suggesting resolving pulmonary edema or resolving bilateral pneumonia. There is residual prominence of markings in the right parahilar region and left lower lung field suggesting residual interstitial edema or interstitial pneumonia. No new focal infiltrates are seen. Electronically Signed   By: Elmer Picker M.D.   On: 06/29/2022 16:11     Scheduled Meds:  aspirin EC  81 mg Oral Daily   bisacodyl  10 mg Oral Q6H   Chlorhexidine Gluconate Cloth  6 each Topical Daily   digoxin  0.125 mg Oral Daily   feeding supplement  237 mL Oral BID BM   heparin injection (subcutaneous)  5,000 Units Subcutaneous Q8H   insulin aspart  0-15 Units Subcutaneous TID WC   insulin aspart  0-5 Units Subcutaneous QHS   insulin aspart  5 Units Subcutaneous TID WC   insulin glargine-yfgn  25 Units Subcutaneous BID   pantoprazole  40 mg Oral Q0600   rosuvastatin  40 mg Oral Daily    sacubitril-valsartan  1 tablet Oral BID   sodium chloride flush  3 mL Intravenous Q12H   spironolactone  25 mg Oral Daily   Continuous Infusions:  sodium chloride     cefTRIAXone (ROCEPHIN)  IV 2 g (06/30/22 1556)   milrinone 0.125 mcg/kg/min (07/01/22 0418)     LOS: 7 days    Time spent: 31mn    PDomenic Polite MD [facility]   07/01/2022, 10:15 AM

## 2022-07-01 NOTE — Progress Notes (Signed)
Mobility Specialist Criteria Algorithm Info.   07/01/22 1248  Mobility  Activity Off unit   Patient at procedure, will f/u as time permits.   [place] Millee Denise, [place], [place]  AQWBE:[phone] Office: [phone]

## 2022-07-01 NOTE — Anesthesia Postprocedure Evaluation (Signed)
Anesthesia Post Note  Patient: [name]  Procedure(s) Performed: ESOPHAGOGASTRODUODENOSCOPY (EGD) WITH PROPOFOL COLONOSCOPY BIOPSY HEMOSTASIS CLIP PLACEMENT POLYPECTOMY     Patient location during evaluation: PACU Anesthesia Type: MAC Level of consciousness: awake and alert Pain management: pain level controlled Vital Signs Assessment: post-procedure vital signs reviewed and stable Respiratory status: spontaneous breathing, nonlabored ventilation, respiratory function stable and patient connected to nasal cannula oxygen Cardiovascular status: stable and blood pressure returned to baseline Postop Assessment: no apparent nausea or vomiting Anesthetic complications: no   No notable events documented.  Last Vitals:  Vitals:   07/01/22 1445 07/01/22 1551  BP: 134/88 (!) 142/90  Pulse: 72   Resp:  (!) 21  Temp:  36.6 C  SpO2: 94%     Last Pain:  Vitals:   07/01/22 1551  TempSrc: Oral  PainSc:                  Effie Berkshire

## 2022-07-01 NOTE — Anesthesia Preprocedure Evaluation (Addendum)
Anesthesia Evaluation  Patient identified by MRN, date of birth, ID band Patient awake    Reviewed: Allergy & Precautions, NPO status , Patient's Chart, lab work & pertinent test results  Airway Mallampati: I  TM Distance: >3 FB Neck ROM: Full    Dental  (+) Teeth Intact, Dental Advisory Given   Pulmonary sleep apnea , Current Smoker,    breath sounds clear to auscultation       Cardiovascular hypertension, Pt. on medications + CAD, + Cardiac Stents and +CHF   Rhythm:Regular Rate:Normal     Neuro/Psych negative neurological ROS  negative psych ROS   GI/Hepatic Neg liver ROS, GERD  ,  Endo/Other  diabetes, Type 2, Oral Hypoglycemic Agents  Renal/GU negative Renal ROS     Musculoskeletal negative musculoskeletal ROS (+)   Abdominal Normal abdominal exam  (+)   Peds  Hematology negative hematology ROS (+)   Anesthesia Other Findings   Reproductive/Obstetrics                            Anesthesia Physical Anesthesia Plan  ASA: 3  Anesthesia Plan: MAC   Post-op Pain Management:    Induction: Intravenous  PONV Risk Score and Plan: 0 and Propofol infusion  Airway Management Planned: Natural Airway and Simple Face Mask  Additional Equipment: None  Intra-op Plan:   Post-operative Plan:   Informed Consent: I have reviewed the patients History and Physical, chart, labs and discussed the procedure including the risks, benefits and alternatives for the proposed anesthesia with the patient or authorized representative who has indicated his/her understanding and acceptance.     Dental advisory given  Plan Discussed with: CRNA  Anesthesia Plan Comments: (Lab Results      Component                Value               Date                      WBC                      9.2                 07/01/2022                HGB                      8.7 (L)             07/01/2022                HCT                       29.2 (L)            07/01/2022                MCV                      79.1 (L)            07/01/2022                PLT                      472 (H)  07/01/2022           )       Anesthesia Quick Evaluation

## 2022-07-01 NOTE — Op Note (Signed)
[facility] Patient Name: [name] Procedure Date : 07/01/2022 MRN: [id number] Attending MD: Gerrit Heck , MD Date of Birth: 01/30/1966 CSN: [id number] Age: 56 Admit Type: Inpatient Procedure:                Upper GI endoscopy Indications:              Iron deficiency anemia                           56 year old male with history of severe mitral                            regurgitation, multivessel CAD with prior MI in                            2012 requiring PCI, diabetes, CHF with EF 40-45%,                            HTN, HLD, GERD, tobacco use disorder, EtOH                            pancreatitis 2014, admitted with chest pain,                            NSTEMI, acute on chronic systolic CHF/ischemic                            cardiomyopathy. Currently undergoing evaluation for                            CABG/MVR. Was noted to have downtrending hemoglobin                            without overt bleeding and FOBT negative stool.                           Comparison labs from 12/2020:                           ?" H/H 12.9/37.3, MCV/RDW 96/12                           06/24/2022 (date of admission):                           ?" H/H 8.2/20.5, MCV/RDW 77/21                           ?" PLT 364                           ?" BUN/creatinine 17/0.87                           ?" Ferritin 18, iron 21, TIBC 549, sat 4%                           ?"  B12 468, folate 7                           Transfused 2 units PRBCs on this admission with                            stable H/H today at 8.7/29.2. No overt bleeding                            with bowel prep overnight. Providers:                Gerrit Heck, MD, Carmie End, RN, Darliss Cheney, Technician Referring MD:              Medicines:                Monitored Anesthesia Care Complications:            No immediate complications. Estimated Blood Loss:     Estimated blood loss was  minimal. Procedure:                Pre-Anesthesia Assessment:                           - Prior to the procedure, a History and Physical                            was performed, and patient medications and                            allergies were reviewed. The patient's tolerance of                            previous anesthesia was also reviewed. The risks                            and benefits of the procedure and the sedation                            options and risks were discussed with the patient.                            All questions were answered, and informed consent                            was obtained. Prior Anticoagulants: The patient has                            taken no previous anticoagulant or antiplatelet                            agents. ASA Grade Assessment: III - A patient with  severe systemic disease. After reviewing the risks                            and benefits, the patient was deemed in                            satisfactory condition to undergo the procedure.                           After obtaining informed consent, the endoscope was                            passed under direct vision. Throughout the                            procedure, the patient's blood pressure, pulse, and                            oxygen saturations were monitored continuously. The                            [id number]-H190 ([id number]) Olympus endoscope was introduced                            through the mouth, and advanced to the third part                            of duodenum. The upper GI endoscopy was                            accomplished without difficulty. The patient                            tolerated the procedure well. Scope In: Scope Out: Findings:      A non-obstructing Schatzki ring was found in the lower third of the       esophagus.      A 4 cm hiatal hernia was present.      The entire examined stomach was normal. Biopsies  were taken with a cold       forceps for Helicobacter pylori testing. There was mild, persistent       oozing at one of the gastric body biopsy sites. Given likely need for       systemic anticoagulation and anti-platelet therapy, elected for       mechanical hemostasis. For hemostasis, one hemostatic clip was       successfully placed (MR conditional) with cessation of bleeding. There       was no bleeding at the end of the procedure. Estimated blood loss was       minimal.      The examined duodenum was normal. Biopsies were taken with a cold       forceps for histology. Estimated blood loss was minimal. Impression:               - Non-obstructing Schatzki ring.                           -  4 cm hiatal hernia.                           - Normal stomach. Biopsied. One hemostatic clip                            placed for hemostasis of one of the biopsy sites as                            above.                           - Normal examined duodenum. Biopsied.                           - No active bleeding or sitgmata of bleeding noted                            on this study. Recommendation:           - Await pathology results.                           - Perform a colonoscopy today.                           - See colonoscopy report for additional details                            regarding ongoing inpatient GI management. Procedure Code(s):        --- Professional ---                           [phone], Esophagogastroduodenoscopy, flexible,                            transoral; with biopsy, single or multiple Diagnosis Code(s):        --- Professional ---                           K22.2, Esophageal obstruction                           K44.9, Diaphragmatic hernia without obstruction or                            gangrene                           D50.9, Iron deficiency anemia, unspecified CPT copyright 2019 [employer]. All rights reserved. The codes documented in this  report are preliminary and upon coder review may  be revised to meet current compliance requirements. Gerrit Heck, MD 07/01/2022 2:19:28 PM Number of Addenda: 0

## 2022-07-01 NOTE — Anesthesia Procedure Notes (Signed)
Procedure Name: MAC Date/Time: 07/01/2022 1:22 PM  Performed by: Kyung Rudd, CRNAPre-anesthesia Checklist: Patient identified, Emergency Drugs available, Suction available and Patient being monitored Patient Re-evaluated:Patient Re-evaluated prior to induction Oxygen Delivery Method: Nasal cannula Induction Type: IV induction Placement Confirmation: positive ETCO2 Dental Injury: Teeth and Oropharynx as per pre-operative assessment

## 2022-07-01 NOTE — Progress Notes (Signed)
Pre cabg has been completed.   Preliminary results in CV Proc.   Jinny Blossom Yigit Norkus 07/01/2022 9:55 AM

## 2022-07-01 NOTE — Progress Notes (Addendum)
Patient ID: [name], male   DOB: [birth year], 56 y.o.   MRN: [id number]   Advanced Heart Failure Rounding Note  PCP-Cardiologist: None   Subjective:    09/06: Started milrinone 0.125 to facilitate diuresis in setting of low-output 09/07: Milrinone increased to 0.25. 09/09: Milrinone decreased to 0.125  09/11: 1uPRBc   CVP  <5, drank bowel prep yesterday. NPO for EGD/colonoscopy. Denies CP or SOB. No obvious bleeding Co-ox 65%   Objective:   Weight Range: 71.8 kg Body mass index is 21.47 kg/m.   Vital Signs:   Temp:  [97.9 F (36.6 C)-98.8 F (37.1 C)] 98.7 F (37.1 C) (09/12 0353) Pulse Rate:  [91-115] 93 (09/12 0353) Resp:  [17-24] 18 (09/12 0353) BP: (109-135)/(77-88) 129/78 (09/12 0353) SpO2:  [97 %-100 %] 98 % (09/12 0353) Weight:  [71.8 kg] 71.8 kg (09/12 0610) Last BM Date : 06/25/22  Weight change: Filed Weights   06/29/22 0502 06/30/22 0500 07/01/22 0610  Weight: 70.4 kg 67.7 kg 71.8 kg    Intake/Output:   Intake/Output Summary (Last 24 hours) at 07/01/2022 0732 Last data filed at 06/30/2022 1900 Gross per 24 hour  Intake 2061.82 ml  Output 1000 ml  Net 1061.82 ml     Physical Exam   General:  well appearing. Looks stated age. No respiratory difficulty HEENT: normal Neck: supple. No JVD. Carotids 2+ bilat; no bruits. No lymphadenopathy or thyromegaly appreciated. Cor: PMI nondisplaced. Regular rate & rhythm. No rubs, gallops. 2/6 SEM at apex. Lungs: clear Abdomen: soft, nontender, nondistended. No hepatosplenomegaly. No bruits or masses. Good bowel sounds. Extremities: no cyanosis, clubbing, rash, edema  Neuro: alert & oriented x 3, cranial nerves grossly intact. moves all 4 extremities w/o difficulty. Affect pleasant.   Telemetry   ST low 100s (personally reviewed).   Labs    CBC Recent Labs    06/30/22 1410 07/01/22 0425  WBC 8.5 9.2  HGB 8.7* 8.7*  HCT 28.4* 29.2*  MCV 79.1* 79.1*  PLT 416* 027*   Basic Metabolic  Panel Recent Labs    06/30/22 0528 07/01/22 0425  NA 133* 134*  K 3.7 4.2  CL 96* 101  CO2 27 24  GLUCOSE 217* 133*  BUN 13 10  CREATININE 0.87 0.74  CALCIUM 9.2 9.1  MG 1.7 2.1   Liver Function Tests Recent Labs    07/01/22 0425  AST 335*  ALT 231*  ALKPHOS 123  BILITOT 0.7  PROT 7.1  ALBUMIN 2.3*   No results for input(s): "LIPASE", "AMYLASE" in the last 72 hours. Cardiac Enzymes No results for input(s): "CKTOTAL", "CKMB", "CKMBINDEX", "TROPONINI" in the last 72 hours.  BNP: BNP (last 3 results) Recent Labs    06/24/22 0809 06/25/22 0133  BNP 639.0* 830.5*    ProBNP (last 3 results) No results for input(s): "PROBNP" in the last 8760 hours.   D-Dimer No results for input(s): "DDIMER" in the last 72 hours. Hemoglobin A1C No results for input(s): "HGBA1C" in the last 72 hours.  Fasting Lipid Panel No results for input(s): "CHOL", "HDL", "LDLCALC", "TRIG", "CHOLHDL", "LDLDIRECT" in the last 72 hours.  Thyroid Function Tests No results for input(s): "TSH", "T4TOTAL", "T3FREE", "THYROIDAB" in the last 72 hours.  Invalid input(s): "FREET3"  Other results:   Imaging    No results found.   Medications:     Scheduled Medications:  aspirin EC  81 mg Oral Daily   azithromycin  500 mg Oral Daily   bisacodyl  10 mg Oral Q6H  Chlorhexidine Gluconate Cloth  6 each Topical Daily   digoxin  0.125 mg Oral Daily   feeding supplement  237 mL Oral BID BM   heparin injection (subcutaneous)  5,000 Units Subcutaneous Q8H   insulin aspart  0-15 Units Subcutaneous TID WC   insulin aspart  0-5 Units Subcutaneous QHS   insulin aspart  5 Units Subcutaneous TID WC   insulin glargine-yfgn  25 Units Subcutaneous BID   pantoprazole  40 mg Oral Q0600   rosuvastatin  40 mg Oral Daily   sacubitril-valsartan  1 tablet Oral BID   sodium chloride flush  3 mL Intravenous Q12H   spironolactone  25 mg Oral Daily    Infusions:  sodium chloride     cefTRIAXone  (ROCEPHIN)  IV 2 g (06/30/22 1556)   milrinone 0.125 mcg/kg/min (07/01/22 0418)    PRN Medications: sodium chloride, acetaminophen, guaiFENesin-dextromethorphan, LORazepam, ondansetron (ZOFRAN) IV, oxyCODONE, sodium chloride flush    Patient Profile   56 y/o AAM w/ h/o multivessel CAD s/p remote MI in 2012 and multiple PCIs to LAD, diag, ramus and OM vessels, HTN, HLD, Type 2 DM and tobacco use, admitted for NSTEMI and acute CHF. 2D Echo and TEE w/ severe mitral regurgitation. LVEF 40-45%, RV ok. Cath w/ multivessel CAD including high grade LM disease, elevated filling pressures and preserved CO.    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  - cMRI today. TCTS consulted  2.  Acute on chronic systolic CHF/Ischemic cardiomyopathy - Echo with EF 40-45%.  - RHC w/ RA 8, PA 48/23 (38), PCWP mean 26, LVEDP 21,  PA sat 49% - Today, co-ox 65% CVP <5    - Continue digoxin 0.125.  - Keep milrinone at 0.125 to maintain hemodynamic stability while making decisions on forward path.  - No diuretic today.   - Increase Entresto to 49/51 bid.   - Continue spironolactone 25 mg daily.  - No beta blocker for now with low-output - Consider low dose torsemide tomorrow   3. NSTEMI w/ Multivessel CAD  - Known hx prior CAD with MI in 2012 and prior multivessel PCI - HS trop peak 1,477 - LHC w/ severe MVCAD including high grade LM disease  - ASA + high intensity statin, heparin gtt - Think best path forwards would be CABG-MVR.   4. Type 2DM  - per primary team  - A1c 8.2%  - Eventual SGLT2i - Will need good control of blood sugars if goes for CABG   5. IDA - Hgb 8.4---> 7.4 --> 7.1 -> 1 unit PRBCs -> 7.9 today -> 8.7 today - no obvious source blood loss, FOBT negative.  - Iron  21, sats 4%. No feraheme yet, give if  opt not to do MRI.  - GI consulted: plan for expedited EGD/colonoscopy to r/o high risk lesion prior to surgery, drank bowel prep last night  6. ETOH and tobacco abuse - Watch for ETOH withdrawal  7. ID - CT chest with pulmonary edema  - Cough improved with diuresis.  - He is now on azithromycin. - Suspect CHF was cause of cough etc.   Length of Stay: [address] AGACNP-BC  07/01/2022, 7:32 AM  Advanced Heart Failure Team Pager [phone] (M-F; 7a - 5p)  Please contact [facility] for night-coverage after hours (  5p -7a ) and weekends on [email or web address]  Patient seen and examined with the above-signed Advanced Practice Provider and/or Housestaff. I personally reviewed laboratory data, imaging studies and relevant notes. I independently examined the patient and formulated the important aspects of the plan. I have edited the note to reflect any of my changes or salient points. I have personally discussed the plan with the patient and/or family.  Breathing much better. CVP low. CXR improved. Remains on milrinone. Co-ox 65% Has been seen by GI. Pending EGD and colon today.   Denies CP or SOB. Has been seen by TCTS. Pending results of cMRI.   General:  Well appearing. No resp difficulty HEENT: normal Neck: supple. no JVD. Carotids 2+ bilat; no bruits. No lymphadenopathy or thryomegaly appreciated. Cor: PMI nondisplaced. Regular rate & rhythm. No rubs, gallops or murmurs. Lungs: clear Abdomen: soft, nontender, nondistended. No hepatosplenomegaly. No bruits or masses. Good bowel sounds. Extremities: no cyanosis, clubbing, rash, edema Neuro: alert & orientedx3, cranial nerves grossly intact. moves all 4 extremities w/o difficulty. Affect pleasant  For EGD/colon today. Continue milrinone. cMRI in the am. Will f/u with TCTS to determine surgical plan.   Glori Bickers, MD  1:01 PM

## 2022-07-01 NOTE — Transfer of Care (Signed)
Immediate Anesthesia Transfer of Care Note  Patient: [name]  Procedure(s) Performed: ESOPHAGOGASTRODUODENOSCOPY (EGD) WITH PROPOFOL COLONOSCOPY BIOPSY HEMOSTASIS CLIP PLACEMENT POLYPECTOMY  Patient Location: PACU  Anesthesia Type:MAC  Level of Consciousness: awake, alert  and oriented  Airway & Oxygen Therapy: Patient Spontanous Breathing and Patient connected to nasal cannula oxygen  Post-op Assessment: Report given to RN, Post -op Vital signs reviewed and stable and Patient moving all extremities X 4  Post vital signs: Reviewed and stable  Last Vitals:  Vitals Value Taken Time  BP    Temp    Pulse 88 07/01/22 1422  Resp 27 07/01/22 1422  SpO2 97 % 07/01/22 1422  Vitals shown include unvalidated device data.  Last Pain:  Vitals:   07/01/22 1248  TempSrc: Temporal  PainSc: 0-No pain         Complications: No notable events documented.

## 2022-07-01 NOTE — Plan of Care (Signed)
  Problem: Education: Goal: Knowledge of General Education information will improve Description: Including pain rating scale, medication(s)/side effects and non-pharmacologic comfort measures Outcome: Progressing   Problem: Clinical Measurements: Goal: Ability to maintain clinical measurements within normal limits will improve Outcome: Progressing Goal: Will remain free from infection Outcome: Progressing Goal: Cardiovascular complication will be avoided Outcome: Progressing   Problem: Activity: Goal: Risk for activity intolerance will decrease Outcome: Progressing   Problem: Nutrition: Goal: Adequate nutrition will be maintained Outcome: Progressing   Problem: Coping: Goal: Level of anxiety will decrease Outcome: Progressing   

## 2022-07-01 NOTE — Progress Notes (Addendum)
CBG was 67 at 100. 20m of dextrose given d/t NPO status. Follow-up cbg was 89. Patient is asymptomatic, resting in chair. MD Arrien aware.

## 2022-07-01 NOTE — Progress Notes (Addendum)
CARDIAC REHAB PHASE I   PRE:  Rate/Rhythm: 103/ST    BP: sitting 129/90    MODE:  Ambulation: 340 ft    POST:  Rate/Rhythm: 105/ST    BP: sitting 141/91  Patient ambulated 340 ft with stand by assist, tolerated well, denies CP, SOB, or dizziness. Pre-op CABG education completed, pt educated on IS (1250 max pre-op), sternal precautions, "move in a tube", restrictions, pain management, nutrition, infection prevention, open heart surgery booklet and handout given to pt; home needs for discharged discussed. Will continue to follow. [place] RN 07/01/2022 11:34 AM

## 2022-07-01 NOTE — Inpatient Diabetes Management (Signed)
Inpatient Diabetes Program Recommendations  AACE/ADA: New Consensus Statement on Inpatient Glycemic Control (2015)  Target Ranges:  Prepandial:   less than 140 mg/dL      Peak postprandial:   less than 180 mg/dL (1-2 hours)      Critically ill patients:  140 - 180 mg/dL   Lab Results  Component Value Date   GLUCAP 89 07/01/2022   HGBA1C 8.2 (H) 06/24/2022    Review of Glycemic Control  Latest Reference Range & Units 07/01/22 08:00 07/01/22 10:48 07/01/22 11:18  Glucose-Capillary 70 - 99 mg/dL 123 (H) 67 (L) 89  (H): Data is abnormally high (L): Data is abnormally low Diabetes history: Type 2 DM Outpatient Diabetes medications: Lantus 50 units QHS, Jardiance 12.5 mg QD, Metformin 500 mg BID, Glipizide 5 mg QD Current orders for Inpatient glycemic control: Semglee 15 units BID, Novolog 5 units TID, Novolog 0-15 units TID & HS   Inpatient Diabetes Program Recommendations:     Noted hypoglycemia this AM. Of note, patient NPO and received meal coverage. Basal insulin also adjusted. Follow.   Thanks, Bronson Curb, MSN, RNC-OB Diabetes Coordinator [phone] (8a-5p)

## 2022-07-01 NOTE — Interval H&P Note (Signed)
History and Physical Interval Note:  07/01/2022 1:11 PM  [name]  has presented today for surgery, with the diagnosis of iron deficiency anemia.  The various methods of treatment have been discussed with the patient and family. After consideration of risks, benefits and other options for treatment, the patient has consented to  Procedure(s): ESOPHAGOGASTRODUODENOSCOPY (EGD) WITH PROPOFOL (N/A) COLONOSCOPY (N/A) as a surgical intervention.  The patient's history has been reviewed, patient examined, no change in status, stable for surgery.  I have reviewed the patient's chart and labs.  Questions were answered to the patient's satisfaction.     [name]

## 2022-07-01 NOTE — Progress Notes (Signed)
Patient on clear liquid diet at 0700 per orders. Meal coverage insulin given. Patient was supposed to be NPO per heart failure NP Ferrel Logan. Patient now NPO and current cbg is 123.

## 2022-07-01 NOTE — Progress Notes (Addendum)
Progress Note   Patient: Torrey Ballinas ZOX:[id number] DOB: [birth year] DOA: 06/24/2022     7 DOS: the patient was seen and examined on 07/01/2022   Brief hospital course: Mr. Mosco was admitted to the hospital with the working diagnosis of decompensated heart failure complicated with NSTEMI.  New diagnosed severe diffuse coronary artery disease, and mitral valve regurgitation.  Plan for possible CABG during this hospitalization   56 yo male with the past medical history of coronary artery disease, hypertension, and dyslipidemia who presented with chest pain. Intermittent chest pain, left sided pressure and radiated to the right. Because persistent symptoms he came to the ED. On his initial physical evaluation his blood pressure was 111/80, HR 89, RR 26 and 02 saturation 93%, lungs with rales but not wheezing, heart with S1 and S2 present and rhythmic, abdomen with no distention and positive lower extremity edema   Na 137, K 3,4 Cl 99, bicarbonate 23, glucose 295 bun 17 cr 0,87  BNP 639 High sensitive troponin 139  Wbc 10,9 hgb 8,2 plt 364   Chest radiograph with bilateral hilar vascular congestion, bilateral interstitial infiltrates more at the lower lobes, cephalization of the vasculature and small bilateral pleural effusions.   EKG 116 bpm, normal axis and normal intervals, sinus rhythm with V5 and V6 ST depression with no significant T wave changes, poor R R wave progression.   Echocardiogram with reduced LV systolic function, positive Mitral regurgitation and severe multivessel CAD and IDA   Patient has been placed on inotropic support and diuresis with furosemide  09/07 positive fever.  09/08 patient started on antibiotic therapy for pneumonia, (present on admission).  09/09 one unit PRBC transfusion with good toleration.  09/11 worsening hgb, getting the second PRBC transfusion 09/12  GI consulted and patient will have EGD and colonoscopy for possible occult GI bleeding.  Plan  for possible CABG on this hospitalization.   Assessment and Plan: Acute systolic heart failure (HCC) Echocardiogram with left ventricle EF 40 to 45%, mild dilated internal cavity, moderate hypokinesis of the left ventricular, basal mid inferolateral wall and anterolateral wall. RV systolic function is preserved. LA with severe dilatation. Severe mitral regurgitation   TEE with moderate to severe mitral regurgitation   09/06 cardiac catheterization with advance multivessel coronary artery disease.  Severe mitral regurgitation.  PCWP 26 Cardiac output 5,4 per [place].  Urine output is 1000 cc over last 24 hrs Blood pressure systolic 811 to 914 mmHg.  SV02 64.7   Milrinone drip rate of 0,125 mcg  Tolerating well entresto, digoxin and spironolactone. Holding loop diuretic for now.  Follow up with cardiac MRI.   Acute hypoxemic respiratory failure.  Acute pulmonary edema.  Continue with increased 02 requirements, 02 saturation is 97% on 6 L/min per [place].   CAD (coronary artery disease) Multivessel coronary artery disease NSTEMI   No B blockade due to low output heart failure decompensation.    Continue with statin therapy and aspirin. Anticoagulation with IV heparin, possible CABG during this hospitalization.   Follow up with CT surgery recommendations after GI workup.   Hypertension Continue blood pressure support with milrinone  Tolerating well entresto.   Type 2 diabetes mellitus with hyperlipidemia (HCC) Hyperglycemia/ hypoglycemia   Fasting glucose is 133, capillary down to 67   Plan to decrease basal insulin to 15 units bid  and continue with 5 units pre meal along with insulin sliding scale.    Continue with statin therapy  Hypokalemia Hyponatremia. Hypomagnesemia  Renal function  continue to be stable with serum cr at 0,74, K is 4,2 and serum bicarbonate at 24. Na 134 and Mg 2,1   Plan to continue with spironolactone  Follow up renal function in am.   GERD  (gastroesophageal reflux disease) Continue with pantoprazole Patient had dark stools on admission, Sp 2 units PRBC transfusion since admission.   Plan for EGD and colonoscopy today. Continue with pantoprazole po daily.    Iron deficiency anemia Follow up Hgb is 7,5 with plt 332  Iron stores with serum iron 21. TIBC 549, ferritin 26 and transferrin saturation 4  Sp 2 units PRBC, follow up hgb is 8,7  No IV iron until completion of cardiac MRI    Microcytic anemia Cell count stable   Tobacco abuse Smoking cessation   Left upper lobe pneumonia 09/08 fever 100.4, Blood cultures no growth Respiratory panel is negative  Chest radiograph with bilateral interstitial infiltrates with cephalization of the vasculature and hilar congestion. Infiltrates more dense at the left upper lobe.   Follow up chest CT with bilateral dense ground glass opacities bilaterally, centrally with small right pleural effusion. Follow up wbc is trending down to 14,9.   09/10 follow up chest radiograph with improvement in infiltrates.   Patient has completed antibiotic therapy for 5 days.   Out of bed to chair tid with meals Pt and Ot.  Incentive spirometer and flutter valve.         Subjective: Patient with no chest pain or dyspnea, lower extremity edema has improved, no melena   Physical Exam: Vitals:   07/01/22 0610 07/01/22 0744 07/01/22 0917 07/01/22 1100  BP:  (!) 143/98  (!) 141/91  Pulse:  95 97   Resp:  19  18  Temp:  98.3 F (36.8 C)    TempSrc:  Oral    SpO2:  99%    Weight: 71.8 kg     Height:       Neurology awake and alert ENT with mild pallor Cardiovascular with S1 and S2 present and rhythmic, positive systolic murmur at the apex No JVD Respiratory with no rales or wheezing Abdomen with no distention  Trace lower extremity edema  Data Reviewed:    Family Communication: no family at the bedside   Disposition: Status is: Inpatient Remains inpatient appropriate  because: heart failure and coronary artery disease, for possible CABG  Planned Discharge Destination: Home      Author: Tawni Millers, MD 07/01/2022 11:20 AM  For on call review [email or web address].

## 2022-07-01 NOTE — Op Note (Signed)
[facility] Patient Name: [name] Procedure Date : 07/01/2022 MRN: [id number] Attending MD: Gerrit Heck , MD Date of Birth: 06/06/1966 CSN: [id number] Age: 56 Admit Type: Inpatient Procedure:                Colonoscopy Indications:              Iron deficiency anemia                           56 year old male with history of severe mitral                            regurgitation, multivessel CAD with prior MI in                            2012 requiring PCI, diabetes, CHF with EF 40-45%,                            HTN, HLD, GERD, tobacco use disorder, EtOH                            pancreatitis 2014, admitted with chest pain,                            NSTEMI, acute on chronic systolic CHF/ischemic                            cardiomyopathy. Currently undergoing evaluation for                            CABG/MVR. Was noted to have downtrending hemoglobin                            without overt bleeding and FOBT negative stool.                           Comparison labs from 12/2020:                           - H/H 12.9/37.3, MCV/RDW 96/12                           06/24/2022 (date of admission):                           - H/H 8.2/20.5, MCV/RDW 77/21                           - PLT 364                           - BUN/creatinine 17/0.87                           - Ferritin 18, iron 21, TIBC 549, sat 4%                           -  B12 468, folate 7                           Transfused 2 units PRBCs on this admission with                            stable H/H today at 8.7/29.2. No overt bleeding                            with bowel prep overnight. Providers:                Gerrit Heck, MD, Carmie End, RN, Darliss Cheney, Technician Referring MD:              Medicines:                Monitored Anesthesia Care Complications:            No immediate complications. Estimated Blood Loss:     Estimated blood loss was minimal. Procedure:                 Pre-Anesthesia Assessment:                           - Prior to the procedure, a History and Physical                            was performed, and patient medications and                            allergies were reviewed. The patient's tolerance of                            previous anesthesia was also reviewed. The risks                            and benefits of the procedure and the sedation                            options and risks were discussed with the patient.                            All questions were answered, and informed consent                            was obtained. Prior Anticoagulants: The patient has                            taken no previous anticoagulant or antiplatelet                            agents. ASA Grade Assessment: III - A patient with  severe systemic disease. After reviewing the risks                            and benefits, the patient was deemed in                            satisfactory condition to undergo the procedure.                           After obtaining informed consent, the colonoscope                            was passed under direct vision. Throughout the                            procedure, the patient's blood pressure, pulse, and                            oxygen saturations were monitored continuously. The                            [id number]-HQ190L ([id number]) Olympus colonoscope was                            introduced through the anus and advanced to the 10                            cm into the ileum. The colonoscopy was performed                            without difficulty. The patient tolerated the                            procedure well. The quality of the bowel                            preparation was adequate. The terminal ileum,                            ileocecal valve, appendiceal orifice, and rectum                            were photographed. Scope In: 1:49:17 PM Scope  Out: 2:11:14 PM Scope Withdrawal Time: 0 hours 20 minutes 5 seconds  Total Procedure Duration: 0 hours 21 minutes 57 seconds  Findings:      The perianal and digital rectal examinations were normal.      A 15 mm polyp was found in the ascending colon. The polyp was       pedunculated. The polyp was removed with a cold snare. Resection and       retrieval were complete. Estimated blood loss was minimal.      A 8 mm polyp was found in the transverse colon. The polyp was sessile.       The polyp was removed with a cold snare. Resection and retrieval  were       complete. Estimated blood loss was minimal.      A few medium-mouthed diverticula were found in the ascending colon.      The mucosa was otherwise normal appearing throughout the remainder of       the colon. There were no areas of mucosal erythema, edema, erosions, or       ulceration. No active bleeding or high grade stigmata of bleeding noted       on this study.      Non-bleeding internal hemorrhoids were found during retroflexion. The       hemorrhoids were small and Grade I (internal hemorrhoids that do not       prolapse).      The terminal ileum appeared normal. Impression:               - One 15 mm polyp in the ascending colon, removed                            with a cold snare. Resected and retrieved.                           - One 8 mm polyp in the transverse colon, removed                            with a cold snare. Resected and retrieved.                           - Diverticulosis in the ascending colon.                           - Normal mucosa in the entire examined colon.                           - Non-bleeding internal hemorrhoids.                           - The examined portion of the ileum was normal. Recommendation:           - Return patient to hospital ward for ongoing care.                           - Continue present medications.                           - Await pathology results.                            - Given no clear etiology for bleeding on the EGD                            or colonsocopy, and likely need for systemic                            anticoagulation and antiplatelet therapy, plan for  further small bowel interrogation with video                            capsule endoscopy (VCE) today to rule out high risk                            lesions.                           - Ok to resume clear liquids 2 hours after starting                            VCE, then advance diet as tolerated poer VCE                            protocol.                           - Repeat colonoscopy in 3 years. Procedure Code(s):        --- Professional ---                           [phone], Colonoscopy, flexible; with removal of                            tumor(s), polyp(s), or other lesion(s) by snare                            technique Diagnosis Code(s):        --- Professional ---                           K63.5, Polyp of colon                           K64.0, First degree hemorrhoids                           D50.9, Iron deficiency anemia, unspecified                           K57.30, Diverticulosis of large intestine without                            perforation or abscess without bleeding CPT copyright 2019 [employer]. All rights reserved. The codes documented in this report are preliminary and upon coder review may  be revised to meet current compliance requirements. Gerrit Heck, MD 07/01/2022 2:25:50 PM Number of Addenda: 0

## 2022-07-02 ENCOUNTER — Inpatient Hospital Stay (HOSPITAL_COMMUNITY): Payer: No Typology Code available for payment source

## 2022-07-02 ENCOUNTER — Inpatient Hospital Stay (HOSPITAL_BASED_OUTPATIENT_CLINIC_OR_DEPARTMENT_OTHER): Payer: No Typology Code available for payment source

## 2022-07-02 DIAGNOSIS — I340 Nonrheumatic mitral (valve) insufficiency: Secondary | ICD-10-CM

## 2022-07-02 DIAGNOSIS — I509 Heart failure, unspecified: Secondary | ICD-10-CM

## 2022-07-02 DIAGNOSIS — I2510 Atherosclerotic heart disease of native coronary artery without angina pectoris: Secondary | ICD-10-CM

## 2022-07-02 LAB — BASIC METABOLIC PANEL
Anion gap: 10 (ref 5–15)
BUN: 7 mg/dL (ref 6–20)
CO2: 26 mmol/L (ref 22–32)
Calcium: 9.1 mg/dL (ref 8.9–10.3)
Chloride: 98 mmol/L (ref 98–111)
Creatinine, Ser: 0.67 mg/dL (ref 0.61–1.24)
GFR, Estimated: >60 mL/min (ref >60)
Glucose, Bld: 153 mg/dL — ABNORMAL HIGH (ref 70–99)
Potassium: 4.1 mmol/L (ref 3.5–5.1)
Sodium: 134 mmol/L — ABNORMAL LOW (ref 135–145)

## 2022-07-02 LAB — GLUCOSE, CAPILLARY
Glucose-Capillary: 139 mg/dL — ABNORMAL HIGH (ref 70–99)
Glucose-Capillary: 227 mg/dL — ABNORMAL HIGH (ref 70–99)

## 2022-07-02 LAB — CBC
HCT: 30.2 % — ABNORMAL LOW (ref 39.0–52.0)
Hemoglobin: 9.3 g/dL — ABNORMAL LOW (ref 13.0–17.0)
MCH: 24.2 pg — ABNORMAL LOW (ref 26.0–34.0)
MCHC: 30.8 g/dL (ref 30.0–36.0)
MCV: 78.4 fL — ABNORMAL LOW (ref 80.0–100.0)
Platelets: 540 10*3/uL — ABNORMAL HIGH (ref 150–400)
RBC: 3.85 MIL/uL — ABNORMAL LOW (ref 4.22–5.81)
RDW: 20.5 % — ABNORMAL HIGH (ref 11.5–15.5)
WBC: 8.6 10*3/uL (ref 4.0–10.5)
nRBC: 0 % (ref 0.0–0.2)

## 2022-07-02 LAB — SURGICAL PCR SCREEN
MRSA, PCR: NEGATIVE
Staphylococcus aureus: NEGATIVE

## 2022-07-02 LAB — MAGNESIUM: Magnesium: 1.8 mg/dL (ref 1.7–2.4)

## 2022-07-02 LAB — COOXEMETRY PANEL
Carboxyhemoglobin: 1.9 % — ABNORMAL HIGH (ref 0.5–1.5)
Methemoglobin: <0.7 % (ref 0.0–1.5)
O2 Saturation: 55.9 %
Total hemoglobin: 10.8 g/dL — ABNORMAL LOW (ref 12.0–16.0)

## 2022-07-02 MED ORDER — ORAL CARE MOUTH RINSE: 15.0000 mL | OROMUCOSAL | Status: DC | PRN

## 2022-07-02 MED ORDER — INSULIN GLARGINE-YFGN 100 UNIT/ML ~~LOC~~ SOLN
18.0000 [IU] | Freq: Two times a day (BID) | SUBCUTANEOUS | Status: DC
  Administered 2022-07-02 – 2022-07-03 (×2): 18 [IU] via SUBCUTANEOUS
  Filled 2022-07-02 (×3): qty 0.18

## 2022-07-02 MED ORDER — GADOBUTROL 1 MMOL/ML IV SOLN
10.0000 mL | Freq: Once | INTRAVENOUS | Status: AC | PRN
  Administered 2022-07-02: 10 mL via INTRAVENOUS

## 2022-07-02 NOTE — Progress Notes (Signed)
CARDIAC REHAB PHASE I     Pt is feeling good today. He is about to walk with mobility team. Reviewed pre [facility] information provided yesterday. Questions and concerns addressed. Will continue to follow .   [phone] Vanessa Barbara, RN BSN 07/02/2022 11:09 AM

## 2022-07-02 NOTE — Progress Notes (Signed)
1 Day Post-Op Procedure(s) (LRB): ESOPHAGOGASTRODUODENOSCOPY (EGD) WITH PROPOFOL (N/A) COLONOSCOPY (N/A) BIOPSY HEMOSTASIS CLIP PLACEMENT POLYPECTOMY Subjective: Sleeping comfortably in bed. No complaints  Objective: Vital signs in last 24 hours: Temp:  [97.4 F (36.3 C)-98.2 F (36.8 C)] 98.2 F (36.8 C) (09/13 0729) Pulse Rate:  [72-105] 93 (09/13 0729) Cardiac Rhythm: Normal sinus rhythm (09/13 0702) Resp:  [14-21] 14 (09/13 0729) BP: (116-142)/(69-91) 116/69 (09/13 0729) SpO2:  [94 %-99 %] 95 % (09/13 0729) Weight:  [71.4 kg-72.6 kg] 71.4 kg (09/13 0400)  Hemodynamic parameters for last 24 hours: CVP:  [3 mmHg-9 mmHg] 3 mmHg  Intake/Output from previous day: 09/12 0701 - 09/13 0700 In: 880 [P.O.:480; I.V.:400] Out: 1250 [Urine:1250] Intake/Output this shift: No intake/output data recorded.  General appearance: cooperative Heart: regular rate and rhythm, S1, S2 normal, no murmur, click, rub or gallop Lungs: clear to auscultation bilaterally  Lab Results: Recent Labs    07/01/22 0425 07/02/22 0535  WBC 9.2 8.6  HGB 8.7* 9.3*  HCT 29.2* 30.2*  PLT 472* 540*   BMET:  Recent Labs    07/01/22 0425 07/02/22 0535  NA 134* 134*  K 4.2 4.1  CL 101 98  CO2 24 26  GLUCOSE 133* 153*  BUN 10 7  CREATININE 0.74 0.67  CALCIUM 9.1 9.1    PT/INR: No results for input(s): "LABPROT", "INR" in the last 72 hours. ABG    Component Value Date/Time   PHART 7.538 (H) 06/25/2022 1518   HCO3 26.1 06/25/2022 1524   TCO2 27 06/25/2022 1524   O2SAT 55.9 07/02/2022 0535   CBG (last 3)  Recent Labs    07/01/22 1553 07/01/22 2108 07/02/22 0610  GLUCAP 102* 200* 139*    Assessment/Plan: NSTEMI complicated with systolic CHF and diffuse CAD with ischemic MR    Still on milrinone and with good hemodynamics     Awaiting MRI results     Will discuss with HF team timing of surgical intervention after MRI results obtained Anemia    Endoscopy results noted    HCT  stable      LOS: 8 days    Coralie Common 07/02/2022

## 2022-07-02 NOTE — Progress Notes (Addendum)
PROGRESS NOTE       [name]             WNI:[id number] DOB: 08/11/1966 DOA: 06/24/2022 PCP: Charolette Forward, MD     56 yo male with the past medical history of coronary artery disease, hypertension, and dyslipidemia who presented with chest pain.BNP 639, Trop 139, CXR with pulmonary vascular congestion, bilateral interstitial infiltrates, small pleural effusions. -2D echo noted EF of 40-45% with wall motion abnormality, severe MR -TEE moderate to severe MR -Left heart cath 9/6 with advanced multivessel CAD -9/6 started milrinone [facility] course complicated by worsening anemia, transfused PRBC 9/11 -9/12, EGD/colon Skippy with nonobstructing Schatzki ring, hiatal hernia, normal stomach, colonoscopy 2 polyps resected, diverticulosis, plan for capsule study     Subjective: Feels ok, breathing better   Assessment and Plan:   Acute hypoxic respiratory failure Acute systolic heart failure (HCC) Severe MR -Echo noted EF 40-45%, with wall motion abnormality, RV function is preserved, severe MR  TEE with moderate to severe mitral regurgitation  -09/06 cardiac catheterization with advance multivessel coronary artery disease.  -Advanced heart failure team following, diuresed with IV Lasix, off diuretics now, remains on milrinone -Remains on Entresto digoxin and Aldactone -Wean O2 as tolerated -For cardiac MRI today   Multivessel CAD, severe MR NSTEMI  -Troponin peaked at 1477 -LHC with severe multivessel CAD and high-grade left main disease -T CTS consulted -Remains on aspirin statin and heparin GTT, beta-blocker on hold   Type 2 diabetes mellitus with hyperlipidemia (HCC) -A1c is 8.2 -Start SGLT2i as tolerated -Increase Semglee dose, continue meal coverage   Iron deficiency anemia -Hemoglobin trended down to 7.1, 2 units of PRBC transfused -Anemia panel with iron deficiency, IV iron on hold -GI consulted, plan for EGD/colonoscopy -Report of melena on admission, plan for  video capsule endoscopy   EtOH abuse   Hypokalemia Hyponatremia. Hypomagnesemia   GERD (gastroesophageal reflux disease) Continue pantoprazole    Tobacco abuse Smoking cessation    ? Left upper lobe pneumonia 09/08 fever 100.4, -Questionable pneumonia on admission imaging -Clinically do not suspect pneumonia, suspect symptoms are primarily related to pulmonary edema, DC antibiotics and monitor       DVT prophylaxis: IV heparin Code Status: Full code Family Communication: Discussed patient detail, no family at bedside Disposition Plan: Home ending CABG   Consultants: Cards, TCTS     Procedures: EGD/colonoscopy   Antimicrobials:      Objective:       Vitals:    07/01/22 0353 07/01/22 0610 07/01/22 0744 07/01/22 0917  BP: 129/78   (!) 143/98    Pulse: 93   95 97  Resp: 18   19    Temp: 98.7 F (37.1 C)   98.3 F (36.8 C)    TempSrc: Oral   Oral    SpO2: 98%   99%    Weight:   71.8 kg      Height:              Intake/Output Summary (Last 24 hours) at 07/01/2022 1015 Last data filed at 06/30/2022 1900    Gross per 24 hour  Intake 1821.82 ml  Output 1000 ml  Net 821.82 ml         Filed Weights    06/29/22 0502 06/30/22 0500 07/01/22 0610  Weight: 70.4 kg 67.7 kg 71.8 kg      Examination:   General exam: Pleasant male sitting up in bed, AAO x3, no distress HEENT: No JVD, right IJ  central line CVS: S1-S2, regular rhythm Lungs: Clear bilaterally Abdomen: Soft, nontender, bowel sounds present  Extremities: No edema Skin: No rashes Psychiatry:  Mood & affect appropriate.        Data Reviewed:    CBC: Last Labs           Recent Labs  Lab 06/28/22 0410 06/28/22 2232 06/29/22 0430 06/30/22 0528 06/30/22 1410 07/01/22 0425  WBC 16.2*  --  14.9* 11.4* 8.5 9.2  HGB 7.1* 8.0* 7.9* 7.7* 8.7* 8.7*  HCT 23.3* 26.1* 26.0* 24.6* 28.4* 29.2*  MCV 74.9*  --  76.2* 76.2* 79.1* 79.1*  PLT 330  --  371 411* 416* 472*      Basic Metabolic Panel: Last  Labs             Recent Labs  Lab 06/24/22 1143 06/25/22 0641 06/26/22 0752 06/27/22 0430 06/28/22 0410 06/29/22 0430 06/30/22 0528 07/01/22 0425  NA  --    < > 134* 132* 128* 131* 133* 134*  K  --    < > 3.3* 3.8 4.2 4.1 3.7 4.2  CL  --    < > 93* 97* 94* 94* 96* 101  CO2  --    < > '28 26 26 27 27 24  '$ GLUCOSE  --    < > 336* 269* 337* 344* 217* 133*  BUN  --    < > '13 10 10 12 13 10  '$ CREATININE  --    < > 0.92 0.81 1.00 0.85 0.87 0.74  CALCIUM  --    < > 9.2 8.6* 8.6* 9.1 9.2 9.1  MG 1.5*  --  1.3* 1.8  --   --  1.7 2.1   < > = values in this interval not displayed.      GFR: Estimated Creatinine Clearance: 106 mL/min (by C-G formula based on SCr of 0.74 mg/dL). Liver Function Tests: Last Labs      Recent Labs  Lab 07/01/22 0425  AST 335*  ALT 231*  ALKPHOS 123  BILITOT 0.7  PROT 7.1  ALBUMIN 2.3*      Last Labs   No results for input(s): "LIPASE", "AMYLASE" in the last 168 hours.   Last Labs   No results for input(s): "AMMONIA" in the last 168 hours.   Coagulation Profile: Last Labs   No results for input(s): "INR", "PROTIME" in the last 168 hours.   Cardiac Enzymes: Last Labs   No results for input(s): "CKTOTAL", "CKMB", "CKMBINDEX", "TROPONINI" in the last 168 hours.   BNP (last 3 results) Recent Labs (within last 365 days)  No results for input(s): "PROBNP" in the last 8760 hours.   HbA1C: Recent Labs (last 2 labs)   No results for input(s): "HGBA1C" in the last 72 hours.   CBG: Last Labs          Recent Labs  Lab 06/30/22 1650 06/30/22 2105 06/30/22 2246 07/01/22 0616 07/01/22 0800  GLUCAP 160* 203* 158* 131* 123*      Lipid Profile: Recent Labs (last 2 labs)   No results for input(s): "CHOL", "HDL", "LDLCALC", "TRIG", "CHOLHDL", "LDLDIRECT" in the last 72 hours.   Thyroid Function Tests: Recent Labs (last 2 labs)   No results for input(s): "TSH", "T4TOTAL", "FREET4", "T3FREE", "THYROIDAB" in the last 72 hours.   Anemia  Panel: Recent Labs (last 2 labs)   No results for input(s): "VITAMINB12", "FOLATE", "FERRITIN", "TIBC", "IRON", "RETICCTPCT" in the last 72 hours.   Urine analysis: Labs (  Brief)          Component Value Date/Time    COLORURINE YELLOW 06/26/2022 2116    APPEARANCEUR CLEAR 06/26/2022 2116    LABSPEC 1.023 06/26/2022 2116    PHURINE 5.0 06/26/2022 2116    GLUCOSEU >=500 (A) 06/26/2022 2116    HGBUR NEGATIVE 06/26/2022 2116    BILIRUBINUR NEGATIVE 06/26/2022 2116    KETONESUR NEGATIVE 06/26/2022 2116    PROTEINUR NEGATIVE 06/26/2022 2116    UROBILINOGEN 1.0 01/30/2015 1029    NITRITE NEGATIVE 06/26/2022 2116    LEUKOCYTESUR NEGATIVE 06/26/2022 2116      Sepsis Labs: '@LABRCNTIP'$ (procalcitonin:4,lacticidven:4)   )        Recent Results (from the past 240 hour(s))  Urine Culture     Status: None    Collection Time: 06/26/22  9:16 PM    Specimen: Urine, Clean Catch  Result Value Ref Range Status    Specimen Description URINE, CLEAN CATCH   Final    Special Requests NONE   Final    Culture     Final      NO GROWTH Performed at [facility], [place] [address], [place], [place] [address]      Report Status 06/29/2022 FINAL   Final  Culture, blood (Routine X 2) w Reflex to ID Panel     Status: None    Collection Time: 06/26/22 10:24 PM    Specimen: BLOOD  Result Value Ref Range Status    Specimen Description BLOOD RIGHT ANTECUBITAL   Final    Special Requests     Final      BOTTLES DRAWN AEROBIC AND ANAEROBIC Blood Culture adequate volume    Culture     Final      NO GROWTH 5 DAYS Performed at [facility], [place] [address], [place], [place] [address]      Report Status 07/01/2022 FINAL   Final  Culture, blood (Routine X 2) w Reflex to ID Panel     Status: None    Collection Time: 06/26/22 10:41 PM    Specimen: BLOOD RIGHT HAND  Result Value Ref Range Status    Specimen Description BLOOD RIGHT HAND   Final    Special Requests     Final      BOTTLES DRAWN AEROBIC  AND ANAEROBIC Blood Culture adequate volume    Culture     Final      NO GROWTH 5 DAYS Performed at [facility], [place] [address], [place],  [place] [address]      Report Status 07/01/2022 FINAL   Final  Respiratory (~20 pathogens) panel by PCR     Status: None    Collection Time: 06/27/22  7:28 AM    Specimen: Nasopharyngeal Swab; Respiratory  Result Value Ref Range Status    Adenovirus NOT DETECTED NOT DETECTED Final    Coronavirus 229E NOT DETECTED NOT DETECTED Final      Comment: (NOTE) The Coronavirus on the Respiratory Panel, DOES NOT test for the novel  Coronavirus (2019 nCoV)      Coronavirus HKU1 NOT DETECTED NOT DETECTED Final    Coronavirus NL63 NOT DETECTED NOT DETECTED Final    Coronavirus OC43 NOT DETECTED NOT DETECTED Final    Metapneumovirus NOT DETECTED NOT DETECTED Final    Rhinovirus / Enterovirus NOT DETECTED NOT DETECTED Final    Influenza A NOT DETECTED NOT DETECTED Final    Influenza B NOT DETECTED NOT DETECTED Final    Parainfluenza Virus  1 NOT DETECTED NOT DETECTED Final    Parainfluenza Virus 2 NOT DETECTED NOT DETECTED Final    Parainfluenza Virus 3 NOT DETECTED NOT DETECTED Final    Parainfluenza Virus 4 NOT DETECTED NOT DETECTED Final    Respiratory Syncytial Virus NOT DETECTED NOT DETECTED Final    Bordetella pertussis NOT DETECTED NOT DETECTED Final    Bordetella Parapertussis NOT DETECTED NOT DETECTED Final    Chlamydophila pneumoniae NOT DETECTED NOT DETECTED Final    Mycoplasma pneumoniae NOT DETECTED NOT DETECTED Final      Comment: Performed at [facility], [place] [address] [place], [place] [address]  SARS Coronavirus 2 by RT PCR (hospital order, performed in [facility] hospital lab) *cepheid single result test* Anterior Nasal Swab     Status: None    Collection Time: 06/27/22  7:28 AM    Specimen: Anterior Nasal Swab  Result Value Ref Range Status    SARS Coronavirus 2 by RT PCR NEGATIVE NEGATIVE Final      Comment:  (NOTE) SARS-CoV-2 target nucleic acids are NOT DETECTED.   The SARS-CoV-2 RNA is generally detectable in upper and lower respiratory specimens during the acute phase of infection. The lowest concentration of SARS-CoV-2 viral copies this assay can detect is 250 copies / mL. A negative result does not preclude SARS-CoV-2 infection and should not be used as the sole basis for treatment or other patient management decisions.  A negative result may occur with improper specimen collection / handling, submission of specimen other than nasopharyngeal swab, presence of viral mutation(s) within the areas targeted by this assay, and inadequate number of viral copies (<250 copies / mL). A negative result must be combined with clinical observations, patient history, and epidemiological information.   Fact Sheet for Patients:   https://www.patel.info/   Fact Sheet for Healthcare Providers: https://hall.com/   This test is not yet approved or  cleared by the [place] FDA and has been authorized for detection and/or diagnosis of SARS-CoV-2 by FDA under an Emergency Use Authorization (EUA).  This EUA will remain in effect (meaning this test can be used) for the duration of the COVID-19 declaration under Section 564(b)(1) of the Act, 21 U.S.C. section 360bbb-3(b)(1), unless the authorization is terminated or revoked sooner.   Performed at [facility], [place] [address], [place], [place] [address]        Radiology Studies:  Imaging Results (Last 48 hours)  VAS US DOPPLER PRE CABG   Result Date: 07/01/2022 PREOPERATIVE VASCULAR EVALUATION Patient Name:  [name]  Date of Exam:   07/01/2022 Medical Rec #: [id number]             Accession #:    [id number] Date of Birth: 1965-12-04              Patient Gender: M Patient Age:   2 years Exam Location:  [facility] Procedure:      VAS US DOPPLER PRE CABG Referring Phys: Coralie Common  --------------------------------------------------------------------------------  Indications:      Pre-CABG. Risk Factors:     Hypertension, hyperlipidemia, prior MI, coronary artery                   disease. Comparison Study: no prior Performing Technologist: Archie Patten RVS  Examination Guidelines: A complete evaluation includes B-mode imaging, spectral Doppler, color Doppler, and power Doppler as needed of all accessible portions of each vessel. Bilateral testing is considered an integral part of a complete  examination. Limited examinations for reoccurring indications may be performed as noted.  Right Carotid Findings: +----------+--------+--------+--------+------------+--------+           PSV cm/sEDV cm/sStenosisDescribe    Comments +----------+--------+--------+--------+------------+--------+ CCA Prox  81      16              heterogenous         +----------+--------+--------+--------+------------+--------+ CCA Distal63      14              heterogenous         +----------+--------+--------+--------+------------+--------+ ICA Prox  58      18      1-39%   heterogenous         +----------+--------+--------+--------+------------+--------+ ICA Distal72      25                                   +----------+--------+--------+--------+------------+--------+ ECA       63                                           +----------+--------+--------+--------+------------+--------+ +----------+--------+-------+--------+------------+           PSV cm/sEDV cmsDescribeArm Pressure +----------+--------+-------+--------+------------+ [id number]                                  +----------+--------+-------+--------+------------+ +---------+--------+--+--------+--+---------+ VertebralPSV cm/s31EDV cm/s11Antegrade +---------+--------+--+--------+--+---------+ Left Carotid Findings: +----------+--------+--------+--------+------------+--------+           PSV cm/sEDV  cm/sStenosisDescribe    Comments +----------+--------+--------+--------+------------+--------+ CCA Prox  109     18              heterogenous         +----------+--------+--------+--------+------------+--------+ CCA Distal91      15              heterogenous         +----------+--------+--------+--------+------------+--------+ ICA Prox  57      19      1-39%   heterogenous         +----------+--------+--------+--------+------------+--------+ ICA Distal70      20                                   +----------+--------+--------+--------+------------+--------+ ECA       75      9                                    +----------+--------+--------+--------+------------+--------+ +----------+--------+--------+--------+------------+ SubclavianPSV cm/sEDV cm/sDescribeArm Pressure +----------+--------+--------+--------+------------+           103                                  +----------+--------+--------+--------+------------+ +---------+--------+--+--------+--+---------+ VertebralPSV cm/s45EDV cm/s12Antegrade +---------+--------+--+--------+--+---------+  ABI Findings: +--------+------------------+-----+---------+--------+ Right   Rt Pressure (mmHg)IndexWaveform Comment  +--------+------------------+-----+---------+--------+ Brachial                       triphasic         +--------+------------------+-----+---------+--------+ +--------+------------------+-----+---------+-------+ Left    Lt Pressure (mmHg)IndexWaveform Comment +--------+------------------+-----+---------+-------+ Brachial  triphasic        +--------+------------------+-----+---------+-------+  Right Doppler Findings: +--------+--------+-----+---------+--------+ Site    PressureIndexDoppler  Comments +--------+--------+-----+---------+--------+ Brachial             triphasic         +--------+--------+-----+---------+--------+ Radial                triphasic         +--------+--------+-----+---------+--------+ Ulnar                triphasic         +--------+--------+-----+---------+--------+  Left Doppler Findings: +--------+--------+-----+---------+--------+ Site    PressureIndexDoppler  Comments +--------+--------+-----+---------+--------+ Brachial             triphasic         +--------+--------+-----+---------+--------+ Radial               triphasic         +--------+--------+-----+---------+--------+ Ulnar                triphasic         +--------+--------+-----+---------+--------+   Summary: Right Carotid: Velocities in the right ICA are consistent with a 1-39% stenosis. Left Carotid: Velocities in the left ICA are consistent with a 1-39% stenosis. Vertebrals: Bilateral vertebral arteries demonstrate antegrade flow. Right Upper Extremity: Doppler waveforms decrease 50% with right radial compression. Doppler waveforms remain within normal limits with right ulnar compression. Left Upper Extremity: Doppler waveforms remain within normal limits with left radial compression. Doppler waveforms remain within normal limits with left ulnar compression.     Preliminary     DG Chest 1 View   Result Date: 06/29/2022 CLINICAL DATA:  Difficulty breathing EXAM: CHEST  1 VIEW COMPARISON:  Previous studies including the chest radiograph done on 06/26/2022 and CT done on 06/27/2022 FINDINGS: Transverse diameter of heart is slightly increased. There is interval improvement in the aeration in both lungs suggesting resolving pulmonary edema. There is residual prominence of interstitial markings in right parahilar region and left lower lung field. No new focal infiltrates are seen. There is no pleural effusion or pneumothorax. Tip of right IJ central venous catheter is seen in superior vena cava. IMPRESSION: There is significant interval decrease in alveolar densities in both lungs, more so on the left side suggesting resolving  pulmonary edema or resolving bilateral pneumonia. There is residual prominence of markings in the right parahilar region and left lower lung field suggesting residual interstitial edema or interstitial pneumonia. No new focal infiltrates are seen. Electronically Signed   By: Elmer Picker M.D.   On: 06/29/2022 16:11        Scheduled Meds:  aspirin EC  81 mg Oral Daily   bisacodyl  10 mg Oral Q6H   Chlorhexidine Gluconate Cloth  6 each Topical Daily   digoxin  0.125 mg Oral Daily   feeding supplement  237 mL Oral BID BM   heparin injection (subcutaneous)  5,000 Units Subcutaneous Q8H   insulin aspart  0-15 Units Subcutaneous TID WC   insulin aspart  0-5 Units Subcutaneous QHS   insulin aspart  5 Units Subcutaneous TID WC   insulin glargine-yfgn  25 Units Subcutaneous BID   pantoprazole  40 mg Oral Q0600   rosuvastatin  40 mg Oral Daily   sacubitril-valsartan  1 tablet Oral BID   sodium chloride flush  3 mL Intravenous Q12H   spironolactone  25 mg Oral Daily    Continuous Infusions:  sodium chloride     cefTRIAXone (ROCEPHIN)  IV 2 g (06/30/22 1556)   milrinone 0.125 mcg/kg/min (07/01/22 0418)       LOS: 7 days      Time spent: 62mn       PDomenic Polite MD [facility]     07/01/2022, 10:15 AM            Note Details  Author JDomenic Polite MD File Time 07/02/2022 12:40 PM  Author Type Physician Status Signed  Last Editor JDomenic Polite MD Service Internal Medicine  Hospital Acct # [id number] Date 06/24/2022

## 2022-07-02 NOTE — Plan of Care (Signed)
  Problem: Health Behavior/Discharge Planning: Goal: Ability to manage health-related needs will improve Outcome: Progressing   Problem: Clinical Measurements: Goal: Ability to maintain clinical measurements within normal limits will improve Outcome: Progressing Goal: Diagnostic test results will improve Outcome: Progressing Goal: Respiratory complications will improve Outcome: Progressing   Problem: Activity: Goal: Risk for activity intolerance will decrease Outcome: Progressing   Problem: Nutrition: Goal: Adequate nutrition will be maintained Outcome: Progressing   Problem: Coping: Goal: Level of anxiety will decrease Outcome: Progressing   Problem: Elimination: Goal: Will not experience complications related to bowel motility Outcome: Progressing   

## 2022-07-02 NOTE — Procedures (Signed)
VCE completed. Images reviewed, and notable for the following:  Indication: 56 year old male with history of severe mitral regurgitation, multivessel CAD with prior MI in  2012 requiring PCI, diabetes, CHF with EF 40-45%,  HTN, HLD, GERD, tobacco use disorder, EtOH pancreatitis 2014, admitted with chest pain,  NSTEMI, acute on chronic systolic CHF/ischemic   cardiomyopathy. Currently undergoing evaluation for CABG/MVR. Was noted to have downtrending hemoglobin without overt bleeding and FOBT negative stool.  Labs consistent with iron deficiency anemia/folate deficiency, possibly with component of superimposed acute blood anemia (last comparison lab was 12/2020).   Good responsed to 2 units PRBCs  and no overt bleeding. H/H stable today.   EGD and colonoscopy performed 9/12. EGD with hiatal hernia and Schatzki ring, but otherwise normal stomach/duodenum. Gastric and duodenal biopsies obtained. Colonoscopy with 2 polyps and right sided diverticulosis. Given no bleeding or stigmata of bleeding on either study, placed VCE for further small bowel interrogation.   Findings: 1) Complete study with adequate prep and visualization of the small bowel 2) First duodenal image at 22 min 3) First cecal image at 5 hr 41 min (small bowel transit time approximately 5 hr 20 min) 4) Focal areas of blood noted in the proximal small bowel, starting approximately 2 min into the small bowel, and last area noted at 50 min into small bowel. All of these areas are in the proximal small bowel. The blood appears to be adherent and focal, without blood filling the small bowel lumen. Additionally, no blood noted in the mid or distal small bowel to suggest active bleeding.  5) Benign small bowel lymphangiectasias 6) Gastric biopsy site noted. 7) Small amount of blood in proximal colon, consistent with polypectomy site.   Summary: Based on endoscopic appearance and location, strongly favor the blood sites in the proximal small  bowel to be biopsy ulcers with adherent blood. The mucosa was otherwise normal appearing throughout the remainder of the study, without any noted AVMs, mass effect, or non-biopsy related ulceration.  Additionally, the blood stays local and appears adherent, and not filling the small bowel lumen nor seen in distal locations, which suggests against small bowel bleeding source.   Recommendations: 1) Continue monitoring serial CBC checks with additional blood products as needed per protocol. 2) IV iron once cardiac MRI completed 3) Ok to proceed with CABG/MVR from a GI standpoint 4) Resume PPI for treatment of GERD and to promote healing at biopsy sites along with gastric prophylaxis given expected need for anticoagulation/antiplatelet therapy post op.  5) Patient did clear VCE this AM. Ok to proceed with MRI without issue 6) Inpatient GI service will sign off at this time. Please do not hesitate to contact with additional questions or concerns. He will o/w plan to f/u with his primary GI at the [place].   Gerrit Heck, DO, [facility]

## 2022-07-02 NOTE — Progress Notes (Addendum)
Patient ID: [name], male   DOB: January 03, 1966, 56 y.o.   MRN: [id number]   Advanced Heart Failure Rounding Note  PCP-Cardiologist: None   Subjective:    09/06: Started milrinone 0.125 to facilitate diuresis in setting of low-output 09/07: Milrinone increased to 0.25. 09/09: Milrinone decreased to 0.125  09/11: 1uPRBc  9/12: EGD/ Colo>>Non-obstructing Schatzki ring, hiatal hernia, normal stomach, 2 polyps (resected, path pending), diverticulosis, non bleeding hemorrhoids. VCE pending   On Milrinone 0.125. Co-ox 56% today. Volume status low, CVP 2-3. Was NPO much of the day yesterday + bowel prep for GI studies.   Hgb stable, 9.3   Awaiting results of VCE. Planning cMRI today.  Tachycardic, 130s. ? ST vs 2:1 AFL. 12 Lead EKG pending.   Feels well today. Denies dyspnea. No CP.     Objective:   Weight Range: 71.4 kg Body mass index is 21.35 kg/m.   Vital Signs:   Temp:  [97.4 F (36.3 C)-98.2 F (36.8 C)] 98.2 F (36.8 C) (09/13 0729) Pulse Rate:  [72-105] 93 (09/13 0729) Resp:  [14-21] 14 (09/13 0729) BP: (116-142)/(69-91) 116/69 (09/13 0729) SpO2:  [94 %-99 %] 95 % (09/13 0729) Weight:  [71.4 kg-72.6 kg] 71.4 kg (09/13 0400) Last BM Date : 06/25/22  Weight change: Filed Weights   07/01/22 0610 07/01/22 1248 07/02/22 0400  Weight: 71.8 kg 72.6 kg 71.4 kg    Intake/Output:   Intake/Output Summary (Last 24 hours) at 07/02/2022 0809 Last data filed at 07/02/2022 0553 Gross per 24 hour  Intake 880 ml  Output 1250 ml  Net -370 ml     Physical Exam   CVP 2-3  General:  Well appearing, sitting up in chair. No respiratory difficulty HEENT: normal Neck: supple. no JVD. Carotids 2+ bilat; no bruits. No lymphadenopathy or thyromegaly appreciated. Cor: PMI nondisplaced. Tachy rhythm and rate. No rubs, gallops or murmurs. Lungs: clear Abdomen: soft, nontender, nondistended. No hepatosplenomegaly. No bruits or masses. Good bowel sounds. Extremities: no  cyanosis, clubbing, rash, edema, + Rt IJ CVC Neuro: alert & oriented x 3, cranial nerves grossly intact. moves all 4 extremities w/o difficulty. Affect pleasant.   Telemetry   Tachy 130s, ? ST vs VT, 12 lead EKG pending ( personally reviewed).   Labs    CBC Recent Labs    07/01/22 0425 07/02/22 0535  WBC 9.2 8.6  HGB 8.7* 9.3*  HCT 29.2* 30.2*  MCV 79.1* 78.4*  PLT 472* 409*   Basic Metabolic Panel Recent Labs    07/01/22 0425 07/02/22 0535  NA 134* 134*  K 4.2 4.1  CL 101 98  CO2 24 26  GLUCOSE 133* 153*  BUN 10 7  CREATININE 0.74 0.67  CALCIUM 9.1 9.1  MG 2.1 1.8   Liver Function Tests Recent Labs    07/01/22 0425  AST 335*  ALT 231*  ALKPHOS 123  BILITOT 0.7  PROT 7.1  ALBUMIN 2.3*   No results for input(s): "LIPASE", "AMYLASE" in the last 72 hours. Cardiac Enzymes No results for input(s): "CKTOTAL", "CKMB", "CKMBINDEX", "TROPONINI" in the last 72 hours.  BNP: BNP (last 3 results) Recent Labs    06/24/22 0809 06/25/22 0133  BNP 639.0* 830.5*    ProBNP (last 3 results) No results for input(s): "PROBNP" in the last 8760 hours.   D-Dimer No results for input(s): "DDIMER" in the last 72 hours. Hemoglobin A1C No results for input(s): "HGBA1C" in the last 72 hours.  Fasting Lipid Panel No results for input(s): "CHOL", "  HDL", "LDLCALC", "TRIG", "CHOLHDL", "LDLDIRECT" in the last 72 hours.  Thyroid Function Tests No results for input(s): "TSH", "T4TOTAL", "T3FREE", "THYROIDAB" in the last 72 hours.  Invalid input(s): "FREET3"  Other results:   Imaging    VAS US DOPPLER PRE CABG  Result Date: 07/01/2022 PREOPERATIVE VASCULAR EVALUATION Patient Name:  [name]  Date of Exam:   07/01/2022 Medical Rec #: [id number]             Accession #:    [id number] Date of Birth: Mar 27, 1966              Patient Gender: M Patient Age:   56 years Exam Location:  [facility] Procedure:      VAS US DOPPLER PRE CABG Referring Phys: Coralie Common --------------------------------------------------------------------------------  Indications:      Pre-CABG. Risk Factors:     Hypertension, hyperlipidemia, prior MI, coronary artery                   disease. Comparison Study: no prior Performing Technologist: Archie Patten RVS  Examination Guidelines: A complete evaluation includes B-mode imaging, spectral Doppler, color Doppler, and power Doppler as needed of all accessible portions of each vessel. Bilateral testing is considered an integral part of a complete examination. Limited examinations for reoccurring indications may be performed as noted.  Right Carotid Findings: +----------+--------+--------+--------+------------+--------+           PSV cm/sEDV cm/sStenosisDescribe    Comments +----------+--------+--------+--------+------------+--------+ CCA Prox  81      16              heterogenous         +----------+--------+--------+--------+------------+--------+ CCA Distal63      14              heterogenous         +----------+--------+--------+--------+------------+--------+ ICA Prox  58      18      1-39%   heterogenous         +----------+--------+--------+--------+------------+--------+ ICA Distal72      25                                   +----------+--------+--------+--------+------------+--------+ ECA       63                                           +----------+--------+--------+--------+------------+--------+ +----------+--------+-------+--------+------------+           PSV cm/sEDV cmsDescribeArm Pressure +----------+--------+-------+--------+------------+ [id number]                                  +----------+--------+-------+--------+------------+ +---------+--------+--+--------+--+---------+ VertebralPSV cm/s31EDV cm/s11Antegrade +---------+--------+--+--------+--+---------+ Left Carotid Findings: +----------+--------+--------+--------+------------+--------+           PSV  cm/sEDV cm/sStenosisDescribe    Comments +----------+--------+--------+--------+------------+--------+ CCA Prox  109     18              heterogenous         +----------+--------+--------+--------+------------+--------+ CCA Distal91      15              heterogenous         +----------+--------+--------+--------+------------+--------+ ICA Prox  57      19      1-39%   heterogenous         +----------+--------+--------+--------+------------+--------+  ICA Distal70      20                                   +----------+--------+--------+--------+------------+--------+ ECA       75      9                                    +----------+--------+--------+--------+------------+--------+ +----------+--------+--------+--------+------------+ SubclavianPSV cm/sEDV cm/sDescribeArm Pressure +----------+--------+--------+--------+------------+           103                                  +----------+--------+--------+--------+------------+ +---------+--------+--+--------+--+---------+ VertebralPSV cm/s45EDV cm/s12Antegrade +---------+--------+--+--------+--+---------+  ABI Findings: +--------+------------------+-----+---------+--------+ Right   Rt Pressure (mmHg)IndexWaveform Comment  +--------+------------------+-----+---------+--------+ Brachial                       triphasic         +--------+------------------+-----+---------+--------+ +--------+------------------+-----+---------+-------+ Left    Lt Pressure (mmHg)IndexWaveform Comment +--------+------------------+-----+---------+-------+ Brachial                       triphasic        +--------+------------------+-----+---------+-------+  Right Doppler Findings: +--------+--------+-----+---------+--------+ Site    PressureIndexDoppler  Comments +--------+--------+-----+---------+--------+ Brachial             triphasic         +--------+--------+-----+---------+--------+ Radial                triphasic         +--------+--------+-----+---------+--------+ Ulnar                triphasic         +--------+--------+-----+---------+--------+  Left Doppler Findings: +--------+--------+-----+---------+--------+ Site    PressureIndexDoppler  Comments +--------+--------+-----+---------+--------+ Brachial             triphasic         +--------+--------+-----+---------+--------+ Radial               triphasic         +--------+--------+-----+---------+--------+ Ulnar                triphasic         +--------+--------+-----+---------+--------+   Summary: Right Carotid: Velocities in the right ICA are consistent with a 1-39% stenosis. Left Carotid: Velocities in the left ICA are consistent with a 1-39% stenosis. Vertebrals: Bilateral vertebral arteries demonstrate antegrade flow. Right Upper Extremity: Doppler waveforms decrease 50% with right radial compression. Doppler waveforms remain within normal limits with right ulnar compression. Left Upper Extremity: Doppler waveforms remain within normal limits with left radial compression. Doppler waveforms remain within normal limits with left ulnar compression.  Electronically signed by Servando Snare MD on 07/01/2022 at 9:33:25 PM.    Final      Medications:     Scheduled Medications:  aspirin EC  81 mg Oral Daily   Chlorhexidine Gluconate Cloth  6 each Topical Daily   digoxin  0.125 mg Oral Daily   feeding supplement  237 mL Oral BID BM   heparin injection (subcutaneous)  5,000 Units Subcutaneous Q8H   insulin aspart  0-15 Units Subcutaneous TID WC   insulin aspart  0-5 Units Subcutaneous QHS   insulin aspart  5 Units Subcutaneous TID WC   insulin glargine-yfgn  15 Units Subcutaneous BID   pantoprazole  40 mg Oral Q0600   rosuvastatin  40 mg Oral Daily   sacubitril-valsartan  1 tablet Oral BID   sodium chloride flush  3 mL Intravenous Q12H   spironolactone  25 mg Oral Daily    Infusions:  sodium  chloride     milrinone 0.125 mcg/kg/min (07/01/22 1318)    PRN Medications: sodium chloride, acetaminophen, guaiFENesin-dextromethorphan, LORazepam, ondansetron (ZOFRAN) IV, oxyCODONE, sodium chloride flush    Patient Profile   56 y/o AAM w/ h/o multivessel CAD s/p remote MI in 2012 and multiple PCIs to LAD, diag, ramus and OM vessels, HTN, HLD, Type 2 DM and tobacco use, admitted for NSTEMI and acute CHF. 2D Echo and TEE w/ severe mitral regurgitation. LVEF 40-45%, RV ok. Cath w/ multivessel CAD including high grade LM disease, elevated filling pressures and preserved CO.    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  - cMRI today. TCTS consulted  2.  Acute on chronic systolic CHF/Ischemic cardiomyopathy - Echo with EF 40-45%.  - RHC w/ RA 8, PA 48/23 (38), PCWP mean 26, LVEDP 21,  PA sat 49% - Today, co-ox 56% CVP 2-3    - Continue digoxin 0.125.  - Keep milrinone at 0.125 to maintain hemodynamic stability while making decisions on forward path.  - No diuretic today.  May need to give gentle IVF hydration  - Continue Entresto 49/51 bid.   - Continue spironolactone 25 mg daily.  - No beta blocker for now with low-output    3. NSTEMI w/ Multivessel CAD  - Known hx prior CAD with MI in 2012 and prior multivessel PCI - HS trop peak 1,477 - LHC w/ severe MVCAD including high grade LM disease  - currently stable w/o CP  - ASA + high intensity statin, heparin gtt - Think best path forwards would be CABG-MVR.   4. Type 2DM  - per primary team  - A1c 8.2%  - Eventual SGLT2i - Will need good control of blood sugars if goes for CABG   5. IDA - Hgb 8.4---> 7.4 --> 7.1 -> 1 unit PRBCs -> 7.9-> 8.7->9.3 today - no obvious source blood loss, FOBT negative.  - Iron  21, sats 4%. No feraheme yet, wait  until cMRI completed  - GI consulted. EGD/ Colo>>Non-obstructing Schatzki ring, hiatal hernia, normal stomach, 2 polyps (resected, path pending), diverticulosis, non bleeding hemorrhoids - VCE pending   6. ETOH and tobacco abuse - Watch for ETOH withdrawal  7. ID - CT chest with pulmonary edema  - Cough improved with diuresis.  - completed course of azithromycin. - Suspect CHF was cause of cough etc.   8. Tachycardia - HR 130s-140s, ? ST vs 2:1 AFL - check 12 lead EKG - in setting of low volume status, CVP 2-3, after GI prep and being NPO most of yesterday  - hold diuretics, may need gentle IVF hydration    Length of Stay: [id number] PA-C  07/02/2022, 8:09 AM  Advanced Heart Failure Team Pager [phone] (M-F; 7a - 5p)  Please contact [facility] for night-coverage after hours (5p -7a ) and weekends on [email or web address]  Patient seen and examined with the above-signed Advanced Practice Provider and/or Housestaff. I personally reviewed laboratory data, imaging studies and relevant notes. I  independently examined the patient and formulated the important aspects of the plan. I have edited the note to reflect any of my changes or salient points. I have personally discussed the plan with the patient and/or family.  Results on EGD/colon reviewed. No obvious source of bleeding. Capsule in place. Remains on milrinone. Co-ox 56%. CVP remains low. Denies CP or SOB.   General:  Well appearing. No resp difficulty HEENT: normal Neck: supple. no JVD. Carotids 2+ bilat; no bruits. No lymphadenopathy or thryomegaly appreciated. Cor: PMI nondisplaced. Regular tachy  2/6 MR Lungs: clear Abdomen: soft, nontender, nondistended. No hepatosplenomegaly. No bruits or masses. Good bowel sounds. Extremities: no cyanosis, clubbing, rash, edema Neuro: alert & orientedx3, cranial nerves grossly intact. moves all 4 extremities w/o difficulty. Affect pleasant  Hgb stable. No clear source of bleeding.  Awaiting results of capsule. Plan for cMRI today.  ECG with sinus tach.  TCTS following. If hgb stable suspect he will be ready for surgery over the next 1-2 days depending on OR schedule.   Glori Bickers, MD  9:54 AM

## 2022-07-02 NOTE — Plan of Care (Signed)
  Problem: Education: Goal: Knowledge of General Education information will improve Description: Including pain rating scale, medication(s)/side effects and non-pharmacologic comfort measures Outcome: Progressing   Problem: Health Behavior/Discharge Planning: Goal: Ability to manage health-related needs will improve Outcome: Progressing   Problem: Clinical Measurements: Goal: Ability to maintain clinical measurements within normal limits will improve Outcome: Progressing Goal: Cardiovascular complication will be avoided Outcome: Progressing   Problem: Elimination: Goal: Will not experience complications related to bowel motility Outcome: Progressing

## 2022-07-03 ENCOUNTER — Encounter (HOSPITAL_COMMUNITY): Payer: Self-pay | Admitting: Gastroenterology

## 2022-07-03 DIAGNOSIS — I5021 Acute systolic (congestive) heart failure: Secondary | ICD-10-CM

## 2022-07-03 DIAGNOSIS — I340 Nonrheumatic mitral (valve) insufficiency: Secondary | ICD-10-CM

## 2022-07-03 DIAGNOSIS — I2510 Atherosclerotic heart disease of native coronary artery without angina pectoris: Secondary | ICD-10-CM

## 2022-07-03 LAB — PROTIME-INR
INR: 1 (ref 0.8–1.2)
Prothrombin Time: 13.1 seconds (ref 11.4–15.2)

## 2022-07-03 LAB — CBC
HCT: 29.6 % — ABNORMAL LOW (ref 39.0–52.0)
Hemoglobin: 9 g/dL — ABNORMAL LOW (ref 13.0–17.0)
MCH: 23.9 pg — ABNORMAL LOW (ref 26.0–34.0)
MCHC: 30.4 g/dL (ref 30.0–36.0)
MCV: 78.5 fL — ABNORMAL LOW (ref 80.0–100.0)
Platelets: 572 10*3/uL — ABNORMAL HIGH (ref 150–400)
RBC: 3.77 MIL/uL — ABNORMAL LOW (ref 4.22–5.81)
RDW: 20.9 % — ABNORMAL HIGH (ref 11.5–15.5)
WBC: 10 10*3/uL (ref 4.0–10.5)
nRBC: 0 % (ref 0.0–0.2)

## 2022-07-03 LAB — GLUCOSE, CAPILLARY
Glucose-Capillary: 119 mg/dL — ABNORMAL HIGH (ref 70–99)
Glucose-Capillary: 176 mg/dL — ABNORMAL HIGH (ref 70–99)
Glucose-Capillary: 210 mg/dL — ABNORMAL HIGH (ref 70–99)
Glucose-Capillary: 154 mg/dL — ABNORMAL HIGH (ref 70–99)
Glucose-Capillary: 185 mg/dL — ABNORMAL HIGH (ref 70–99)
Glucose-Capillary: 187 mg/dL — ABNORMAL HIGH (ref 70–99)

## 2022-07-03 LAB — URINALYSIS, ROUTINE W REFLEX MICROSCOPIC
Bilirubin Urine: NEGATIVE
Glucose, UA: 150 mg/dL — AB
Hgb urine dipstick: NEGATIVE
Ketones, ur: NEGATIVE mg/dL
Leukocytes,Ua: NEGATIVE
Nitrite: NEGATIVE
Protein, ur: NEGATIVE mg/dL
Specific Gravity, Urine: 1.014 (ref 1.005–1.030)
pH: 6 (ref 5.0–8.0)

## 2022-07-03 LAB — BASIC METABOLIC PANEL
Anion gap: 8 (ref 5–15)
BUN: 10 mg/dL (ref 6–20)
CO2: 26 mmol/L (ref 22–32)
Calcium: 9.6 mg/dL (ref 8.9–10.3)
Chloride: 100 mmol/L (ref 98–111)
Creatinine, Ser: 0.8 mg/dL (ref 0.61–1.24)
GFR, Estimated: >60 mL/min (ref >60)
Glucose, Bld: 237 mg/dL — ABNORMAL HIGH (ref 70–99)
Potassium: 4.7 mmol/L (ref 3.5–5.1)
Sodium: 134 mmol/L — ABNORMAL LOW (ref 135–145)

## 2022-07-03 LAB — BLOOD GAS, ARTERIAL
Acid-Base Excess: 4.9 mmol/L — ABNORMAL HIGH (ref 0.0–2.0)
Bicarbonate: 29.2 mmol/L — ABNORMAL HIGH (ref 20.0–28.0)
Drawn by: 31394
O2 Saturation: 96.7 %
Patient temperature: 36.9
pCO2 arterial: 41 mmHg (ref 32–48)
pH, Arterial: 7.46 — ABNORMAL HIGH (ref 7.35–7.45)
pO2, Arterial: 75 mmHg — ABNORMAL LOW (ref 83–108)

## 2022-07-03 LAB — HEMOGLOBIN A1C
Hgb A1c MFr Bld: 7.9 % — ABNORMAL HIGH (ref 4.8–5.6)
Mean Plasma Glucose: 180.03 mg/dL

## 2022-07-03 LAB — COOXEMETRY PANEL
Carboxyhemoglobin: 1.7 % — ABNORMAL HIGH (ref 0.5–1.5)
Methemoglobin: 1 % (ref 0.0–1.5)
O2 Saturation: 68.9 %
Total hemoglobin: 8.7 g/dL — ABNORMAL LOW (ref 12.0–16.0)

## 2022-07-03 LAB — APTT: aPTT: 32 seconds (ref 24–36)

## 2022-07-03 LAB — DIGOXIN LEVEL: Digoxin Level: <0.2 ng/mL — ABNORMAL LOW (ref 0.8–2.0)

## 2022-07-03 MED ORDER — MILRINONE LACTATE IN DEXTROSE 20-5 MG/100ML-% IV SOLN
0.3000 ug/kg/min | INTRAVENOUS | Status: AC
  Administered 2022-07-04: 0.3 ug/kg/min via INTRAVENOUS
  Filled 2022-07-03: qty 100

## 2022-07-03 MED ORDER — MAGNESIUM SULFATE 50 % IJ SOLN
40.0000 meq | INTRAMUSCULAR | Status: DC
  Filled 2022-07-03: qty 9.85

## 2022-07-03 MED ORDER — METOPROLOL TARTRATE 12.5 MG HALF TABLET
12.5000 mg | ORAL_TABLET | Freq: Once | ORAL | Status: AC
  Administered 2022-07-04: 12.5 mg via ORAL
  Filled 2022-07-03: qty 1

## 2022-07-03 MED ORDER — POTASSIUM CHLORIDE 2 MEQ/ML IV SOLN
80.0000 meq | INTRAVENOUS | Status: DC
  Filled 2022-07-03: qty 40

## 2022-07-03 MED ORDER — SODIUM CHLORIDE 0.9 % IV SOLN
510.0000 mg | Freq: Once | INTRAVENOUS | Status: AC
  Administered 2022-07-03: 510 mg via INTRAVENOUS
  Filled 2022-07-03: qty 17

## 2022-07-03 MED ORDER — CHLORHEXIDINE GLUCONATE 0.12 % MT SOLN
15.0000 mL | Freq: Once | OROMUCOSAL | Status: AC
  Administered 2022-07-04: 15 mL via OROMUCOSAL
  Filled 2022-07-03: qty 15

## 2022-07-03 MED ORDER — INSULIN ASPART 100 UNIT/ML IJ SOLN
7.0000 [IU] | Freq: Three times a day (TID) | INTRAMUSCULAR | Status: DC
  Administered 2022-07-03 (×2): 7 [IU] via SUBCUTANEOUS

## 2022-07-03 MED ORDER — CHLORHEXIDINE GLUCONATE CLOTH 2 % EX PADS
6.0000 | MEDICATED_PAD | Freq: Once | CUTANEOUS | Status: AC
  Administered 2022-07-03: 6 via TOPICAL

## 2022-07-03 MED ORDER — SODIUM CHLORIDE 0.9 % IV SOLN
510.0000 mg | Freq: Once | INTRAVENOUS | Status: DC
  Administered 2022-07-03: 510 mg via INTRAVENOUS
  Filled 2022-07-03: qty 17

## 2022-07-03 MED ORDER — PLASMA-LYTE A IV SOLN
INTRAVENOUS | Status: DC
  Filled 2022-07-03: qty 2.5

## 2022-07-03 MED ORDER — CEFAZOLIN SODIUM-DEXTROSE 2-4 GM/100ML-% IV SOLN
2.0000 g | INTRAVENOUS | Status: AC
  Administered 2022-07-04: 2 g via INTRAVENOUS
  Filled 2022-07-03: qty 100

## 2022-07-03 MED ORDER — EPINEPHRINE HCL 5 MG/250ML IV SOLN IN NS
0.0000 ug/min | INTRAVENOUS | Status: DC
  Filled 2022-07-03: qty 250

## 2022-07-03 MED ORDER — CHLORHEXIDINE GLUCONATE CLOTH 2 % EX PADS
6.0000 | MEDICATED_PAD | Freq: Once | CUTANEOUS | Status: AC
  Administered 2022-07-04: 6 via TOPICAL

## 2022-07-03 MED ORDER — BISACODYL 5 MG PO TBEC
5.0000 mg | DELAYED_RELEASE_TABLET | Freq: Once | ORAL | Status: AC
  Administered 2022-07-03: 5 mg via ORAL
  Filled 2022-07-03: qty 1

## 2022-07-03 MED ORDER — PHENYLEPHRINE HCL-NACL 20-0.9 MG/250ML-% IV SOLN
30.0000 ug/min | INTRAVENOUS | Status: AC
  Administered 2022-07-04: 25 ug/min via INTRAVENOUS
  Filled 2022-07-03: qty 250

## 2022-07-03 MED ORDER — HEPARIN 30,000 UNITS/1000 ML (OHS) CELLSAVER SOLUTION
Status: DC
  Filled 2022-07-03: qty 1000

## 2022-07-03 MED ORDER — BISACODYL 5 MG PO TBEC
5.0000 mg | DELAYED_RELEASE_TABLET | Freq: Once | ORAL | Status: DC
  Filled 2022-07-03: qty 1

## 2022-07-03 MED ORDER — DEXMEDETOMIDINE HCL IN NACL 400 MCG/100ML IV SOLN
0.1000 ug/kg/h | INTRAVENOUS | Status: AC
  Administered 2022-07-04: .4 ug/kg/h via INTRAVENOUS
  Filled 2022-07-03: qty 100

## 2022-07-03 MED ORDER — LACTATED RINGERS IV BOLUS: 500.0000 mL | Freq: Once | INTRAVENOUS | Status: DC

## 2022-07-03 MED ORDER — INSULIN REGULAR(HUMAN) IN NACL 100-0.9 UT/100ML-% IV SOLN
INTRAVENOUS | Status: AC
  Administered 2022-07-04: 2.6 [IU]/h via INTRAVENOUS
  Filled 2022-07-03: qty 100

## 2022-07-03 MED ORDER — VANCOMYCIN HCL 1250 MG/250ML IV SOLN
1250.0000 mg | INTRAVENOUS | Status: AC
  Administered 2022-07-04: 1250 mg via INTRAVENOUS
  Filled 2022-07-03: qty 250

## 2022-07-03 MED ORDER — NITROGLYCERIN IN D5W 200-5 MCG/ML-% IV SOLN
2.0000 ug/min | INTRAVENOUS | Status: DC
  Filled 2022-07-03: qty 250

## 2022-07-03 MED ORDER — TRANEXAMIC ACID 1000 MG/10ML IV SOLN
1.5000 mg/kg/h | INTRAVENOUS | Status: AC
  Administered 2022-07-04: 1.5 mg/kg/h via INTRAVENOUS
  Filled 2022-07-03: qty 25

## 2022-07-03 MED ORDER — INSULIN GLARGINE-YFGN 100 UNIT/ML ~~LOC~~ SOLN
18.0000 [IU] | Freq: Two times a day (BID) | SUBCUTANEOUS | Status: DC
  Filled 2022-07-03 (×2): qty 0.18

## 2022-07-03 MED ORDER — SODIUM CHLORIDE 0.9 % IV BOLUS: 500.0000 mL | Freq: Once | INTRAVENOUS | Status: DC

## 2022-07-03 MED ORDER — NOREPINEPHRINE 4 MG/250ML-% IV SOLN
0.0000 ug/min | INTRAVENOUS | Status: DC
  Filled 2022-07-03: qty 250

## 2022-07-03 MED ORDER — TRANEXAMIC ACID (OHS) BOLUS VIA INFUSION
15.0000 mg/kg | INTRAVENOUS | Status: AC
  Administered 2022-07-04: 1071 mg via INTRAVENOUS
  Filled 2022-07-03: qty 1071

## 2022-07-03 MED ORDER — TRANEXAMIC ACID (OHS) PUMP PRIME SOLUTION
2.0000 mg/kg | INTRAVENOUS | Status: DC
  Filled 2022-07-03: qty 1.43

## 2022-07-03 MED ORDER — LACTATED RINGERS IV SOLN: INTRAVENOUS | Status: AC

## 2022-07-03 NOTE — Progress Notes (Signed)
2 Days Post-Op Procedure(s) (LRB): ESOPHAGOGASTRODUODENOSCOPY (EGD) WITH PROPOFOL (N/A) COLONOSCOPY (N/A) BIOPSY HEMOSTASIS CLIP PLACEMENT POLYPECTOMY Subjective: No complaints cMRI results reviewed. Mild to moderate MR (ischemic etiology) with viable myocardium  Objective: Vital signs in last 24 hours: Temp:  [97.8 F (36.6 C)-98.3 F (36.8 C)] 98.3 F (36.8 C) (09/14 0413) Pulse Rate:  [78-95] 95 (09/13 1942) Cardiac Rhythm: Sinus tachycardia (09/14 0700) Resp:  [16-22] 18 (09/14 0413) BP: (115-132)/(83-92) 115/92 (09/14 0413) SpO2:  [99 %-100 %] 100 % (09/13 1942)  Hemodynamic parameters for last 24 hours: CVP:  [1 mmHg-8 mmHg] 3 mmHg  Intake/Output from previous day: 09/13 0701 - 09/14 0700 In: 681.9 [P.O.:520; I.V.:161.9] Out: 1800 [Urine:1800] Intake/Output this shift: No intake/output data recorded.  Heart: regular rate and rhythm, S1, S2 normal, no murmur, click, rub or gallop  Lab Results: Recent Labs    07/02/22 0535 07/03/22 0550  WBC 8.6 10.0  HGB 9.3* 9.0*  HCT 30.2* 29.6*  PLT 540* 572*   BMET:  Recent Labs    07/02/22 0535 07/03/22 0550  NA 134* 134*  K 4.1 4.7  CL 98 100  CO2 26 26  GLUCOSE 153* 237*  BUN 7 10  CREATININE 0.67 0.80  CALCIUM 9.1 9.6    PT/INR: No results for input(s): "LABPROT", "INR" in the last 72 hours. ABG    Component Value Date/Time   PHART 7.538 (H) 06/25/2022 1518   HCO3 26.1 06/25/2022 1524   TCO2 27 06/25/2022 1524   O2SAT 68.9 07/03/2022 0550   CBG (last 3)  Recent Labs    07/02/22 1546 07/02/22 2121 07/03/22 0627  GLUCAP 119* 176* 210*    Assessment/Plan: SP NSTEMI with ischemic cardiomyopathy and ischemic MR Will schedule for CABG and possible MV surgery tomorrow if no further medical optimization needed Risks and benefits of surgery discussed with pt and he wishes to proceed   S/P Procedure(s) (LRB): ESOPHAGOGASTRODUODENOSCOPY (EGD) WITH PROPOFOL (N/A) COLONOSCOPY  (N/A) BIOPSY HEMOSTASIS CLIP PLACEMENT POLYPECTOMY HCT stable, will follow post op     LOS: 9 days    Coralie Common 07/03/2022

## 2022-07-03 NOTE — Plan of Care (Signed)

## 2022-07-03 NOTE — Progress Notes (Signed)
PROGRESS NOTE    [name]  EYC:[id number] DOB: [birth year] DOA: 06/24/2022 PCP: Charolette Forward, MD  56 yo male with the past medical history of coronary artery disease, hypertension, and dyslipidemia who presented with chest pain.BNP 639, Trop 139, CXR with pulmonary vascular congestion, bilateral interstitial infiltrates, small pleural effusions. -2D echo noted EF of 40-45% with wall motion abnormality, severe MR -TEE moderate to severe MR -Left heart cath 9/6 with advanced multivessel CAD -9/6 started milrinone [facility] course complicated by worsening anemia, transfused PRBC 9/11 -9/12, EGD/colon Skippy with nonobstructing Schatzki ring, hiatal hernia, normal stomach, colonoscopy 2 polyps resected, diverticulosis, capsule study 9/13 negative for bleeding source -Cardiac MRI with EF of 29%, study consistent with multivessel disease, most of myocardium is viable    Subjective: -Feels well, no complaints, waiting for CABG  Assessment and Plan:  Acute hypoxic respiratory failure Acute systolic heart failure (HCC) Severe MR -Echo noted EF 40-45%, with wall motion abnormality, RV function is preserved, severe MR  TEE with moderate to severe mitral regurgitation  -09/06 cardiac catheterization with advance multivessel coronary artery disease.  -Advanced heart failure team following, diuresed with IV Lasix, off diuretics now, remains on milrinone -Remains on Entresto digoxin and Aldactone -Neck MRI completed, study consistent with multivessel CAD, most myocardium appears viable, plan for possible CABG/MVR tomorrow   Multivessel CAD, severe MR NSTEMI  -Troponin peaked at 1477 -LHC with severe multivessel CAD and high-grade left main disease -T CTS consulted -Remains on aspirin statin and heparin GTT, beta-blocker on hold -Plan for CABG/MVR tomorrow   Type 2 diabetes mellitus with hyperlipidemia (HCC) -A1c is 8.2 -Start SGLT2i as tolerated -Hold tonight Semglee dose for  surgery, increase meal coverage   Iron deficiency anemia -Hemoglobin trended down to 7.1, 2 units of PRBC transfused -Anemia panel with iron deficiency, IV iron on hold -GI consulted, EGD/colonoscopy/capsule endoscopy largely unremarkable for any active bleeding, hemoglobin is stable   EtOH abuse   Hypokalemia Hyponatremia. Hypomagnesemia   GERD (gastroesophageal reflux disease) Continue pantoprazole    Tobacco abuse Smoking cessation    ? Left upper lobe pneumonia 09/08 fever 100.4, -Questionable pneumonia on admission imaging -Clinically do not suspect pneumonia, suspect symptoms are primarily related to pulmonary edema, DC antibiotics and monitor       DVT prophylaxis: Heparin SQ Code Status: Full code Family Communication: Discussed patient detail, no family at bedside Disposition Plan: Home pending CABG   Consultants: Cards, TCTS     Procedures: EGD/colonoscopy   Antimicrobials:    Objective: Vitals:   07/02/22 1942 07/03/22 0019 07/03/22 0413 07/03/22 0825  BP: 132/83 (!) 129/91 (!) 115/92 102/68  Pulse: 95   (!) 112  Resp: 16 (!) '22 18 20  '$ Temp: 98.2 F (36.8 C) 98.3 F (36.8 C) 98.3 F (36.8 C) 98.2 F (36.8 C)  TempSrc: Oral Oral Oral Oral  SpO2: 100%   96%  Weight:      Height:        Intake/Output Summary (Last 24 hours) at 07/03/2022 1109 Last data filed at 07/03/2022 0611 Gross per 24 hour  Intake 681.86 ml  Output 1800 ml  Net -1118.14 ml   Filed Weights   07/01/22 0610 07/01/22 1248 07/02/22 0400  Weight: 71.8 kg 72.6 kg 71.4 kg    Examination:  General exam: Appears calm and comfortable  Respiratory system: Clear to auscultation Cardiovascular system: S1 & S2 heard, systolic murmur Abd: nondistended, soft and nontender.Normal bowel sounds heard. Central nervous system: Alert and oriented. No  focal neurological deficits. Extremities: trace edema Skin: No rashes Psychiatry:  Mood & affect appropriate.     Data Reviewed:    CBC: Recent Labs  Lab 06/30/22 0528 06/30/22 1410 07/01/22 0425 07/02/22 0535 07/03/22 0550  WBC 11.4* 8.5 9.2 8.6 10.0  HGB 7.7* 8.7* 8.7* 9.3* 9.0*  HCT 24.6* 28.4* 29.2* 30.2* 29.6*  MCV 76.2* 79.1* 79.1* 78.4* 78.5*  PLT 411* 416* 472* 540* 177*   Basic Metabolic Panel: Recent Labs  Lab 06/27/22 0430 06/28/22 0410 06/29/22 0430 06/30/22 0528 07/01/22 0425 07/02/22 0535 07/03/22 0550  NA 132*   < > 131* 133* 134* 134* 134*  K 3.8   < > 4.1 3.7 4.2 4.1 4.7  CL 97*   < > 94* 96* 101 98 100  CO2 26   < > '27 27 24 26 26  '$ GLUCOSE 269*   < > 344* 217* 133* 153* 237*  BUN 10   < > '12 13 10 7 10  '$ CREATININE 0.81   < > 0.85 0.87 0.74 0.67 0.80  CALCIUM 8.6*   < > 9.1 9.2 9.1 9.1 9.6  MG 1.8  --   --  1.7 2.1 1.8  --    < > = values in this interval not displayed.   GFR: Estimated Creatinine Clearance: 105.4 mL/min (by C-G formula based on SCr of 0.8 mg/dL). Liver Function Tests: Recent Labs  Lab 07/01/22 0425  AST 335*  ALT 231*  ALKPHOS 123  BILITOT 0.7  PROT 7.1  ALBUMIN 2.3*   No results for input(s): "LIPASE", "AMYLASE" in the last 168 hours. No results for input(s): "AMMONIA" in the last 168 hours. Coagulation Profile: No results for input(s): "INR", "PROTIME" in the last 168 hours. Cardiac Enzymes: No results for input(s): "CKTOTAL", "CKMB", "CKMBINDEX", "TROPONINI" in the last 168 hours. BNP (last 3 results) No results for input(s): "PROBNP" in the last 8760 hours. HbA1C: No results for input(s): "HGBA1C" in the last 72 hours. CBG: Recent Labs  Lab 07/02/22 1127 07/02/22 1546 07/02/22 2121 07/03/22 0627 07/03/22 1105  GLUCAP 227* 119* 176* 210* 154*   Lipid Profile: No results for input(s): "CHOL", "HDL", "LDLCALC", "TRIG", "CHOLHDL", "LDLDIRECT" in the last 72 hours. Thyroid Function Tests: No results for input(s): "TSH", "T4TOTAL", "FREET4", "T3FREE", "THYROIDAB" in the last 72 hours. Anemia Panel: No results for input(s): "VITAMINB12",  "FOLATE", "FERRITIN", "TIBC", "IRON", "RETICCTPCT" in the last 72 hours. Urine analysis:    Component Value Date/Time   COLORURINE YELLOW 06/26/2022 2116   APPEARANCEUR CLEAR 06/26/2022 2116   LABSPEC 1.023 06/26/2022 2116   PHURINE 5.0 06/26/2022 2116   GLUCOSEU >=500 (A) 06/26/2022 2116   HGBUR NEGATIVE 06/26/2022 2116   BILIRUBINUR NEGATIVE 06/26/2022 2116   [place] NEGATIVE 06/26/2022 2116   PROTEINUR NEGATIVE 06/26/2022 2116   UROBILINOGEN 1.0 01/30/2015 1029   NITRITE NEGATIVE 06/26/2022 2116   LEUKOCYTESUR NEGATIVE 06/26/2022 2116   Sepsis Labs: '@LABRCNTIP'$ (procalcitonin:4,lacticidven:4)  ) Recent Results (from the past 240 hour(s))  Urine Culture     Status: None   Collection Time: 06/26/22  9:16 PM   Specimen: Urine, Clean Catch  Result Value Ref Range Status   Specimen Description URINE, CLEAN CATCH  Final   Special Requests NONE  Final   Culture   Final    NO GROWTH Performed at [facility], [place] [address] [place], [place] [address]    Report Status 06/29/2022 FINAL  Final  Culture, blood (Routine X 2) w Reflex to ID Panel  Status: None   Collection Time: 06/26/22 10:24 PM   Specimen: BLOOD  Result Value Ref Range Status   Specimen Description BLOOD RIGHT ANTECUBITAL  Final   Special Requests   Final    BOTTLES DRAWN AEROBIC AND ANAEROBIC Blood Culture adequate volume   Culture   Final    NO GROWTH 5 DAYS Performed at [facility], [address], [place], [place] [address]    Report Status 07/01/2022 FINAL  Final  Culture, blood (Routine X 2) w Reflex to ID Panel     Status: None   Collection Time: 06/26/22 10:41 PM   Specimen: BLOOD RIGHT HAND  Result Value Ref Range Status   Specimen Description BLOOD RIGHT HAND  Final   Special Requests   Final    BOTTLES DRAWN AEROBIC AND ANAEROBIC Blood Culture adequate volume   Culture   Final    NO GROWTH 5 DAYS Performed at [facility], [place] [address] [place], [place] [address]     Report Status 07/01/2022 FINAL  Final  Respiratory (~20 pathogens) panel by PCR     Status: None   Collection Time: 06/27/22  7:28 AM   Specimen: Nasopharyngeal Swab; Respiratory  Result Value Ref Range Status   Adenovirus NOT DETECTED NOT DETECTED Final   Coronavirus 229E NOT DETECTED NOT DETECTED Final    Comment: (NOTE) The Coronavirus on the Respiratory Panel, DOES NOT test for the novel  Coronavirus (2019 nCoV)    Coronavirus HKU1 NOT DETECTED NOT DETECTED Final   Coronavirus NL63 NOT DETECTED NOT DETECTED Final   Coronavirus OC43 NOT DETECTED NOT DETECTED Final   Metapneumovirus NOT DETECTED NOT DETECTED Final   Rhinovirus / Enterovirus NOT DETECTED NOT DETECTED Final   Influenza A NOT DETECTED NOT DETECTED Final   Influenza B NOT DETECTED NOT DETECTED Final   Parainfluenza Virus 1 NOT DETECTED NOT DETECTED Final   Parainfluenza Virus 2 NOT DETECTED NOT DETECTED Final   Parainfluenza Virus 3 NOT DETECTED NOT DETECTED Final   Parainfluenza Virus 4 NOT DETECTED NOT DETECTED Final   Respiratory Syncytial Virus NOT DETECTED NOT DETECTED Final   Bordetella pertussis NOT DETECTED NOT DETECTED Final   Bordetella Parapertussis NOT DETECTED NOT DETECTED Final   Chlamydophila pneumoniae NOT DETECTED NOT DETECTED Final   Mycoplasma pneumoniae NOT DETECTED NOT DETECTED Final    Comment: Performed at [facility], [place]. [address], [place], [place] [address]  SARS Coronavirus 2 by RT PCR (hospital order, performed in [facility] hospital lab) *cepheid single result test* Anterior Nasal Swab     Status: None   Collection Time: 06/27/22  7:28 AM   Specimen: Anterior Nasal Swab  Result Value Ref Range Status   SARS Coronavirus 2 by RT PCR NEGATIVE NEGATIVE Final    Comment: (NOTE) SARS-CoV-2 target nucleic acids are NOT DETECTED.  The SARS-CoV-2 RNA is generally detectable in upper and lower respiratory specimens during the acute phase of infection. The lowest concentration of  SARS-CoV-2 viral copies this assay can detect is 250 copies / mL. A negative result does not preclude SARS-CoV-2 infection and should not be used as the sole basis for treatment or other patient management decisions.  A negative result may occur with improper specimen collection / handling, submission of specimen other than nasopharyngeal swab, presence of viral mutation(s) within the areas targeted by this assay, and inadequate number of viral copies (<250 copies / mL). A negative result must be combined with clinical observations,  patient history, and epidemiological information.  Fact Sheet for Patients:   https://www.patel.info/  Fact Sheet for Healthcare Providers: https://hall.com/  This test is not yet approved or  cleared by the [place] FDA and has been authorized for detection and/or diagnosis of SARS-CoV-2 by FDA under an Emergency Use Authorization (EUA).  This EUA will remain in effect (meaning this test can be used) for the duration of the COVID-19 declaration under Section 564(b)(1) of the Act, 21 U.S.C. section 360bbb-3(b)(1), unless the authorization is terminated or revoked sooner.  Performed at [facility], [place] [address] [place], [place] [address]   Surgical pcr screen     Status: None   Collection Time: 07/02/22  5:51 AM   Specimen: Nasal Mucosa; Nasal Swab  Result Value Ref Range Status   MRSA, PCR NEGATIVE NEGATIVE Final   Staphylococcus aureus NEGATIVE NEGATIVE Final    Comment: (NOTE) The Xpert SA Assay (FDA approved for NASAL specimens in patients 67 years of age and older), is one component of a comprehensive surveillance program. It is not intended to diagnose infection nor to guide or monitor treatment. Performed at [facility], [place] [address] [place], [place] [address]      Radiology Studies: MR CARDIAC MORPHOLOGY W WO CONTRAST  Result Date: 07/02/2022 CLINICAL DATA:  Clinical  question of cardiac viability Study assumes  BSA of 1.90 m2. EXAM: CARDIAC MRI TECHNIQUE: The patient was scanned on a 1.5 Tesla GE magnet. A dedicated cardiac coil was used. Functional imaging was done using Fiesta sequences. 2,3, and 4 chamber views were done to assess for RWMA's. Modified Simpson's rule using a short axis stack was used to calculate an ejection fraction on a dedicated work Conservation officer, nature. The patient received 10 cc of Gadavist. After 10 minutes inversion recovery sequences were used to assess for infiltration and scar tissue. CONTRAST:  10 cc  of Gadavist FINDINGS: 1. Moderate dilation in left ventricular size, with LVEDD 56 mm, but LVEDVi 132 mL/m2. Mild asymmetric septal hypertrophy, with intraventricular septal thickness of 13 mm, posterior wall thickness of 6 mm, but myocardial mass index of 84 g/m2. Severely decreased left ventricular systolic function (LVEF =41%). There are regional wall motion abnormalities. Basal inferior, inferoseptal and inferolateral hypokinesis. Mid anteroseptal and mid inferolateral and anterolateral hypokinesis. Severe apical hypokinesis without LV thrombus. Left ventricular parametric mapping notable for increase in native T1 in the apical lateral segment (1200 ms) and basal inferolateral segment (1215 ms). Increased in T2 signal increased basal inferoseptal and inferolateral (61 ms) mid inferior and inferolateral (63 ms), and apical inferior and lateral (70 ms). There is late gadolinium enhancement in the left ventricular myocardium- 75% true apical LGE. 2. Normal right ventricular size with RVEDVI 64 mL/m2. Normal right ventricular thickness. Normal right ventricular systolic function (RVEF =32%). There are no regional wall motion abnormalities or aneurysms. 3.  Normal left and right atrial size. 4. Normal size of the aortic root, ascending aorta and pulmonary artery. 5. Valve assessment: Aortic Valve: Tri-leaflet aortic valve. There is no  significant regurgitation, regurgitant fraction 5%. Pulmonic Valve: There is no significant regurgitation, regurgitant fraction 3%. Tricuspid Valve: There is no significant regurgitation. Mitral Valve: There is mild to moderate mitral regurgitation, regurgitant fraction 23%. Mechanism is likely posterior leaflet restriction. 6.  Normal pericardium.  No pericardial effusion. 7. Grossly, There is a subdiaphragmatic artifact of unclear etiology. Small bilateral pleural effusions. Recommended dedicated study if concerned for non-cardiac pathology. IMPRESSION: Study is consistent  with multi-vessel disease. Save for the left ventricular true apex; most of the myocardium appears viable. Rudean Haskell MD Electronically Signed   By: Rudean Haskell M.D.   On: 07/02/2022 18:14   MR CARDIAC VELOCITY FLOW MAP  Result Date: 07/02/2022 CLINICAL DATA:  Clinical question of cardiac viability Study assumes  BSA of 1.90 m2. EXAM: CARDIAC MRI TECHNIQUE: The patient was scanned on a 1.5 Tesla GE magnet. A dedicated cardiac coil was used. Functional imaging was done using Fiesta sequences. 2,3, and 4 chamber views were done to assess for RWMA's. Modified Simpson's rule using a short axis stack was used to calculate an ejection fraction on a dedicated work Conservation officer, nature. The patient received 10 cc of Gadavist. After 10 minutes inversion recovery sequences were used to assess for infiltration and scar tissue. CONTRAST:  10 cc  of Gadavist FINDINGS: 1. Moderate dilation in left ventricular size, with LVEDD 56 mm, but LVEDVi 132 mL/m2. Mild asymmetric septal hypertrophy, with intraventricular septal thickness of 13 mm, posterior wall thickness of 6 mm, but myocardial mass index of 84 g/m2. Severely decreased left ventricular systolic function (LVEF =96%). There are regional wall motion abnormalities. Basal inferior, inferoseptal and inferolateral hypokinesis. Mid anteroseptal and mid inferolateral and  anterolateral hypokinesis. Severe apical hypokinesis without LV thrombus. Left ventricular parametric mapping notable for increase in native T1 in the apical lateral segment (1200 ms) and basal inferolateral segment (1215 ms). Increased in T2 signal increased basal inferoseptal and inferolateral (61 ms) mid inferior and inferolateral (63 ms), and apical inferior and lateral (70 ms). There is late gadolinium enhancement in the left ventricular myocardium- 75% true apical LGE. 2. Normal right ventricular size with RVEDVI 64 mL/m2. Normal right ventricular thickness. Normal right ventricular systolic function (RVEF =28%). There are no regional wall motion abnormalities or aneurysms. 3.  Normal left and right atrial size. 4. Normal size of the aortic root, ascending aorta and pulmonary artery. 5. Valve assessment: Aortic Valve: Tri-leaflet aortic valve. There is no significant regurgitation, regurgitant fraction 5%. Pulmonic Valve: There is no significant regurgitation, regurgitant fraction 3%. Tricuspid Valve: There is no significant regurgitation. Mitral Valve: There is mild to moderate mitral regurgitation, regurgitant fraction 23%. Mechanism is likely posterior leaflet restriction. 6.  Normal pericardium.  No pericardial effusion. 7. Grossly, There is a subdiaphragmatic artifact of unclear etiology. Small bilateral pleural effusions. Recommended dedicated study if concerned for non-cardiac pathology. IMPRESSION: Study is consistent with multi-vessel disease. Save for the left ventricular true apex; most of the myocardium appears viable. Rudean Haskell MD Electronically Signed   By: Rudean Haskell M.D.   On: 07/02/2022 18:14   DG Abd 2 Views  Result Date: 07/02/2022 CLINICAL DATA:  Pre MRI. Recent pill endoscopy. EXAM: ABDOMEN - 2 VIEW COMPARISON:  CT scan 01/04/2021 FINDINGS: The endoscopy camera is in the central pelvis. It could be any distal small bowel loop or possibly in the sigmoid colon. The  bowel gas pattern is unremarkable. No findings for obstruction perforation. The soft tissue shadows are maintained. The lung bases are clear. Bony structures are unremarkable. IMPRESSION: The endoscopy camera is in the central pelvis. It could be in a distal small bowel loop or sigmoid colon. No findings for bowel obstruction or perforation. Electronically Signed   By: Marijo Sanes M.D.   On: 07/02/2022 09:37     Scheduled Meds:  aspirin EC  81 mg Oral Daily   Chlorhexidine Gluconate Cloth  6 each Topical Daily  digoxin  0.125 mg Oral Daily   feeding supplement  237 mL Oral BID BM   heparin injection (subcutaneous)  5,000 Units Subcutaneous Q8H   insulin aspart  0-15 Units Subcutaneous TID WC   insulin aspart  0-5 Units Subcutaneous QHS   insulin aspart  5 Units Subcutaneous TID WC   insulin glargine-yfgn  18 Units Subcutaneous BID   pantoprazole  40 mg Oral Q0600   rosuvastatin  40 mg Oral Daily   sodium chloride flush  3 mL Intravenous Q12H   spironolactone  25 mg Oral Daily   Continuous Infusions:  sodium chloride     ferumoxytol (FERAHEME) 510 mg in sodium chloride 0.9 % 100 mL IVPB     lactated ringers 125 mL/hr at 07/03/22 1035   milrinone 0.125 mcg/kg/min (07/03/22 0427)     LOS: 9 days    Time spent: 26mn    PDomenic Polite MD [facility]   07/03/2022, 11:09 AM

## 2022-07-03 NOTE — Progress Notes (Signed)
CARDIAC REHAB PHASE I     Pt ambulating well with mobility this morning. Pt is ready for surgery tomorrow. Looking forward to getting it done and beginning the healing process. Reviewed pre [facility] teaching all questions and concerns addressed. Will continue to follow.  [id number]  Vanessa Barbara, RN BSN 07/03/2022 11:37 AM

## 2022-07-03 NOTE — Progress Notes (Addendum)
Mobility Specialist Progress Note    07/03/22 1009  Mobility  Activity Ambulated independently in hallway  Level of Assistance Independent after set-up  Assistive Device None  Distance Ambulated (ft) 1200 ft  Activity Response Tolerated well  $Mobility charge 1 Mobility   Pre-Mobility: 109 HR Post-Mobility: 119 HR  Pt received in chair and agreeable. No complaints on walk. Returned to chair with call bell in reach. Encouraged practicing sternal precautions.    Hildred Alamin Mobility Specialist

## 2022-07-03 NOTE — Plan of Care (Signed)
  Problem: Education: Goal: Knowledge of General Education information will improve Description: Including pain rating scale, medication(s)/side effects and non-pharmacologic comfort measures Outcome: Progressing   Problem: Clinical Measurements: Goal: Diagnostic test results will improve Outcome: Progressing Goal: Cardiovascular complication will be avoided Outcome: Progressing   Problem: Activity: Goal: Risk for activity intolerance will decrease Outcome: Progressing   Problem: Nutrition: Goal: Adequate nutrition will be maintained Outcome: Progressing

## 2022-07-03 NOTE — Anesthesia Preprocedure Evaluation (Signed)
Anesthesia Evaluation  Patient identified by MRN, date of birth, ID band Patient awake    Reviewed: Allergy & Precautions, NPO status , Patient's Chart, lab work & pertinent test results, reviewed documented beta blocker date and time   History of Anesthesia Complications Negative for: history of anesthetic complications  Airway Mallampati: II  TM Distance: >3 FB Neck ROM: Full    Dental  (+) Dental Advisory Given   Pulmonary sleep apnea , Current Smoker and Patient abstained from smoking.,    Pulmonary exam normal        Cardiovascular hypertension, Pt. on medications and Pt. on home beta blockers + CAD, + Past MI and +CHF  Normal cardiovascular exam+ Valvular Problems/Murmurs MR    '23 TEE - EF 40 to 45%. There is mild left ventricular hypertrophy. There is severe hypokinesis of the left ventricular, mid-apical anterior wall, anterolateral wall and apical segment. There is moderate hypokinesis of the left ventricular, basal-mid inferior wall and inferolateral wall. Left atrial size was severely dilated. Tethering of the posterior leaflet is suspected. Moderate to severe mitral valve regurgitation.   '23 Cath - .  Mid LM to Dist LM lesion is 75% stenosed. Colon Flattery Cx to Prox Cx lesion is 90% stenosed. .  Mid RCA lesion is 80% stenosed. .  Prox RCA lesion is 50% stenosed. .  Mid LAD lesion is 75% stenosed. .  Mid Cx to Dist Cx lesion is 99% stenosed. .  3rd Mrg lesion is 90% stenosed. .  Ramus lesion is 80% stenosed. Jorene Minors LAD lesion is 70% stenosed.    Neuro/Psych negative neurological ROS  negative psych ROS   GI/Hepatic hiatal hernia, GERD  Controlled,(+)     substance abuse  alcohol use,  AST 335, ALT 231 on 9/12    Endo/Other  diabetes, Type 2, Oral Hypoglycemic Agents, Insulin Dependent  Renal/GU negative Renal ROS     Musculoskeletal negative musculoskeletal ROS (+)   Abdominal   Peds   Hematology  (+) Blood dyscrasia, anemia ,   Anesthesia Other Findings   Reproductive/Obstetrics                            Anesthesia Physical Anesthesia Plan  ASA: 4  Anesthesia Plan: General   Post-op Pain Management:    Induction: Intravenous  PONV Risk Score and Plan: 2 and Treatment may vary due to age or medical condition  Airway Management Planned: Oral ETT  Additional Equipment: Arterial line, CVP, TEE, Ultrasound Guidance Line Placement and PA Cath  Intra-op Plan:   Post-operative Plan: Post-operative intubation/ventilation  Informed Consent: I have reviewed the patients History and Physical, chart, labs and discussed the procedure including the risks, benefits and alternatives for the proposed anesthesia with the patient or authorized representative who has indicated his/her understanding and acceptance.     Dental advisory given  Plan Discussed with: CRNA, Anesthesiologist and Surgeon  Anesthesia Plan Comments:       Anesthesia Quick Evaluation

## 2022-07-03 NOTE — Progress Notes (Addendum)
Patient ID: [name], male   DOB: 03-Apr-1966, 56 y.o.   MRN: [id number]   Advanced Heart Failure Rounding Note  PCP-Cardiologist: None   Subjective:    09/06: Started milrinone 0.125 to facilitate diuresis in setting of low-output 09/07: Milrinone increased to 0.25. 09/09: Milrinone decreased to 0.125  09/11: 1uPRBc  9/12: EGD/ [facility]>>Non-obstructing Schatzki ring, hiatal hernia, normal stomach, 2 polyps (resected, path pending), diverticulosis, non bleeding hemorrhoids. VCE pending . 9/13: VCE- no active bleeding, no AVMs, ulcers, mass.  On Milrinone 0.125. Co-ox 69% today.   Hgb stable, 9  cMRI showed: LVEF: 29%. Study consistent with multi-vessel disease. Most of the myocardium appears viable.  Tachycardic since yesterday, EKG showed ST 106. Volume status low, CVP 1-2. -1.8L UOP. -2.5lbs. Cr. Pending.   Feels well today, up in chair. Denies dizziness, SOB and CP. Encouraged to increase PO intake a little.   Objective:   Weight Range: 71.4 kg Body mass index is 21.35 kg/m.   Vital Signs:   Temp:  [97.8 F (36.6 C)-98.3 F (36.8 C)] 98.3 F (36.8 C) (09/14 0413) Pulse Rate:  [78-95] 95 (09/13 1942) Resp:  [14-22] 18 (09/14 0413) BP: (115-132)/(69-92) 115/92 (09/14 0413) SpO2:  [95 %-100 %] 100 % (09/13 1942) Last BM Date : 07/01/22  Weight change: Filed Weights   07/01/22 0610 07/01/22 1248 07/02/22 0400  Weight: 71.8 kg 72.6 kg 71.4 kg    Intake/Output:   Intake/Output Summary (Last 24 hours) at 07/03/2022 0705 Last data filed at 07/03/2022 0611 Gross per 24 hour  Intake 681.86 ml  Output 1800 ml  Net -1118.14 ml     Physical Exam  General:  well appearing, in chair. No respiratory difficulty HEENT: normal Neck: supple. No JVD. Carotids 2+ bilat; no bruits. No lymphadenopathy or thyromegaly appreciated. Cor: PMI nondisplaced. Regular rate & rhythm. No rubs, gallops or murmurs. Lungs: clear Abdomen: soft, nontender, nondistended. No  hepatosplenomegaly. No bruits or masses. Good bowel sounds. Extremities: no cyanosis, clubbing, rash, edema R IJ CVC Neuro: alert & oriented x 3, cranial nerves grossly intact. moves all 4 extremities w/o difficulty. Affect pleasant.   Telemetry   ST 120's  Labs    CBC Recent Labs    07/01/22 0425 07/02/22 0535  WBC 9.2 8.6  HGB 8.7* 9.3*  HCT 29.2* 30.2*  MCV 79.1* 78.4*  PLT 472* 144*   Basic Metabolic Panel Recent Labs    07/01/22 0425 07/02/22 0535  NA 134* 134*  K 4.2 4.1  CL 101 98  CO2 24 26  GLUCOSE 133* 153*  BUN 10 7  CREATININE 0.74 0.67  CALCIUM 9.1 9.1  MG 2.1 1.8   Liver Function Tests Recent Labs    07/01/22 0425  AST 335*  ALT 231*  ALKPHOS 123  BILITOT 0.7  PROT 7.1  ALBUMIN 2.3*   No results for input(s): "LIPASE", "AMYLASE" in the last 72 hours. Cardiac Enzymes No results for input(s): "CKTOTAL", "CKMB", "CKMBINDEX", "TROPONINI" in the last 72 hours.  BNP: BNP (last 3 results) Recent Labs    06/24/22 0809 06/25/22 0133  BNP 639.0* 830.5*    ProBNP (last 3 results) No results for input(s): "PROBNP" in the last 8760 hours.   D-Dimer No results for input(s): "DDIMER" in the last 72 hours. Hemoglobin A1C No results for input(s): "HGBA1C" in the last 72 hours.  Fasting Lipid Panel No results for input(s): "CHOL", "HDL", "LDLCALC", "TRIG", "CHOLHDL", "LDLDIRECT" in the last 72 hours.  Thyroid Function Tests No  results for input(s): "TSH", "T4TOTAL", "T3FREE", "THYROIDAB" in the last 72 hours.  Invalid input(s): "FREET3"  Other results:   Imaging    MR CARDIAC MORPHOLOGY W WO CONTRAST  Result Date: 07/02/2022 CLINICAL DATA:  Clinical question of cardiac viability Study assumes  BSA of 1.90 m2. EXAM: CARDIAC MRI TECHNIQUE: The patient was scanned on a 1.5 Tesla GE magnet. A dedicated cardiac coil was used. Functional imaging was done using Fiesta sequences. 2,3, and 4 chamber views were done to assess for RWMA's. Modified  Simpson's rule using a short axis stack was used to calculate an ejection fraction on a dedicated work Conservation officer, nature. The patient received 10 cc of Gadavist. After 10 minutes inversion recovery sequences were used to assess for infiltration and scar tissue. CONTRAST:  10 cc  of Gadavist FINDINGS: 1. Moderate dilation in left ventricular size, with LVEDD 56 mm, but LVEDVi 132 mL/m2. Mild asymmetric septal hypertrophy, with intraventricular septal thickness of 13 mm, posterior wall thickness of 6 mm, but myocardial mass index of 84 g/m2. Severely decreased left ventricular systolic function (LVEF =53%). There are regional wall motion abnormalities. Basal inferior, inferoseptal and inferolateral hypokinesis. Mid anteroseptal and mid inferolateral and anterolateral hypokinesis. Severe apical hypokinesis without LV thrombus. Left ventricular parametric mapping notable for increase in native T1 in the apical lateral segment (1200 ms) and basal inferolateral segment (1215 ms). Increased in T2 signal increased basal inferoseptal and inferolateral (61 ms) mid inferior and inferolateral (63 ms), and apical inferior and lateral (70 ms). There is late gadolinium enhancement in the left ventricular myocardium- 75% true apical LGE. 2. Normal right ventricular size with RVEDVI 64 mL/m2. Normal right ventricular thickness. Normal right ventricular systolic function (RVEF =61%). There are no regional wall motion abnormalities or aneurysms. 3.  Normal left and right atrial size. 4. Normal size of the aortic root, ascending aorta and pulmonary artery. 5. Valve assessment: Aortic Valve: Tri-leaflet aortic valve. There is no significant regurgitation, regurgitant fraction 5%. Pulmonic Valve: There is no significant regurgitation, regurgitant fraction 3%. Tricuspid Valve: There is no significant regurgitation. Mitral Valve: There is mild to moderate mitral regurgitation, regurgitant fraction 23%. Mechanism is likely  posterior leaflet restriction. 6.  Normal pericardium.  No pericardial effusion. 7. Grossly, There is a subdiaphragmatic artifact of unclear etiology. Small bilateral pleural effusions. Recommended dedicated study if concerned for non-cardiac pathology. IMPRESSION: Study is consistent with multi-vessel disease. Save for the left ventricular true apex; most of the myocardium appears viable. Rudean Haskell MD Electronically Signed   By: Rudean Haskell M.D.   On: 07/02/2022 18:14   MR CARDIAC VELOCITY FLOW MAP  Result Date: 07/02/2022 CLINICAL DATA:  Clinical question of cardiac viability Study assumes  BSA of 1.90 m2. EXAM: CARDIAC MRI TECHNIQUE: The patient was scanned on a 1.5 Tesla GE magnet. A dedicated cardiac coil was used. Functional imaging was done using Fiesta sequences. 2,3, and 4 chamber views were done to assess for RWMA's. Modified Simpson's rule using a short axis stack was used to calculate an ejection fraction on a dedicated work Conservation officer, nature. The patient received 10 cc of Gadavist. After 10 minutes inversion recovery sequences were used to assess for infiltration and scar tissue. CONTRAST:  10 cc  of Gadavist FINDINGS: 1. Moderate dilation in left ventricular size, with LVEDD 56 mm, but LVEDVi 132 mL/m2. Mild asymmetric septal hypertrophy, with intraventricular septal thickness of 13 mm, posterior wall thickness of 6 mm, but myocardial mass index of 84  g/m2. Severely decreased left ventricular systolic function (LVEF =58%). There are regional wall motion abnormalities. Basal inferior, inferoseptal and inferolateral hypokinesis. Mid anteroseptal and mid inferolateral and anterolateral hypokinesis. Severe apical hypokinesis without LV thrombus. Left ventricular parametric mapping notable for increase in native T1 in the apical lateral segment (1200 ms) and basal inferolateral segment (1215 ms). Increased in T2 signal increased basal inferoseptal and inferolateral (61  ms) mid inferior and inferolateral (63 ms), and apical inferior and lateral (70 ms). There is late gadolinium enhancement in the left ventricular myocardium- 75% true apical LGE. 2. Normal right ventricular size with RVEDVI 64 mL/m2. Normal right ventricular thickness. Normal right ventricular systolic function (RVEF =09%). There are no regional wall motion abnormalities or aneurysms. 3.  Normal left and right atrial size. 4. Normal size of the aortic root, ascending aorta and pulmonary artery. 5. Valve assessment: Aortic Valve: Tri-leaflet aortic valve. There is no significant regurgitation, regurgitant fraction 5%. Pulmonic Valve: There is no significant regurgitation, regurgitant fraction 3%. Tricuspid Valve: There is no significant regurgitation. Mitral Valve: There is mild to moderate mitral regurgitation, regurgitant fraction 23%. Mechanism is likely posterior leaflet restriction. 6.  Normal pericardium.  No pericardial effusion. 7. Grossly, There is a subdiaphragmatic artifact of unclear etiology. Small bilateral pleural effusions. Recommended dedicated study if concerned for non-cardiac pathology. IMPRESSION: Study is consistent with multi-vessel disease. Save for the left ventricular true apex; most of the myocardium appears viable. Rudean Haskell MD Electronically Signed   By: Rudean Haskell M.D.   On: 07/02/2022 18:14   DG Abd 2 Views  Result Date: 07/02/2022 CLINICAL DATA:  Pre MRI. Recent pill endoscopy. EXAM: ABDOMEN - 2 VIEW COMPARISON:  CT scan 01/04/2021 FINDINGS: The endoscopy camera is in the central pelvis. It could be any distal small bowel loop or possibly in the sigmoid colon. The bowel gas pattern is unremarkable. No findings for obstruction perforation. The soft tissue shadows are maintained. The lung bases are clear. Bony structures are unremarkable. IMPRESSION: The endoscopy camera is in the central pelvis. It could be in a distal small bowel loop or sigmoid colon. No  findings for bowel obstruction or perforation. Electronically Signed   By: Marijo Sanes M.D.   On: 07/02/2022 09:37     Medications:     Scheduled Medications:  aspirin EC  81 mg Oral Daily   Chlorhexidine Gluconate Cloth  6 each Topical Daily   digoxin  0.125 mg Oral Daily   feeding supplement  237 mL Oral BID BM   heparin injection (subcutaneous)  5,000 Units Subcutaneous Q8H   insulin aspart  0-15 Units Subcutaneous TID WC   insulin aspart  0-5 Units Subcutaneous QHS   insulin aspart  5 Units Subcutaneous TID WC   insulin glargine-yfgn  18 Units Subcutaneous BID   pantoprazole  40 mg Oral Q0600   rosuvastatin  40 mg Oral Daily   sacubitril-valsartan  1 tablet Oral BID   sodium chloride flush  3 mL Intravenous Q12H   spironolactone  25 mg Oral Daily    Infusions:  sodium chloride     milrinone 0.125 mcg/kg/min (07/03/22 0427)    PRN Medications: sodium chloride, acetaminophen, guaiFENesin-dextromethorphan, LORazepam, ondansetron (ZOFRAN) IV, mouth rinse, oxyCODONE, sodium chloride flush    Patient Profile   56 y/o AAM w/ h/o multivessel CAD s/p remote MI in 2012 and multiple PCIs to LAD, diag, ramus and OM vessels, HTN, HLD, Type 2 DM and tobacco use, admitted for NSTEMI and acute CHF.  2D Echo and TEE w/ severe mitral regurgitation. LVEF 40-45%, RV ok. Cath w/ multivessel CAD including high grade LM disease, elevated filling pressures and preserved CO.    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  - cMRI 9/13: LVEF: 29%. Study consistent with multi-vessel disease. Most of the myocardium appears viable. - TCTS consulted  2.  Acute on chronic systolic CHF/Ischemic cardiomyopathy - Echo with EF 40-45%.  - RHC w/ RA 8, PA 48/23 (38), PCWP mean 26, LVEDP 21,  PA sat 49% - Today, co-ox 69%  CVP 1-2   - Continue digoxin 0.125.  - Keep milrinone at 0.125 to maintain hemodynamic stability while making decisions on forward path.  - No diuretic today. Cr. Pending, if increased will give IVFs - Continue Entresto 49/51 bid.   - Continue spironolactone 25 mg daily.  - No beta blocker for now with low-output  3. NSTEMI w/ Multivessel CAD  - Known hx prior CAD with MI in 2012 and prior multivessel PCI - HS trop peak 1,477 - LHC w/ severe MVCAD including high grade LM disease  - currently stable w/o CP  - ASA + high intensity statin - Think best path forwards would be CABG-MVR.   4. Type 2DM  - per primary team  - A1c 8.2%  - Eventual SGLT2i - Will need good control of blood sugars if goes for CABG   5. IDA - Hgb 8.4---> 7.4 --> 7.1 -> 1 unit PRBCs -> 7.9-> 8.7->9.3-> 9 today - no obvious source blood loss, FOBT negative.  - Iron  21, sats 4%, ok for IV feraheme - GI consulted. EGD/ [facility]>>Non-obstructing Schatzki ring, hiatal hernia, normal stomach, 2 polyps (resected, path pending), diverticulosis, non bleeding hemorrhoids - VCE: no active bleeding, no AVMs, ulcers, mass.  6. ETOH and tobacco abuse - Watch for ETOH withdrawal  7. ID - CT chest with pulmonary edema  - Cough improved with diuresis.  - completed course of azithromycin. - Suspect CHF was cause of cough etc.   8. Tachycardia - 9/13 HR 130s-140s, ? ST vs 2:1 AFL - 12 lead EKG showed ST 106 bpm - in setting of low volume status, CVP 1-2, after GI prep and being NPO/Cl liq diet last 2 days - hold diuretics, may need gentle IVF hydration. Cr, pending   Length of Stay: [address], AGACNP-BC  07/03/2022, 7:05 AM  Advanced Heart Failure Team Pager [phone] (M-F; 7a - 5p)  Please contact [facility] for night-coverage after hours (5p -7a ) and weekends on [email or web address]  Patient seen and examined with the above-signed Advanced Practice Provider and/or Housestaff. I personally reviewed laboratory data,  imaging studies and relevant notes. I independently examined the patient and formulated the important aspects of the plan. I have edited the note to reflect any of my changes or salient points. I have personally discussed the plan with the patient and/or family.  cMRI reviewed. EF 29% with viable myocardium. Mild to moderate MR   No further bleeding. GI w/u negative.   Denies CP or SOB. Remains tachycardic. Co-ox 69%  General:  Well appearing. No resp difficulty HEENT: normal Neck: supple. no JVD. Carotids 2+ bilat; no bruits. No lymphadenopathy or thryomegaly appreciated. Cor: PMI nondisplaced. Regular tachy  soft MR Lungs: clear Abdomen: soft, nontender, nondistended. No  hepatosplenomegaly. No bruits or masses. Good bowel sounds. Extremities: no cyanosis, clubbing, rash, edema Neuro: alert & orientedx3, cranial nerves grossly intact. moves all 4 extremities w/o difficulty. Affect pleasant  Continue milrinone. Agree with pan for CABG.MVR tomorrow. Hold diuretics. Liberalize po. Will give 500 cc LR to see if it helps with tachycardia. D/w TCTS.   Glori Bickers, MD  8:14 AM

## 2022-07-04 ENCOUNTER — Inpatient Hospital Stay (HOSPITAL_COMMUNITY): Payer: No Typology Code available for payment source

## 2022-07-04 ENCOUNTER — Inpatient Hospital Stay (HOSPITAL_COMMUNITY): Payer: No Typology Code available for payment source | Admitting: General Practice

## 2022-07-04 ENCOUNTER — Inpatient Hospital Stay (HOSPITAL_COMMUNITY)
Admission: EM | Disposition: A | Discharge status: Home / Self Care | Payer: Self-pay | Source: Home / Self Care | Attending: Internal Medicine

## 2022-07-04 ENCOUNTER — Other Ambulatory Visit: Payer: Self-pay

## 2022-07-04 DIAGNOSIS — I214 Non-ST elevation (NSTEMI) myocardial infarction: Secondary | ICD-10-CM

## 2022-07-04 DIAGNOSIS — I5021 Acute systolic (congestive) heart failure: Secondary | ICD-10-CM

## 2022-07-04 DIAGNOSIS — I2510 Atherosclerotic heart disease of native coronary artery without angina pectoris: Secondary | ICD-10-CM

## 2022-07-04 DIAGNOSIS — I255 Ischemic cardiomyopathy: Secondary | ICD-10-CM

## 2022-07-04 DIAGNOSIS — I5023 Acute on chronic systolic (congestive) heart failure: Secondary | ICD-10-CM

## 2022-07-04 DIAGNOSIS — I340 Nonrheumatic mitral (valve) insufficiency: Secondary | ICD-10-CM

## 2022-07-04 DIAGNOSIS — Z951 Presence of aortocoronary bypass graft: Secondary | ICD-10-CM

## 2022-07-04 DIAGNOSIS — I110 Hypertensive heart disease with heart failure: Secondary | ICD-10-CM

## 2022-07-04 DIAGNOSIS — F17210 Nicotine dependence, cigarettes, uncomplicated: Secondary | ICD-10-CM

## 2022-07-04 HISTORY — PX: TEE WITHOUT CARDIOVERSION: SHX5443

## 2022-07-04 HISTORY — PX: CORONARY ARTERY BYPASS GRAFT: SHX141

## 2022-07-04 LAB — CBC
HCT: 27.9 % — ABNORMAL LOW (ref 39.0–52.0)
HCT: 21.3 % — ABNORMAL LOW (ref 39.0–52.0)
HCT: 21 % — ABNORMAL LOW (ref 39.0–52.0)
HCT: 22.1 % — ABNORMAL LOW (ref 39.0–52.0)
Hemoglobin: 8.5 g/dL — ABNORMAL LOW (ref 13.0–17.0)
Hemoglobin: 6.5 g/dL — CL (ref 13.0–17.0)
Hemoglobin: 6.8 g/dL — CL (ref 13.0–17.0)
Hemoglobin: 7 g/dL — ABNORMAL LOW (ref 13.0–17.0)
MCH: 24.1 pg — ABNORMAL LOW (ref 26.0–34.0)
MCH: 24.1 pg — ABNORMAL LOW (ref 26.0–34.0)
MCH: 26.1 pg (ref 26.0–34.0)
MCH: 25.5 pg — ABNORMAL LOW (ref 26.0–34.0)
MCHC: 30.5 g/dL (ref 30.0–36.0)
MCHC: 30.5 g/dL (ref 30.0–36.0)
MCHC: 32.4 g/dL (ref 30.0–36.0)
MCHC: 31.7 g/dL (ref 30.0–36.0)
MCV: 79 fL — ABNORMAL LOW (ref 80.0–100.0)
MCV: 78.9 fL — ABNORMAL LOW (ref 80.0–100.0)
MCV: 80.5 fL (ref 80.0–100.0)
MCV: 80.4 fL (ref 80.0–100.0)
Platelets: 560 10*3/uL — ABNORMAL HIGH (ref 150–400)
Platelets: 390 10*3/uL (ref 150–400)
Platelets: 304 10*3/uL (ref 150–400)
Platelets: 344 10*3/uL (ref 150–400)
RBC: 3.53 MIL/uL — ABNORMAL LOW (ref 4.22–5.81)
RBC: 2.7 MIL/uL — ABNORMAL LOW (ref 4.22–5.81)
RBC: 2.61 MIL/uL — ABNORMAL LOW (ref 4.22–5.81)
RBC: 2.75 MIL/uL — ABNORMAL LOW (ref 4.22–5.81)
RDW: 20.8 % — ABNORMAL HIGH (ref 11.5–15.5)
RDW: 21 % — ABNORMAL HIGH (ref 11.5–15.5)
RDW: 19.5 % — ABNORMAL HIGH (ref 11.5–15.5)
RDW: 19.5 % — ABNORMAL HIGH (ref 11.5–15.5)
WBC: 9 10*3/uL (ref 4.0–10.5)
WBC: 20 10*3/uL — ABNORMAL HIGH (ref 4.0–10.5)
WBC: 13.1 10*3/uL — ABNORMAL HIGH (ref 4.0–10.5)
WBC: 14.1 10*3/uL — ABNORMAL HIGH (ref 4.0–10.5)
nRBC: 0 % (ref 0.0–0.2)
nRBC: 0 % (ref 0.0–0.2)
nRBC: 0 % (ref 0.0–0.2)
nRBC: 0 % (ref 0.0–0.2)

## 2022-07-04 LAB — POCT I-STAT, CHEM 8
BUN: 7 mg/dL (ref 6–20)
BUN: 6 mg/dL (ref 6–20)
BUN: 6 mg/dL (ref 6–20)
BUN: 5 mg/dL — ABNORMAL LOW (ref 6–20)
BUN: 6 mg/dL (ref 6–20)
BUN: 6 mg/dL (ref 6–20)
Calcium, Ion: 1.34 mmol/L (ref 1.15–1.40)
Calcium, Ion: 1.33 mmol/L (ref 1.15–1.40)
Calcium, Ion: 1.15 mmol/L (ref 1.15–1.40)
Calcium, Ion: 1.12 mmol/L — ABNORMAL LOW (ref 1.15–1.40)
Calcium, Ion: 1.24 mmol/L (ref 1.15–1.40)
Calcium, Ion: 1.3 mmol/L (ref 1.15–1.40)
Chloride: 96 mmol/L — ABNORMAL LOW (ref 98–111)
Chloride: 96 mmol/L — ABNORMAL LOW (ref 98–111)
Chloride: 96 mmol/L — ABNORMAL LOW (ref 98–111)
Chloride: 100 mmol/L (ref 98–111)
Chloride: 98 mmol/L (ref 98–111)
Chloride: 99 mmol/L (ref 98–111)
Creatinine, Ser: 0.5 mg/dL — ABNORMAL LOW (ref 0.61–1.24)
Creatinine, Ser: 0.5 mg/dL — ABNORMAL LOW (ref 0.61–1.24)
Creatinine, Ser: 0.4 mg/dL — ABNORMAL LOW (ref 0.61–1.24)
Creatinine, Ser: 0.3 mg/dL — ABNORMAL LOW (ref 0.61–1.24)
Creatinine, Ser: 0.4 mg/dL — ABNORMAL LOW (ref 0.61–1.24)
Creatinine, Ser: 0.4 mg/dL — ABNORMAL LOW (ref 0.61–1.24)
Glucose, Bld: 130 mg/dL — ABNORMAL HIGH (ref 70–99)
Glucose, Bld: 131 mg/dL — ABNORMAL HIGH (ref 70–99)
Glucose, Bld: 123 mg/dL — ABNORMAL HIGH (ref 70–99)
Glucose, Bld: 109 mg/dL — ABNORMAL HIGH (ref 70–99)
Glucose, Bld: 140 mg/dL — ABNORMAL HIGH (ref 70–99)
Glucose, Bld: 141 mg/dL — ABNORMAL HIGH (ref 70–99)
HCT: 30 % — ABNORMAL LOW (ref 39.0–52.0)
HCT: 25 % — ABNORMAL LOW (ref 39.0–52.0)
HCT: 22 % — ABNORMAL LOW (ref 39.0–52.0)
HCT: 20 % — ABNORMAL LOW (ref 39.0–52.0)
HCT: 21 % — ABNORMAL LOW (ref 39.0–52.0)
HCT: 23 % — ABNORMAL LOW (ref 39.0–52.0)
Hemoglobin: 10.2 g/dL — ABNORMAL LOW (ref 13.0–17.0)
Hemoglobin: 8.5 g/dL — ABNORMAL LOW (ref 13.0–17.0)
Hemoglobin: 7.5 g/dL — ABNORMAL LOW (ref 13.0–17.0)
Hemoglobin: 6.8 g/dL — CL (ref 13.0–17.0)
Hemoglobin: 7.1 g/dL — ABNORMAL LOW (ref 13.0–17.0)
Hemoglobin: 7.8 g/dL — ABNORMAL LOW (ref 13.0–17.0)
Potassium: 4.5 mmol/L (ref 3.5–5.1)
Potassium: 4.5 mmol/L (ref 3.5–5.1)
Potassium: 6.3 mmol/L (ref 3.5–5.1)
Potassium: 5.6 mmol/L — ABNORMAL HIGH (ref 3.5–5.1)
Potassium: 6.5 mmol/L (ref 3.5–5.1)
Potassium: 6.7 mmol/L (ref 3.5–5.1)
Sodium: 135 mmol/L (ref 135–145)
Sodium: 133 mmol/L — ABNORMAL LOW (ref 135–145)
Sodium: 132 mmol/L — ABNORMAL LOW (ref 135–145)
Sodium: 133 mmol/L — ABNORMAL LOW (ref 135–145)
Sodium: 134 mmol/L — ABNORMAL LOW (ref 135–145)
Sodium: 135 mmol/L (ref 135–145)
TCO2: 30 mmol/L (ref 22–32)
TCO2: 28 mmol/L (ref 22–32)
TCO2: 27 mmol/L (ref 22–32)
TCO2: 27 mmol/L (ref 22–32)
TCO2: 28 mmol/L (ref 22–32)
TCO2: 29 mmol/L (ref 22–32)

## 2022-07-04 LAB — CBC WITH DIFFERENTIAL/PLATELET
Abs Immature Granulocytes: 0.06 10*3/uL (ref 0.00–0.07)
Basophils Absolute: 0 10*3/uL (ref 0.0–0.1)
Basophils Relative: 0 %
Eosinophils Absolute: 0 10*3/uL (ref 0.0–0.5)
Eosinophils Relative: 0 %
HCT: 25.5 % — ABNORMAL LOW (ref 39.0–52.0)
Hemoglobin: 7.9 g/dL — ABNORMAL LOW (ref 13.0–17.0)
Immature Granulocytes: 0 %
Lymphocytes Relative: 6 %
Lymphs Abs: 0.9 10*3/uL (ref 0.7–4.0)
MCH: 25.5 pg — ABNORMAL LOW (ref 26.0–34.0)
MCHC: 31 g/dL (ref 30.0–36.0)
MCV: 82.3 fL (ref 80.0–100.0)
Monocytes Absolute: 1.4 10*3/uL — ABNORMAL HIGH (ref 0.1–1.0)
Monocytes Relative: 10 %
Neutro Abs: 11.7 10*3/uL — ABNORMAL HIGH (ref 1.7–7.7)
Neutrophils Relative %: 84 %
Platelets: 348 10*3/uL (ref 150–400)
RBC: 3.1 MIL/uL — ABNORMAL LOW (ref 4.22–5.81)
RDW: 18.5 % — ABNORMAL HIGH (ref 11.5–15.5)
WBC: 14.1 10*3/uL — ABNORMAL HIGH (ref 4.0–10.5)
nRBC: 0 % (ref 0.0–0.2)

## 2022-07-04 LAB — BASIC METABOLIC PANEL
Anion gap: 9 (ref 5–15)
Anion gap: 4 — ABNORMAL LOW (ref 5–15)
BUN: 10 mg/dL (ref 6–20)
BUN: 7 mg/dL (ref 6–20)
CO2: 28 mmol/L (ref 22–32)
CO2: 25 mmol/L (ref 22–32)
Calcium: 10.1 mg/dL (ref 8.9–10.3)
Calcium: 8.7 mg/dL — ABNORMAL LOW (ref 8.9–10.3)
Chloride: 97 mmol/L — ABNORMAL LOW (ref 98–111)
Chloride: 107 mmol/L (ref 98–111)
Creatinine, Ser: 0.76 mg/dL (ref 0.61–1.24)
Creatinine, Ser: 0.63 mg/dL (ref 0.61–1.24)
GFR, Estimated: >60 mL/min (ref >60)
GFR, Estimated: >60 mL/min (ref >60)
Glucose, Bld: 189 mg/dL — ABNORMAL HIGH (ref 70–99)
Glucose, Bld: 92 mg/dL (ref 70–99)
Potassium: 4.7 mmol/L (ref 3.5–5.1)
Potassium: 5 mmol/L (ref 3.5–5.1)
Sodium: 134 mmol/L — ABNORMAL LOW (ref 135–145)
Sodium: 136 mmol/L (ref 135–145)

## 2022-07-04 LAB — POCT I-STAT 7, (LYTES, BLD GAS, ICA,H+H)
Acid-Base Excess: 6 mmol/L — ABNORMAL HIGH (ref 0.0–2.0)
Acid-Base Excess: 5 mmol/L — ABNORMAL HIGH (ref 0.0–2.0)
Acid-Base Excess: 5 mmol/L — ABNORMAL HIGH (ref 0.0–2.0)
Acid-Base Excess: 6 mmol/L — ABNORMAL HIGH (ref 0.0–2.0)
Acid-Base Excess: 0 mmol/L (ref 0.0–2.0)
Acid-Base Excess: 0 mmol/L (ref 0.0–2.0)
Bicarbonate: 32.2 mmol/L — ABNORMAL HIGH (ref 20.0–28.0)
Bicarbonate: 28.5 mmol/L — ABNORMAL HIGH (ref 20.0–28.0)
Bicarbonate: 29.8 mmol/L — ABNORMAL HIGH (ref 20.0–28.0)
Bicarbonate: 30.4 mmol/L — ABNORMAL HIGH (ref 20.0–28.0)
Bicarbonate: 24.7 mmol/L (ref 20.0–28.0)
Bicarbonate: 25.9 mmol/L (ref 20.0–28.0)
Calcium, Ion: 1.38 mmol/L (ref 1.15–1.40)
Calcium, Ion: 1.1 mmol/L — ABNORMAL LOW (ref 1.15–1.40)
Calcium, Ion: 1.24 mmol/L (ref 1.15–1.40)
Calcium, Ion: 1.31 mmol/L (ref 1.15–1.40)
Calcium, Ion: 1.26 mmol/L (ref 1.15–1.40)
Calcium, Ion: 1.28 mmol/L (ref 1.15–1.40)
HCT: 27 % — ABNORMAL LOW (ref 39.0–52.0)
HCT: 20 % — ABNORMAL LOW (ref 39.0–52.0)
HCT: 20 % — ABNORMAL LOW (ref 39.0–52.0)
HCT: 21 % — ABNORMAL LOW (ref 39.0–52.0)
HCT: 22 % — ABNORMAL LOW (ref 39.0–52.0)
HCT: 23 % — ABNORMAL LOW (ref 39.0–52.0)
Hemoglobin: 9.2 g/dL — ABNORMAL LOW (ref 13.0–17.0)
Hemoglobin: 6.8 g/dL — CL (ref 13.0–17.0)
Hemoglobin: 6.8 g/dL — CL (ref 13.0–17.0)
Hemoglobin: 7.1 g/dL — ABNORMAL LOW (ref 13.0–17.0)
Hemoglobin: 7.5 g/dL — ABNORMAL LOW (ref 13.0–17.0)
Hemoglobin: 7.8 g/dL — ABNORMAL LOW (ref 13.0–17.0)
O2 Saturation: 100 %
O2 Saturation: 100 %
O2 Saturation: 100 %
O2 Saturation: 100 %
O2 Saturation: 99 %
O2 Saturation: 99 %
Patient temperature: 36
Patient temperature: 36.7
Potassium: 4.5 mmol/L (ref 3.5–5.1)
Potassium: 4.6 mmol/L (ref 3.5–5.1)
Potassium: 5.6 mmol/L — ABNORMAL HIGH (ref 3.5–5.1)
Potassium: 6.8 mmol/L (ref 3.5–5.1)
Potassium: 4.9 mmol/L (ref 3.5–5.1)
Potassium: 5 mmol/L (ref 3.5–5.1)
Sodium: 135 mmol/L (ref 135–145)
Sodium: 135 mmol/L (ref 135–145)
Sodium: 133 mmol/L — ABNORMAL LOW (ref 135–145)
Sodium: 135 mmol/L (ref 135–145)
Sodium: 136 mmol/L (ref 135–145)
Sodium: 137 mmol/L (ref 135–145)
TCO2: 34 mmol/L — ABNORMAL HIGH (ref 22–32)
TCO2: 30 mmol/L (ref 22–32)
TCO2: 31 mmol/L (ref 22–32)
TCO2: 32 mmol/L (ref 22–32)
TCO2: 26 mmol/L (ref 22–32)
TCO2: 27 mmol/L (ref 22–32)
pCO2 arterial: 54.8 mmHg — ABNORMAL HIGH (ref 32–48)
pCO2 arterial: 34.8 mmHg (ref 32–48)
pCO2 arterial: 41.5 mmHg (ref 32–48)
pCO2 arterial: 46 mmHg (ref 32–48)
pCO2 arterial: 36.4 mmHg (ref 32–48)
pCO2 arterial: 44.2 mmHg (ref 32–48)
pH, Arterial: 7.377 (ref 7.35–7.45)
pH, Arterial: 7.521 — ABNORMAL HIGH (ref 7.35–7.45)
pH, Arterial: 7.42 (ref 7.35–7.45)
pH, Arterial: 7.472 — ABNORMAL HIGH (ref 7.35–7.45)
pH, Arterial: 7.435 (ref 7.35–7.45)
pH, Arterial: 7.374 (ref 7.35–7.45)
pO2, Arterial: 560 mmHg — ABNORMAL HIGH (ref 83–108)
pO2, Arterial: 308 mmHg — ABNORMAL HIGH (ref 83–108)
pO2, Arterial: 384 mmHg — ABNORMAL HIGH (ref 83–108)
pO2, Arterial: 411 mmHg — ABNORMAL HIGH (ref 83–108)
pO2, Arterial: 155 mmHg — ABNORMAL HIGH (ref 83–108)
pO2, Arterial: 132 mmHg — ABNORMAL HIGH (ref 83–108)

## 2022-07-04 LAB — PREPARE RBC (CROSSMATCH)

## 2022-07-04 LAB — ECHO INTRAOPERATIVE TEE
Height: 72 in
Weight: 2497.37 oz

## 2022-07-04 LAB — POCT I-STAT EG7
Acid-Base Excess: 6 mmol/L — ABNORMAL HIGH (ref 0.0–2.0)
Bicarbonate: 30.5 mmol/L — ABNORMAL HIGH (ref 20.0–28.0)
Calcium, Ion: 1.15 mmol/L (ref 1.15–1.40)
HCT: 21 % — ABNORMAL LOW (ref 39.0–52.0)
Hemoglobin: 7.1 g/dL — ABNORMAL LOW (ref 13.0–17.0)
O2 Saturation: 74 %
Potassium: 4.3 mmol/L (ref 3.5–5.1)
Sodium: 136 mmol/L (ref 135–145)
TCO2: 32 mmol/L (ref 22–32)
pCO2, Ven: 41.7 mmHg — ABNORMAL LOW (ref 44–60)
pH, Ven: 7.473 — ABNORMAL HIGH (ref 7.25–7.43)
pO2, Ven: 37 mmHg (ref 32–45)

## 2022-07-04 LAB — COOXEMETRY PANEL
Carboxyhemoglobin: 2.5 % — ABNORMAL HIGH (ref 0.5–1.5)
Carboxyhemoglobin: 1.5 % (ref 0.5–1.5)
Methemoglobin: 1.5 % (ref 0.0–1.5)
Methemoglobin: <0.7 % (ref 0.0–1.5)
O2 Saturation: 69.1 %
O2 Saturation: 50.7 %
Total hemoglobin: 9 g/dL — ABNORMAL LOW (ref 12.0–16.0)
Total hemoglobin: 8.4 g/dL — ABNORMAL LOW (ref 12.0–16.0)

## 2022-07-04 LAB — GLUCOSE, CAPILLARY
Glucose-Capillary: 179 mg/dL — ABNORMAL HIGH (ref 70–99)
Glucose-Capillary: 100 mg/dL — ABNORMAL HIGH (ref 70–99)
Glucose-Capillary: 95 mg/dL (ref 70–99)
Glucose-Capillary: 110 mg/dL — ABNORMAL HIGH (ref 70–99)
Glucose-Capillary: 125 mg/dL — ABNORMAL HIGH (ref 70–99)
Glucose-Capillary: 104 mg/dL — ABNORMAL HIGH (ref 70–99)
Glucose-Capillary: 93 mg/dL (ref 70–99)
Glucose-Capillary: 94 mg/dL (ref 70–99)

## 2022-07-04 LAB — PLATELET COUNT: Platelets: 399 10*3/uL (ref 150–400)

## 2022-07-04 LAB — MAGNESIUM: Magnesium: 2.1 mg/dL (ref 1.7–2.4)

## 2022-07-04 LAB — PROTIME-INR
INR: 1.4 — ABNORMAL HIGH (ref 0.8–1.2)
Prothrombin Time: 17 seconds — ABNORMAL HIGH (ref 11.4–15.2)

## 2022-07-04 LAB — HEMOGLOBIN AND HEMATOCRIT, BLOOD
HCT: 17.5 % — ABNORMAL LOW (ref 39.0–52.0)
Hemoglobin: 5.4 g/dL — CL (ref 13.0–17.0)

## 2022-07-04 LAB — APTT: aPTT: 34 seconds (ref 24–36)

## 2022-07-04 SURGERY — CORONARY ARTERY BYPASS GRAFTING (CABG)
Anesthesia: General | Site: Chest

## 2022-07-04 MED ORDER — MORPHINE SULFATE (PF) 2 MG/ML IV SOLN
1.0000 mg | INTRAVENOUS | Status: DC | PRN
  Administered 2022-07-04: 2 mg via INTRAVENOUS
  Administered 2022-07-04 – 2022-07-05 (×3): 4 mg via INTRAVENOUS
  Filled 2022-07-04 (×2): qty 2
  Filled 2022-07-04: qty 1
  Filled 2022-07-04: qty 2

## 2022-07-04 MED ORDER — ASPIRIN 81 MG PO TBEC
81.0000 mg | DELAYED_RELEASE_TABLET | Freq: Every day | ORAL | Status: DC
  Administered 2022-07-05 – 2022-07-10 (×6): 81 mg via ORAL
  Filled 2022-07-04 (×6): qty 1

## 2022-07-04 MED ORDER — NITROGLYCERIN IN D5W 200-5 MCG/ML-% IV SOLN: 0.0000 ug/min | INTRAVENOUS | Status: DC

## 2022-07-04 MED ORDER — METOPROLOL TARTRATE 25 MG/10 ML ORAL SUSPENSION
12.5000 mg | Freq: Two times a day (BID) | ORAL | Status: DC
  Filled 2022-07-04: qty 10

## 2022-07-04 MED ORDER — 0.9 % SODIUM CHLORIDE (POUR BTL) OPTIME
TOPICAL | Status: DC | PRN
  Administered 2022-07-04: 3000 mL

## 2022-07-04 MED ORDER — PROPOFOL 10 MG/ML IV BOLUS
INTRAVENOUS | Status: DC | PRN
  Administered 2022-07-04: 100 mg via INTRAVENOUS

## 2022-07-04 MED ORDER — LACTATED RINGERS IV SOLN: INTRAVENOUS | Status: DC

## 2022-07-04 MED ORDER — POTASSIUM CHLORIDE 10 MEQ/50ML IV SOLN: 10.0000 meq | INTRAVENOUS | Status: AC

## 2022-07-04 MED ORDER — PROPOFOL 10 MG/ML IV BOLUS
INTRAVENOUS | Status: AC
  Filled 2022-07-04: qty 20

## 2022-07-04 MED ORDER — AMIODARONE HCL 200 MG PO TABS
400.0000 mg | ORAL_TABLET | Freq: Two times a day (BID) | ORAL | Status: DC
  Administered 2022-07-04: 400 mg via NASOGASTRIC
  Filled 2022-07-04 (×3): qty 2

## 2022-07-04 MED ORDER — MILRINONE LACTATE IN DEXTROSE 20-5 MG/100ML-% IV SOLN
0.1250 ug/kg/min | INTRAVENOUS | Status: DC
  Administered 2022-07-05 – 2022-07-06 (×2): 0.125 ug/kg/min via INTRAVENOUS
  Filled 2022-07-04 (×2): qty 100

## 2022-07-04 MED ORDER — MIDAZOLAM HCL (PF) 5 MG/ML IJ SOLN
INTRAMUSCULAR | Status: DC | PRN
  Administered 2022-07-04 (×2): 2 mg via INTRAVENOUS
  Administered 2022-07-04: 5 mg via INTRAVENOUS
  Administered 2022-07-04: 1 mg via INTRAVENOUS

## 2022-07-04 MED ORDER — VANCOMYCIN HCL 1000 MG IV SOLR
INTRAVENOUS | Status: DC
  Filled 2022-07-04 (×2): qty 20

## 2022-07-04 MED ORDER — ACETAMINOPHEN 500 MG PO TABS
1000.0000 mg | ORAL_TABLET | Freq: Four times a day (QID) | ORAL | Status: AC
  Administered 2022-07-05 – 2022-07-09 (×18): 1000 mg via ORAL
  Filled 2022-07-04 (×18): qty 2

## 2022-07-04 MED ORDER — ONDANSETRON HCL 4 MG/2ML IJ SOLN
4.0000 mg | Freq: Four times a day (QID) | INTRAMUSCULAR | Status: DC | PRN
  Administered 2022-07-04: 4 mg via INTRAVENOUS
  Filled 2022-07-04: qty 2

## 2022-07-04 MED ORDER — ACETAMINOPHEN 650 MG RE SUPP: 650.0000 mg | Freq: Once | RECTAL | Status: AC

## 2022-07-04 MED ORDER — PANTOPRAZOLE SODIUM 40 MG PO TBEC
40.0000 mg | DELAYED_RELEASE_TABLET | Freq: Every day | ORAL | Status: DC
  Administered 2022-07-06 – 2022-07-10 (×5): 40 mg via ORAL
  Filled 2022-07-04 (×5): qty 1

## 2022-07-04 MED ORDER — PHENYLEPHRINE 80 MCG/ML (10ML) SYRINGE FOR IV PUSH (FOR BLOOD PRESSURE SUPPORT)
PREFILLED_SYRINGE | INTRAVENOUS | Status: DC | PRN
  Administered 2022-07-04 (×4): 80 ug via INTRAVENOUS
  Administered 2022-07-04: 160 ug via INTRAVENOUS
  Administered 2022-07-04 (×2): 80 ug via INTRAVENOUS
  Administered 2022-07-04: 160 ug via INTRAVENOUS
  Administered 2022-07-04 (×4): 80 ug via INTRAVENOUS

## 2022-07-04 MED ORDER — MIDAZOLAM HCL (PF) 10 MG/2ML IJ SOLN
INTRAMUSCULAR | Status: AC
  Filled 2022-07-04: qty 2

## 2022-07-04 MED ORDER — FENTANYL CITRATE (PF) 250 MCG/5ML IJ SOLN
INTRAMUSCULAR | Status: AC
  Filled 2022-07-04: qty 5

## 2022-07-04 MED ORDER — MIDAZOLAM HCL 2 MG/2ML IJ SOLN
2.0000 mg | INTRAMUSCULAR | Status: DC | PRN
  Filled 2022-07-04: qty 2

## 2022-07-04 MED ORDER — ASPIRIN 325 MG PO TBEC: 325.0000 mg | DELAYED_RELEASE_TABLET | Freq: Every day | ORAL | Status: AC

## 2022-07-04 MED ORDER — METOPROLOL TARTRATE 12.5 MG HALF TABLET
12.5000 mg | ORAL_TABLET | Freq: Two times a day (BID) | ORAL | Status: DC
  Administered 2022-07-05: 12.5 mg via ORAL
  Filled 2022-07-04: qty 1

## 2022-07-04 MED ORDER — BISACODYL 10 MG RE SUPP
10.0000 mg | Freq: Every day | RECTAL | Status: DC
  Filled 2022-07-04: qty 1

## 2022-07-04 MED ORDER — METOPROLOL TARTRATE 5 MG/5ML IV SOLN: 2.5000 mg | INTRAVENOUS | Status: DC | PRN

## 2022-07-04 MED ORDER — SODIUM CHLORIDE 0.9% FLUSH: 3.0000 mL | INTRAVENOUS | Status: DC | PRN

## 2022-07-04 MED ORDER — HEPARIN SODIUM (PORCINE) 1000 UNIT/ML IJ SOLN
INTRAMUSCULAR | Status: AC
  Filled 2022-07-04: qty 10

## 2022-07-04 MED ORDER — PHENYLEPHRINE 80 MCG/ML (10ML) SYRINGE FOR IV PUSH (FOR BLOOD PRESSURE SUPPORT)
PREFILLED_SYRINGE | INTRAVENOUS | Status: AC
  Filled 2022-07-04: qty 10

## 2022-07-04 MED ORDER — VANCOMYCIN HCL IN DEXTROSE 1-5 GM/200ML-% IV SOLN
1000.0000 mg | Freq: Once | INTRAVENOUS | Status: AC
  Administered 2022-07-04: 1000 mg via INTRAVENOUS
  Filled 2022-07-04: qty 200

## 2022-07-04 MED ORDER — ASPIRIN 81 MG PO CHEW
324.0000 mg | CHEWABLE_TABLET | Freq: Every day | ORAL | Status: AC
  Administered 2022-07-04: 324 mg
  Filled 2022-07-04: qty 4

## 2022-07-04 MED ORDER — VANCOMYCIN HCL 1000 MG IV SOLR: INTRAVENOUS | Status: DC | PRN

## 2022-07-04 MED ORDER — ARTIFICIAL TEARS OPHTHALMIC OINT
TOPICAL_OINTMENT | OPHTHALMIC | Status: AC
  Filled 2022-07-04: qty 3.5

## 2022-07-04 MED ORDER — ALBUMIN HUMAN 5 % IV SOLN
250.0000 mL | INTRAVENOUS | Status: AC | PRN
  Administered 2022-07-04 (×4): 12.5 g via INTRAVENOUS
  Filled 2022-07-04 (×2): qty 250

## 2022-07-04 MED ORDER — INSULIN ASPART 100 UNIT/ML IJ SOLN
0.0000 [IU] | INTRAMUSCULAR | Status: DC
  Administered 2022-07-05 (×4): 2 [IU] via SUBCUTANEOUS
  Administered 2022-07-05: 4 [IU] via SUBCUTANEOUS
  Administered 2022-07-05 – 2022-07-06 (×2): 2 [IU] via SUBCUTANEOUS
  Administered 2022-07-06 (×2): 4 [IU] via SUBCUTANEOUS
  Administered 2022-07-06 – 2022-07-07 (×4): 2 [IU] via SUBCUTANEOUS

## 2022-07-04 MED ORDER — CHLORHEXIDINE GLUCONATE CLOTH 2 % EX PADS
6.0000 | MEDICATED_PAD | Freq: Every day | CUTANEOUS | Status: DC
  Administered 2022-07-04 – 2022-07-09 (×7): 6 via TOPICAL

## 2022-07-04 MED ORDER — PROTAMINE SULFATE 10 MG/ML IV SOLN
INTRAVENOUS | Status: AC
  Filled 2022-07-04: qty 5

## 2022-07-04 MED ORDER — LACTATED RINGERS IV SOLN: 500.0000 mL | Freq: Once | INTRAVENOUS | Status: DC | PRN

## 2022-07-04 MED ORDER — LACTATED RINGERS IV SOLN: INTRAVENOUS | Status: DC | PRN

## 2022-07-04 MED ORDER — TRAMADOL HCL 50 MG PO TABS
50.0000 mg | ORAL_TABLET | ORAL | Status: DC | PRN
  Administered 2022-07-04 – 2022-07-05 (×3): 100 mg via ORAL
  Filled 2022-07-04 (×3): qty 2

## 2022-07-04 MED ORDER — ALBUMIN HUMAN 5 % IV SOLN
INTRAVENOUS | Status: DC | PRN
  Administered 2022-07-04: 12.5 g via INTRAVENOUS

## 2022-07-04 MED ORDER — DEXTROSE 50 % IV SOLN: 0.0000 mL | INTRAVENOUS | Status: DC | PRN

## 2022-07-04 MED ORDER — ANTITHROMBIN III (HUMAN) 500 UNITS IV SOLR
INTRAVENOUS | Status: DC | PRN
  Administered 2022-07-04: 500 [IU] via INTRAVENOUS

## 2022-07-04 MED ORDER — ROCURONIUM BROMIDE 10 MG/ML (PF) SYRINGE
PREFILLED_SYRINGE | INTRAVENOUS | Status: DC | PRN
  Administered 2022-07-04: 100 mg via INTRAVENOUS
  Administered 2022-07-04 (×3): 50 mg via INTRAVENOUS

## 2022-07-04 MED ORDER — HEPARIN SODIUM (PORCINE) 1000 UNIT/ML IJ SOLN
INTRAMUSCULAR | Status: DC | PRN
  Administered 2022-07-04: 10000 [IU] via INTRAVENOUS
  Administered 2022-07-04: 25000 [IU] via INTRAVENOUS

## 2022-07-04 MED ORDER — ROCURONIUM BROMIDE 10 MG/ML (PF) SYRINGE
PREFILLED_SYRINGE | INTRAVENOUS | Status: AC
  Filled 2022-07-04: qty 10

## 2022-07-04 MED ORDER — PLASMA-LYTE A IV SOLN: INTRAVENOUS | Status: DC | PRN

## 2022-07-04 MED ORDER — SODIUM CHLORIDE 0.9 % IV SOLN: INTRAVENOUS | Status: DC | PRN

## 2022-07-04 MED ORDER — SODIUM CHLORIDE 0.45 % IV SOLN: INTRAVENOUS | Status: DC | PRN

## 2022-07-04 MED ORDER — BISACODYL 5 MG PO TBEC
10.0000 mg | DELAYED_RELEASE_TABLET | Freq: Every day | ORAL | Status: DC
  Administered 2022-07-05 – 2022-07-10 (×3): 10 mg via ORAL
  Filled 2022-07-04 (×4): qty 2

## 2022-07-04 MED ORDER — OXYCODONE HCL 5 MG PO TABS
5.0000 mg | ORAL_TABLET | ORAL | Status: DC | PRN
  Administered 2022-07-04 – 2022-07-06 (×6): 10 mg via ORAL
  Filled 2022-07-04 (×6): qty 2

## 2022-07-04 MED ORDER — SODIUM CHLORIDE 0.9% IV SOLUTION: Freq: Once | INTRAVENOUS | Status: AC

## 2022-07-04 MED ORDER — FAMOTIDINE IN NACL 20-0.9 MG/50ML-% IV SOLN
20.0000 mg | Freq: Two times a day (BID) | INTRAVENOUS | Status: AC
  Administered 2022-07-04 (×2): 20 mg via INTRAVENOUS
  Filled 2022-07-04 (×2): qty 50

## 2022-07-04 MED ORDER — HEPARIN SODIUM (PORCINE) 1000 UNIT/ML IJ SOLN
INTRAMUSCULAR | Status: AC
  Filled 2022-07-04: qty 1

## 2022-07-04 MED ORDER — ACETAMINOPHEN 160 MG/5ML PO SOLN: 1000.0000 mg | Freq: Four times a day (QID) | ORAL | Status: AC

## 2022-07-04 MED ORDER — PAPAVERINE HCL 30 MG/ML IJ SOLN
INTRAMUSCULAR | Status: DC | PRN
  Administered 2022-07-04: 60 mg via INTRAVENOUS

## 2022-07-04 MED ORDER — PROTAMINE SULFATE 10 MG/ML IV SOLN
INTRAVENOUS | Status: AC
  Filled 2022-07-04: qty 25

## 2022-07-04 MED ORDER — ALBUMIN HUMAN 5 % IV SOLN
12.5000 g | Freq: Once | INTRAVENOUS | Status: DC | PRN
  Filled 2022-07-04: qty 250

## 2022-07-04 MED ORDER — DOCUSATE SODIUM 100 MG PO CAPS
200.0000 mg | ORAL_CAPSULE | Freq: Every day | ORAL | Status: DC
  Administered 2022-07-05 – 2022-07-10 (×3): 200 mg via ORAL
  Filled 2022-07-04 (×4): qty 2

## 2022-07-04 MED ORDER — CHLORHEXIDINE GLUCONATE 0.12 % MT SOLN
15.0000 mL | OROMUCOSAL | Status: AC
  Administered 2022-07-04: 15 mL via OROMUCOSAL
  Filled 2022-07-04: qty 15

## 2022-07-04 MED ORDER — ACETAMINOPHEN 160 MG/5ML PO SOLN
650.0000 mg | Freq: Once | ORAL | Status: AC
  Administered 2022-07-04: 650 mg
  Filled 2022-07-04: qty 20.3

## 2022-07-04 MED ORDER — DEXMEDETOMIDINE HCL IN NACL 400 MCG/100ML IV SOLN: 0.0000 ug/kg/h | INTRAVENOUS | Status: DC

## 2022-07-04 MED ORDER — AMIODARONE HCL 200 MG PO TABS
400.0000 mg | ORAL_TABLET | Freq: Two times a day (BID) | ORAL | Status: DC
  Administered 2022-07-04: 400 mg via ORAL

## 2022-07-04 MED ORDER — CLOPIDOGREL BISULFATE 75 MG PO TABS
75.0000 mg | ORAL_TABLET | Freq: Every day | ORAL | Status: DC
  Administered 2022-07-05 – 2022-07-10 (×6): 75 mg via ORAL
  Filled 2022-07-04 (×6): qty 1

## 2022-07-04 MED ORDER — SODIUM CHLORIDE 0.9 % IV SOLN: 10.0000 mL/h | Freq: Once | INTRAVENOUS | Status: DC

## 2022-07-04 MED ORDER — MAGNESIUM SULFATE 4 GM/100ML IV SOLN: 4.0000 g | Freq: Once | INTRAVENOUS | Status: DC

## 2022-07-04 MED ORDER — SODIUM CHLORIDE 0.9% FLUSH
3.0000 mL | Freq: Two times a day (BID) | INTRAVENOUS | Status: DC
  Administered 2022-07-05 – 2022-07-10 (×11): 3 mL via INTRAVENOUS

## 2022-07-04 MED ORDER — PHENYLEPHRINE HCL-NACL 20-0.9 MG/250ML-% IV SOLN
0.0000 ug/min | INTRAVENOUS | Status: DC
  Administered 2022-07-04: 30 ug/min via INTRAVENOUS
  Administered 2022-07-05: 60 ug/min via INTRAVENOUS
  Administered 2022-07-05: 40 ug/min via INTRAVENOUS
  Administered 2022-07-05: 50 ug/min via INTRAVENOUS
  Filled 2022-07-04 (×4): qty 250

## 2022-07-04 MED ORDER — ARTIFICIAL TEARS OPHTHALMIC OINT
TOPICAL_OINTMENT | OPHTHALMIC | Status: DC | PRN
  Administered 2022-07-04: 1 via OPHTHALMIC

## 2022-07-04 MED ORDER — INSULIN REGULAR(HUMAN) IN NACL 100-0.9 UT/100ML-% IV SOLN: INTRAVENOUS | Status: DC

## 2022-07-04 MED ORDER — SODIUM CHLORIDE (PF) 0.9 % IJ SOLN: OROMUCOSAL | Status: DC | PRN

## 2022-07-04 MED ORDER — CEFAZOLIN SODIUM-DEXTROSE 2-4 GM/100ML-% IV SOLN
2.0000 g | Freq: Three times a day (TID) | INTRAVENOUS | Status: AC
  Administered 2022-07-04 – 2022-07-06 (×6): 2 g via INTRAVENOUS
  Filled 2022-07-04 (×6): qty 100

## 2022-07-04 MED ORDER — SODIUM CHLORIDE 0.9 % IV SOLN: INTRAVENOUS | Status: DC

## 2022-07-04 MED ORDER — SODIUM CHLORIDE 0.9% IV SOLUTION: Freq: Once | INTRAVENOUS | Status: DC

## 2022-07-04 MED ORDER — SODIUM CHLORIDE 0.9 % IV SOLN: 250.0000 mL | INTRAVENOUS | Status: DC

## 2022-07-04 MED ORDER — ROCURONIUM BROMIDE 10 MG/ML (PF) SYRINGE
PREFILLED_SYRINGE | INTRAVENOUS | Status: AC
  Filled 2022-07-04: qty 20

## 2022-07-04 MED ORDER — PROTAMINE SULFATE 10 MG/ML IV SOLN
INTRAVENOUS | Status: DC | PRN
  Administered 2022-07-04: 300 mg via INTRAVENOUS

## 2022-07-04 MED ORDER — LIDOCAINE 2% (20 MG/ML) 5 ML SYRINGE
INTRAMUSCULAR | Status: AC
  Filled 2022-07-04: qty 5

## 2022-07-04 MED ORDER — FENTANYL CITRATE (PF) 250 MCG/5ML IJ SOLN
INTRAMUSCULAR | Status: DC | PRN
  Administered 2022-07-04: 150 ug via INTRAVENOUS
  Administered 2022-07-04: 50 ug via INTRAVENOUS
  Administered 2022-07-04 (×2): 100 ug via INTRAVENOUS
  Administered 2022-07-04: 200 ug via INTRAVENOUS
  Administered 2022-07-04: 50 ug via INTRAVENOUS
  Administered 2022-07-04: 100 ug via INTRAVENOUS
  Administered 2022-07-04: 50 ug via INTRAVENOUS
  Administered 2022-07-04: 200 ug via INTRAVENOUS

## 2022-07-04 SURGICAL SUPPLY — 88 items
ADAPTER CARDIO PERF ANTE/RETRO (ADAPTER) ×2 IMPLANT
BAG DECANTER FOR FLEXI CONT (MISCELLANEOUS) ×2 IMPLANT
BLADE CLIPPER SURG (BLADE) ×2 IMPLANT
BLADE STERNUM SYSTEM 6 (BLADE) ×2 IMPLANT
BNDG ELASTIC 4X5.8 VLCR STR LF (GAUZE/BANDAGES/DRESSINGS) ×2 IMPLANT
BNDG ELASTIC 6X5.8 VLCR STR LF (GAUZE/BANDAGES/DRESSINGS) ×2 IMPLANT
BNDG GAUZE DERMACEA FLUFF 4 (GAUZE/BANDAGES/DRESSINGS) ×2 IMPLANT
BNDG GAUZE ELAST 4 BULKY (GAUZE/BANDAGES/DRESSINGS) IMPLANT
BOOT SUTURE AID YELLOW STND (SUTURE) ×2 IMPLANT
CANISTER SUCT 3000ML PPV (MISCELLANEOUS) ×2 IMPLANT
CANNULA NON VENT 20FR 12 (CANNULA) IMPLANT
CANNULA NON VENT 22FR 12 (CANNULA) ×2 IMPLANT
CANNULA SUMP PERICARDIAL (CANNULA) ×2 IMPLANT
CATH CPB KIT GERHARDT (MISCELLANEOUS) ×2 IMPLANT
CATH RETROPLEGIA CORONARY 14FR (CATHETERS) ×2 IMPLANT
CATH ROBINSON RED A/P 18FR (CATHETERS) ×6 IMPLANT
CATH THOR STR 32F SOFT 20 RADI (CATHETERS) ×4 IMPLANT
CATH THORACIC 28FR RT ANG (CATHETERS) ×2 IMPLANT
CLIP TI LARGE 6 (CLIP) ×2 IMPLANT
CLIP TI MEDIUM 24 (CLIP) IMPLANT
CLIP VESOCCLUDE MED 24/CT (CLIP) IMPLANT
CLIP VESOCCLUDE SM WIDE 24/CT (CLIP) IMPLANT
CONTAINER PROTECT SURGISLUSH (MISCELLANEOUS) ×4 IMPLANT
DERMABOND ADVANCED .7 DNX12 (GAUZE/BANDAGES/DRESSINGS) IMPLANT
DRAPE CARDIOVASCULAR INCISE (DRAPES) ×2
DRAPE INCISE IOBAN 66X45 STRL (DRAPES) ×2 IMPLANT
DRAPE SRG 135X102X78XABS (DRAPES) ×2 IMPLANT
DRAPE WARM FLUID 44X44 (DRAPES) ×2 IMPLANT
DRESSING AQUACEL AG ADV 3.5X12 (MISCELLANEOUS) ×2 IMPLANT
DRSG AQUACEL AG ADV 3.5X10 (GAUZE/BANDAGES/DRESSINGS) IMPLANT
DRSG AQUACEL AG ADV 3.5X12 (MISCELLANEOUS) ×2
DRSG AQUACEL AG ADV 3.5X14 (GAUZE/BANDAGES/DRESSINGS) ×2 IMPLANT
ELECT BLADE 4.0 EZ CLEAN MEGAD (MISCELLANEOUS) ×2
ELECT CAUTERY BLADE 6.4 (BLADE) ×2 IMPLANT
ELECT REM PT RETURN 9FT ADLT (ELECTROSURGICAL) ×4
ELECTRODE BLDE 4.0 EZ CLN MEGD (MISCELLANEOUS) ×2 IMPLANT
ELECTRODE REM PT RTRN 9FT ADLT (ELECTROSURGICAL) ×4 IMPLANT
FELT TEFLON 1X6 (MISCELLANEOUS) ×4 IMPLANT
GAUZE SPONGE 4X4 12PLY STRL (GAUZE/BANDAGES/DRESSINGS) ×4 IMPLANT
GOWN STRL REUS W/ TWL LRG LVL3 (GOWN DISPOSABLE) ×12 IMPLANT
GOWN STRL REUS W/TWL LRG LVL3 (GOWN DISPOSABLE) ×12
HEMOSTAT POWDER SURGIFOAM 1G (HEMOSTASIS) ×6 IMPLANT
HEMOSTAT SURGICEL 2X14 (HEMOSTASIS) ×2 IMPLANT
INSERT FOGARTY 61MM (MISCELLANEOUS) IMPLANT
KIT BASIN OR (CUSTOM PROCEDURE TRAY) ×2 IMPLANT
KIT CATH CPB BARTLE (MISCELLANEOUS) ×2 IMPLANT
KIT SUCTION CATH 14FR (SUCTIONS) ×2 IMPLANT
KIT TURNOVER KIT B (KITS) ×2 IMPLANT
KIT VASOVIEW HEMOPRO 2 VH 4000 (KITS) ×2 IMPLANT
LINE VENT (MISCELLANEOUS) IMPLANT
NS IRRIG 1000ML POUR BTL (IV SOLUTION) ×10 IMPLANT
PACK E OPEN HEART (SUTURE) ×2 IMPLANT
PACK OPEN HEART (CUSTOM PROCEDURE TRAY) ×2 IMPLANT
PAD ARMBOARD 7.5X6 YLW CONV (MISCELLANEOUS) ×4 IMPLANT
PAD ELECT DEFIB RADIOL ZOLL (MISCELLANEOUS) ×2 IMPLANT
PENCIL BUTTON HOLSTER BLD 10FT (ELECTRODE) ×2 IMPLANT
POSITIONER HEAD DONUT 9IN (MISCELLANEOUS) ×2 IMPLANT
PUNCH AORTIC ROTATE 4.0MM (MISCELLANEOUS) ×2 IMPLANT
PUNCH AORTIC ROTATE 4.5MM 8IN (MISCELLANEOUS) ×2 IMPLANT
SET MPS 3-ND DEL (MISCELLANEOUS) IMPLANT
SUPPORT HEART JANKE-BARRON (MISCELLANEOUS) ×2 IMPLANT
SUT BONE WAX W31G (SUTURE) ×2 IMPLANT
SUT MNCRL AB 4-0 PS2 18 (SUTURE) ×4 IMPLANT
SUT PROLENE 4 0 RB 1 (SUTURE) ×4
SUT PROLENE 4 0 SH DA (SUTURE) ×2 IMPLANT
SUT PROLENE 4-0 RB1 .5 CRCL 36 (SUTURE) IMPLANT
SUT PROLENE 6 0 C 1 30 (SUTURE) IMPLANT
SUT PROLENE 7 0 BV 1 (SUTURE) IMPLANT
SUT PROLENE 7 0 BV1 MDA (SUTURE) ×2 IMPLANT
SUT PROLENE 8 0 BV175 6 (SUTURE) IMPLANT
SUT SILK  1 MH (SUTURE) ×6
SUT SILK 1 MH (SUTURE) IMPLANT
SUT SILK 2 0 SH (SUTURE) IMPLANT
SUT STEEL SZ 6 DBL 3X14 BALL (SUTURE) ×6 IMPLANT
SUT VIC AB 0 CTX 36 (SUTURE) ×4
SUT VIC AB 0 CTX36XBRD ANTBCTR (SUTURE) ×4 IMPLANT
SUT VIC AB 2-0 CT1 27 (SUTURE) ×6
SUT VIC AB 2-0 CT1 TAPERPNT 27 (SUTURE) ×4 IMPLANT
SYSTEM SAHARA CHEST DRAIN ATS (WOUND CARE) ×2 IMPLANT
TAPE CLOTH SURG 4X10 WHT LF (GAUZE/BANDAGES/DRESSINGS) IMPLANT
TAPE PAPER 3X10 WHT MICROPORE (GAUZE/BANDAGES/DRESSINGS) IMPLANT
TOWEL GREEN STERILE (TOWEL DISPOSABLE) ×2 IMPLANT
TOWEL GREEN STERILE FF (TOWEL DISPOSABLE) ×2 IMPLANT
TRAY FOLEY SLVR 16FR TEMP STAT (SET/KITS/TRAYS/PACK) ×2 IMPLANT
TUBE CONNECTING 20X1/4 (TUBING) IMPLANT
TUBING LAP HI FLOW INSUFFLATIO (TUBING) ×2 IMPLANT
UNDERPAD 30X36 HEAVY ABSORB (UNDERPADS AND DIAPERS) ×2 IMPLANT
WATER STERILE IRR 1000ML POUR (IV SOLUTION) ×4 IMPLANT

## 2022-07-04 NOTE — Progress Notes (Addendum)
PROGRESS NOTE    [name]  ZOX:[id number] DOB: Jul 02, 1966 DOA: 06/24/2022 PCP: Charolette Forward, MD  56 yo male with the past medical history of coronary artery disease, hypertension, and dyslipidemia who presented with chest pain.BNP 639, Trop 139, CXR with pulmonary vascular congestion, bilateral interstitial infiltrates, small pleural effusions. -2D echo noted EF of 40-45% with wall motion abnormality, severe MR -TEE moderate to severe MR -Left heart cath 9/6 with advanced multivessel CAD -9/6 started milrinone [facility] course complicated by worsening anemia, transfused PRBC 9/11 -9/12, EGD/colon Skippy with nonobstructing Schatzki ring, hiatal hernia, normal stomach, colonoscopy 2 polyps resected, diverticulosis, capsule study 9/13 negative for bleeding source -Cardiac MRI with EF of 29%, study consistent with multivessel disease, most of myocardium is viable -for CABG today    Subjective: -pt seen being wheeled out for CABG, no complaints  Assessment and Plan:  Acute hypoxic respiratory failure Acute systolic heart failure ([place]) Severe MR -Echo noted EF 40-45%, with wall motion abnormality, RV function is preserved, severe MR  TEE with moderate to severe mitral regurgitation  -09/06 cardiac catheterization with advance multivessel coronary artery disease.  -Advanced heart failure team following, diuresed with IV Lasix, off diuretics now, remains on low dose milrinone -Remains on Entresto digoxin and Aldactone -Neck MRI completed, study consistent with multivessel CAD, most myocardium appears viable, for CABG/MVR today   Multivessel CAD, severe MR NSTEMI  -Troponin peaked at 1477 -LHC with severe multivessel CAD and high-grade left main disease -T CTS consulted -Remains on aspirin statin and heparin GTT, beta-blocker on hold -for CABG/MVR today   Type 2 diabetes mellitus with hyperlipidemia (HCC) -A1c is 8.2 -Start SGLT2i as tolerated -Hold last nights Semglee  dose for surgery, continue meal coverage, anticipate endotool in surgery   Iron deficiency anemia -Hemoglobin trended down to 7.1, 2 units of PRBC transfused -Anemia panel with iron deficiency, IV iron on hold -GI consulted, EGD/colonoscopy/capsule endoscopy largely unremarkable for any active bleeding, hemoglobin is stable   EtOH abuse   Hypokalemia Hyponatremia. Hypomagnesemia   GERD (gastroesophageal reflux disease) Continue pantoprazole    Tobacco abuse Smoking cessation    ? Left upper lobe pneumonia 09/08 fever 100.4, -Questionable pneumonia on admission imaging -Clinically do not suspect pneumonia, suspect symptoms are primarily related to pulmonary edema, off Abx now      DVT prophylaxis: Heparin SQ Code Status: Full code Family Communication: Discussed patient detail, no family at bedside Disposition Plan: Home pending CABG   Consultants: Cards, TCTS     Procedures: EGD/colonoscopy   Antimicrobials:    Objective: Vitals:   07/04/22 0715 07/04/22 0716 07/04/22 0717 07/04/22 0718  BP: 121/78     Pulse: 78 75 82 82  Resp: (!) 0 (!) 0 15 20  Temp:      TempSrc:      SpO2: 100% 100% 100% 100%  Weight:      Height:        Intake/Output Summary (Last 24 hours) at 07/04/2022 1329 Last data filed at 07/04/2022 1253 Gross per 24 hour  Intake 2610 ml  Output 4160 ml  Net -1550 ml   Filed Weights   07/01/22 1248 07/02/22 0400 07/04/22 0458  Weight: 72.6 kg 71.4 kg 70.8 kg    Examination:  General exam: Gen: Awake, Alert, Oriented X 3,  HEENT: no JVD Lungs: Good air movement bilaterally, CTAB CVS: W1X9/JYN, systolic murmur Abd: soft, Non tender, non distended, BS present Extremities: trace edema Skin: no new rashes on exposed skin  Data Reviewed:   CBC: Recent Labs  Lab 06/30/22 1410 07/01/22 0425 07/02/22 0535 07/03/22 0550 07/04/22 0430 07/04/22 0820 07/04/22 1125 07/04/22 1130 07/04/22 1151 07/04/22 1155 07/04/22 1220  07/04/22 1223  WBC 8.5 9.2 8.6 10.0 9.0  --   --   --   --   --   --   --   HGB 8.7* 8.7* 9.3* 9.0* 8.5*   < > 5.4* 6.8* 6.8* 7.1* 7.1* 7.8*  HCT 28.4* 29.2* 30.2* 29.6* 27.9*   < > 17.5* 20.0* 20.0* 21.0* 21.0* 23.0*  MCV 79.1* 79.1* 78.4* 78.5* 79.0*  --   --   --   --   --   --   --   PLT 416* 472* 540* 572* 560*  --  399  --   --   --   --   --    < > = values in this interval not displayed.   Basic Metabolic Panel: Recent Labs  Lab 06/30/22 0528 07/01/22 0425 07/02/22 0535 07/03/22 0550 07/04/22 0430 07/04/22 0820 07/04/22 0951 07/04/22 1017 07/04/22 1037 07/04/22 1130 07/04/22 1151 07/04/22 1155 07/04/22 1220 07/04/22 1223  NA 133* 134* 134* 134* 134*   < > 133*   < > 132* 134* 133* 133* 135 135  K 3.7 4.2 4.1 4.7 4.7   < > 4.5   < > 6.3* 6.5* 6.8* 6.7* 5.6* 5.6*  CL 96* 101 98 100 97*   < > 96*  --  96* 98  --  99  --  100  CO2 '27 24 26 26 28  '$ --   --   --   --   --   --   --   --   --   GLUCOSE 217* 133* 153* 237* 189*   < > 131*  --  123* 109*  --  141*  --  140*  BUN '13 10 7 10 10   '$ < > 6  --  6 6  --  6  --  5*  CREATININE 0.87 0.74 0.67 0.80 0.76   < > 0.50*  --  0.40* 0.30*  --  0.40*  --  0.40*  CALCIUM 9.2 9.1 9.1 9.6 10.1  --   --   --   --   --   --   --   --   --   MG 1.7 2.1 1.8  --   --   --   --   --   --   --   --   --   --   --    < > = values in this interval not displayed.   GFR: Estimated Creatinine Clearance: 104.5 mL/min (A) (by C-G formula based on SCr of 0.4 mg/dL (L)). Liver Function Tests: Recent Labs  Lab 07/01/22 0425  AST 335*  ALT 231*  ALKPHOS 123  BILITOT 0.7  PROT 7.1  ALBUMIN 2.3*   No results for input(s): "LIPASE", "AMYLASE" in the last 168 hours. No results for input(s): "AMMONIA" in the last 168 hours. Coagulation Profile: Recent Labs  Lab 07/03/22 1935  INR 1.0   Cardiac Enzymes: No results for input(s): "CKTOTAL", "CKMB", "CKMBINDEX", "TROPONINI" in the last 168 hours. BNP (last 3 results) No results for  input(s): "PROBNP" in the last 8760 hours. HbA1C: Recent Labs    07/03/22 1935  HGBA1C 7.9*   CBG: Recent Labs  Lab 07/03/22 0627 07/03/22 1105 07/03/22 1619 07/03/22 2129 07/04/22 0430  GLUCAP 210* 154* 185* 187* 179*   Lipid Profile: No results for input(s): "CHOL", "HDL", "LDLCALC", "TRIG", "CHOLHDL", "LDLDIRECT" in the last 72 hours. Thyroid Function Tests: No results for input(s): "TSH", "T4TOTAL", "FREET4", "T3FREE", "THYROIDAB" in the last 72 hours. Anemia Panel: No results for input(s): "VITAMINB12", "FOLATE", "FERRITIN", "TIBC", "IRON", "RETICCTPCT" in the last 72 hours. Urine analysis:    Component Value Date/Time   COLORURINE YELLOW 07/03/2022 2156   APPEARANCEUR CLEAR 07/03/2022 2156   LABSPEC 1.014 07/03/2022 2156   PHURINE 6.0 07/03/2022 2156   GLUCOSEU 150 (A) 07/03/2022 2156   HGBUR NEGATIVE 07/03/2022 2156   BILIRUBINUR NEGATIVE 07/03/2022 2156   KETONESUR NEGATIVE 07/03/2022 2156   PROTEINUR NEGATIVE 07/03/2022 2156   UROBILINOGEN 1.0 01/30/2015 1029   NITRITE NEGATIVE 07/03/2022 2156   LEUKOCYTESUR NEGATIVE 07/03/2022 2156   Sepsis Labs: '@LABRCNTIP'$ (procalcitonin:4,lacticidven:4)  ) Recent Results (from the past 240 hour(s))  Urine Culture     Status: None   Collection Time: 06/26/22  9:16 PM   Specimen: Urine, Clean Catch  Result Value Ref Range Status   Specimen Description URINE, CLEAN CATCH  Final   Special Requests NONE  Final   Culture   Final    NO GROWTH Performed at [facility], [place] [address], [place], [place] [address]    Report Status 06/29/2022 FINAL  Final  Culture, blood (Routine X 2) w Reflex to ID Panel     Status: None   Collection Time: 06/26/22 10:24 PM   Specimen: BLOOD  Result Value Ref Range Status   Specimen Description BLOOD RIGHT ANTECUBITAL  Final   Special Requests   Final    BOTTLES DRAWN AEROBIC AND ANAEROBIC Blood Culture adequate volume   Culture   Final    NO GROWTH 5 DAYS Performed at [facility], [place] [address] [place], [place] [address]    Report Status 07/01/2022 FINAL  Final  Culture, blood (Routine X 2) w Reflex to ID Panel     Status: None   Collection Time: 06/26/22 10:41 PM   Specimen: BLOOD RIGHT HAND  Result Value Ref Range Status   Specimen Description BLOOD RIGHT HAND  Final   Special Requests   Final    BOTTLES DRAWN AEROBIC AND ANAEROBIC Blood Culture adequate volume   Culture   Final    NO GROWTH 5 DAYS Performed at [facility], [place] [address], [place],  [address]    Report Status 07/01/2022 FINAL  Final  Respiratory (~20 pathogens) panel by PCR     Status: None   Collection Time: 06/27/22  7:28 AM   Specimen: Nasopharyngeal Swab; Respiratory  Result Value Ref Range Status   Adenovirus NOT DETECTED NOT DETECTED Final   Coronavirus 229E NOT DETECTED NOT DETECTED Final    Comment: (NOTE) The Coronavirus on the Respiratory Panel, DOES NOT test for the novel  Coronavirus (2019 nCoV)    Coronavirus HKU1 NOT DETECTED NOT DETECTED Final   Coronavirus NL63 NOT DETECTED NOT DETECTED Final   Coronavirus OC43 NOT DETECTED NOT DETECTED Final   Metapneumovirus NOT DETECTED NOT DETECTED Final   Rhinovirus / Enterovirus NOT DETECTED NOT DETECTED Final   Influenza A NOT DETECTED NOT DETECTED Final   Influenza B NOT DETECTED NOT DETECTED Final   Parainfluenza Virus 1 NOT DETECTED NOT DETECTED Final   Parainfluenza Virus 2 NOT DETECTED NOT DETECTED Final   Parainfluenza Virus 3 NOT DETECTED NOT DETECTED Final   Parainfluenza Virus 4 NOT  DETECTED NOT DETECTED Final   Respiratory Syncytial Virus NOT DETECTED NOT DETECTED Final   Bordetella pertussis NOT DETECTED NOT DETECTED Final   Bordetella Parapertussis NOT DETECTED NOT DETECTED Final   Chlamydophila pneumoniae NOT DETECTED NOT DETECTED Final   Mycoplasma pneumoniae NOT DETECTED NOT DETECTED Final    Comment: Performed at [facility], [place] [address], [place], [place] [address]  SARS  Coronavirus 2 by RT PCR (hospital order, performed in [facility] hospital lab) *cepheid single result test* Anterior Nasal Swab     Status: None   Collection Time: 06/27/22  7:28 AM   Specimen: Anterior Nasal Swab  Result Value Ref Range Status   SARS Coronavirus 2 by RT PCR NEGATIVE NEGATIVE Final    Comment: (NOTE) SARS-CoV-2 target nucleic acids are NOT DETECTED.  The SARS-CoV-2 RNA is generally detectable in upper and lower respiratory specimens during the acute phase of infection. The lowest concentration of SARS-CoV-2 viral copies this assay can detect is 250 copies / mL. A negative result does not preclude SARS-CoV-2 infection and should not be used as the sole basis for treatment or other patient management decisions.  A negative result may occur with improper specimen collection / handling, submission of specimen other than nasopharyngeal swab, presence of viral mutation(s) within the areas targeted by this assay, and inadequate number of viral copies (<250 copies / mL). A negative result must be combined with clinical observations, patient history, and epidemiological information.  Fact Sheet for Patients:   https://www.patel.info/  Fact Sheet for Healthcare Providers: https://hall.com/  This test is not yet approved or  cleared by the [place] FDA and has been authorized for detection and/or diagnosis of SARS-CoV-2 by FDA under an Emergency Use Authorization (EUA).  This EUA will remain in effect (meaning this test can be used) for the duration of the COVID-19 declaration under Section 564(b)(1) of the Act, 21 U.S.C. section 360bbb-3(b)(1), unless the authorization is terminated or revoked sooner.  Performed at [facility], [place] [address], [place],  [address]   Surgical pcr screen     Status: None   Collection Time: 07/02/22  5:51 AM   Specimen: Nasal Mucosa; Nasal Swab  Result Value Ref Range Status    MRSA, PCR NEGATIVE NEGATIVE Final   Staphylococcus aureus NEGATIVE NEGATIVE Final    Comment: (NOTE) The Xpert SA Assay (FDA approved for NASAL specimens in patients 11 years of age and older), is one component of a comprehensive surveillance program. It is not intended to diagnose infection nor to guide or monitor treatment. Performed at [facility], [place] [address], [place],  [address]      Radiology Studies: ECHO INTRAOPERATIVE TEE  Result Date: 07/04/2022  *INTRAOPERATIVE TRANSESOPHAGEAL REPORT *  Patient Name:   EZRIEL BOFFA Date of Exam: 07/04/2022 Medical Rec #:  [id number]            Height:       72.0 in Accession #:    [id number]           Weight:       156.1 lb Date of Birth:  Jul 22, 1966             BSA:          1.92 m Patient Age:    48 years             BP:           121/78 mmHg Patient Gender: M  HR:           78 bpm. Exam Location:  Anesthesiology Transesophogeal exam was perform intraoperatively during surgical procedure. Patient was closely monitored under general anesthesia during the entirety of examination. Indications:     Mitral Regurgitation i34.0; CAD Native Vessel i25.10 Sonographer:     Raquel Sarna Senior RDCS Performing Phys: Renold Don MD Diagnosing Phys: Renold Don MD Complications: No known complications during this procedure. POST-OP IMPRESSIONS _ Left Ventricle: The wall motion is abnormal with regional variation. Slightly improved lateral wall motion. Inferior wall unchanged. _ Right Ventricle: The right ventricle appears unchanged from pre-bypass. _ Aorta: The aorta appears unchanged from pre-bypass. _ Left Atrium: The left atrium appears unchanged from pre-bypass. _ Left Atrial Appendage: The left atrial appendage appears unchanged from pre-bypass. _ Aortic Valve: The aortic valve appears unchanged from pre-bypass. _ Mitral Valve: The mitral valve appears unchanged from pre-bypass. _ Tricuspid Valve: The tricuspid valve appears  unchanged from pre-bypass. _ Pulmonic Valve: The pulmonic valve appears unchanged from pre-bypass. _ Interatrial Septum: The interatrial septum appears unchanged from pre-bypass. _ Interventricular Septum: The interventricular septum appears unchanged from pre-bypass. _ Pericardium: The pericardium appears unchanged from pre-bypass. PRE-OP FINDINGS  Left Ventricle: The left ventricle has mild-moderately reduced systolic function, with an ejection fraction of 40-45%. The cavity size was normal. There is mild concentric left ventricular hypertrophy.  LV Wall Scoring: Moderate inferor wall hypokinesis, mild-moderate anterolateral/lateral wall hypokinesis.  Right Ventricle: The right ventricle has normal systolic function. The cavity was normal. There is no increase in right ventricular wall thickness. Left Atrium: Left atrial size was normal in size. No left atrial/left atrial appendage thrombus was detected. Right Atrium: Right atrial size was normal in size. Interatrial Septum: No atrial level shunt detected by color flow Doppler. Pericardium: The pericardium was not assessed. Mitral Valve: The mitral valve is normal in structure. Mitral valve regurgitation mild-moderate. There is No evidence of mitral stenosis. Tricuspid Valve: The tricuspid valve was normal in structure. Tricuspid valve regurgitation was not visualized by color flow Doppler. No evidence of tricuspid stenosis is present. Aortic Valve: The aortic valve is tricuspid Aortic valve regurgitation was not visualized by color flow Doppler. There is no stenosis of the aortic valve. Pulmonic Valve: The pulmonic valve was not assessed. Pulmonic valve regurgitation was not assessed by color flow Doppler. Aorta: The aortic root, ascending aorta and aortic arch are normal in size and structure.  Renold Don MD Electronically signed by Renold Don MD Signature Date/Time: 07/04/2022/1:08:22 PM    Final    MR CARDIAC MORPHOLOGY W WO CONTRAST  Result Date:  07/02/2022 CLINICAL DATA:  Clinical question of cardiac viability Study assumes  BSA of 1.90 m2. EXAM: CARDIAC MRI TECHNIQUE: The patient was scanned on a 1.5 Tesla GE magnet. A dedicated cardiac coil was used. Functional imaging was done using Fiesta sequences. 2,3, and 4 chamber views were done to assess for RWMA's. Modified Simpson's rule using a short axis stack was used to calculate an ejection fraction on a dedicated work Conservation officer, nature. The patient received 10 cc of Gadavist. After 10 minutes inversion recovery sequences were used to assess for infiltration and scar tissue. CONTRAST:  10 cc  of Gadavist FINDINGS: 1. Moderate dilation in left ventricular size, with LVEDD 56 mm, but LVEDVi 132 mL/m2. Mild asymmetric septal hypertrophy, with intraventricular septal thickness of 13 mm, posterior wall thickness of 6 mm, but myocardial mass index of 84 g/m2. Severely decreased left ventricular systolic function (  LVEF =29%). There are regional wall motion abnormalities. Basal inferior, inferoseptal and inferolateral hypokinesis. Mid anteroseptal and mid inferolateral and anterolateral hypokinesis. Severe apical hypokinesis without LV thrombus. Left ventricular parametric mapping notable for increase in native T1 in the apical lateral segment (1200 ms) and basal inferolateral segment (1215 ms). Increased in T2 signal increased basal inferoseptal and inferolateral (61 ms) mid inferior and inferolateral (63 ms), and apical inferior and lateral (70 ms). There is late gadolinium enhancement in the left ventricular myocardium- 75% true apical LGE. 2. Normal right ventricular size with RVEDVI 64 mL/m2. Normal right ventricular thickness. Normal right ventricular systolic function (RVEF =73%). There are no regional wall motion abnormalities or aneurysms. 3.  Normal left and right atrial size. 4. Normal size of the aortic root, ascending aorta and pulmonary artery. 5. Valve assessment: Aortic Valve:  Tri-leaflet aortic valve. There is no significant regurgitation, regurgitant fraction 5%. Pulmonic Valve: There is no significant regurgitation, regurgitant fraction 3%. Tricuspid Valve: There is no significant regurgitation. Mitral Valve: There is mild to moderate mitral regurgitation, regurgitant fraction 23%. Mechanism is likely posterior leaflet restriction. 6.  Normal pericardium.  No pericardial effusion. 7. Grossly, There is a subdiaphragmatic artifact of unclear etiology. Small bilateral pleural effusions. Recommended dedicated study if concerned for non-cardiac pathology. IMPRESSION: Study is consistent with multi-vessel disease. Save for the left ventricular true apex; most of the myocardium appears viable. Rudean Haskell MD Electronically Signed   By: Rudean Haskell M.D.   On: 07/02/2022 18:14   MR CARDIAC VELOCITY FLOW MAP  Result Date: 07/02/2022 CLINICAL DATA:  Clinical question of cardiac viability Study assumes  BSA of 1.90 m2. EXAM: CARDIAC MRI TECHNIQUE: The patient was scanned on a 1.5 Tesla GE magnet. A dedicated cardiac coil was used. Functional imaging was done using Fiesta sequences. 2,3, and 4 chamber views were done to assess for RWMA's. Modified Simpson's rule using a short axis stack was used to calculate an ejection fraction on a dedicated work Conservation officer, nature. The patient received 10 cc of Gadavist. After 10 minutes inversion recovery sequences were used to assess for infiltration and scar tissue. CONTRAST:  10 cc  of Gadavist FINDINGS: 1. Moderate dilation in left ventricular size, with LVEDD 56 mm, but LVEDVi 132 mL/m2. Mild asymmetric septal hypertrophy, with intraventricular septal thickness of 13 mm, posterior wall thickness of 6 mm, but myocardial mass index of 84 g/m2. Severely decreased left ventricular systolic function (LVEF =41%). There are regional wall motion abnormalities. Basal inferior, inferoseptal and inferolateral hypokinesis. Mid  anteroseptal and mid inferolateral and anterolateral hypokinesis. Severe apical hypokinesis without LV thrombus. Left ventricular parametric mapping notable for increase in native T1 in the apical lateral segment (1200 ms) and basal inferolateral segment (1215 ms). Increased in T2 signal increased basal inferoseptal and inferolateral (61 ms) mid inferior and inferolateral (63 ms), and apical inferior and lateral (70 ms). There is late gadolinium enhancement in the left ventricular myocardium- 75% true apical LGE. 2. Normal right ventricular size with RVEDVI 64 mL/m2. Normal right ventricular thickness. Normal right ventricular systolic function (RVEF =93%). There are no regional wall motion abnormalities or aneurysms. 3.  Normal left and right atrial size. 4. Normal size of the aortic root, ascending aorta and pulmonary artery. 5. Valve assessment: Aortic Valve: Tri-leaflet aortic valve. There is no significant regurgitation, regurgitant fraction 5%. Pulmonic Valve: There is no significant regurgitation, regurgitant fraction 3%. Tricuspid Valve: There is no significant regurgitation. Mitral Valve: There is mild to moderate mitral  regurgitation, regurgitant fraction 23%. Mechanism is likely posterior leaflet restriction. 6.  Normal pericardium.  No pericardial effusion. 7. Grossly, There is a subdiaphragmatic artifact of unclear etiology. Small bilateral pleural effusions. Recommended dedicated study if concerned for non-cardiac pathology. IMPRESSION: Study is consistent with multi-vessel disease. Save for the left ventricular true apex; most of the myocardium appears viable. Rudean Haskell MD Electronically Signed   By: Rudean Haskell M.D.   On: 07/02/2022 18:14     Scheduled Meds:  aspirin EC  81 mg Oral Daily   Chlorhexidine Gluconate Cloth  6 each Topical Daily   digoxin  0.125 mg Oral Daily   feeding supplement  237 mL Oral BID BM   heparin injection (subcutaneous)  5,000 Units Subcutaneous  Q8H   insulin aspart  0-15 Units Subcutaneous TID WC   insulin aspart  0-5 Units Subcutaneous QHS   insulin aspart  7 Units Subcutaneous TID WC   insulin glargine-yfgn  18 Units Subcutaneous BID   pantoprazole  40 mg Oral Q0600   rosuvastatin  40 mg Oral Daily   sodium chloride flush  3 mL Intravenous Q12H   spironolactone  25 mg Oral Daily   vancomycin (VANCOCIN) 1,000 mg in sodium chloride 0.9 % 1,000 mL irrigation   Irrigation To OR   Continuous Infusions:  sodium chloride     sodium chloride     milrinone Stopped (07/04/22 1030)     LOS: 10 days    Time spent: 12mn    PDomenic Polite MD [facility]   07/04/2022, 1:29 PM

## 2022-07-04 NOTE — Anesthesia Procedure Notes (Addendum)
Central Venous Catheter Insertion Performed by: Audry Pili, MD, anesthesiologist Start/End9/15/2023 7:30 AM, 07/04/2022 7:32 AM Patient location: Pre-op. Preanesthetic checklist: patient identified, IV checked, risks and benefits discussed, surgical consent, monitors and equipment checked, pre-op evaluation, timeout performed and anesthesia consent Position: Trendelenburg Hand hygiene performed  and maximum sterile barriers used  Total catheter length 10. PA cath was placed.Swan type:thermodilution PA Cath depth:55 Procedure performed without using ultrasound guided technique. Attempts: 1 Patient tolerated the procedure well with no immediate complications.

## 2022-07-04 NOTE — Procedures (Signed)
Extubation Procedure Note  Patient Details:   Name: [name] DOB: 11/06/1965 MRN: [id number]   Evaluation  O2 sats: stable throughout Complications: No apparent complications Patient did tolerate procedure well. Bilateral Breath Sounds: Diminished   Yes  Positive cuff leak. Patient placed on 3L [name].  RN at bedside.  Karl Pock 07/04/2022, 8:01 PM

## 2022-07-04 NOTE — Progress Notes (Signed)
Pt stable No issues from yesterday Pt ready for CABG and possible MV surgery Consent signed

## 2022-07-04 NOTE — Transfer of Care (Signed)
Immediate Anesthesia Transfer of Care Note  Patient: [name]  Procedure(s) Performed: CORONARY ARTERY BYPASS GRAFTING (CABG) X4, USING LEFT GREATER SAPHENOUS VEIN. (Chest) TRANSESOPHAGEAL ECHOCARDIOGRAM (TEE)  Patient Location: ICU  Anesthesia Type:General  Level of Consciousness: sedated and Patient remains intubated per anesthesia plan  Airway & Oxygen Therapy: Patient remains intubated per anesthesia plan and Patient placed on Ventilator (see vital sign flow sheet for setting)  Post-op Assessment: Report given to RN and Post -op Vital signs reviewed and stable  Post vital signs: Reviewed and stable  Last Vitals:  Vitals Value Taken Time  BP    Temp 36 C 07/04/22 1336  Pulse 79 07/04/22 1336  Resp 12 07/04/22 1336  SpO2 100 % 07/04/22 1336  Vitals shown include unvalidated device data.  Last Pain:  Vitals:   07/04/22 0458  TempSrc: Oral  PainSc: 0-No pain         Complications: No notable events documented.

## 2022-07-04 NOTE — Progress Notes (Signed)
Patient ID: [name], male   DOB: Nov 24, 1965, 56 y.o.   MRN: [id number]   Advanced Heart Failure Rounding Note  PCP-Cardiologist: None   Subjective:    09/06: Started milrinone 0.125 to facilitate diuresis in setting of low-output 09/07: Milrinone increased to 0.25. 09/09: Milrinone decreased to 0.125  09/11: 1uPRBc  9/12: EGD/ Colo>>Non-obstructing Schatzki ring, hiatal hernia, normal stomach, 2 polyps (resected, path pending), diverticulosis, non bleeding hemorrhoids. VCE pending . 9/13: VCE- no active bleeding, no AVMs, ulcers, mass. 9/14: cMRI EF 29% with viable myocardium. Mild to moderate MR  9/15: s/p CABG x4 (LIMA-LAD RSVG from aorta to RPDA (acute marginal extension), RSVG from aorta to Ramus sequenced to the distal OM. Perioperative TEE showed only mild-mod MR. No indication for MVR.   Minimal EBL. No blood products required during case.   Just returned from OR. Intubated and sedated.  NEO 40 mcg/min. Currently off milrinone. MAPs 90s. RN weaning NEO.   CI 1. 58 CO 3.03 PAP 25/13 (18) Co-ox pending   Getting 2nd albumin. F/u CBC just resulted, hgb down 7.5>>6.5.    Objective:   Weight Range: 70.8 kg Body mass index is 21.17 kg/m.   Vital Signs:   Temp:  [98.2 F (36.8 C)-98.5 F (36.9 C)] 98.2 F (36.8 C) (09/15 0458) Pulse Rate:  [75-157] 82 (09/15 0718) Resp:  [0-27] 20 (09/15 0718) BP: (116-141)/(78-90) 121/78 (09/15 0715) SpO2:  [99 %-100 %] 100 % (09/15 0718) Weight:  [70.8 kg] 70.8 kg (09/15 0458) Last BM Date : 07/01/22  Weight change: Filed Weights   07/01/22 1248 07/02/22 0400 07/04/22 0458  Weight: 72.6 kg 71.4 kg 70.8 kg    Intake/Output:   Intake/Output Summary (Last 24 hours) at 07/04/2022 1419 Last data filed at 07/04/2022 1400 Gross per 24 hour  Intake 2985.66 ml  Output 4675 ml  Net -1689.34 ml     Physical Exam   General:  intubated and sedated  HEENT: + ETT normal Neck: supple. JVD elevated. Carotids 2+ bilat; no  bruits. No lymphadenopathy or thyromegaly appreciated. Cor: PMI nondisplaced. RRR. + sternal dressing, + CTs  Lungs: intubated and clear  Abdomen: soft, nontender, nondistended. No hepatosplenomegaly. No bruits or masses. Good bowel sounds. Extremities: no cyanosis, clubbing, rash, edema Neuro: intubated and sedated  GU: + Foley     Telemetry   NSR 80s   Labs    CBC Recent Labs    07/03/22 0550 07/04/22 0430 07/04/22 0820 07/04/22 1125 07/04/22 1130 07/04/22 1220 07/04/22 1223  WBC 10.0 9.0  --   --   --   --   --   HGB 9.0* 8.5*   < > 5.4*   < > 7.1* 7.8*  HCT 29.6* 27.9*   < > 17.5*   < > 21.0* 23.0*  MCV 78.5* 79.0*  --   --   --   --   --   PLT 572* 560*  --  399  --   --   --    < > = values in this interval not displayed.   Basic Metabolic Panel Recent Labs    07/02/22 0535 07/03/22 0550 07/04/22 0430 07/04/22 0820 07/04/22 1155 07/04/22 1220 07/04/22 1223  NA 134* 134* 134*   < > 133* 135 135  K 4.1 4.7 4.7   < > 6.7* 5.6* 5.6*  CL 98 100 97*   < > 99  --  100  CO2 '26 26 28  '$ --   --   --   --  GLUCOSE 153* 237* 189*   < > 141*  --  140*  BUN '7 10 10   '$ < > 6  --  5*  CREATININE 0.67 0.80 0.76   < > 0.40*  --  0.40*  CALCIUM 9.1 9.6 10.1  --   --   --   --   MG 1.8  --   --   --   --   --   --    < > = values in this interval not displayed.   Liver Function Tests No results for input(s): "AST", "ALT", "ALKPHOS", "BILITOT", "PROT", "ALBUMIN" in the last 72 hours.  No results for input(s): "LIPASE", "AMYLASE" in the last 72 hours. Cardiac Enzymes No results for input(s): "CKTOTAL", "CKMB", "CKMBINDEX", "TROPONINI" in the last 72 hours.  BNP: BNP (last 3 results) Recent Labs    06/24/22 0809 06/25/22 0133  BNP 639.0* 830.5*    ProBNP (last 3 results) No results for input(s): "PROBNP" in the last 8760 hours.   D-Dimer No results for input(s): "DDIMER" in the last 72 hours. Hemoglobin A1C Recent Labs    07/03/22 1935  HGBA1C 7.9*     Fasting Lipid Panel No results for input(s): "CHOL", "HDL", "LDLCALC", "TRIG", "CHOLHDL", "LDLDIRECT" in the last 72 hours.  Thyroid Function Tests No results for input(s): "TSH", "T4TOTAL", "T3FREE", "THYROIDAB" in the last 72 hours.  Invalid input(s): "FREET3"  Other results:   Imaging    DG Chest Port 1 View  Result Date: 07/04/2022 CLINICAL DATA:  Postop CABG EXAM: PORTABLE CHEST 1 VIEW COMPARISON:  Chest 06/29/2022 FINDINGS: Postop CABG. Endotracheal tube in good position. Swan-Ganz catheter right pulmonary artery. NG tube in the stomach. Left chest tube in good position.  No pneumothorax. Lungs are well aerated and clear. No infiltrate or collapse. No effusion. IMPRESSION: Postop CABG.  Lungs are well aerated.  No acute abnormality. Electronically Signed   By: Franchot Gallo M.D.   On: 07/04/2022 14:03   ECHO INTRAOPERATIVE TEE  Result Date: 07/04/2022  *INTRAOPERATIVE TRANSESOPHAGEAL REPORT *  Patient Name:   [name] Date of Exam: 07/04/2022 Medical Rec #:  [id number]            Height:       72.0 in Accession #:    [id number]           Weight:       156.1 lb Date of Birth:  December 11, 1965             BSA:          1.92 m Patient Age:    56 years             BP:           121/78 mmHg Patient Gender: M                    HR:           78 bpm. Exam Location:  Anesthesiology Transesophogeal exam was perform intraoperatively during surgical procedure. Patient was closely monitored under general anesthesia during the entirety of examination. Indications:     Mitral Regurgitation i34.0; CAD Native Vessel i25.10 Sonographer:     Raquel Sarna Senior RDCS Performing Phys: Renold Don MD Diagnosing Phys: Renold Don MD Complications: No known complications during this procedure. POST-OP IMPRESSIONS _ Left Ventricle: The wall motion is abnormal with regional variation. Slightly improved lateral wall motion. Inferior wall unchanged. _ Right Ventricle: The right ventricle appears unchanged  from  pre-bypass. _ Aorta: The aorta appears unchanged from pre-bypass. _ Left Atrium: The left atrium appears unchanged from pre-bypass. _ Left Atrial Appendage: The left atrial appendage appears unchanged from pre-bypass. _ Aortic Valve: The aortic valve appears unchanged from pre-bypass. _ Mitral Valve: The mitral valve appears unchanged from pre-bypass. _ Tricuspid Valve: The tricuspid valve appears unchanged from pre-bypass. _ Pulmonic Valve: The pulmonic valve appears unchanged from pre-bypass. _ Interatrial Septum: The interatrial septum appears unchanged from pre-bypass. _ Interventricular Septum: The interventricular septum appears unchanged from pre-bypass. _ Pericardium: The pericardium appears unchanged from pre-bypass. PRE-OP FINDINGS  Left Ventricle: The left ventricle has mild-moderately reduced systolic function, with an ejection fraction of 40-45%. The cavity size was normal. There is mild concentric left ventricular hypertrophy.  LV Wall Scoring: Moderate inferor wall hypokinesis, mild-moderate anterolateral/lateral wall hypokinesis.  Right Ventricle: The right ventricle has normal systolic function. The cavity was normal. There is no increase in right ventricular wall thickness. Left Atrium: Left atrial size was normal in size. No left atrial/left atrial appendage thrombus was detected. Right Atrium: Right atrial size was normal in size. Interatrial Septum: No atrial level shunt detected by color flow Doppler. Pericardium: The pericardium was not assessed. Mitral Valve: The mitral valve is normal in structure. Mitral valve regurgitation mild-moderate. There is No evidence of mitral stenosis. Tricuspid Valve: The tricuspid valve was normal in structure. Tricuspid valve regurgitation was not visualized by color flow Doppler. No evidence of tricuspid stenosis is present. Aortic Valve: The aortic valve is tricuspid Aortic valve regurgitation was not visualized by color flow Doppler. There is no stenosis of  the aortic valve. Pulmonic Valve: The pulmonic valve was not assessed. Pulmonic valve regurgitation was not assessed by color flow Doppler. Aorta: The aortic root, ascending aorta and aortic arch are normal in size and structure.  Renold Don MD Electronically signed by Renold Don MD Signature Date/Time: 07/04/2022/1:08:22 PM    Final      Medications:     Scheduled Medications:  [START ON 07/05/2022] acetaminophen  1,000 mg Oral Q6H   Or   [START ON 07/05/2022] acetaminophen (TYLENOL) oral liquid 160 mg/5 mL  1,000 mg Per Tube Q6H   acetaminophen (TYLENOL) oral liquid 160 mg/5 mL  650 mg Per Tube Once   Or   acetaminophen  650 mg Rectal Once   amiodarone  400 mg Oral BID   aspirin EC  325 mg Oral Daily   Or   aspirin  324 mg Per Tube Daily   [START ON 07/05/2022] aspirin EC  81 mg Oral Daily   [START ON 07/05/2022] bisacodyl  10 mg Oral Daily   Or   [START ON 07/05/2022] bisacodyl  10 mg Rectal Daily   chlorhexidine  15 mL Mouth/Throat NOW   [START ON 07/05/2022] Chlorhexidine Gluconate Cloth  6 each Topical Q0600   [START ON 07/05/2022] clopidogrel  75 mg Oral Daily   [START ON 07/05/2022] docusate sodium  200 mg Oral Daily   metoprolol tartrate  12.5 mg Oral BID   Or   metoprolol tartrate  12.5 mg Per Tube BID   [START ON 07/06/2022] pantoprazole  40 mg Oral Daily   rosuvastatin  40 mg Oral Daily   [START ON 07/05/2022] sodium chloride flush  3 mL Intravenous Q12H    Infusions:  sodium chloride 20 mL/hr at 07/04/22 1413   [START ON 07/05/2022] sodium chloride     sodium chloride     albumin human  ceFAZolin (ANCEF) IV     dexmedetomidine (PRECEDEX) IV infusion 0.7 mcg/kg/hr (07/04/22 1400)   famotidine (PEPCID) IV 20 mg (07/04/22 1413)   insulin 0.5 Units/hr (07/04/22 1400)   lactated ringers     lactated ringers     lactated ringers 20 mL/hr at 07/04/22 1400   magnesium sulfate     milrinone Stopped (07/04/22 1405)   nitroGLYCERIN     phenylephrine (NEO-SYNEPHRINE)  Adult infusion 40 mcg/min (07/04/22 1400)   potassium chloride     vancomycin      PRN Medications: sodium chloride, albumin human, dextrose, lactated ringers, metoprolol tartrate, midazolam, morphine injection, ondansetron (ZOFRAN) IV, oxyCODONE, [START ON 07/05/2022] sodium chloride flush, traMADol    Patient Profile   56 y/o AAM w/ h/o multivessel CAD s/p remote MI in 2012 and multiple PCIs to LAD, diag, ramus and OM vessels, HTN, HLD, Type 2 DM and tobacco use, admitted for NSTEMI and acute CHF. 2D Echo and TEE w/ severe mitral regurgitation. LVEF 40-45%, RV ok. Cath w/ multivessel CAD including high grade LM disease, elevated filling pressures and preserved CO. S/p CABG x 4 on 9/15.    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 No indication for MVR   2.  Acute on chronic systolic CHF/Ischemic cardiomyopathy - Echo with EF 40-45%.  - LHC w/ MVCAD  - RHC w/ RA 8, PA 48/23 (38), PCWP mean 26, LVEDP 21,  PA sat 49% - Optimized w/ milrinone prior to CABG - S/p CABG x 4 today  - on NEO 40. Currently off Milrinone. CI 1.58. Co-ox pending. Given LV dysfunction, would prefer weaning off NEO and using NE if needed for BP control - closely follow hemodynamics on swan. No diuretics yet   - may need resumption of milrinone if low co-ox  - resumption of GDMT once stable and off pressors   3. NSTEMI w/ Multivessel CAD  - Known hx prior CAD with MI in 2012 and prior multivessel PCI - HS trop peak 1,477 - LHC w/ severe MVCAD including high grade LM disease  - cMRI w/ viable myocardium  - s/p CABG x 4  - ASA + high intensity statin    4. Type 2DM  - per primary team  - A1c 8.2%  - Eventual SGLT2i   5. IDA + EBLA  - IDA at baseline, required transfusion on admit - no obvious  source blood loss, FOBT negative.  - Iron  21, sats 4%, treated w/ IV feraheme - GI consulted. EGD/ Colo>>Non-obstructing Schatzki ring, hiatal hernia, normal stomach, 2 polyps (resected, path pending), diverticulosis, non bleeding hemorrhoids - VCE: no active bleeding, no AVMs, ulcers, mass. - Now w/ EBLA from CABG, Hgb 6.2. Transfuse 2u RBCs per CT surgery   - follow CBC closely   6. ETOH and tobacco abuse - cessation advised   7. ID - CT chest with pulmonary edema  - Cough improved with diuresis.  - completed course of azithromycin. - Suspect CHF was cause of cough etc.     Length of Stay: [address], PA-C  07/04/2022, 2:19 PM  Advanced Heart Failure Team Pager [phone] (M-F; 7a - 5p)  Please contact [facility] for night-coverage after hours (5p -7a ) and weekends on [email or web address]

## 2022-07-04 NOTE — Anesthesia Procedure Notes (Signed)
Arterial Line Insertion Start/End9/15/2023 7:00 AM, 07/04/2022 7:10 AM Performed by: Dorann Lodge, CRNA, CRNA  Patient location: Pre-op. Preanesthetic checklist: patient identified, IV checked, site marked, risks and benefits discussed, surgical consent, monitors and equipment checked, pre-op evaluation, timeout performed and anesthesia consent Lidocaine 1% used for infiltration Left, radial was placed Catheter size: 20 G Hand hygiene performed  and maximum sterile barriers used   Attempts: 1 Procedure performed without using ultrasound guided technique. Following insertion, dressing applied and Biopatch. Post procedure assessment: normal and unchanged  Patient tolerated the procedure well with no immediate complications.

## 2022-07-04 NOTE — Anesthesia Postprocedure Evaluation (Signed)
Anesthesia Post Note  Patient: [name]  Procedure(s) Performed: CORONARY ARTERY BYPASS GRAFTING (CABG) X4, USING LEFT GREATER SAPHENOUS VEIN. (Chest) TRANSESOPHAGEAL ECHOCARDIOGRAM (TEE)     Patient location during evaluation: ICU Anesthesia Type: General Level of consciousness: sedated and patient remains intubated per anesthesia plan Pain management: pain level controlled Vital Signs Assessment: post-procedure vital signs reviewed and stable Respiratory status: patient remains intubated per anesthesia plan Cardiovascular status: stable Postop Assessment: no apparent nausea or vomiting Anesthetic complications: no   No notable events documented.  Last Vitals:  Vitals:   07/04/22 0717 07/04/22 0718  BP:    Pulse: 82 82  Resp: 15 20  Temp:    SpO2: 100% 100%    Last Pain:  Vitals:   07/04/22 0458  TempSrc: Oral  PainSc: 0-No pain                 Audry Pili

## 2022-07-04 NOTE — Progress Notes (Signed)
Walked with patient to OR and gave verbal report to [employer], Immunologist. Patient took Lopressor 12.'5mg'$  PO as ordered. Protonix PO was held,and Heparin SQ was held. Patient voided 375 of urine on way to OR. Milrinone infusing at 0.125 mcg/kg/min via pump. Dressing was changed this morning on patients R IJ. Safety set attached. CVP's were 2,2,and 3 this morning. Foam dressing was applied to patient's rectum. All personal belonging will be sent to ICU this morning. Patient is aware.

## 2022-07-04 NOTE — Progress Notes (Signed)
1445: hgb 6.5/ hct 21.3; Called Dr. Lavonna Monarch. One unit of PRBC ordered.  1550: Dr. Lavonna Monarch by bedside. CO 2.98 CI 1.55; He is okay to titrate down precedex and progress towards extubation.

## 2022-07-04 NOTE — Hospital Course (Addendum)
History of Present Illness:  [name] is a 56 yo AA male with known history of DM, HTN, Dyslipidemia,  Nicotine abuse for > 30 years, currently 5-6 per day, Alcohol use 3-4 x per week, and CAD with previous MI with subsequent stent placement in 2012 to LAD, Diagonal, Ramus, and OM.  He takes all home medications as prescribed.  The patient presented to the Emergency Department on 9/5 with complaints of chest pain that started on Sunday 9/3.  However, the patient stated he has been experiencing pain for approximately 1 month, but it has gotten worse.  The symptoms started at rest while patient was watching football.  The pain occurred in the center of his chest with belching with associated shortness of breath, nausea/vomiting.  Workup in the ED showed reduced Oxygen saturations with improvement after 5L of oxygen.  CXR obtained showed evidence of vascular congestion.  Lab work was abnormal with elevated BNP at 639.  Troponin was at 139 but repeat was down to 106.  He was also noted to be anemic and patient admitted to experiencing black stools.  He has lower extremity edema as well.  He was treated with Lasix, NTG was not administered due to use of Viagra the night prior to presentation.  He was transferred to [facility] for further evaluation.    Hospital Course:  Cardiology consult was obtained who ruled patient in for NSTEMI and Acute systolic HF/Ischemic Cardiomyopathy.  They recommended cardiac catheterization and TEE. TEE showed a reduced EF of 40-45%, severe severe mid to distal anterior, anteroapical and anterolateral hypokinesis and basal to mid inferior/inferolateral hypokinesis suggestive of multivessel territory ischemia/infarct and Moderate to severe Mitral Valve regurgitation. Catheterization performed on 9/6 showed advanced multivessel cor Neri artery disease with severe distal left main disease, very severe ostial circumflex stenosis, subtotal occlusion of the mid circumflex with  occlusion of the OM branches, severe in-stent restenosis in the mid LAD, diffuse distal LAD disease, and severe mid RCA stenosis.  It was felt advanced heart failure team consult would be beneficial for medical optimization prior to surgical consultation.  He was evaluated by Dr. Haroldine Laws who initiated Milrinone for reduced PA pressures.  He was started on Aldactone and Entresto for GDMT.  They felt cardiac MRI would be needed to assess cardiac viability, this has not yet been completed.  They have closely monitored patient throughout his stay.  His blood sugars have been uncontrolled and diabetes coordinator has been assisting in insulin adjustment.  He was found to be anemic and has required blood transfusion with hemoglobin of 6.9 this morning.  He was started on antibiotics 9/7 for fever and suspected pneumonia with Rocephin and Zithromax.  The patient was evaluated by Dr. Lavonna Monarch for bypass surgery. At which time the patient stated he felt better overall since admission.  He denies chest pain and shortness of breath.  He does have chronic numbness of his right leg from previous back procedure.  He asks if vein can be taken from his left leg. It was felt patient would be a candidate for surgery.  The risks and benefits of the procedure were explained to the patient and he was agreeable to proceed..  GI consult was obtained prior to surgery due to anemia and transfusion for blood loss on admission.  They performed EGD/Colonoscopy with polyp removal.  They felt the patient was stable to proceed with surgery.  [name] was taken to the operating room on 07/04/2022.  He underwent CABG  x 4 with the left internal mammary artery grafted to the left anterior descending coronary artery.  A saphenous graft was placed to the acute marginal branch of the right coronary artery and a sequential saphenous vein graft was placed to the ramus intermediate and distal obtuse marginal coronary arteries. Following the procedure,  he separated from cardiopulmonary bypass without any difficulty and without inotropic support.  He was transferred to the ICU in stable condition on Precedex and Neo-Synephrine. He was extubated the evening of surgery. Milrinone was added as CO/CI low and co ox were monitored daily. Advanced heart failure followed post op for management of acute on chronic systolic CHF/ischemic.cardiomyopathy. Gordy Councilman, a line, chest tubes, and foley were removed early in his post operative course. He was weaned off the Insulin drip. He has a history of diabetes and pre op HGA1C was 7.9. He will need close medical follow up after discharge. He was put on Carvedilol and given Feraheme for ABL anemia.

## 2022-07-04 NOTE — Progress Notes (Addendum)
NIF -20 VC .7 L  Great patient effort.

## 2022-07-04 NOTE — Progress Notes (Signed)
[address]       [place] [address]             [phone]      Day of Surgery  Procedure(s) (LRB): CORONARY ARTERY BYPASS GRAFTING (CABG) X4, USING LEFT GREATER SAPHENOUS VEIN. (N/A) TRANSESOPHAGEAL ECHOCARDIOGRAM (TEE) (N/A)   Total Length of Stay:  LOS: 10 days    SUBJECTIVE: stable hemodynamics. Not bleeding  Vitals:   07/04/22 1602 07/04/22 1615  BP:    Pulse:  92  Resp: 12 12  Temp: 98.2 F (36.8 C) 98.6 F (37 C)  SpO2:  100%    Intake/Output      09/14 0701 09/15 0700 09/15 0701 09/16 0700   P.O.     I.V. (mL/kg)  1990.9 (28.1)   Blood  652   IV Piggyback  805.1   Total Intake(mL/kg)  3448 (48.7)   Urine (mL/kg/hr) 1250 (0.7) 2700 (3.9)   Stool     Blood  800   Chest Tube  300   Total Output 1250 3800   Net -1250 -352            sodium chloride Stopped (07/04/22 1454)   [START ON 07/05/2022] sodium chloride     sodium chloride     albumin human 12.5 g (07/04/22 1636)    ceFAZolin (ANCEF) IV 200 mL/hr at 07/04/22 1500   dexmedetomidine (PRECEDEX) IV infusion 0.7 mcg/kg/hr (07/04/22 1500)   famotidine (PEPCID) IV Stopped (07/04/22 1443)   insulin 0.5 Units/hr (07/04/22 1500)   lactated ringers     lactated ringers     lactated ringers 20 mL/hr at 07/04/22 1500   magnesium sulfate     milrinone Stopped (07/04/22 1405)   nitroGLYCERIN     phenylephrine (NEO-SYNEPHRINE) Adult infusion 30 mcg/min (07/04/22 1500)   potassium chloride     vancomycin      CBC    Component Value Date/Time   WBC 20.0 (H) 07/04/2022 1340   RBC 2.70 (L) 07/04/2022 1340   HGB 7.5 (L) 07/04/2022 1347   HCT 22.0 (L) 07/04/2022 1347   PLT 390 07/04/2022 1340   MCV 78.9 (L) 07/04/2022 1340   MCH 24.1 (L) 07/04/2022 1340   MCHC 30.5 07/04/2022 1340   RDW 21.0 (H) 07/04/2022 1340   LYMPHSABS 1.4 04/07/2020 0507   MONOABS 0.9 04/07/2020 0507   EOSABS 0.0 04/07/2020 0507   BASOSABS 0.0 04/07/2020 0507   CMP     Component Value  Date/Time   NA 136 07/04/2022 1347   K 4.9 07/04/2022 1347   CL 100 07/04/2022 1223   CO2 28 07/04/2022 0430   GLUCOSE 140 (H) 07/04/2022 1223   BUN 5 (L) 07/04/2022 1223   CREATININE 0.40 (L) 07/04/2022 1223   CALCIUM 10.1 07/04/2022 0430   PROT 7.1 07/01/2022 0425   ALBUMIN 2.3 (L) 07/01/2022 0425   AST 335 (H) 07/01/2022 0425   ALT 231 (H) 07/01/2022 0425   ALKPHOS 123 07/01/2022 0425   BILITOT 0.7 07/01/2022 0425   GFRNONAA >60 07/04/2022 0430   GFRAA >60 04/07/2020 1052   ABG    Component Value Date/Time   PHART 7.435 07/04/2022 1347   PCO2ART 36.4 07/04/2022 1347   PO2ART 155 (H) 07/04/2022 1347   HCO3 24.7 07/04/2022 1347   TCO2 26 07/04/2022 1347   O2SAT 99 07/04/2022 1347   CBG (last 3)  Recent Labs    07/04/22 1344 07/04/22 1500 07/04/22 1610  GLUCAP 100* 95  110*     ASSESSMENT: POD #0 Cabg x 4  Doing well. Will wean to extubate. Needed PRBC and with low filling pressures and good perfusion despite CI will continue to wean and support with volume  Coralie Common, MD '@DATE'$ @

## 2022-07-04 NOTE — Brief Op Note (Addendum)
06/24/2022 - 07/04/2022  11:44 AM  PATIENT:  [name]  56 y.o. male  PRE-OPERATIVE DIAGNOSIS:  Coronary artery disease, Acute NSTEMI, ischemic cardiomyopathy with systolic heart failure and mitral insufficiency  POST-OPERATIVE DIAGNOSIS:  Coronary artery disease, Acute NSTEMI, ischemic cardiomyopathy with systolic heart failure and mild mitral insufficiency  PROCEDURE:   CORONARY ARTERY BYPASS GRAFTING x 4, USING LEFT INTERNAL MAMMARY ARTERY AND LEFT ENDOSCOPICALLY HARVESTED GREATER SAPHENOUS VEIN  LIMA->LAD SVG->RAMUS->distal OM (sequential grafts) SVG->Acute Marginal branch of RCA  TRANSESOPHAGEAL ECHOCARDIOGRAM (TEE) (N/A) Vein harvest time: 74mn Vein prep time: 25m  SURGEON:  WeCoralie CommonMD - Primary  PHYSICIAN ASSISTANTS: RoLuetta NuttingASSISTANTS: HaSammie BenchRN, RN First Assistant AcJonetta SpeakDiBurnett KanarisRN, Scrub Person   ANESTHESIA:   general  EBL:  15084m BLOOD ADMINISTERED: None  DRAINS:  Mediastinal and pleural drains    LOCAL MEDICATIONS USED:  NONE  SPECIMEN:  No Specimen  DISPOSITION OF SPECIMEN:  N/A  COUNTS:  YES  DICTATION: .Dragon Dictation  PLAN OF CARE: Admit to inpatient   PATIENT DISPOSITION:  ICU - intubated and hemodynamically stable.   Delay start of Pharmacological VTE agent (>24hrs) due to surgical blood loss or risk of bleeding: yes

## 2022-07-04 NOTE — Anesthesia Procedure Notes (Signed)
Procedure Name: Intubation Date/Time: 07/04/2022 8:16 AM  Performed by: Dorann Lodge, CRNAPre-anesthesia Checklist: Patient identified, Emergency Drugs available, Suction available and Patient being monitored Patient Re-evaluated:Patient Re-evaluated prior to induction Oxygen Delivery Method: Circle System Utilized Preoxygenation: Pre-oxygenation with 100% oxygen Induction Type: IV induction Ventilation: Mask ventilation without difficulty Laryngoscope Size: Mac and 4 Grade View: Grade I Tube type: Oral Tube size: 8.0 mm Number of attempts: 1 Airway Equipment and Method: Stylet Placement Confirmation: ETT inserted through vocal cords under direct vision, positive ETCO2 and breath sounds checked- equal and bilateral Secured at: 23 cm Tube secured with: Tape Dental Injury: Teeth and Oropharynx as per pre-operative assessment

## 2022-07-04 NOTE — Op Note (Signed)
[address]       [place] [address]             [phone]                                          07/04/2022 Patient:  [name] Pre-Op Dx: Ischemic Cardiomyopathy with recent NSTEMI complicated with acute systolic CHF and moderate MR, CAD   Post-op Dx:  Same but no significant MR Procedure: CABG X 4 with LIMA to LAD and RSVG from aorta to RPDA (acute marginal extension), RSVG from aorta to Ramus sequenced to the distal OM  Endoscopic greater saphenous vein harvest on the Left leg   Surgeon and Role:      Coralie Common, MD- Primary    * Enid Cutter , PA-C - assisting An experienced assistant was required given the complexity of this surgery and the standard of surgical care. The assistant was needed for exposure, dissection, suctioning, retraction of delicate tissues and sutures, instrument exchange and for overall help during this procedure and saphenous vein harvesting   Anesthesia  general EBL:  minimal ml Blood Administration: none Xclamp Time:  75 min Pump Time:  1455mn  Drains: Chest tubes x 3 to posterior mediastinum, anterior mediastinum and left pleural space Wires: atrial and ventricular Counts: correct   Indications: Pt has been admitted in diastolic acute congestive heart failure secondary to NSTEMI and has been succesfully medically managed with excellent diuresis and improved hemodynamics. Pt has been found to have severe diffuse MV CAD with poor targets however the LAD, DIAG, distal RCA, and distal OM are potential. I did not hear much of a murmur on exam yesterday and awaiting the MRI for final surgical planning. Will await the findings on the GI endoscopy procedure tomorrow and schedule for surgery following that Findings: By TEE with appropriate loading conditions and SBP over 110 the mitral valve was only mildly regurgitant. The EF was 45% and at the end of procedure the MR was mild to moderate central and EF still  45%. All of the vessels were extremely calcified with the LAD having a 1.516mlumen with diffuse disease throughout. The PDA appeared to be an extension of the acute marginal and was a a 1.5 mm vessel and diffuse disease. Both the ramus and distal om were 1.55m7messels with severe disease. The LIMA and SVG were excellent vessels  Operative Technique: All invasive lines were placed in pre-op holding.  After the risks, benefits and alternatives were thoroughly discussed, the patient was brought to the operative theatre.  Anesthesia was induced, and the patient was prepped and draped in normal sterile fashion.  An appropriate surgical pause was performed, and pre-operative antibiotics were dosed accordingly.  We began with simultaneous incisions along the left leg for harvesting of the greater saphenous vein and the chest for the sternotomy.  In regards to the sternotomy, this was carried down with bovie cautery, and the sternum was divided with a reciprocating saw.  Meticulous hemostasis was obtained.  The left internal thoracic artery was exposed and harvested in in pedicled fashion.  The patient was systemically heparinized, and the artery was divided distally, and placed in a papaverine sponge.    The sternal elevator was removed, and a retractor was placed.  The pericardium was divided in the midline and fashioned into a  cradle with pericardial stitches.   After we confirmed an appropriate ACT, the ascending aorta was cannulated in standard fashion.  The right atrial appendage was used for venous cannulation site.  Cardiopulmonary bypass was initiated, and the heart retractor was placed. Antegrade and retrograde cardioplegia cannulas were placed.The cross clamp was applied, and a dose of anterograde cardioplegia was given with good arrest of the heart.and a total of 5 min of cardioplegia was then delivered antegrade and retrograde. Additional doses were given every 20 min via the retrograde cannula (which  was not consistent), antegrade and via the OM/RAMUS graft as needed.  We moved to the posterior wall of the heart, and found a good target on the PDA.  An arteriotomy was made, and the vein graft was anastomosed to it in an end to side fashion.  Next we exposed the lateral wall, and found a good target on the OM.  An end to side anastomosis with the vein graft was then created. We then opened the Ramus artery and constructed a side to side anastomosis to the SVG to the OM and they were flushed and deaired in the usual fashion. Next, we exposed the anterior wall of the heart and identified a good target on LAD.   We began to re-warm, and a re-animation dose of cardioplegia was given.  The heart was de-aired, and the cross clamp was removed.  Meticulous hemostasis was obtained.    A partial occludding clamp was then placed on the ascending aorta, and we created an end to side anastomosis between it and the proximal vein graft to the PDA and OM/RAMUS.  Rings were placed on the proximal anastomosis.  The clamp was removed and the proximal and distal anastamosis were examined and were hemostatic. Atrial and ventricular pacing wires were placed and brought out through inferior stab wounds. Hemostasis was obtained, and we separated from cardiopulmonary bypass without event.  The heparin was reversed with protamine.  Chest tubes and wires were placed, and the sternum was re-approximated with sternal wires.  The soft tissue and skin were re-approximated wth absorbable suture.    The patient tolerated the procedure without any immediate complications, and was transferred to the ICU in guarded condition.  Coralie Common MD

## 2022-07-04 NOTE — Anesthesia Procedure Notes (Signed)
Central Venous Catheter Insertion Performed by: Audry Pili, MD, anesthesiologist Start/End9/15/2023 7:21 AM, 07/04/2022 7:32 AM Preanesthetic checklist: patient identified, IV checked, risks and benefits discussed, surgical consent, monitors and equipment checked, pre-op evaluation, timeout performed and anesthesia consent Position: Trendelenburg Lidocaine 1% used for infiltration and patient sedated Hand hygiene performed , maximum sterile barriers used  and Seldinger technique used Catheter size: 8.5 Fr Central line was placed.Sheath introducer Procedure performed using ultrasound guided technique. Ultrasound Notes:anatomy identified, needle tip was noted to be adjacent to the nerve/plexus identified, no ultrasound evidence of intravascular and/or intraneural injection and image(s) printed for medical record Attempts: 1 Following insertion, line sutured, dressing applied and Biopatch. Post procedure assessment: blood return through all ports, free fluid flow and no air  Patient tolerated the procedure well with no immediate complications.

## 2022-07-04 NOTE — Discharge Instructions (Signed)

## 2022-07-05 ENCOUNTER — Inpatient Hospital Stay (HOSPITAL_COMMUNITY): Payer: No Typology Code available for payment source

## 2022-07-05 DIAGNOSIS — Z951 Presence of aortocoronary bypass graft: Secondary | ICD-10-CM

## 2022-07-05 LAB — CBC
HCT: 24.3 % — ABNORMAL LOW (ref 39.0–52.0)
HCT: 24.2 % — ABNORMAL LOW (ref 39.0–52.0)
Hemoglobin: 7.7 g/dL — ABNORMAL LOW (ref 13.0–17.0)
Hemoglobin: 7.6 g/dL — ABNORMAL LOW (ref 13.0–17.0)
MCH: 25.9 pg — ABNORMAL LOW (ref 26.0–34.0)
MCH: 25.5 pg — ABNORMAL LOW (ref 26.0–34.0)
MCHC: 31.7 g/dL (ref 30.0–36.0)
MCHC: 31.4 g/dL (ref 30.0–36.0)
MCV: 81.8 fL (ref 80.0–100.0)
MCV: 81.2 fL (ref 80.0–100.0)
Platelets: 359 10*3/uL (ref 150–400)
Platelets: 356 10*3/uL (ref 150–400)
RBC: 2.97 MIL/uL — ABNORMAL LOW (ref 4.22–5.81)
RBC: 2.98 MIL/uL — ABNORMAL LOW (ref 4.22–5.81)
RDW: 18.8 % — ABNORMAL HIGH (ref 11.5–15.5)
RDW: 19 % — ABNORMAL HIGH (ref 11.5–15.5)
WBC: 14.6 10*3/uL — ABNORMAL HIGH (ref 4.0–10.5)
WBC: 18.1 10*3/uL — ABNORMAL HIGH (ref 4.0–10.5)
nRBC: 0 % (ref 0.0–0.2)
nRBC: 0 % (ref 0.0–0.2)

## 2022-07-05 LAB — COOXEMETRY PANEL
Carboxyhemoglobin: 1.2 % (ref 0.5–1.5)
Methemoglobin: <0.7 % (ref 0.0–1.5)
O2 Saturation: 54.3 %
Total hemoglobin: 7.9 g/dL — ABNORMAL LOW (ref 12.0–16.0)

## 2022-07-05 LAB — BASIC METABOLIC PANEL
Anion gap: 6 (ref 5–15)
Anion gap: 10 (ref 5–15)
BUN: 7 mg/dL (ref 6–20)
BUN: 9 mg/dL (ref 6–20)
CO2: 24 mmol/L (ref 22–32)
CO2: 21 mmol/L — ABNORMAL LOW (ref 22–32)
Calcium: 8.7 mg/dL — ABNORMAL LOW (ref 8.9–10.3)
Calcium: 8.9 mg/dL (ref 8.9–10.3)
Chloride: 103 mmol/L (ref 98–111)
Chloride: 99 mmol/L (ref 98–111)
Creatinine, Ser: 0.76 mg/dL (ref 0.61–1.24)
Creatinine, Ser: 0.87 mg/dL (ref 0.61–1.24)
GFR, Estimated: >60 mL/min (ref >60)
GFR, Estimated: >60 mL/min (ref >60)
Glucose, Bld: 129 mg/dL — ABNORMAL HIGH (ref 70–99)
Glucose, Bld: 178 mg/dL — ABNORMAL HIGH (ref 70–99)
Potassium: 4.8 mmol/L (ref 3.5–5.1)
Potassium: 4.5 mmol/L (ref 3.5–5.1)
Sodium: 133 mmol/L — ABNORMAL LOW (ref 135–145)
Sodium: 130 mmol/L — ABNORMAL LOW (ref 135–145)

## 2022-07-05 LAB — GLUCOSE, CAPILLARY
Glucose-Capillary: 110 mg/dL — ABNORMAL HIGH (ref 70–99)
Glucose-Capillary: 122 mg/dL — ABNORMAL HIGH (ref 70–99)
Glucose-Capillary: 148 mg/dL — ABNORMAL HIGH (ref 70–99)
Glucose-Capillary: 154 mg/dL — ABNORMAL HIGH (ref 70–99)
Glucose-Capillary: 159 mg/dL — ABNORMAL HIGH (ref 70–99)
Glucose-Capillary: 168 mg/dL — ABNORMAL HIGH (ref 70–99)
Glucose-Capillary: 182 mg/dL — ABNORMAL HIGH (ref 70–99)

## 2022-07-05 LAB — MAGNESIUM
Magnesium: 2 mg/dL (ref 1.7–2.4)
Magnesium: 1.7 mg/dL (ref 1.7–2.4)

## 2022-07-05 MED ORDER — AMIODARONE HCL 200 MG PO TABS
400.0000 mg | ORAL_TABLET | Freq: Two times a day (BID) | ORAL | Status: DC
  Administered 2022-07-05 – 2022-07-06 (×4): 400 mg via ORAL
  Filled 2022-07-05 (×3): qty 2

## 2022-07-05 MED ORDER — SODIUM CHLORIDE 0.9 % IV SOLN
250.0000 mg | Freq: Once | INTRAVENOUS | Status: AC
  Administered 2022-07-05: 250 mg via INTRAVENOUS
  Filled 2022-07-05: qty 20

## 2022-07-05 MED ORDER — FUROSEMIDE 10 MG/ML IJ SOLN
20.0000 mg | Freq: Once | INTRAMUSCULAR | Status: AC
  Administered 2022-07-05: 20 mg via INTRAVENOUS
  Filled 2022-07-05: qty 2

## 2022-07-05 MED ORDER — FUROSEMIDE 10 MG/ML IJ SOLN: 20.0000 mg | Freq: Once | INTRAMUSCULAR | Status: DC

## 2022-07-05 NOTE — Progress Notes (Signed)
[address]       ,[place] [address]             [phone]      1 Day Post-Op  Procedure(s) (LRB): CORONARY ARTERY BYPASS GRAFTING (CABG) X4, USING LEFT GREATER SAPHENOUS VEIN. (N/A) TRANSESOPHAGEAL ECHOCARDIOGRAM (TEE) (N/A)   Total Length of Stay:  LOS: 11 days    SUBJECTIVE: Feels well No SOB Pain under good control  Vitals:   07/05/22 0600 07/05/22 0700  BP:    Pulse: 88 85  Resp: 15 13  Temp: 97.9 F (36.6 C) 97.7 F (36.5 C)  SpO2: 98% 97%    Intake/Output      09/15 0701 09/16 0700 09/16 0701 09/17 0700   I.V. (mL/kg) 3067.7 (39.9)    Blood 1002    IV Piggyback 1550.9    Total Intake(mL/kg) 5620.6 (73.1)    Urine (mL/kg/hr) 3825 (2.1)    Blood 800    Chest Tube 480    Total Output 5105    Net +515.6             sodium chloride 20 mL/hr at 07/05/22 0700   sodium chloride     sodium chloride     albumin human      ceFAZolin (ANCEF) IV Stopped (07/05/22 0539)   dexmedetomidine (PRECEDEX) IV infusion 11.299 mcg/kg/hr (07/04/22 2000)   lactated ringers     lactated ringers     lactated ringers 20 mL/hr at 07/05/22 0700   magnesium sulfate     milrinone 0.125 mcg/kg/min (07/05/22 0700)   nitroGLYCERIN     phenylephrine (NEO-SYNEPHRINE) Adult infusion 60 mcg/min (07/05/22 0700)    CBC    Component Value Date/Time   WBC 14.6 (H) 07/05/2022 0400   RBC 2.97 (L) 07/05/2022 0400   HGB 7.7 (L) 07/05/2022 0400   HCT 24.3 (L) 07/05/2022 0400   PLT 359 07/05/2022 0400   MCV 81.8 07/05/2022 0400   MCH 25.9 (L) 07/05/2022 0400   MCHC 31.7 07/05/2022 0400   RDW 18.8 (H) 07/05/2022 0400   LYMPHSABS 0.9 07/04/2022 2223   MONOABS 1.4 (H) 07/04/2022 2223   EOSABS 0.0 07/04/2022 2223   BASOSABS 0.0 07/04/2022 2223   CMP     Component Value Date/Time   NA 133 (L) 07/05/2022 0400   K 4.8 07/05/2022 0400   CL 103 07/05/2022 0400   CO2 24 07/05/2022 0400   GLUCOSE 129 (H) 07/05/2022 0400   BUN 7 07/05/2022 0400    CREATININE 0.76 07/05/2022 0400   CALCIUM 8.7 (L) 07/05/2022 0400   PROT 7.1 07/01/2022 0425   ALBUMIN 2.3 (L) 07/01/2022 0425   AST 335 (H) 07/01/2022 0425   ALT 231 (H) 07/01/2022 0425   ALKPHOS 123 07/01/2022 0425   BILITOT 0.7 07/01/2022 0425   GFRNONAA >60 07/05/2022 0400   GFRAA >60 04/07/2020 1052   ABG    Component Value Date/Time   PHART 7.374 07/04/2022 1931   PCO2ART 44.2 07/04/2022 1931   PO2ART 132 (H) 07/04/2022 1931   HCO3 25.9 07/04/2022 1931   TCO2 27 07/04/2022 1931   O2SAT 54.3 07/05/2022 0341   CBG (last 3)  Recent Labs    07/04/22 1929 07/05/22 0019 07/05/22 0339  GLUCAP 94 154* 122*     ASSESSMENT: POD #1 CABG Milrinone added with improvement in CO/CI Filling pressures also improved  Will wean Neo for MBP > 60 Lasix 20 mg IV this am Pull CT later this  morning if after getting up to chair no increase. CXR looks great   Coralie Common, MD '@DATE'$ @

## 2022-07-05 NOTE — Progress Notes (Signed)
     [address]       [place] [address]             [phone]       EVENING ROUNDS  Pt had stable day Still on neo and milrinone CT with minimal drainage Will remove CT  Wean neo for MBP of over 68mHg

## 2022-07-05 NOTE — Progress Notes (Signed)
Patient ID: [name], male   DOB: 01-25-1966, 56 y.o.   MRN: [id number]   Advanced Heart Failure Rounding Note  PCP-Cardiologist: None   Subjective:    09/06: Started milrinone 0.125 to facilitate diuresis in setting of low-output 09/07: Milrinone increased to 0.25. 09/09: Milrinone decreased to 0.125  09/11: 1uPRBc  9/12: EGD/ [facility]>>Non-obstructing Schatzki ring, hiatal hernia, normal stomach, 2 polyps (resected, path pending), diverticulosis, non bleeding hemorrhoids. VCE pending . 9/13: VCE- no active bleeding, no AVMs, ulcers, mass. 9/14: cMRI EF 29% with viable myocardium. Mild to moderate MR  9/15: s/p CABG x4 (LIMA-LAD RSVG from aorta to RPDA (acute marginal extension), RSVG from aorta to Ramus sequenced to the distal OM. Perioperative TEE showed only mild-mod MR. No indication for MVR.   On milrinone 0.125 and neo 40. Chest sore but other wise feels good  PAP: (20-39)/(9-23) 37/19 CO:  [2.9 L/min-6.6 L/min] 6.6 L/min CI:  [1.5 L/min/m2-3.5 L/min/m2] 3.5 L/min/m2    Objective:   Weight Range: 76.9 kg Body mass index is 22.99 kg/m.   Vital Signs:   Temp:  [96.4 F (35.8 C)-99.1 F (37.3 C)] 98.4 F (36.9 C) (09/16 0736) Pulse Rate:  [74-104] 87 (09/16 0956) Resp:  [12-28] 13 (09/16 0800) BP: (106-111)/(56) 111/56 (09/16 0956) SpO2:  [96 %-100 %] 99 % (09/16 0800) Arterial Line BP: (94-138)/(50-79) 109/53 (09/16 0800) FiO2 (%):  [40 %-50 %] 40 % (09/15 1901) Weight:  [76.9 kg] 76.9 kg (09/16 0500) Last BM Date : 07/01/22  Weight change: Filed Weights   07/02/22 0400 07/04/22 0458 07/05/22 0500  Weight: 71.4 kg 70.8 kg 76.9 kg    Intake/Output:   Intake/Output Summary (Last 24 hours) at 07/05/2022 1002 Last data filed at 07/05/2022 0800 Gross per 24 hour  Intake 5260.57 ml  Output 4890 ml  Net 370.57 ml      Physical Exam   General:  Sitting up in bed No resp difficulty HEENT: normal Neck: RIJ swan  Cor: Sternal dressing in place. + CTs  Regular rate & rhythm. No rubs, gallops or murmurs. Lungs: clear Abdomen: soft, nontender, nondistended. No hepatosplenomegaly. No bruits or masses. Hypoactive bowel sounds. Extremities: no cyanosis, clubbing, rash, edema Neuro: alert & orientedx3, cranial nerves grossly intact. moves all 4 extremities w/o difficulty. Affect pleasant    Telemetry   NSR 80-90s Personally reviewed   Labs    CBC Recent Labs    07/04/22 2223 07/05/22 0400  WBC 14.1* 14.6*  NEUTROABS 11.7*  --   HGB 7.9* 7.7*  HCT 25.5* 24.3*  MCV 82.3 81.8  PLT 348 157    Basic Metabolic Panel Recent Labs    07/04/22 1930 07/04/22 1931 07/05/22 0400  NA 136 137 133*  K 5.0 5.0 4.8  CL 107  --  103  CO2 25  --  24  GLUCOSE 92  --  129*  BUN 7  --  7  CREATININE 0.63  --  0.76  CALCIUM 8.7*  --  8.7*  MG 2.1  --  2.0    Liver Function Tests No results for input(s): "AST", "ALT", "ALKPHOS", "BILITOT", "PROT", "ALBUMIN" in the last 72 hours.  No results for input(s): "LIPASE", "AMYLASE" in the last 72 hours. Cardiac Enzymes No results for input(s): "CKTOTAL", "CKMB", "CKMBINDEX", "TROPONINI" in the last 72 hours.  BNP: BNP (last 3 results) Recent Labs    06/24/22 0809 06/25/22 0133  BNP 639.0* 830.5*     ProBNP (last 3 results) No results for  input(s): "PROBNP" in the last 8760 hours.   D-Dimer No results for input(s): "DDIMER" in the last 72 hours. Hemoglobin A1C Recent Labs    07/03/22 1935  HGBA1C 7.9*     Fasting Lipid Panel No results for input(s): "CHOL", "HDL", "LDLCALC", "TRIG", "CHOLHDL", "LDLDIRECT" in the last 72 hours.  Thyroid Function Tests No results for input(s): "TSH", "T4TOTAL", "T3FREE", "THYROIDAB" in the last 72 hours.  Invalid input(s): "FREET3"  Other results:   Imaging    DG Chest Port 1 View  Result Date: 07/05/2022 CLINICAL DATA:  Status post CABG. EXAM: PORTABLE CHEST 1 VIEW COMPARISON:  Chest radiograph dated July 04, 2022 FINDINGS:  Heart is mildly enlarged evidence of recent coronary artery bypass grafting. Low lung volumes. No focal consolidation or large pleural effusion. Left IJ access swans Ganz catheter and left-sided chest tube and surgical drains are unchanged. Interval removal of the endotracheal tube and feeding tube. IMPRESSION: Low lung volumes without evidence of focal consolidation or large pleural effusion. Postsurgical changes. Interval removal of the endotracheal and feeding tubes. Remaining lines and tubes unchanged. Electronically Signed   By: Keane Police D.O.   On: 07/05/2022 09:13   DG Chest Port 1 View  Result Date: 07/04/2022 CLINICAL DATA:  Postop CABG EXAM: PORTABLE CHEST 1 VIEW COMPARISON:  Chest 06/29/2022 FINDINGS: Postop CABG. Endotracheal tube in good position. Swan-Ganz catheter right pulmonary artery. NG tube in the stomach. Left chest tube in good position.  No pneumothorax. Lungs are well aerated and clear. No infiltrate or collapse. No effusion. IMPRESSION: Postop CABG.  Lungs are well aerated.  No acute abnormality. Electronically Signed   By: Franchot Gallo M.D.   On: 07/04/2022 14:03     Medications:     Scheduled Medications:  sodium chloride   Intravenous Once   acetaminophen  1,000 mg Oral Q6H   Or   acetaminophen (TYLENOL) oral liquid 160 mg/5 mL  1,000 mg Per Tube Q6H   amiodarone  400 mg Oral BID   aspirin EC  81 mg Oral Daily   bisacodyl  10 mg Oral Daily   Or   bisacodyl  10 mg Rectal Daily   Chlorhexidine Gluconate Cloth  6 each Topical Q0600   clopidogrel  75 mg Oral Daily   docusate sodium  200 mg Oral Daily   insulin aspart  0-24 Units Subcutaneous Q4H   metoprolol tartrate  12.5 mg Oral BID   Or   metoprolol tartrate  12.5 mg Per Tube BID   [START ON 07/06/2022] pantoprazole  40 mg Oral Daily   rosuvastatin  40 mg Oral Daily   sodium chloride flush  3 mL Intravenous Q12H    Infusions:  sodium chloride 20 mL/hr at 07/05/22 0700   sodium chloride     sodium  chloride     albumin human      ceFAZolin (ANCEF) IV Stopped (07/05/22 0539)   dexmedetomidine (PRECEDEX) IV infusion 11.299 mcg/kg/hr (07/04/22 2000)   lactated ringers     lactated ringers     lactated ringers 20 mL/hr at 07/05/22 0700   magnesium sulfate     milrinone 0.125 mcg/kg/min (07/05/22 0700)   nitroGLYCERIN     phenylephrine (NEO-SYNEPHRINE) Adult infusion 40 mcg/min (07/05/22 0852)    PRN Medications: sodium chloride, albumin human, dextrose, lactated ringers, metoprolol tartrate, midazolam, morphine injection, ondansetron (ZOFRAN) IV, oxyCODONE, sodium chloride flush, traMADol    Patient Profile   56 y/o AAM w/ h/o multivessel CAD s/p remote MI in  2012 and multiple PCIs to LAD, diag, ramus and OM vessels, HTN, HLD, Type 2 DM and tobacco use, admitted for NSTEMI and acute CHF. 2D Echo and TEE w/ severe mitral regurgitation. LVEF 40-45%, RV ok. Cath w/ multivessel CAD including high grade LM disease, elevated filling pressures and preserved CO. S/p CABG x 4 on 9/15.    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 No indication for MVR   2.  Acute on chronic systolic CHF/Ischemic cardiomyopathy - Echo with EF 40-45%.  - LHC w/ MVCAD  - RHC w/ RA 8, PA 48/23 (38), PCWP mean 26, LVEDP 21,  PA sat 49% - Optimized w/ milrinone prior to CABG - S/p CABG x 4 on 07/04/22 - On milrinone 0.125 and neo 40. Co-ox marginal at 54% but Swan numbers improved throughout the am.  - Diurese gently with lasix 20 IV x 1. Follow co-ox closely. Can use low-dose NE as needed for RV inotropy   3. NSTEMI w/ Multivessel CAD  - Known hx prior CAD with MI in 2012 and prior multivessel PCI - HS trop peak 1,477 - LHC w/ severe MVCAD including high grade LM disease  - cMRI w/ viable myocardium   - s/p CABG x 4  - ASA + high intensity statin - stable    4. Type 2DM  - per primary team  - A1c 8.2%  - Eventual SGLT2i   5. IDA + EBLA  - IDA at baseline, required transfusion on admit - no obvious source blood loss, FOBT negative.  - Iron  21, sats 4%, treated w/ IV feraheme - GI consulted. EGD/ [facility]>>Non-obstructing Schatzki ring, hiatal hernia, normal stomach, 2 polyps (resected, path pending), diverticulosis, non bleeding hemorrhoids - VCE: no active bleeding, no AVMs, ulcers, mass. - Rec'd 2u RBCs yesterday.  Hgb 6.2 -> 7.7 - follow CBC closely   6. ETOH and tobacco abuse - cessation advised   CRITICAL CARE Performed by: Glori Bickers  Total critical care time: 35 minutes  Critical care time was exclusive of separately billable procedures and treating other patients.  Critical care was necessary to treat or prevent imminent or life-threatening deterioration.  Critical care was time spent personally by me (independent of midlevel providers or residents) on the following activities: development of treatment plan with patient and/or surrogate as well as nursing, discussions with consultants, evaluation of patient's response to treatment, examination of patient, obtaining history from patient or surrogate, ordering and performing treatments and interventions, ordering and review of laboratory studies, ordering and review of radiographic studies, pulse oximetry and re-evaluation of patient's condition.   Length of Stay: [place], MD 07/05/2022, 10:02 AM  Advanced Heart Failure Team Pager [phone] (M-F; 7a - 5p)  Please contact [facility] for night-coverage after hours (5p -7a ) and weekends on [email or web address]

## 2022-07-05 NOTE — Progress Notes (Signed)
Patient stood at bedside with 30cc of chest tube output.

## 2022-07-06 ENCOUNTER — Inpatient Hospital Stay (HOSPITAL_COMMUNITY): Payer: No Typology Code available for payment source

## 2022-07-06 LAB — GLUCOSE, CAPILLARY
Glucose-Capillary: 173 mg/dL — ABNORMAL HIGH (ref 70–99)
Glucose-Capillary: 148 mg/dL — ABNORMAL HIGH (ref 70–99)
Glucose-Capillary: 134 mg/dL — ABNORMAL HIGH (ref 70–99)

## 2022-07-06 LAB — CBC WITH DIFFERENTIAL/PLATELET
Abs Immature Granulocytes: 0.14 10*3/uL — ABNORMAL HIGH (ref 0.00–0.07)
Basophils Absolute: 0 10*3/uL (ref 0.0–0.1)
Basophils Relative: 0 %
Eosinophils Absolute: 0 10*3/uL (ref 0.0–0.5)
Eosinophils Relative: 0 %
HCT: 22.3 % — ABNORMAL LOW (ref 39.0–52.0)
Hemoglobin: 7 g/dL — ABNORMAL LOW (ref 13.0–17.0)
Immature Granulocytes: 1 %
Lymphocytes Relative: 7 %
Lymphs Abs: 1.3 10*3/uL (ref 0.7–4.0)
MCH: 25.7 pg — ABNORMAL LOW (ref 26.0–34.0)
MCHC: 31.4 g/dL (ref 30.0–36.0)
MCV: 82 fL (ref 80.0–100.0)
Monocytes Absolute: 2 10*3/uL — ABNORMAL HIGH (ref 0.1–1.0)
Monocytes Relative: 11 %
Neutro Abs: 14.7 10*3/uL — ABNORMAL HIGH (ref 1.7–7.7)
Neutrophils Relative %: 81 %
Platelets: 317 10*3/uL (ref 150–400)
RBC: 2.72 MIL/uL — ABNORMAL LOW (ref 4.22–5.81)
RDW: 19.2 % — ABNORMAL HIGH (ref 11.5–15.5)
WBC: 18.2 10*3/uL — ABNORMAL HIGH (ref 4.0–10.5)
nRBC: 0 % (ref 0.0–0.2)

## 2022-07-06 LAB — COOXEMETRY PANEL
Carboxyhemoglobin: 1.3 % (ref 0.5–1.5)
Methemoglobin: <0.7 % (ref 0.0–1.5)
O2 Saturation: 54 %
Total hemoglobin: 7.3 g/dL — ABNORMAL LOW (ref 12.0–16.0)

## 2022-07-06 LAB — COMPREHENSIVE METABOLIC PANEL
ALT: 28 U/L (ref 0–44)
AST: 28 U/L (ref 15–41)
Albumin: 2.7 g/dL — ABNORMAL LOW (ref 3.5–5.0)
Alkaline Phosphatase: 54 U/L (ref 38–126)
Anion gap: 9 (ref 5–15)
BUN: 12 mg/dL (ref 6–20)
CO2: 23 mmol/L (ref 22–32)
Calcium: 8.6 mg/dL — ABNORMAL LOW (ref 8.9–10.3)
Chloride: 97 mmol/L — ABNORMAL LOW (ref 98–111)
Creatinine, Ser: 0.87 mg/dL (ref 0.61–1.24)
GFR, Estimated: >60 mL/min (ref >60)
Glucose, Bld: 179 mg/dL — ABNORMAL HIGH (ref 70–99)
Potassium: 4.7 mmol/L (ref 3.5–5.1)
Sodium: 129 mmol/L — ABNORMAL LOW (ref 135–145)
Total Bilirubin: 0.7 mg/dL (ref 0.3–1.2)
Total Protein: 6.1 g/dL — ABNORMAL LOW (ref 6.5–8.1)

## 2022-07-06 LAB — HEMOGLOBIN AND HEMATOCRIT, BLOOD
HCT: 22 % — ABNORMAL LOW (ref 39.0–52.0)
Hemoglobin: 7 g/dL — ABNORMAL LOW (ref 13.0–17.0)

## 2022-07-06 MED ORDER — FUROSEMIDE 10 MG/ML IJ SOLN
40.0000 mg | Freq: Once | INTRAMUSCULAR | Status: AC
  Administered 2022-07-06: 40 mg via INTRAVENOUS
  Filled 2022-07-06: qty 4

## 2022-07-06 MED ORDER — SODIUM CHLORIDE 0.9 % IV SOLN
510.0000 mg | Freq: Once | INTRAVENOUS | Status: AC
  Administered 2022-07-06: 510 mg via INTRAVENOUS
  Filled 2022-07-06: qty 17

## 2022-07-06 MED ORDER — MAGNESIUM SULFATE 4 GM/100ML IV SOLN
4.0000 g | Freq: Once | INTRAVENOUS | Status: AC
  Administered 2022-07-06: 4 g via INTRAVENOUS
  Filled 2022-07-06: qty 100

## 2022-07-06 MED ORDER — CARVEDILOL 3.125 MG PO TABS
3.1250 mg | ORAL_TABLET | Freq: Two times a day (BID) | ORAL | Status: DC
  Administered 2022-07-06 (×2): 3.125 mg via ORAL
  Filled 2022-07-06 (×2): qty 1

## 2022-07-06 MED ORDER — SODIUM CHLORIDE 0.9 % IV SOLN: 250.0000 mg | Freq: Once | INTRAVENOUS | Status: DC

## 2022-07-06 NOTE — Progress Notes (Signed)
Patient ID: [name], male   DOB: 15-Jan-1966, 56 y.o.   MRN: [id number]   Advanced Heart Failure Rounding Note  PCP-Cardiologist: None   Subjective:    09/06: Started milrinone 0.125 to facilitate diuresis in setting of low-output 09/07: Milrinone increased to 0.25. 09/09: Milrinone decreased to 0.125  09/11: 1uPRBc  9/12: EGD/ [facility]>>Non-obstructing Schatzki ring, hiatal hernia, normal stomach, 2 polyps (resected, path pending), diverticulosis, non bleeding hemorrhoids. VCE pending . 9/13: VCE- no active bleeding, no AVMs, ulcers, mass. 9/14: cMRI EF 29% with viable myocardium. Mild to moderate MR  9/15: s/p CABG x4 (LIMA-LAD RSVG from aorta to RPDA (acute marginal extension), RSVG from aorta to Ramus sequenced to the distal OM. Perioperative TEE showed only mild-mod MR. No indication for MVR.   POD #2. Feels ok. Off neo. On milrinone 0.125. Co-ox marginal 54%  Tolerating po. Passing gas. Rhythm stable. Hgb 7.0 Weight still up 15 pounds.   PAP: (29-49)/(9-26) 34/13 CO:  [4.3 L/min-5.6 L/min] 5.4 L/min CI:  [1.9 L/min/m2-2.9 L/min/m2] 2.8 L/min/m2    Objective:   Weight Range: 78 kg Body mass index is 23.32 kg/m.   Vital Signs:   Temp:  [97.9 F (36.6 C)-98.9 F (37.2 C)] 98.9 F (37.2 C) (09/17 0751) Pulse Rate:  [80-117] 91 (09/17 0840) Resp:  [12-25] 17 (09/17 0700) BP: (111-128)/(54-56) 128/54 (09/17 0840) SpO2:  [85 %-99 %] 96 % (09/17 0700) Arterial Line BP: (102-164)/(43-70) 119/52 (09/17 0700) Weight:  [78 kg] 78 kg (09/17 0500) Last BM Date : 07/01/22  Weight change: Filed Weights   07/04/22 0458 07/05/22 0500 07/06/22 0500  Weight: 70.8 kg 76.9 kg 78 kg    Intake/Output:   Intake/Output Summary (Last 24 hours) at 07/06/2022 0931 Last data filed at 07/06/2022 0600 Gross per 24 hour  Intake 2433.8 ml  Output 1315 ml  Net 1118.8 ml      Physical Exam   General:  Sitting up in bed No resp difficulty HEENT: normal Neck: supple. + swan  Carotids 2+ bilat; no bruits. No lymphadenopathy or thryomegaly appreciated. Cor:  Sternal dressing ok RRR Lungs:decreased in bases Abdomen: soft, nontender, nondistended. No hepatosplenomegaly. No bruits or masses.Hypoactivebowel sounds. Extremities: no cyanosis, clubbing, rash, tr edema Neuro: alert & orientedx3, cranial nerves grossly intact. moves all 4 extremities w/o difficulty. Affect pleasant   Telemetry   NSR 80-90s Personally reviewed   Labs    CBC Recent Labs    07/04/22 2223 07/05/22 0400 07/05/22 1627 07/06/22 0402 07/06/22 0829  WBC 14.1*   < > 18.1* 18.2*  --   NEUTROABS 11.7*  --   --  14.7*  --   HGB 7.9*   < > 7.6* 7.0* 7.0*  HCT 25.5*   < > 24.2* 22.3* 22.0*  MCV 82.3   < > 81.2 82.0  --   PLT 348   < > 356 317  --    < > = values in this interval not displayed.    Basic Metabolic Panel Recent Labs    07/05/22 0400 07/05/22 1627 07/06/22 0402  NA 133* 130* 129*  K 4.8 4.5 4.7  CL 103 99 97*  CO2 24 21* 23  GLUCOSE 129* 178* 179*  BUN '7 9 12  '$ CREATININE 0.76 0.87 0.87  CALCIUM 8.7* 8.9 8.6*  MG 2.0 1.7  --     Liver Function Tests Recent Labs    07/06/22 0402  AST 28  ALT 28  ALKPHOS 54  BILITOT 0.7  PROT  6.1*  ALBUMIN 2.7*    No results for input(s): "LIPASE", "AMYLASE" in the last 72 hours. Cardiac Enzymes No results for input(s): "CKTOTAL", "CKMB", "CKMBINDEX", "TROPONINI" in the last 72 hours.  BNP: BNP (last 3 results) Recent Labs    06/24/22 0809 06/25/22 0133  BNP 639.0* 830.5*     ProBNP (last 3 results) No results for input(s): "PROBNP" in the last 8760 hours.   D-Dimer No results for input(s): "DDIMER" in the last 72 hours. Hemoglobin A1C Recent Labs    07/03/22 1935  HGBA1C 7.9*     Fasting Lipid Panel No results for input(s): "CHOL", "HDL", "LDLCALC", "TRIG", "CHOLHDL", "LDLDIRECT" in the last 72 hours.  Thyroid Function Tests No results for input(s): "TSH", "T4TOTAL", "T3FREE", "THYROIDAB" in  the last 72 hours.  Invalid input(s): "FREET3"  Other results:   Imaging    DG CHEST PORT 1 VIEW  Result Date: 07/06/2022 CLINICAL DATA:  Status post coronary bypass surgery EXAM: PORTABLE CHEST 1 VIEW COMPARISON:  Previous studies including the examination of 07/05/2022 FINDINGS: Transverse diameter of heart is increased. There is coronary bypass surgery. Tip of left IJ Swan-Ganz catheter is seen in the course of the main pulmonary artery. There is interval removal of left chest tube and mediastinal drains. There is blunting of both lateral CP angles. Increased markings are seen in both lower lung fields. There is no pneumothorax. Old fractures are seen in left ribs. IMPRESSION: Increased density in the lower lung fields and blunting of both lateral CP angles suggest small bilateral pleural effusions and underlying atelectasis. Electronically Signed   By: Elmer Picker M.D.   On: 07/06/2022 09:20     Medications:     Scheduled Medications:  sodium chloride   Intravenous Once   acetaminophen  1,000 mg Oral Q6H   Or   acetaminophen (TYLENOL) oral liquid 160 mg/5 mL  1,000 mg Per Tube Q6H   amiodarone  400 mg Oral BID   aspirin EC  81 mg Oral Daily   bisacodyl  10 mg Oral Daily   Or   bisacodyl  10 mg Rectal Daily   carvedilol  3.125 mg Oral BID WC   Chlorhexidine Gluconate Cloth  6 each Topical Q0600   clopidogrel  75 mg Oral Daily   docusate sodium  200 mg Oral Daily   insulin aspart  0-24 Units Subcutaneous Q4H   pantoprazole  40 mg Oral Daily   rosuvastatin  40 mg Oral Daily   sodium chloride flush  3 mL Intravenous Q12H    Infusions:  sodium chloride Stopped (07/05/22 1733)   sodium chloride     sodium chloride     albumin human     lactated ringers     lactated ringers     lactated ringers 20 mL/hr at 07/06/22 0600   magnesium sulfate     phenylephrine (NEO-SYNEPHRINE) Adult infusion Stopped (07/06/22 0531)    PRN Medications: sodium chloride, albumin  human, dextrose, lactated ringers, metoprolol tartrate, midazolam, morphine injection, ondansetron (ZOFRAN) IV, oxyCODONE, sodium chloride flush, traMADol    Patient Profile   56 y/o AAM w/ h/o multivessel CAD s/p remote MI in 2012 and multiple PCIs to LAD, diag, ramus and OM vessels, HTN, HLD, Type 2 DM and tobacco use, admitted for NSTEMI and acute CHF. 2D Echo and TEE w/ severe mitral regurgitation. LVEF 40-45%, RV ok. Cath w/ multivessel CAD including high grade LM disease, elevated filling pressures and preserved CO. S/p CABG x 4 on 9/15.  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 No indication for MVR   2.  Acute on chronic systolic CHF/Ischemic cardiomyopathy - Echo with EF 40-45%.  - LHC w/ MVCAD  - RHC w/ RA 8, PA 48/23 (38), PCWP mean 26, LVEDP 21,  PA sat 49% - Optimized w/ milrinone prior to CABG - S/p CABG x 4 on 07/04/22 - On milrinone 0.125. Off neo. Co-ox marginal at 54% but Swan numbers look good, Ok to stop milrinone.  - Needs diuresis. Will give lasix 40 IV bid.  - Start GDMT as tolerated. Carvedilol ordered per TCTS   3. NSTEMI w/ Multivessel CAD  - Known hx prior CAD with MI in 2012 and prior multivessel PCI - HS trop peak 1,477 - LHC w/ severe MVCAD including high grade LM disease  - cMRI w/ viable myocardium  - s/p CABG x 4  - ASA + high intensity statin - stable - Mobilize    4. Type 2DM  - per primary team  - A1c 8.2%  - Eventual SGLT2i   5. IDA + EBLA  - IDA at baseline, required transfusion on admit - no obvious source blood loss, FOBT negative.  - Iron  21, sats 4%, treated w/ IV feraheme - GI consulted. EGD/ [facility]>>Non-obstructing Schatzki ring, hiatal hernia, normal stomach, 2 polyps (resected, path pending), diverticulosis, non  bleeding hemorrhoids - VCE: no active bleeding, no AVMs, ulcers, mass. - Rec'd 2u RBCs 9/15.  Hgb 6.2 -> 7.7 -> 7.0 - Getting IV iron today.Watch hgb. I f < 7.0 will transfuse  6. ETOH and tobacco abuse - cessation advised   CRITICAL CARE Performed by: Glori Bickers  Total critical care time: 35 minutes  Critical care time was exclusive of separately billable procedures and treating other patients.  Critical care was necessary to treat or prevent imminent or life-threatening deterioration.  Critical care was time spent personally by me (independent of midlevel providers or residents) on the following activities: development of treatment plan with patient and/or surrogate as well as nursing, discussions with consultants, evaluation of patient's response to treatment, examination of patient, obtaining history from patient or surrogate, ordering and performing treatments and interventions, ordering and review of laboratory studies, ordering and review of radiographic studies, pulse oximetry and re-evaluation of patient's condition.   Length of Stay: 12  Glori Bickers, MD 07/06/2022, 9:31 AM  Advanced Heart Failure Team Pager [phone] (M-F; 7a - 5p)  Please contact [facility] for night-coverage after hours (5p -7a ) and weekends on [email or web address]

## 2022-07-06 NOTE — Progress Notes (Signed)
     [address]       [place] [address]             [phone]       EVENING ROUNDS Doing well off milrinone DC art line Lasix 40 mg IV tonight Hopefully transfer to floor tomorrow

## 2022-07-06 NOTE — Progress Notes (Signed)
     [address]       [place] [address]             [phone]       Patient Details:    [name] is an 56 y.o. male.   Subjective:    Overnight Issues:  Feels well. Sat in chair most of day yesterday Tolerating po No CP or SOB Was able to wean off neo last night  Objective:  Vital signs for last 24 hours: Temp:  [97.7 F (36.5 C)-98.6 F (37 C)] 98.6 F (37 C) (09/17 0600) Pulse Rate:  [80-117] 85 (09/17 0700) Resp:  [12-25] 17 (09/17 0700) BP: (111)/(56) 111/56 (09/16 0956) SpO2:  [85 %-99 %] 96 % (09/17 0700) Arterial Line BP: (102-164)/(43-70) 119/52 (09/17 0700) Weight:  [78 kg] 78 kg (09/17 0500)  Hemodynamic parameters for last 24 hours: PAP: (30-49)/(9-26) 34/13 CO:  [5.4 L/min-5.6 L/min] 5.4 L/min CI:  [2.8 L/min/m2-2.9 L/min/m2] 2.8 L/min/m2  Intake/Output from previous day: 09/16 0701 - 09/17 0700 In: 2584.5 [P.O.:900; I.V.:1214.4; IV Piggyback:470.1] Out: 3748 [Urine:1345; Chest Tube:30]  Intake/Output this shift: No intake/output data recorded.  Vent settings for last 24 hours:    Physical Exam:  Lungs: decreased in the bases Cardiac: RR with no significant murmur Ext: Warm and dry  Assessment/Plan:    LOS: 12 days   SP CABG POD #2 Doing well with good CI. Will add Carvedilol and wean off milrinone today DC swan CXR Lasix this am and remove foley afterwards Iron replacement   Critical Care Total Time*: 30 Minutes  Coralie Common 07/06/2022  *Care during the described time interval was provided by me and/or other providers on the critical care team.  I have reviewed this patient's available data, including medical history, events of note, physical examination and test results as part of my evaluation.

## 2022-07-07 ENCOUNTER — Other Ambulatory Visit (HOSPITAL_COMMUNITY): Payer: Self-pay

## 2022-07-07 ENCOUNTER — Telehealth (HOSPITAL_COMMUNITY): Payer: Self-pay | Admitting: Pharmacy Technician

## 2022-07-07 ENCOUNTER — Encounter (HOSPITAL_COMMUNITY): Payer: Self-pay | Admitting: Thoracic Surgery (Cardiothoracic Vascular Surgery)

## 2022-07-07 ENCOUNTER — Inpatient Hospital Stay (HOSPITAL_COMMUNITY): Payer: No Typology Code available for payment source

## 2022-07-07 LAB — CBC WITH DIFFERENTIAL/PLATELET
Abs Immature Granulocytes: 0.28 10*3/uL — ABNORMAL HIGH (ref 0.00–0.07)
Basophils Absolute: 0 10*3/uL (ref 0.0–0.1)
Basophils Relative: 0 %
Eosinophils Absolute: 0 10*3/uL (ref 0.0–0.5)
Eosinophils Relative: 0 %
HCT: 26 % — ABNORMAL LOW (ref 39.0–52.0)
Hemoglobin: 8.1 g/dL — ABNORMAL LOW (ref 13.0–17.0)
Immature Granulocytes: 1 %
Lymphocytes Relative: 6 %
Lymphs Abs: 1.1 10*3/uL (ref 0.7–4.0)
MCH: 25.8 pg — ABNORMAL LOW (ref 26.0–34.0)
MCHC: 31.2 g/dL (ref 30.0–36.0)
MCV: 82.8 fL (ref 80.0–100.0)
Monocytes Absolute: 1.4 10*3/uL — ABNORMAL HIGH (ref 0.1–1.0)
Monocytes Relative: 7 %
Neutro Abs: 17 10*3/uL — ABNORMAL HIGH (ref 1.7–7.7)
Neutrophils Relative %: 86 %
Platelets: 425 10*3/uL — ABNORMAL HIGH (ref 150–400)
RBC: 3.14 MIL/uL — ABNORMAL LOW (ref 4.22–5.81)
RDW: 20.1 % — ABNORMAL HIGH (ref 11.5–15.5)
WBC: 19.8 10*3/uL — ABNORMAL HIGH (ref 4.0–10.5)
nRBC: 0 % (ref 0.0–0.2)

## 2022-07-07 LAB — TYPE AND SCREEN
ABO/RH(D): A POS
Antibody Screen: NEGATIVE
Unit division: 0
Unit division: 0
Unit division: 0

## 2022-07-07 LAB — BPAM RBC
Blood Product Expiration Date: 202310122359
Blood Product Expiration Date: 202310122359
Blood Product Expiration Date: 202310122359
ISSUE DATE / TIME: 202309151502
ISSUE DATE / TIME: 202309151929
Unit Type and Rh: 6200
Unit Type and Rh: 6200
Unit Type and Rh: 6200

## 2022-07-07 LAB — GLUCOSE, CAPILLARY
Glucose-Capillary: 121 mg/dL — ABNORMAL HIGH (ref 70–99)
Glucose-Capillary: 127 mg/dL — ABNORMAL HIGH (ref 70–99)
Glucose-Capillary: 141 mg/dL — ABNORMAL HIGH (ref 70–99)
Glucose-Capillary: 159 mg/dL — ABNORMAL HIGH (ref 70–99)
Glucose-Capillary: 178 mg/dL — ABNORMAL HIGH (ref 70–99)
Glucose-Capillary: 190 mg/dL — ABNORMAL HIGH (ref 70–99)
Glucose-Capillary: 212 mg/dL — ABNORMAL HIGH (ref 70–99)
Glucose-Capillary: 219 mg/dL — ABNORMAL HIGH (ref 70–99)
Glucose-Capillary: 298 mg/dL — ABNORMAL HIGH (ref 70–99)
Glucose-Capillary: 310 mg/dL — ABNORMAL HIGH (ref 70–99)

## 2022-07-07 LAB — COMPREHENSIVE METABOLIC PANEL
ALT: 23 U/L (ref 0–44)
AST: 25 U/L (ref 15–41)
Albumin: 2.9 g/dL — ABNORMAL LOW (ref 3.5–5.0)
Alkaline Phosphatase: 71 U/L (ref 38–126)
Anion gap: 13 (ref 5–15)
BUN: 11 mg/dL (ref 6–20)
CO2: 23 mmol/L (ref 22–32)
Calcium: 9.3 mg/dL (ref 8.9–10.3)
Chloride: 92 mmol/L — ABNORMAL LOW (ref 98–111)
Creatinine, Ser: 0.92 mg/dL (ref 0.61–1.24)
GFR, Estimated: >60 mL/min (ref >60)
Glucose, Bld: 246 mg/dL — ABNORMAL HIGH (ref 70–99)
Potassium: 4.5 mmol/L (ref 3.5–5.1)
Sodium: 128 mmol/L — ABNORMAL LOW (ref 135–145)
Total Bilirubin: 0.8 mg/dL (ref 0.3–1.2)
Total Protein: 7.1 g/dL (ref 6.5–8.1)

## 2022-07-07 LAB — SURGICAL PATHOLOGY

## 2022-07-07 MED ORDER — FUROSEMIDE 40 MG PO TABS
40.0000 mg | ORAL_TABLET | Freq: Every day | ORAL | Status: DC
  Administered 2022-07-07: 40 mg via ORAL
  Filled 2022-07-07: qty 1

## 2022-07-07 MED ORDER — POTASSIUM CHLORIDE CRYS ER 20 MEQ PO TBCR
20.0000 meq | EXTENDED_RELEASE_TABLET | Freq: Every day | ORAL | Status: DC
  Administered 2022-07-07: 20 meq via ORAL
  Filled 2022-07-07: qty 1

## 2022-07-07 MED ORDER — INSULIN ASPART 100 UNIT/ML IJ SOLN
0.0000 [IU] | Freq: Three times a day (TID) | INTRAMUSCULAR | Status: DC
  Administered 2022-07-07: 2 [IU] via SUBCUTANEOUS
  Administered 2022-07-07 – 2022-07-08 (×3): 8 [IU] via SUBCUTANEOUS
  Administered 2022-07-08 – 2022-07-09 (×2): 2 [IU] via SUBCUTANEOUS
  Administered 2022-07-09: 8 [IU] via SUBCUTANEOUS

## 2022-07-07 MED ORDER — CARVEDILOL 6.25 MG PO TABS
6.2500 mg | ORAL_TABLET | Freq: Two times a day (BID) | ORAL | Status: DC
  Administered 2022-07-07 – 2022-07-10 (×7): 6.25 mg via ORAL
  Filled 2022-07-07 (×7): qty 1

## 2022-07-07 MED ORDER — SPIRONOLACTONE 12.5 MG HALF TABLET
12.5000 mg | ORAL_TABLET | Freq: Every day | ORAL | Status: DC
  Administered 2022-07-07 – 2022-07-10 (×4): 12.5 mg via ORAL
  Filled 2022-07-07 (×4): qty 1

## 2022-07-07 MED ORDER — AMIODARONE HCL 200 MG PO TABS
400.0000 mg | ORAL_TABLET | Freq: Every day | ORAL | Status: DC
  Administered 2022-07-08 – 2022-07-10 (×3): 400 mg via ORAL
  Filled 2022-07-07 (×3): qty 2

## 2022-07-07 MED ORDER — AMIODARONE HCL 200 MG PO TABS
400.0000 mg | ORAL_TABLET | Freq: Two times a day (BID) | ORAL | Status: AC
  Administered 2022-07-07 (×2): 400 mg via ORAL
  Filled 2022-07-07 (×2): qty 2

## 2022-07-07 MED ORDER — FERROUS SULFATE 325 (65 FE) MG PO TABS
325.0000 mg | ORAL_TABLET | Freq: Three times a day (TID) | ORAL | Status: DC
  Administered 2022-07-07 – 2022-07-10 (×10): 325 mg via ORAL
  Filled 2022-07-07 (×10): qty 1

## 2022-07-07 NOTE — TOC Benefit Eligibility Note (Signed)
Patient Advocate Encounter   No insurance found.   The patient is currently admitted and upon discharge could be taking [place], [place] or [place].   Unable to test claim.   The patient is insured through [place].  Verified that Delene Loll and Vania Rea are formulary for the [place], but Wilder Glade is non-formulary.

## 2022-07-07 NOTE — Progress Notes (Signed)
Patient ID: [name], male   DOB: Dec 18, 1965, 56 y.o.   MRN: [id number] TCTS Evening Rounds:  Hemodynamically stable in sinus rhythm.  Waiting on 4E bed

## 2022-07-07 NOTE — Telephone Encounter (Signed)
Pharmacy Patient Advocate Encounter  No insurance found. The patient is insured through [place].   The patient is currently admitted and upon discharge could be taking [place], [place] or [place].   Unable to test claim. Results were relayed to Inpatient clinical team.  Verified that Delene Loll and Vania Rea are formulary for the [facility], but Wilder Glade is non-formulary.

## 2022-07-07 NOTE — Progress Notes (Addendum)
Patient ID: [name], male   DOB: [birth year], 56 y.o.   MRN: [id number]   Advanced Heart Failure Rounding Note  PCP-Cardiologist: None   Subjective:    09/06: Started milrinone 0.125 to facilitate diuresis in setting of low-output 09/07: Milrinone increased to 0.25. 09/09: Milrinone decreased to 0.125  09/11: 1uPRBc  9/12: EGD/ [facility]o>>Non-obstructing Schatzki ring, hiatal hernia, normal stomach, 2 polyps (resected, path pending), diverticulosis, non bleeding hemorrhoids. VCE pending . 9/13: VCE- no active bleeding, no AVMs, ulcers, mass. 9/14: cMRI EF 29% with viable myocardium. Mild to moderate MR  9/15: s/p CABG x4 (LIMA-LAD RSVG from aorta to RPDA (acute marginal extension), RSVG from aorta to Ramus sequenced to the distal OM. Perioperative TEE showed only mild-mod MR. No indication for MVR.  9/17 - Give Feraheme. Milrinone stopped.   POD #3. Labs pending.   Denies SOB. Walked around the unit.    Objective:   Weight Range: 75.3 kg Body mass index is 22.51 kg/m.   Vital Signs:   Temp:  [98.4 F (36.9 C)-99.5 F (37.5 C)] 99.5 F (37.5 C) (09/18 0800) Pulse Rate:  [79-95] 90 (09/18 0800) Resp:  [17-28] 22 (09/18 0800) BP: (95-128)/(54-86) 114/73 (09/18 0800) SpO2:  [67 %-100 %] 97 % (09/18 0800) Arterial Line BP: (106-163)/(41-73) 135/55 (09/17 1700) Weight:  [75.3 kg] 75.3 kg (09/18 0200) Last BM Date : 07/06/22  Weight change: Filed Weights   07/05/22 0500 07/06/22 0500 07/07/22 0200  Weight: 76.9 kg 78 kg 75.3 kg    Intake/Output:   Intake/Output Summary (Last 24 hours) at 07/07/2022 0823 Last data filed at 07/07/2022 0800 Gross per 24 hour  Intake 1072.81 ml  Output 2360 ml  Net -1287.19 ml     Physical Exam   General:  Sitting in the chair. No resp difficulty HEENT: normal Neck: supple. no JVD. Carotids 2+ bilat; no bruits. No lymphadenopathy or thryomegaly appreciated.LIJ Cor: PMI nondisplaced. Regular rate & rhythm. No rubs, gallops or  murmurs. Lungs: clear Abdomen: soft, nontender, nondistended. No hepatosplenomegaly. No bruits or masses. Good bowel sounds. Extremities: no cyanosis, clubbing, rash, edema Neuro: alert & orientedx3, cranial nerves grossly intact. moves all 4 extremities w/o difficulty. Affect pleasant  Telemetry  SR 70-90s  Labs    CBC Recent Labs    07/04/22 2223 07/05/22 0400 07/05/22 1627 07/06/22 0402 07/06/22 0829  WBC 14.1*   < > 18.1* 18.2*  --   NEUTROABS 11.7*  --   --  14.7*  --   HGB 7.9*   < > 7.6* 7.0* 7.0*  HCT 25.5*   < > 24.2* 22.3* 22.0*  MCV 82.3   < > 81.2 82.0  --   PLT 348   < > 356 317  --    < > = values in this interval not displayed.   Basic Metabolic Panel Recent Labs    07/05/22 0400 07/05/22 1627 07/06/22 0402  NA 133* 130* 129*  K 4.8 4.5 4.7  CL 103 99 97*  CO2 24 21* 23  GLUCOSE 129* 178* 179*  BUN '7 9 12  '$ CREATININE 0.76 0.87 0.87  CALCIUM 8.7* 8.9 8.6*  MG 2.0 1.7  --    Liver Function Tests Recent Labs    07/06/22 0402  AST 28  ALT 28  ALKPHOS 54  BILITOT 0.7  PROT 6.1*  ALBUMIN 2.7*    No results for input(s): "LIPASE", "AMYLASE" in the last 72 hours. Cardiac Enzymes No results for input(s): "CKTOTAL", "CKMB", "CKMBINDEX", "TROPONINI"  in the last 72 hours.  BNP: BNP (last 3 results) Recent Labs    06/24/22 0809 06/25/22 0133  BNP 639.0* 830.5*    ProBNP (last 3 results) No results for input(s): "PROBNP" in the last 8760 hours.   D-Dimer No results for input(s): "DDIMER" in the last 72 hours. Hemoglobin A1C No results for input(s): "HGBA1C" in the last 72 hours.  Fasting Lipid Panel No results for input(s): "CHOL", "HDL", "LDLCALC", "TRIG", "CHOLHDL", "LDLDIRECT" in the last 72 hours.  Thyroid Function Tests No results for input(s): "TSH", "T4TOTAL", "T3FREE", "THYROIDAB" in the last 72 hours.  Invalid input(s): "FREET3"  Other results:   Imaging    No results found.   Medications:     Scheduled  Medications:  sodium chloride   Intravenous Once   acetaminophen  1,000 mg Oral Q6H   Or   acetaminophen (TYLENOL) oral liquid 160 mg/5 mL  1,000 mg Per Tube Q6H   amiodarone  400 mg Oral BID   [START ON 07/08/2022] amiodarone  400 mg Oral Daily   aspirin EC  81 mg Oral Daily   bisacodyl  10 mg Oral Daily   Or   bisacodyl  10 mg Rectal Daily   carvedilol  6.25 mg Oral BID WC   Chlorhexidine Gluconate Cloth  6 each Topical Q0600   clopidogrel  75 mg Oral Daily   docusate sodium  200 mg Oral Daily   ferrous sulfate  325 mg Oral TID WC   furosemide  40 mg Oral Daily   insulin aspart  0-24 Units Subcutaneous TID WC   pantoprazole  40 mg Oral Daily   potassium chloride  20 mEq Oral Daily   rosuvastatin  40 mg Oral Daily   sodium chloride flush  3 mL Intravenous Q12H    Infusions:  lactated ringers      PRN Medications: metoprolol tartrate, ondansetron (ZOFRAN) IV, oxyCODONE, sodium chloride flush, traMADol    Patient Profile   56 y/o AAM w/ h/o multivessel CAD s/p remote MI in 2012 and multiple PCIs to LAD, diag, ramus and OM vessels, HTN, HLD, Type 2 DM and tobacco use, admitted for NSTEMI and acute CHF. 2D Echo and TEE w/ severe mitral regurgitation. LVEF 40-45%, RV ok. Cath w/ multivessel CAD including high grade LM disease, elevated filling pressures and preserved CO. S/p CABG x 4 on 9/15.    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 No indication for MVR   2.  Acute on chronic systolic CHF/Ischemic cardiomyopathy - Echo with EF 40-45%.  - LHC w/ MVCAD  - RHC w/ RA 8, PA 48/23 (38), PCWP mean 26, LVEDP 21,  PA sat 49% - Optimized w/ milrinone prior to CABG - S/p CABG x 4 on 07/04/22 - Start GDMT as tolerated.  -Carvedilol ordered per TCTS - Labs pending.   3.  NSTEMI w/ Multivessel CAD  - Known hx prior CAD with MI in 2012 and prior multivessel PCI - HS trop peak 1,477 - LHC w/ severe MVCAD including high grade LM disease  - cMRI w/ viable myocardium  - s/p CABG x 4  - ASA + high intensity statin   4. Type 2DM  - per primary team  - A1c 8.2%  - Eventual SGLT2i  5. IDA +  EBLA  - IDA at baseline, required transfusion on admit - no obvious source blood loss, FOBT negative.  - Iron  21, sats 4%, treated w/ IV feraheme - GI consulted. EGD/ Colo>>Non-obstructing Schatzki ring, hiatal hernia, normal stomach, 2 polyps (resected, path pending), diverticulosis, non bleeding hemorrhoids - VCE: no active bleeding, no AVMs, ulcers, mass. - Rec'd 2u RBCs 9/15.   - Given Feraheme 9/17  - hgb 6.2>7.7>7 . Transfuse hgb < 7   6. ETOH and tobacco abuse - cessation advised   Length of Stay: [place], NP_C  07/07/2022, 8:23 AM  Advanced Heart Failure Team Pager [phone] (M-F; 7a - 5p)  Please contact [facility] for night-coverage after hours (5p -7a ) and weekends on [email or web address]   Patient seen and examined with the above-signed Advanced Practice Provider and/or Housestaff. I personally reviewed laboratory data, imaging studies and relevant notes. I independently examined the patient and formulated the important aspects of the plan. I have edited the note to reflect any of my changes or salient points. I have personally discussed the plan with the patient and/or family.  Off inotropes. Walking the unit. Vitals stable. Labs pending. + BM   General:  Well appearing. No resp difficulty HEENT: normal Neck: supple. no JVD. Carotids 2+ bilat; no bruits. No lymphadenopathy or thryomegaly appreciated. Cor: Chest wound ok  Regular rate & rhythm. No rubs, gallops or murmurs. Lungs: clear Abdomen: soft, nontender, nondistended. No hepatosplenomegaly. No bruits or masses. Good bowel sounds. Extremities: no cyanosis, clubbing, rash, edema Neuro: alert  & orientedx3, cranial nerves grossly intact. moves all 4 extremities w/o difficulty. Affect pleasant  Doing well. Titrate GDMT.Can go to floor. Continue to ambulate.   Glori Bickers, MD  8:55 AM

## 2022-07-07 NOTE — Progress Notes (Signed)
Follow up - Critical Care Medicine Note  Patient Details:    [name] is an 56 y.o. male.   Subjective:    Overnight Issues:  Has no issues overnight. CVP not drawing back. Pt walked 3-4 loops yesterday. Still on 4l [place] but nurse feels pt concerned when they drop it.  Objective:  Vital signs for last 24 hours: Temp:  [98.4 F (36.9 C)-99.3 F (37.4 C)] 98.5 F (36.9 C) (09/18 0300) Pulse Rate:  [79-95] 94 (09/18 0600) Resp:  [17-28] 20 (09/18 0700) BP: (95-128)/(54-86) 122/72 (09/18 0700) SpO2:  [67 %-100 %] 96 % (09/18 0600) Arterial Line BP: (106-163)/(41-73) 135/55 (09/17 1700) Weight:  [75.3 kg] 75.3 kg (09/18 0200)  Hemodynamic parameters for last 24 hours: PAP: (35-47)/(13-23) 47/23  Intake/Output from previous day: 09/17 0701 - 09/18 0700 In: 832.8 [P.O.:250; I.V.:359; IV Piggyback:223.8] Out: 2250 [Urine:2250]  Intake/Output this shift: No intake/output data recorded.  Vent settings for last 24 hours:    Physical Exam:  Lungs: very decreased in bases.  Cardiac: RR Ext: warm and dry  Assessment/Plan:    LOS: 13 days   SP CABG x 4, Ischemic Cardiomyopathy and NSTEMI Doing well CXR with bibasilar consolidation vs effusion: will continue diuresis for now, may need thorocentesis.  Increase Carvedilol to 6.'25mg'$  bid Amio to '400mg'$  a day DC foley and CVP Transfer to floor Ambulate  Critical Care Total Time*: 15 Minutes  Coralie Common 07/07/2022  *Care during the described time interval was provided by me and/or other providers on the critical care team.  I have reviewed this patient's available data, including medical history, events of note, physical examination and test results as part of my evaluation.

## 2022-07-07 NOTE — Plan of Care (Signed)
Jatinder Mcdonagh, RN 

## 2022-07-07 NOTE — TOC Initial Note (Signed)
Transition of Care [facility]) - Initial/Assessment Note    Patient Details  Name: [name] MRN: [id number] Date of Birth: 1966-01-30  Transition of Care [facility]) CM/SW Contact:    Erenest Rasher, RN Phone Number: [phone] 07/07/2022, 4:53 PM  Clinical Narrative:                 HF TOC CM spoke to pt at bedside. Pt reports has PCP at [facility]. Pt sats 97% off oxygen, will continue to monitor. Pt did not request DME. Will request meds come up from [facility] at dc.   Expected Discharge Plan: Home/Self Care Barriers to Discharge: Continued Medical Work up   Patient Goals and CMS Choice Patient states their goals for this hospitalization and ongoing recovery are:: wants to remain independent CMS Medicare.gov Compare Post Acute Care list provided to:: Patient    Expected Discharge Plan and Services Expected Discharge Plan: Home/Self Care   Discharge Planning Services: CM Consult   Living arrangements for the past 2 months: Apartment                                      Prior Living Arrangements/Services Living arrangements for the past 2 months: Apartment Lives with:: Significant Other Patient language and need for interpreter reviewed:: Yes        Need for Family Participation in Patient Care: No (Comment) Care giver support system in place?: Yes (comment)   Criminal Activity/Legal Involvement Pertinent to Current Situation/Hospitalization: No - Comment as needed  Activities of Daily Living Home Assistive Devices/Equipment: Eyeglasses, CBG Meter, CPAP ADL Screening (condition at time of admission) Patient's cognitive ability adequate to safely complete daily activities?: Yes Is the patient deaf or have difficulty hearing?: No Does the patient have difficulty seeing, even when wearing glasses/contacts?: No Does the patient have difficulty concentrating, remembering, or making decisions?: Yes Patient able to express need for assistance with  ADLs?: Yes Does the patient have difficulty dressing or bathing?: No Independently performs ADLs?: Yes (appropriate for developmental age) Does the patient have difficulty walking or climbing stairs?: No Weakness of Legs: None Weakness of Arms/Hands: None  Permission Sought/Granted Permission sought to share information with : Case Manager, Family Supports, PCP Permission granted to share information with : Yes, Verbal Permission Granted  Share Information with NAME: Mylinda Latina     Permission granted to share info w Relationship: friend     Emotional Assessment Appearance:: Appears stated age Attitude/Demeanor/Rapport: Engaged Affect (typically observed): Accepting Orientation: : Oriented to Self, Oriented to Place, Oriented to  Time, Oriented to Situation   Psych Involvement: No (comment)  Admission diagnosis:  Elevated troponin [R77.8] Chest pain [R07.9] Nonspecific chest pain [R07.9] Anemia, unspecified type [D64.9] Acute congestive heart failure, unspecified heart failure type ([place]) [I50.9] S/P CABG x 4 [Z95.1] Patient Active Problem List   Diagnosis Date Noted   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   Hiatal hernia    Adenomatous polyp of ascending colon    Adenomatous polyp of transverse colon    Grade I internal hemorrhoids    Diverticulosis of colon without hemorrhage    Folate deficiency 06/30/2022   Acute congestive heart failure ([place]) 06/30/2022   Elevated troponin 06/30/2022   Left upper lobe pneumonia 06/27/2022   Iron deficiency anemia 15/17/6160   Acute systolic heart failure ([place])    Localized swelling of right lower extremity 06/24/2022  HLD (hyperlipidemia) 06/24/2022   Microcytic anemia 06/24/2022   Hypomagnesemia 06/24/2022   Acute pancreatitis 04/04/2020   Forearm fracture, right, closed, initial encounter 12/31/2016   Hypokalemia 12/31/2016   Closed displaced comminuted fracture of shaft of right radius    Fall at home, initial encounter     Hyponatremia 08/22/2015   GERD (gastroesophageal reflux disease) 08/22/2015   Tobacco abuse 08/22/2015   ETOH abuse 08/22/2015   Chest pain at rest 08/22/2015   Type 2 diabetes mellitus with hyperlipidemia ([place])    Pancreatitis 01/05/2013   CAD (coronary artery disease) 01/05/2013   Hypertension 01/05/2013   Hyperglycemia 01/05/2013   Elevated LFTs 01/05/2013   PCP:  Charolette Forward, MD Pharmacy:   [id number] [place] [id number]: [phone]: [phone]    Social Determinants of Health (SDOH) Interventions    Readmission Risk Interventions     No data to display

## 2022-07-07 NOTE — Discharge Summary (Addendum)
[address]       [place] [address]             [phone]    Physician Discharge Summary  Patient ID: Baxter Gonzalez MRN: [id number] DOB/AGE: 55-Nov-1967 55 y.o.  Admit date: 06/24/2022 Discharge date: 07/10/2022  Admission Diagnoses:  Patient Active Problem List   Diagnosis Date Noted   Hiatal hernia    Adenomatous polyp of ascending colon    Adenomatous polyp of transverse colon    Grade I internal hemorrhoids    Diverticulosis of colon without hemorrhage    Folate deficiency 06/30/2022   Acute congestive heart failure ([place]) 06/30/2022   Elevated troponin 06/30/2022   Left upper lobe pneumonia 06/27/2022   Iron deficiency anemia 89/37/3428   Acute systolic heart failure (H[facility])    Localized swelling of right lower extremity 06/24/2022   HLD (hyperlipidemia) 06/24/2022   Microcytic anemia 06/24/2022   Hypomagnesemia 06/24/2022   Acute pancreatitis 04/04/2020   Forearm fracture, right, closed, initial encounter 12/31/2016   Hypokalemia 12/31/2016   Closed displaced comminuted fracture of shaft of right radius    Fall at home, initial encounter    Hyponatremia 08/22/2015   GERD (gastroesophageal reflux disease) 08/22/2015   Tobacco abuse 08/22/2015   ETOH abuse 08/22/2015   Chest pain at rest 08/22/2015   Type 2 diabetes mellitus with hyperlipidemia ([place])    Pancreatitis 01/05/2013   CAD (coronary artery disease) 01/05/2013   Hypertension 01/05/2013   Hyperglycemia 01/05/2013   Elevated LFTs 01/05/2013   Discharge Diagnoses:  Patient Active Problem List   Diagnosis Date Noted   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   Hiatal hernia    Adenomatous polyp of ascending colon    Adenomatous polyp of transverse colon    Grade I internal hemorrhoids    Diverticulosis of colon without hemorrhage    Folate deficiency 06/30/2022   Acute congestive heart failure ([place]) 06/30/2022   Elevated troponin 06/30/2022   Left upper lobe pneumonia 06/27/2022   Iron  deficiency anemia 76/81/1572   Acute systolic heart failure (HCC)    Localized swelling of right lower extremity 06/24/2022   HLD (hyperlipidemia) 06/24/2022   Microcytic anemia 06/24/2022   Hypomagnesemia 06/24/2022   Acute pancreatitis 04/04/2020   Forearm fracture, right, closed, initial encounter 12/31/2016   Hypokalemia 12/31/2016   Closed displaced comminuted fracture of shaft of right radius    Fall at home, initial encounter    Hyponatremia 08/22/2015   GERD (gastroesophageal reflux disease) 08/22/2015   Tobacco abuse 08/22/2015   ETOH abuse 08/22/2015   Chest pain at rest 08/22/2015   Type 2 diabetes mellitus with hyperlipidemia ([place])    Pancreatitis 01/05/2013   CAD (coronary artery disease) 01/05/2013   Hypertension 01/05/2013   Hyperglycemia 01/05/2013   Elevated LFTs 01/05/2013   Discharged Condition: good  History of Present Illness:  [name] is a 56 yo AA male with known history of DM, HTN, Dyslipidemia,  Nicotine abuse for > 30 years, currently 5-6 per day, Alcohol use 3-4 x per week, and CAD with previous MI with subsequent stent placement in 2012 to LAD, Diagonal, Ramus, and OM.  He takes all home medications as prescribed.  The patient presented to the Emergency Department on 9/5 with complaints of chest pain that started on Sunday 9/3.  However, the patient stated he has been experiencing pain for approximately 1 month, but it has gotten worse.  The symptoms started at rest while  patient was watching football.  The pain occurred in the center of his chest with belching with associated shortness of breath, nausea/vomiting.  Workup in the ED showed reduced Oxygen saturations with improvement after 5L of oxygen.  CXR obtained showed evidence of vascular congestion.  Lab work was abnormal with elevated BNP at 639.  Troponin was at 139 but repeat was down to 106.  He was also noted to be anemic and patient admitted to experiencing black stools.  He has lower extremity  edema as well.  He was treated with Lasix, NTG was not administered due to use of Viagra the night prior to presentation.  He was transferred to [facility] for further evaluation.    Hospital Course:  Cardiology consult was obtained who ruled patient in for NSTEMI and Acute systolic HF/Ischemic Cardiomyopathy.  They recommended cardiac catheterization and TEE. TEE showed a reduced EF of 40-45%, severe severe mid to distal anterior, anteroapical and anterolateral hypokinesis and basal to mid inferior/inferolateral hypokinesis suggestive of multivessel territory ischemia/infarct and Moderate to severe Mitral Valve regurgitation. Catheterization performed on 9/6 showed advanced multivessel cor Neri artery disease with severe distal left main disease, very severe ostial circumflex stenosis, subtotal occlusion of the mid circumflex with occlusion of the OM branches, severe in-stent restenosis in the mid LAD, diffuse distal LAD disease, and severe mid RCA stenosis.  It was felt advanced heart failure team consult would be beneficial for medical optimization prior to surgical consultation.  He was evaluated by Dr. Haroldine Laws who initiated Milrinone for reduced PA pressures.  He was started on Aldactone and Entresto for GDMT.  They felt cardiac MRI would be needed to assess cardiac viability, this has not yet been completed.  They have closely monitored patient throughout his stay.  His blood sugars have been uncontrolled and diabetes coordinator has been assisting in insulin adjustment.  He was found to be anemic and has required blood transfusion with hemoglobin of 6.9 this morning.  He was started on antibiotics 9/7 for fever and suspected pneumonia with Rocephin and Zithromax.  The patient was evaluated by Dr. Lavonna Monarch for bypass surgery. At which time the patient stated he felt better overall since admission.  He denies chest pain and shortness of breath.  He does have chronic numbness of his right leg from  previous back procedure.  He asks if vein can be taken from his left leg. It was felt patient would be a candidate for surgery.  The risks and benefits of the procedure were explained to the patient and he was agreeable to proceed..  GI consult was obtained prior to surgery due to anemia and transfusion for blood loss on admission.  They performed EGD/Colonoscopy with polyp removal.  They felt the patient was stable to proceed with surgery.  Mr. Bellucci was taken to the operating room on 07/04/2022.  He underwent CABG x 4 with the left internal mammary artery grafted to the left anterior descending coronary artery.  A saphenous graft was placed to the acute marginal branch of the right coronary artery and a sequential saphenous vein graft was placed to the ramus intermediate and distal obtuse marginal coronary arteries. Following the procedure, he separated from cardiopulmonary bypass without any difficulty and without inotropic support.  He was transferred to the ICU in stable condition on Precedex and Neo-Synephrine. He was extubated the evening of surgery. Milrinone was added as CO/CI low and co ox were monitored daily. Advanced heart failure followed post op for management of  acute on chronic systolic CHF/ischemic.cardiomyopathy. Gordy Councilman, a line, chest tubes, and foley were removed early in his post operative course. He was weaned off the Insulin drip. He has a history of diabetes and pre op HGA1C was 7.9. He will need close medical follow up after discharge. He was put on Carvedilol and given Feraheme for ABL anemia.  The patient had mild tachycardia and his Coreg was increased to 6.25.  He was transitioned to oral Lasix, potassium.  CXR showed bilateral pleural effusions.  He was transitioned to a daily dose of Amiodarone which will be discontinued prior to discharge.  The patient's blood sugars were elevated and his home diabetic medications were adjusted as tolerated.  He was managed closely by the AHF  team and they placed patient on GDMT as tolerated.  He had a mild leukocytosis of 19K with no obvious source of infection.  This improved without further intervention.  His Lasix was stopped due to hyponatremia.  This also improved, however his volume status worsened.  He was resumed on Lasix for hypervolemia.  The patient diuresed well.  His creatinine and potassium remain in normal limits.  He remains in NSR.  He is ambulating without difficulty.  His surgical incisions are healing without evidence of infection.  He is medically stable for discharge home today.  Consults:  Advanced Heart Failure  Significant Diagnostic Studies:   Angiography:     Mid LM to Dist LM lesion is 75% stenosed.   Ost Cx to Prox Cx lesion is 90% stenosed.   Mid RCA lesion is 80% stenosed.   Prox RCA lesion is 50% stenosed.   Mid LAD lesion is 75% stenosed.   Mid Cx to Dist Cx lesion is 99% stenosed.   3rd Mrg lesion is 90% stenosed.   Ramus lesion is 80% stenosed.   Dist LAD lesion is 70% stenosed.   1.  Advanced multivessel cor Neri artery disease with severe distal left main disease, very severe ostial circumflex stenosis, subtotal occlusion of the mid circumflex with occlusion of the OM branches, severe in-stent restenosis in the mid LAD, diffuse distal LAD disease, and severe mid RCA stenosis 2.  Severe mitral regurgitation by noninvasive assessment and by hemodynamics with 34 mm V waves in the pulmonary wedge tracing 3.  Congestive heart failure with elevated wedge pressure of 26, low PA oxygen saturation of 49%, but preserved cardiac output of 5.4 L/min by Fick assessment 4.  Successful placement of right internal jugular triple-lumen catheter for infusion of milrinone and central monitoring if needed   Plan: med rx initially. Will have to sort out best approach at revascularization and treatment of MR. Advanced HF team consulted.   Treatments: surgery:   07/04/2022 Patient:  Judeen Hammans Pre-Op  Dx: Ischemic Cardiomyopathy with recent NSTEMI complicated with acute systolic CHF and moderate MR, CAD   Post-op Dx:  Same but no significant MR Procedure: CABG X 4 with LIMA to LAD and RSVG from aorta to RPDA (acute marginal extension), RSVG from aorta to Ramus sequenced to the distal OM  Endoscopic greater saphenous vein harvest on the Left leg     Surgeon and Role:      Coralie Common, MD- Primary    * Enid Cutter , PA-C - assisting An experienced assistant was required given the complexity of this surgery and the standard of surgical care. The assistant was needed for exposure, dissection, suctioning, retraction of delicate tissues and sutures, instrument exchange and for overall  help during this procedure and saphenous vein harvesting   Discharge Exam: Blood pressure 123/78, pulse 78, temperature 98 F (36.7 C), resp. rate 19, height 6' (1.829 m), weight 74.3 kg, SpO2 98 %.  General appearance: alert, cooperative, and no distress Heart: regular rate and rhythm Lungs: clear to auscultation bilaterally Abdomen: soft, non-tender; bowel sounds normal; no masses,  no organomegaly Extremities: edema 2+ pitting Wound: clean and dry     Discharge Medications:  The patient has been discharged on:   1.Beta Blocker:  Yes [ X  ]                              No   [   ]                              If No, reason:  2.Ace Inhibitor/ARB: Yes [ x  ]                                     No  [    ]                                     If No, reason:  3.Statin:   Yes [ X  ]                  No  [   ]                  If No, reason:  4.Ecasa:  Yes  [ X  ]                  No   [   ]                  If No, reason:  Patient had ACS upon admission:  Plavix/P2Y12 inhibitor: Yes [ X  ]                                      No  [   ]     Discharge Instructions     Amb Referral to Cardiac Rehabilitation   Complete by: As directed    Diagnosis: CABG   CABG X ___: 4   After  initial evaluation and assessments completed: Virtual Based Care may be provided alone or in conjunction with Phase 2 Cardiac Rehab based on patient barriers.: Yes   Intensive Cardiac Rehabilitation (ICR) [place] location only OR Traditional Cardiac Rehabilitation (TCR) *If criteria for ICR are not met will enroll in TCR [facility] only): Yes      Allergies as of 07/10/2022   No Known Allergies      Medication List     STOP taking these medications    aspirin 325 MG tablet Replaced by: aspirin EC 81 MG tablet   lisinopril 10 MG tablet Commonly known as: ZESTRIL   simvastatin 40 MG tablet Commonly known as: ZOCOR       TAKE these medications    aspirin EC 81 MG tablet Take 1 tablet (81 mg total) by mouth daily. Swallow whole. Replaces: aspirin 325 MG tablet  Notes to patient: Start on 07/11/22 in the morning   carvedilol 6.25 MG tablet Commonly known as: COREG Take 1 tablet (6.25 mg total) by mouth 2 (two) times daily with a meal. What changed:  medication strength how much to take Notes to patient: Start on 07/10/22 in the evening   clopidogrel 75 MG tablet Commonly known as: PLAVIX Take 1 tablet (75 mg total) by mouth daily.   empagliflozin 25 MG Tabs tablet Commonly known as: JARDIANCE Take 0.5 tablets by mouth daily. Notes to patient: Start on 07/11/22 in the morning   Entresto 24-26 MG Generic drug: sacubitril-valsartan Take 1 tablet by mouth 2 (two) times daily. Notes to patient: Start in the evening on 07/10/22   Fiber 625 MG Tabs Take 2 tablets by mouth daily. Notes to patient: Start on 07/11/22 in the morning   furosemide 40 MG tablet Commonly known as: Lasix Take 1 tablet (40 mg total) by mouth daily.   glipiZIDE 5 MG tablet Commonly known as: GLUCOTROL Take 5 mg by mouth daily. 30 minutes prior to a meal Notes to patient: Start on 07/11/22 in the morning   insulin glargine 100 UNIT/ML Solostar Pen Commonly known as: LANTUS Inject 50 Units into the skin  at bedtime. Notes to patient: Start on 07/10/22 at bedtime.    metFORMIN 500 MG 24 hr tablet Commonly known as: GLUCOPHAGE-XR Take 500 mg by mouth 2 (two) times daily with a meal. Notes to patient: Start in the evening on 07/10/22   ondansetron 8 MG tablet Commonly known as: ZOFRAN Take 4 mg by mouth 3 (three) times daily as needed for nausea.   oxyCODONE 5 MG immediate release tablet Commonly known as: Oxy IR/ROXICODONE Take 1 tablet (5 mg total) by mouth every 4 (four) hours as needed for severe pain.   potassium chloride SA 20 MEQ tablet Commonly known as: KLOR-CON M Take 1 tablet (20 mEq total) by mouth daily.   rosuvastatin 40 MG tablet Commonly known as: CRESTOR Take 1 tablet (40 mg total) by mouth daily.   sildenafil 100 MG tablet Commonly known as: VIAGRA Take 50 mg by mouth daily as needed for erectile dysfunction.   spironolactone 25 MG tablet Commonly known as: ALDACTONE Take 1 tablet (25 mg total) by mouth daily.   Vitamin D (Ergocalciferol) 1.25 MG (50000 UNIT) Caps capsule Commonly known as: DRISDOL Take 50,000 Units by mouth every 7 (seven) days. Notes to patient: Start as per home schedule        Follow-up Information     [facility] Follow up on 07/15/2022.   Specialty: Cardiology Why: at 3:30 Please bring all medications. Located in the [facility]. Entrance C. Contact information: [address] [id number] [place] [phone]        [facility] Follow up on 07/21/2022.   Specialty: Cardiothoracic Surgery Why: Appointment is at 1:00, please get CXR at 12:30 at [place] located on first floor of our office building Contact information: [place], [place] [phone]                Signed:  Ellwood Handler, PA-C  07/11/2022, 8:07  AM

## 2022-07-07 NOTE — Inpatient Diabetes Management (Signed)
Inpatient Diabetes Program Recommendations  AACE/ADA: New Consensus Statement on Inpatient Glycemic Control (2015)  Target Ranges:  Prepandial:   less than 140 mg/dL      Peak postprandial:   less than 180 mg/dL (1-2 hours)      Critically ill patients:  140 - 180 mg/dL   Lab Results  Component Value Date   GLUCAP 219 (H) 07/07/2022   HGBA1C 7.9 (H) 07/03/2022    Review of Glycemic Control  Latest Reference Range & Units 07/06/22 23:17 07/07/22 03:14 07/07/22 07:34 07/07/22 11:14  Glucose-Capillary 70 - 99 mg/dL 134 (H) 121 (H) 178 (H) 219 (H)  (H): Data is abnormally high Diabetes history: Type 2 DM Outpatient Diabetes medications: Glipizide 5 mg QD, Lantus 50 units QHS, Metformin 500 mg BID, Jardiance 0.5 mg QD Current orders for Inpatient glycemic control: Novolog 0-24 units TID  Inpatient Diabetes Program Recommendations:    Consider adding Semglee 10 units QD  Thanks, Bronson Curb, MSN, RNC-OB Diabetes Coordinator [phone] (8a-5p)

## 2022-07-08 ENCOUNTER — Inpatient Hospital Stay (HOSPITAL_COMMUNITY): Payer: No Typology Code available for payment source

## 2022-07-08 DIAGNOSIS — I340 Nonrheumatic mitral (valve) insufficiency: Secondary | ICD-10-CM

## 2022-07-08 LAB — GLUCOSE, CAPILLARY
Glucose-Capillary: 227 mg/dL — ABNORMAL HIGH (ref 70–99)
Glucose-Capillary: 228 mg/dL — ABNORMAL HIGH (ref 70–99)
Glucose-Capillary: 121 mg/dL — ABNORMAL HIGH (ref 70–99)
Glucose-Capillary: 190 mg/dL — ABNORMAL HIGH (ref 70–99)

## 2022-07-08 LAB — CBC
HCT: 24.9 % — ABNORMAL LOW (ref 39.0–52.0)
Hemoglobin: 7.7 g/dL — ABNORMAL LOW (ref 13.0–17.0)
MCH: 25.9 pg — ABNORMAL LOW (ref 26.0–34.0)
MCHC: 30.9 g/dL (ref 30.0–36.0)
MCV: 83.8 fL (ref 80.0–100.0)
Platelets: 461 10*3/uL — ABNORMAL HIGH (ref 150–400)
RBC: 2.97 MIL/uL — ABNORMAL LOW (ref 4.22–5.81)
RDW: 20.6 % — ABNORMAL HIGH (ref 11.5–15.5)
WBC: 13.7 10*3/uL — ABNORMAL HIGH (ref 4.0–10.5)
nRBC: 0 % (ref 0.0–0.2)

## 2022-07-08 LAB — COMPREHENSIVE METABOLIC PANEL
ALT: 20 U/L (ref 0–44)
AST: 21 U/L (ref 15–41)
Albumin: 2.6 g/dL — ABNORMAL LOW (ref 3.5–5.0)
Alkaline Phosphatase: 76 U/L (ref 38–126)
Anion gap: 6 (ref 5–15)
BUN: 8 mg/dL (ref 6–20)
CO2: 29 mmol/L (ref 22–32)
Calcium: 9.2 mg/dL (ref 8.9–10.3)
Chloride: 97 mmol/L — ABNORMAL LOW (ref 98–111)
Creatinine, Ser: 0.97 mg/dL (ref 0.61–1.24)
GFR, Estimated: >60 mL/min (ref >60)
Glucose, Bld: 232 mg/dL — ABNORMAL HIGH (ref 70–99)
Potassium: 4.8 mmol/L (ref 3.5–5.1)
Sodium: 132 mmol/L — ABNORMAL LOW (ref 135–145)
Total Bilirubin: 0.7 mg/dL (ref 0.3–1.2)
Total Protein: 6.9 g/dL (ref 6.5–8.1)

## 2022-07-08 LAB — ECHOCARDIOGRAM LIMITED
Height: 72 in
MV M vel: 4.25 m/s
MV Peak grad: 72.3 mmHg
S' Lateral: 4.1 cm
Single Plane A2C EF: 43.8 %
Weight: 2709.01 oz

## 2022-07-08 MED ORDER — LOSARTAN POTASSIUM 25 MG PO TABS
12.5000 mg | ORAL_TABLET | Freq: Every day | ORAL | Status: DC
  Administered 2022-07-08 – 2022-07-09 (×2): 12.5 mg via ORAL
  Filled 2022-07-08 (×2): qty 1

## 2022-07-08 MED ORDER — INSULIN GLARGINE-YFGN 100 UNIT/ML ~~LOC~~ SOLN: 50.0000 [IU] | Freq: Every day | SUBCUTANEOUS | Status: DC

## 2022-07-08 MED ORDER — INSULIN GLARGINE-YFGN 100 UNIT/ML ~~LOC~~ SOLN
25.0000 [IU] | Freq: Every day | SUBCUTANEOUS | Status: DC
  Administered 2022-07-08 – 2022-07-09 (×2): 25 [IU] via SUBCUTANEOUS
  Filled 2022-07-08 (×4): qty 0.25

## 2022-07-08 MED ORDER — METFORMIN HCL 500 MG PO TABS
500.0000 mg | ORAL_TABLET | Freq: Two times a day (BID) | ORAL | Status: DC
  Administered 2022-07-08 – 2022-07-10 (×5): 500 mg via ORAL
  Filled 2022-07-08 (×5): qty 1

## 2022-07-08 MED ORDER — EMPAGLIFLOZIN 10 MG PO TABS
10.0000 mg | ORAL_TABLET | Freq: Every day | ORAL | Status: DC
  Administered 2022-07-08 – 2022-07-10 (×3): 10 mg via ORAL
  Filled 2022-07-08 (×3): qty 1

## 2022-07-08 NOTE — Plan of Care (Signed)

## 2022-07-08 NOTE — Progress Notes (Signed)
CARDIAC REHAB PHASE I     Pt sitting in chair. He is feeling well today. He has just walked in halls and reports tolerating well. He was able to walk 5 times yesterday. Pt reports he is working on maintaining sternal precautions and move in tub guidelines. He is using IS frequently and was able to reach 1000 today.encouraged to get in two more walks today. Will return to offer walk later today as time permits. Home OHS teaching including risk factors, smoking cessation, heart healthy diabetic diet, home needs at discharge, sternal precautions, move in tub, exercise guidelines, site care, restrictions, IS use at home and CRP2 reviewed. All questions and concerns addressed. Will continue to follow.    [id number] Vanessa Barbara, RN BSN 07/08/2022 11:30 AM

## 2022-07-08 NOTE — Progress Notes (Addendum)
      [address]       [place] [address]             [phone]      4 Days Post-Op Procedure(s) (LRB): CORONARY ARTERY BYPASS GRAFTING (CABG) X4, USING LEFT GREATER SAPHENOUS VEIN. (N/A) TRANSESOPHAGEAL ECHOCARDIOGRAM (TEE) (N/A)  Subjective:  Patient sitting up in chair eating breakfast.   He is ambulating without difficulty.  No DME needs  + BM  Objective: Vital signs in last 24 hours: Temp:  [98.6 F (37 C)-99.5 F (37.5 C)] 98.6 F (37 C) (09/19 0400) Pulse Rate:  [74-90] 79 (09/19 0700) Cardiac Rhythm: Normal sinus rhythm (09/19 0400) Resp:  [12-26] 23 (09/19 0700) BP: (97-143)/(60-90) 121/72 (09/19 0700) SpO2:  [90 %-100 %] 94 % (09/19 0700) Weight:  [76.8 kg] 76.8 kg (09/19 0500)  Intake/Output from previous day: 09/18 0701 - 09/19 0700 In: 840 [P.O.:840] Out: 1058 [Urine:1058]  General appearance: alert, cooperative, and no distress Heart: regular rate and rhythm Lungs: clear to auscultation bilaterally Abdomen: soft, non-tender; bowel sounds normal; no masses,  no organomegaly Extremities: edema trace 1+ Wound: clean and dry  Lab Results: Recent Labs    07/06/22 0402 07/06/22 0829 07/07/22 0902  WBC 18.2*  --  19.8*  HGB 7.0* 7.0* 8.1*  HCT 22.3* 22.0* 26.0*  PLT 317  --  425*   BMET:  Recent Labs    07/06/22 0402 07/07/22 0902  NA 129* 128*  K 4.7 4.5  CL 97* 92*  CO2 23 23  GLUCOSE 179* 246*  BUN 12 11  CREATININE 0.87 0.92  CALCIUM 8.6* 9.3    PT/INR: No results for input(s): "LABPROT", "INR" in the last 72 hours. ABG    Component Value Date/Time   PHART 7.374 07/04/2022 1931   HCO3 25.9 07/04/2022 1931   TCO2 27 07/04/2022 1931   O2SAT 54 07/06/2022 0400   CBG (last 3)  Recent Labs    07/07/22 2126 07/07/22 2348 07/08/22 0652  GLUCAP 298* 212* 227*    Assessment/Plan: S/P Procedure(s) (LRB): CORONARY ARTERY BYPASS GRAFTING (CABG) X4, USING LEFT GREATER SAPHENOUS VEIN. (N/A) TRANSESOPHAGEAL  ECHOCARDIOGRAM (TEE) (N/A)  CV- NSR in the 80s, BP stable- continue Amiodarone 400 daily which we will d/c at discharge, continue Coreg at 6.25, Aldactone added by AHF yesterday, continue Plavix Pulm- off oxygen, no acute issues, continue IS Renal- creatinine remains stable, remains edematous on exam, good UOP continue Lasix, potassium Expected post operative blood loss anemia, mild Hgb at 8.0 yesterday ID- surgical incisions are healing w/o evidence of infection, + leukocytosis with ct up to 19K, afebrile,  DM- sugars elevated at 250-300s- will resume home metformin, insulin at reduced dose.. can restart Jardiance, glybizide as able Dispo- patient overall doing well, maintaining NSR, AHF is following adding GDMT as patient tolerates, will monitor glucose levels closely continue to resume medications as tolerated, ? Leukocytosis up to 19K, unsure of source monitor, awaiting bed of 4E.. I suspect he should be ready for d/c soon   LOS: 14 days    Ellwood Handler, PA-C' 07/08/2022  I have reviewed above note and agree with findings and plan. Awaiting WBC this am and follow Na. Will hold diuretics today and check CXR tomorrow.(Follow up atelectasis vs effusion). Needs to ambulate.

## 2022-07-08 NOTE — Progress Notes (Signed)
  Echocardiogram 2D Echocardiogram has been performed.  [name] 07/08/2022, 2:42 PM

## 2022-07-08 NOTE — Inpatient Diabetes Management (Signed)
Inpatient Diabetes Program Recommendations  AACE/ADA: New Consensus Statement on Inpatient Glycemic Control   Target Ranges:  Prepandial:   less than 140 mg/dL      Peak postprandial:   less than 180 mg/dL (1-2 hours)      Critically ill patients:  140 - 180 mg/dL    Latest Reference Range & Units 07/08/22 06:52  Glucose-Capillary 70 - 99 mg/dL 227 (H)  Novolog 8 units    Latest Reference Range & Units 07/07/22 07:34 07/07/22 11:14 07/07/22 16:15 07/07/22 21:23 07/07/22 21:26 07/07/22 23:48  Glucose-Capillary 70 - 99 mg/dL 178 (H) 219 (H)  Novolog 8 units 141 (H)  Novolog 2 units 310 (H) 298 (H) 212 (H)    Review of Glycemic Control  Diabetes history: DM2 Outpatient Diabetes medications: Glipizide 5 mg daily, Lantus 50 units QHS, Metformin XR 500 mg BID, Jardiance 12.5 mg daily Current orders for Inpatient glycemic control: Semglee 25 units QHS, Novolog 0-24 units TID with meals, Jardiance 10 mg daily, Metformin 500 mg BID  Inpatient Diabetes Program Recommendations:    Insulin: Noted Semglee 25 units QHS and Jardiance 10 mg daily ordered today. May want to consider decreasing Semglee to 10 units QHS.  Thanks, Barnie Alderman, RN, MSN, [place] Diabetes Coordinator Inpatient Diabetes Program [phone] (Team Pager from 8am to [place])

## 2022-07-08 NOTE — Progress Notes (Addendum)
Patient ID: [name], male   DOB: 1966-04-01, 56 y.o.   MRN: [id number]   Advanced Heart Failure Rounding Note  PCP-Cardiologist: None   Subjective:    09/06: Started milrinone 0.125 to facilitate diuresis in setting of low-output 09/07: Milrinone increased to 0.25. 09/09: Milrinone decreased to 0.125  09/11: 1uPRBc  9/12: EGD/ [facility]>>Non-obstructing Schatzki ring, hiatal hernia, normal stomach, 2 polyps (resected, path pending), diverticulosis, non bleeding hemorrhoids. VCE pending . 9/13: VCE- no active bleeding, no AVMs, ulcers, mass. 9/14: cMRI EF 29% with viable myocardium. Mild to moderate MR  9/15: s/p CABG x4 (LIMA-LAD RSVG from aorta to RPDA (acute marginal extension), RSVG from aorta to Ramus sequenced to the distal OM. Perioperative TEE showed only mild-mod MR. No indication for MVR.  9/17 - GivenFeraheme. Milrinone stopped.  9/18 Started on spiro.   POD #4.  Walked 5 times around the nursing unit. Denies SOB.   Objective:   Weight Range: 76.8 kg Body mass index is 22.96 kg/m.   Vital Signs:   Temp:  [98.6 F (37 C)-99.4 F (37.4 C)] 98.6 F (37 C) (09/19 0800) Pulse Rate:  [74-89] 79 (09/19 0700) Resp:  [12-26] 23 (09/19 0700) BP: (97-143)/(60-90) 121/72 (09/19 0700) SpO2:  [90 %-100 %] 94 % (09/19 0700) Weight:  [76.8 kg] 76.8 kg (09/19 0500) Last BM Date : 07/06/22  Weight change: Filed Weights   07/06/22 0500 07/07/22 0200 07/08/22 0500  Weight: 78 kg 75.3 kg 76.8 kg    Intake/Output:   Intake/Output Summary (Last 24 hours) at 07/08/2022 0849 Last data filed at 07/08/2022 0800 Gross per 24 hour  Intake 840 ml  Output 948 ml  Net -108 ml     Physical Exam  General:  Sitting in the chair. No resp difficulty HEENT: normal Neck: supple. no JVD. Carotids 2+ bilat; no bruits. No lymphadenopathy or thryomegaly appreciated. Cor: PMI nondisplaced. Regular rate & rhythm. No rubs, gallops or murmurs. Lungs: Decreased in the bases Abdomen: soft,  nontender, nondistended. No hepatosplenomegaly. No bruits or masses. Good bowel sounds. Extremities: no cyanosis, clubbing, rash, edema Neuro: alert & orientedx3, cranial nerves grossly intact. moves all 4 extremities w/o difficulty. Affect pleasant  Telemetry  SR 70-90 personally checked.  Labs    CBC Recent Labs    07/06/22 0402 07/06/22 0829 07/07/22 0902  WBC 18.2*  --  19.8*  NEUTROABS 14.7*  --  17.0*  HGB 7.0* 7.0* 8.1*  HCT 22.3* 22.0* 26.0*  MCV 82.0  --  82.8  PLT 317  --  798*   Basic Metabolic Panel Recent Labs    07/05/22 1627 07/06/22 0402 07/07/22 0902 07/08/22 0553  NA 130*   < > 128* 132*  K 4.5   < > 4.5 4.8  CL 99   < > 92* 97*  CO2 21*   < > 23 29  GLUCOSE 178*   < > 246* 232*  BUN 9   < > 11 8  CREATININE 0.87   < > 0.92 0.97  CALCIUM 8.9   < > 9.3 9.2  MG 1.7  --   --   --    < > = values in this interval not displayed.   Liver Function Tests Recent Labs    07/07/22 0902 07/08/22 0553  AST 25 21  ALT 23 20  ALKPHOS 71 76  BILITOT 0.8 0.7  PROT 7.1 6.9  ALBUMIN 2.9* 2.6*    No results for input(s): "LIPASE", "AMYLASE" in the last  72 hours. Cardiac Enzymes No results for input(s): "CKTOTAL", "CKMB", "CKMBINDEX", "TROPONINI" in the last 72 hours.  BNP: BNP (last 3 results) Recent Labs    06/24/22 0809 06/25/22 0133  BNP 639.0* 830.5*    ProBNP (last 3 results) No results for input(s): "PROBNP" in the last 8760 hours.   D-Dimer No results for input(s): "DDIMER" in the last 72 hours. Hemoglobin A1C No results for input(s): "HGBA1C" in the last 72 hours.  Fasting Lipid Panel No results for input(s): "CHOL", "HDL", "LDLCALC", "TRIG", "CHOLHDL", "LDLDIRECT" in the last 72 hours.  Thyroid Function Tests No results for input(s): "TSH", "T4TOTAL", "T3FREE", "THYROIDAB" in the last 72 hours.  Invalid input(s): "FREET3"  Other results:   Imaging    No results found.   Medications:     Scheduled Medications:   sodium chloride   Intravenous Once   acetaminophen  1,000 mg Oral Q6H   Or   acetaminophen (TYLENOL) oral liquid 160 mg/5 mL  1,000 mg Per Tube Q6H   amiodarone  400 mg Oral Daily   aspirin EC  81 mg Oral Daily   bisacodyl  10 mg Oral Daily   Or   bisacodyl  10 mg Rectal Daily   carvedilol  6.25 mg Oral BID WC   Chlorhexidine Gluconate Cloth  6 each Topical Q0600   clopidogrel  75 mg Oral Daily   docusate sodium  200 mg Oral Daily   ferrous sulfate  325 mg Oral TID WC   insulin aspart  0-24 Units Subcutaneous TID WC   insulin glargine-yfgn  25 Units Subcutaneous QHS   metFORMIN  500 mg Oral BID WC   pantoprazole  40 mg Oral Daily   rosuvastatin  40 mg Oral Daily   sodium chloride flush  3 mL Intravenous Q12H   spironolactone  12.5 mg Oral Daily    Infusions:  lactated ringers      PRN Medications: metoprolol tartrate, ondansetron (ZOFRAN) IV, oxyCODONE, sodium chloride flush, traMADol    Patient Profile   57 y/o AAM w/ h/o multivessel CAD s/p remote MI in 2012 and multiple PCIs to LAD, diag, ramus and OM vessels, HTN, HLD, Type 2 DM and tobacco use, admitted for NSTEMI and acute CHF. 2D Echo and TEE w/ severe mitral regurgitation. LVEF 40-45%, RV ok. Cath w/ multivessel CAD including high grade LM disease, elevated filling pressures and preserved CO. S/p CABG x 4 on 9/15.    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 No indication for MVR   2.  Acute on chronic systolic CHF/Ischemic cardiomyopathy - Echo with EF 40-45%.  - LHC w/ MVCAD  - RHC w/ RA 8, PA 48/23 (38), PCWP mean 26, LVEDP 21,  PA sat 49% - Optimized w/ milrinone prior to CABG - S/p CABG x 4 on 07/04/22 - Continue spiro 12.5 mg daily  -Continue Carvedilol  - Restart jardiance 10 mg daily - Add 12.5  mg losartan.  - Renal function stable.   3. NSTEMI w/ Multivessel CAD  - Known hx prior CAD with MI in 2012 and prior multivessel PCI - HS trop peak 1,477 - LHC w/ severe MVCAD including high grade LM disease  - cMRI w/ viable myocardium  - s/p CABG x 4  - ASA + high intensity  statin   4. Type 2DM  - per primary team  - A1c 8.2%  - Adding jardiance today. He was on this prior to admit.   5. IDA + EBLA  - IDA at baseline, required transfusion on admit - no obvious source blood loss, FOBT negative.  - Iron  21, sats 4%, treated w/ IV feraheme - GI consulted. EGD/ [facility]>>Non-obstructing Schatzki ring, hiatal hernia, normal stomach, 2 polyps (resected, path pending), diverticulosis, non bleeding hemorrhoids - VCE: no active bleeding, no AVMs, ulcers, mass. - Rec'd 2u RBCs 9/15.   - Given Feraheme 9/17  - hgb 6.2>7.7>7> pending.Transfuse hgb < 7  6. ETOH and tobacco abuse - cessation advised   Ambulate. Ok to transfer.  HF F/U set up.   Length of Stay: [place], NP-C  07/08/2022, 8:49 AM  Advanced Heart Failure Team Pager [phone] (M-F; 7a - 5p)  Please contact [facility] for night-coverage after hours (5p -7a ) and weekends on [email or web address]  Patient seen and examined with the above-signed Advanced Practice Provider and/or Housestaff. I personally reviewed laboratory data, imaging studies and relevant notes. I independently examined the patient and formulated the important aspects of the plan. I have edited the note to reflect any of my changes or salient points. I have personally discussed the plan with the patient and/or family.  Ambulating. Denies CP or SOB. WBC coming down. Rhythm stable.   General:  Well appearing. No resp difficulty HEENT: normal Neck: supple. no JVD. Carotids 2+ bilat; no bruits. No lymphadenopathy or thryomegaly appreciated. Cor: Sternal wound ok Regular rate & rhythm. No rubs, gallops or murmurs. Lungs: clear Dull at left base Abdomen: soft,  nontender, nondistended. No hepatosplenomegaly. No bruits or masses. Good bowel sounds. Extremities: no cyanosis, clubbing, rash, edema Neuro: alert & orientedx3, cranial nerves grossly intact. moves all 4 extremities w/o difficulty. Affect pleasant  Doing well. WBC coming down. Continue to ambulate and encourage IS. GDMT as above. Will get limited echo today to reassess MV prior to d/c  Glori Bickers, MD  10:16 AM

## 2022-07-08 NOTE — TOC Progression Note (Signed)
Transition of Care [facility]) - Progression Note    Patient Details  Name: [name] MRN: [id number] Date of Birth: Apr 25, 1966  Transition of Care [facility]) CM/SW Contact  Graves-Bigelow, Ocie Cornfield, RN Phone Number: 07/08/2022, 2:47 PM  Clinical Narrative:  Patient POD-4 CABG. Case Manager received a call from the [place]. Patient is a member of the [facility] and PCP is Dani Gobble, [place] is Harriet Pho [phone] ext [phone]. Case Manager will continue to follow for transition of care needs as the patient progresses.   Expected Discharge Plan: Home/Self Care Barriers to Discharge: Continued Medical Work up  Expected Discharge Plan and Services Expected Discharge Plan: Home/Self Care   Discharge Planning Services: CM Consult   Living arrangements for the past 2 months: Apartment                    Readmission Risk Interventions     No data to display

## 2022-07-08 NOTE — Progress Notes (Signed)
   07/08/22 2116  Vitals  Temp 98.3 F (36.8 C)  Temp Source Oral  BP 118/77  MAP (mmHg) 90  BP Location Right Arm  BP Method Automatic  Patient Position (if appropriate) Lying  Pulse Rate 81  Pulse Rate Source Monitor  ECG Heart Rate 79  Resp 18  Level of Consciousness  Level of Consciousness Alert  MEWS COLOR  MEWS Score Color Green  Oxygen Therapy  SpO2 95 %  O2 Device Room Air  Pain Assessment  Pain Scale 0-10  Pain Score 0  MEWS Score  MEWS Temp 0  MEWS Systolic 0  MEWS Pulse 0  MEWS RR 0  MEWS LOC 0  MEWS Score 0   Pt transferred from [place]. Pt oriented to unit. Pt denies pain. CCMD called. Call light by pt.

## 2022-07-09 ENCOUNTER — Inpatient Hospital Stay (HOSPITAL_COMMUNITY): Payer: No Typology Code available for payment source

## 2022-07-09 LAB — CBC
HCT: 24.9 % — ABNORMAL LOW (ref 39.0–52.0)
Hemoglobin: 7.7 g/dL — ABNORMAL LOW (ref 13.0–17.0)
MCH: 25.9 pg — ABNORMAL LOW (ref 26.0–34.0)
MCHC: 30.9 g/dL (ref 30.0–36.0)
MCV: 83.8 fL (ref 80.0–100.0)
Platelets: 536 10*3/uL — ABNORMAL HIGH (ref 150–400)
RBC: 2.97 MIL/uL — ABNORMAL LOW (ref 4.22–5.81)
RDW: 21.1 % — ABNORMAL HIGH (ref 11.5–15.5)
WBC: 15 10*3/uL — ABNORMAL HIGH (ref 4.0–10.5)
nRBC: 0 % (ref 0.0–0.2)

## 2022-07-09 LAB — BASIC METABOLIC PANEL
Anion gap: 10 (ref 5–15)
BUN: 8 mg/dL (ref 6–20)
CO2: 26 mmol/L (ref 22–32)
Calcium: 9.5 mg/dL (ref 8.9–10.3)
Chloride: 97 mmol/L — ABNORMAL LOW (ref 98–111)
Creatinine, Ser: 1 mg/dL (ref 0.61–1.24)
GFR, Estimated: >60 mL/min (ref >60)
Glucose, Bld: 125 mg/dL — ABNORMAL HIGH (ref 70–99)
Potassium: 3.9 mmol/L (ref 3.5–5.1)
Sodium: 133 mmol/L — ABNORMAL LOW (ref 135–145)

## 2022-07-09 LAB — GLUCOSE, CAPILLARY
Glucose-Capillary: 112 mg/dL — ABNORMAL HIGH (ref 70–99)
Glucose-Capillary: 140 mg/dL — ABNORMAL HIGH (ref 70–99)
Glucose-Capillary: 225 mg/dL — ABNORMAL HIGH (ref 70–99)
Glucose-Capillary: 107 mg/dL — ABNORMAL HIGH (ref 70–99)

## 2022-07-09 MED ORDER — ENSURE ENLIVE PO LIQD
237.0000 mL | Freq: Two times a day (BID) | ORAL | Status: DC
  Administered 2022-07-09 – 2022-07-10 (×4): 237 mL via ORAL

## 2022-07-09 MED ORDER — FUROSEMIDE 10 MG/ML IJ SOLN
40.0000 mg | Freq: Once | INTRAMUSCULAR | Status: AC
  Administered 2022-07-09: 40 mg via INTRAVENOUS
  Filled 2022-07-09: qty 4

## 2022-07-09 MED ORDER — SACUBITRIL-VALSARTAN 24-26 MG PO TABS
1.0000 | ORAL_TABLET | Freq: Two times a day (BID) | ORAL | Status: DC
  Administered 2022-07-09 – 2022-07-10 (×2): 1 via ORAL
  Filled 2022-07-09 (×2): qty 1

## 2022-07-09 MED ORDER — POTASSIUM CHLORIDE CRYS ER 20 MEQ PO TBCR
20.0000 meq | EXTENDED_RELEASE_TABLET | Freq: Every day | ORAL | Status: DC
  Administered 2022-07-09 – 2022-07-10 (×2): 20 meq via ORAL
  Filled 2022-07-09 (×2): qty 1

## 2022-07-09 MED ORDER — FUROSEMIDE 40 MG PO TABS
40.0000 mg | ORAL_TABLET | Freq: Two times a day (BID) | ORAL | Status: DC
  Administered 2022-07-09: 40 mg via ORAL
  Filled 2022-07-09: qty 1

## 2022-07-09 MED FILL — Potassium Chloride Inj 2 mEq/ML: INTRAVENOUS | Qty: 40 | Status: AC

## 2022-07-09 MED FILL — Magnesium Sulfate Inj 50%: INTRAMUSCULAR | Qty: 10 | Status: AC

## 2022-07-09 MED FILL — Heparin Sodium (Porcine) Inj 1000 Unit/ML: Qty: 1000 | Status: AC

## 2022-07-09 NOTE — Progress Notes (Signed)
REDS Clip  READING = 41% (20-35% = normal     >35% = volume overload)   CHEST RULER = 34 in  Thank you for allowing pharmacy to participate in this patient's care.  Reatha Harps, PharmD PGY2 Pharmacy Resident 07/09/2022 12:33 PM Check [email or web address] for unit specific pharmacy number

## 2022-07-09 NOTE — Progress Notes (Addendum)
      [address]       [place] [address]             [phone]      5 Days Post-Op Procedure(s) (LRB): CORONARY ARTERY BYPASS GRAFTING (CABG) X4, USING LEFT GREATER SAPHENOUS VEIN. (N/A) TRANSESOPHAGEAL ECHOCARDIOGRAM (TEE) (N/A)  Subjective:  Patient continues to do well.  He denies pain, shortness of breath.  + ambulation   + BM  Objective: Vital signs in last 24 hours: Temp:  [98.3 F (36.8 C)-99.1 F (37.3 C)] 98.7 F (37.1 C) (09/20 0459) Pulse Rate:  [71-85] 82 (09/20 0459) Cardiac Rhythm: Normal sinus rhythm (09/19 2140) Resp:  [16-31] 17 (09/20 0459) BP: (101-149)/(46-112) 138/86 (09/20 0459) SpO2:  [91 %-100 %] 100 % (09/20 0459)  Intake/Output from previous day: 09/19 0701 - 09/20 0700 In: 240 [P.O.:240] Out: 1150 [Urine:1150]  General appearance: alert, cooperative, and no distress Heart: regular rate and rhythm Lungs: clear to auscultation bilaterally Abdomen: soft, non-tender; bowel sounds normal; no masses,  no organomegaly Extremities: edema 1-2+ pitting Wound: clean and dry  Lab Results: Recent Labs    07/08/22 0553 07/09/22 0323  WBC 13.7* 15.0*  HGB 7.7* 7.7*  HCT 24.9* 24.9*  PLT 461* 536*   BMET:  Recent Labs    07/08/22 0553 07/09/22 0323  NA 132* 133*  K 4.8 3.9  CL 97* 97*  CO2 29 26  GLUCOSE 232* 125*  BUN 8 8  CREATININE 0.97 1.00  CALCIUM 9.2 9.5    PT/INR: No results for input(s): "LABPROT", "INR" in the last 72 hours. ABG    Component Value Date/Time   PHART 7.374 07/04/2022 1931   HCO3 25.9 07/04/2022 1931   TCO2 27 07/04/2022 1931   O2SAT 54 07/06/2022 0400   CBG (last 3)  Recent Labs    07/08/22 1556 07/08/22 2118 07/09/22 0602  GLUCAP 121* 190* 112*    Assessment/Plan: S/P Procedure(s) (LRB): CORONARY ARTERY BYPASS GRAFTING (CABG) X4, USING LEFT GREATER SAPHENOUS VEIN. (N/A) TRANSESOPHAGEAL ECHOCARDIOGRAM (TEE) (N/A)  CV- NSR, BP stable- continue Amiodarone, Coreg, Cozaar,  Aldactone Pulm- CXR with improvement of pleural effusion, mild atelectasis persist, continue IS Renal- creatinine, potassium stable- remains quite edematous- will add TED hose, Lasix 40 mg BID Expected post operative blood loss anemia, stable ID- leukocytosis improving, down to 15K, no evidence of infection DM- sugars are improved, continue insulin, Jardiance, Metformin DIspo- patient doing well, needs to diurese further, suspect ready for d/c soon   LOS: 15 days   Ellwood Handler, PA-C 07/09/2022  Agree with above and plan. Hopefully home when arrangements made. CXR improved.

## 2022-07-09 NOTE — Progress Notes (Signed)
Mobility Specialist Progress Note    07/09/22 1354  Mobility  Activity Ambulated independently in hallway  Level of Assistance Independent  Assistive Device None  Distance Ambulated (ft) 800 ft  Activity Response Tolerated well  $Mobility charge 1 Mobility   Pre-Mobility: 76 HR During Mobility: 83 HR Post-Mobility: 76 HR  Pt received in chair and agreeable. No complaints on walk. Returned to chair with call bell in reach.    Hildred Alamin Mobility Specialist

## 2022-07-09 NOTE — Progress Notes (Signed)
CARDIAC REHAB PHASE I   Stopped by to see pt. He is feeling good today. He is very anxious to go home. He is ambulating independently several times per day. He is moving well using good sternal precautions.He is able to reach 1000-1200 with IS. Encouraged to continue use. Reviewed education for home. Pt has no questions or concerns at this time. Will continue to follow.   [phone]  Vanessa Barbara, RN BSN 07/09/2022 11:59 AM

## 2022-07-09 NOTE — Progress Notes (Addendum)
Patient ID: [name], male   DOB: February 02, 1966, 56 y.o.   MRN: [id number]   Advanced Heart Failure Rounding Note  PCP-Cardiologist: None   Subjective:    09/06: Started milrinone 0.125 to facilitate diuresis in setting of low-output 09/07: Milrinone increased to 0.25. 09/09: Milrinone decreased to 0.125  09/11: 1uPRBc  9/12: EGD/ [facility]>>Non-obstructing Schatzki ring, hiatal hernia, normal stomach, 2 polyps (resected, path pending), diverticulosis, non bleeding hemorrhoids. VCE pending . 9/13: VCE- no active bleeding, no AVMs, ulcers, mass. 9/14: cMRI EF 29% with viable myocardium. Mild to moderate MR  9/15: s/p CABG x4 (LIMA-LAD RSVG from aorta to RPDA (acute marginal extension), RSVG from aorta to Ramus sequenced to the distal OM. Perioperative TEE showed only mild-mod MR. No indication for MVR.  9/17 - GivenFeraheme. Milrinone stopped.  9/18 Started on spiro.  9/19 Started jardiance.   POD #5  Feels ok. Complaining of leg edema. Walking in the unit.   Objective:   Weight Range: 76.8 kg Body mass index is 22.96 kg/m.   Vital Signs:   Temp:  [98.3 F (36.8 C)-99.1 F (37.3 C)] 98.7 F (37.1 C) (09/20 0459) Pulse Rate:  [71-82] 82 (09/20 0459) Resp:  [16-27] 17 (09/20 0459) BP: (101-138)/(57-86) 138/86 (09/20 0459) SpO2:  [92 %-100 %] 100 % (09/20 0459) Last BM Date : 07/08/22  Weight change: Filed Weights   07/06/22 0500 07/07/22 0200 07/08/22 0500  Weight: 78 kg 75.3 kg 76.8 kg    Intake/Output:   Intake/Output Summary (Last 24 hours) at 07/09/2022 1052 Last data filed at 07/09/2022 0800 Gross per 24 hour  Intake --  Output 1350 ml  Net -1350 ml     Physical Exam  General: Sitting in the chair.  No resp difficulty HEENT: normal Neck: supple. JVP 9-10  Carotids 2+ bilat; no bruits. No lymphadenopathy or thryomegaly appreciated. Cor: PMI nondisplaced. Regular rate & rhythm. No rubs, gallops or murmurs. Lungs: clear Abdomen: soft, nontender,  nondistended. No hepatosplenomegaly. No bruits or masses. Good bowel sounds. Extremities: no cyanosis, clubbing, rash, R and LLE 1+ edema Neuro: alert & orientedx3, cranial nerves grossly intact. moves all 4 extremities w/o difficulty. Affect pleasant  Telemetry  SR 70-90 personally checked.  Labs    CBC Recent Labs    07/07/22 0902 07/08/22 0553 07/09/22 0323  WBC 19.8* 13.7* 15.0*  NEUTROABS 17.0*  --   --   HGB 8.1* 7.7* 7.7*  HCT 26.0* 24.9* 24.9*  MCV 82.8 83.8 83.8  PLT 425* 461* 798*   Basic Metabolic Panel Recent Labs    07/08/22 0553 07/09/22 0323  NA 132* 133*  K 4.8 3.9  CL 97* 97*  CO2 29 26  GLUCOSE 232* 125*  BUN 8 8  CREATININE 0.97 1.00  CALCIUM 9.2 9.5   Liver Function Tests Recent Labs    07/07/22 0902 07/08/22 0553  AST 25 21  ALT 23 20  ALKPHOS 71 76  BILITOT 0.8 0.7  PROT 7.1 6.9  ALBUMIN 2.9* 2.6*    No results for input(s): "LIPASE", "AMYLASE" in the last 72 hours. Cardiac Enzymes No results for input(s): "CKTOTAL", "CKMB", "CKMBINDEX", "TROPONINI" in the last 72 hours.  BNP: BNP (last 3 results) Recent Labs    06/24/22 0809 06/25/22 0133  BNP 639.0* 830.5*    ProBNP (last 3 results) No results for input(s): "PROBNP" in the last 8760 hours.   D-Dimer No results for input(s): "DDIMER" in the last 72 hours. Hemoglobin A1C No results for input(s): "HGBA1C"  in the last 72 hours.  Fasting Lipid Panel No results for input(s): "CHOL", "HDL", "LDLCALC", "TRIG", "CHOLHDL", "LDLDIRECT" in the last 72 hours.  Thyroid Function Tests No results for input(s): "TSH", "T4TOTAL", "T3FREE", "THYROIDAB" in the last 72 hours.  Invalid input(s): "FREET3"  Other results:   Imaging    DG Chest 2 View  Result Date: 07/09/2022 CLINICAL DATA:  Status post coronary artery bypass graft. EXAM: CHEST - 2 VIEW COMPARISON:  July 07, 2022. FINDINGS: Stable cardiomegaly. Status post coronary bypass graft. Mild bibasilar atelectasis or  edema is noted with small pleural effusions. Right upper lobe atelectasis or infiltrate is noted. Bony thorax is unremarkable. No pneumothorax is noted. IMPRESSION: Mild bibasilar atelectasis or edema is noted with small pleural effusions. Right upper lobe atelectasis or infiltrate is noted. Electronically Signed   By: Marijo Conception M.D.   On: 07/09/2022 08:23   ECHOCARDIOGRAM LIMITED  Result Date: 07/08/2022    ECHOCARDIOGRAM LIMITED REPORT   Patient Name:   [name] Date of Exam: 07/08/2022 Medical Rec #:  [id number]            Height:       72.0 in Accession #:    [id number]           Weight:       169.3 lb Date of Birth:  [birth year]             BSA:          1.985 m Patient Age:    15 years             BP:           106/62 mmHg Patient Gender: M                    HR:           72 bpm. Exam Location:  Inpatient Procedure: Limited Echo Indications:    mitral valve disorder  History:        Patient has prior history of Echocardiogram examinations, most                 recent 07/04/2022. CAD, Prior CABG; Risk Factors:Hypertension,                 Diabetes and Current Smoker.  Sonographer:    Johny Chess RDCS Referring Phys: [phone] [place]  1. Left ventricular ejection fraction, by estimation, is 35 to 40%. The left ventricle has moderately decreased function. The left ventricle demonstrates regional wall motion abnormalities (see scoring diagram/findings for description). There is mild left ventricular hypertrophy.  2. Right ventricular systolic function is normal. The right ventricular size is normal.  3. The mitral valve is normal in structure. Moderate to severe mitral valve regurgitation. No evidence of mitral stenosis.  4. The aortic valve is normal in structure. Aortic valve regurgitation is not visualized. No aortic stenosis is present.  5. The inferior vena cava is dilated in size with >50% respiratory variability, suggesting right atrial pressure of 8 mmHg.  Comparison(s): Prior images reviewed side by side. FINDINGS  Left Ventricle: Left ventricular ejection fraction, by estimation, is 35 to 40%. The left ventricle has moderately decreased function. The left ventricle demonstrates regional wall motion abnormalities. The left ventricular internal cavity size was normal in size. There is mild left ventricular hypertrophy.  LV Wall Scoring: The mid and distal anterior septum and mid anterior segment are akinetic. Right Ventricle: The right  ventricular size is normal. No increase in right ventricular wall thickness. Right ventricular systolic function is normal. Left Atrium: Left atrial size was normal in size. Right Atrium: Right atrial size was normal in size. Pericardium: There is no evidence of pericardial effusion. Mitral Valve: The mitral valve is normal in structure. Moderate to severe mitral valve regurgitation. No evidence of mitral valve stenosis. Tricuspid Valve: The tricuspid valve is normal in structure. Tricuspid valve regurgitation is mild . No evidence of tricuspid stenosis. Aortic Valve: The aortic valve is normal in structure. Aortic valve regurgitation is not visualized. No aortic stenosis is present. Pulmonic Valve: The pulmonic valve was normal in structure. Pulmonic valve regurgitation is not visualized. No evidence of pulmonic stenosis. Aorta: The aortic root is normal in size and structure. Venous: The inferior vena cava is dilated in size with greater than 50% respiratory variability, suggesting right atrial pressure of 8 mmHg. IAS/Shunts: No atrial level shunt detected by color flow Doppler. LEFT VENTRICLE PLAX 2D LVIDd:         4.80 cm      Diastology LVIDs:         4.10 cm      LV e' medial:  5.98 cm/s LV PW:         1.00 cm      LV e' lateral: 11.20 cm/s LV IVS:        1.20 cm LVOT diam:     2.20 cm LV SV:         68 LV SV Index:   34 LVOT Area:     3.80 cm  LV Volumes (MOD) LV vol d, MOD A2C: 154.0 ml LV vol s, MOD A2C: 86.5 ml LV SV MOD A2C:      67.5 ml AORTIC VALVE LVOT Vmax:   101.00 cm/s LVOT Vmean:  63.600 cm/s LVOT VTI:    0.178 m MR Peak grad: 72.2 mmHg MR Mean grad: 47.0 mmHg   SHUNTS MR Vmax:      425.00 cm/s Systemic VTI:  0.18 m MR Vmean:     327.0 cm/s  Systemic Diam: 2.20 cm Candee Furbish MD Electronically signed by Candee Furbish MD Signature Date/Time: 07/08/2022/2:55:28 PM    Final      Medications:     Scheduled Medications:  sodium chloride   Intravenous Once   acetaminophen  1,000 mg Oral Q6H   Or   acetaminophen (TYLENOL) oral liquid 160 mg/5 mL  1,000 mg Per Tube Q6H   amiodarone  400 mg Oral Daily   aspirin EC  81 mg Oral Daily   bisacodyl  10 mg Oral Daily   Or   bisacodyl  10 mg Rectal Daily   carvedilol  6.25 mg Oral BID WC   Chlorhexidine Gluconate Cloth  6 each Topical Q0600   clopidogrel  75 mg Oral Daily   docusate sodium  200 mg Oral Daily   empagliflozin  10 mg Oral Daily   feeding supplement  237 mL Oral BID BM   ferrous sulfate  325 mg Oral TID WC   furosemide  40 mg Oral BID   insulin aspart  0-24 Units Subcutaneous TID WC   insulin glargine-yfgn  25 Units Subcutaneous QHS   losartan  12.5 mg Oral Daily   metFORMIN  500 mg Oral BID WC   pantoprazole  40 mg Oral Daily   potassium chloride  20 mEq Oral Daily   rosuvastatin  40 mg Oral Daily   sodium chloride flush  3 mL Intravenous Q12H   spironolactone  12.5 mg Oral Daily    Infusions:  lactated ringers      PRN Medications: metoprolol tartrate, ondansetron (ZOFRAN) IV, oxyCODONE, sodium chloride flush, traMADol    Patient Profile   56 y/o AAM w/ h/o multivessel CAD s/p remote MI in 2012 and multiple PCIs to LAD, diag, ramus and OM vessels, HTN, HLD, Type 2 DM and tobacco use, admitted for NSTEMI and acute CHF. 2D Echo and TEE w/ severe mitral regurgitation. LVEF 40-45%, RV ok. Cath w/ multivessel CAD including high grade LM disease, elevated filling pressures and preserved CO. S/p CABG x 4 on 9/15.    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 No indication for MVR - 9/20 Repeat Echo EF 35-40%  mod-severe MR  2.  Acute on chronic systolic CHF/Ischemic cardiomyopathy - Echo with EF 35-40%   - LHC w/ MVCAD  - RHC w/ RA 8, PA 48/23 (38), PCWP mean 26, LVEDP 21,  PA sat 49% - Optimized w/ milrinone prior to CABG - S/p CABG x 4 on 07/04/22 - Volume status trending up. Stop po lasix. Give 40 mg IV lasix now.  - Continue spiro 12.5 mg daily  -Continue Carvedilol  - Continue  jardiance 10 mg daily - Stop losartan. Add entresto 24-26 mg twice a day.   - Renal function stable.   3. NSTEMI w/ Multivessel CAD  - Known hx prior CAD with MI in 2012 and prior multivessel PCI - HS trop peak 1,477 - LHC w/ severe MVCAD including high grade LM disease  - cMRI w/ viable myocardium  - s/p CABG x 4  - ASA + high intensity statin   4. Type 2DM  - per primary team  - A1c 8.2%  - Continue jardiance 10 mg daily.   5. IDA + EBLA  - IDA at baseline, required transfusion on admit - no obvious source blood loss, FOBT negative.  - Iron  21, sats 4%, treated w/ IV feraheme - GI consulted. EGD/ [facility]>>Non-obstructing Schatzki ring, hiatal hernia, normal stomach, 2 polyps (resected, path pending), diverticulosis, non bleeding hemorrhoids - VCE: no active bleeding, no AVMs, ulcers, mass. - Rec'd 2u RBCs 9/15.   - Given Feraheme 9/17  - hgb 6.2>7.7>7> 7.7   6. ETOH and tobacco abuse - cessation advised   Diurese today. Hopefully home tomorrow.  Will need meds sent to [facility] TOC.  HF F/U set up.   Length of Stay: [place], NP-C  07/09/2022, 10:52 AM  Advanced Heart Failure Team Pager [phone] (M-F; 7a - 5p)  Please contact [facility] for night-coverage after hours (5p -7a ) and weekends on [email or web address]  Patient  seen and examined with the above-signed Advanced Practice Provider and/or Housestaff. I personally reviewed laboratory data, imaging studies and relevant notes. I independently examined the patient and formulated the important aspects of the plan. I have edited the note to reflect any of my changes or salient points. I have personally discussed the plan with the patient and/or family.  Feels ok. Eager to go home. Denies CP or SOB. + edema   General:  Well appearing. No resp difficulty HEENT: normal Neck: supple. JVP to jaw Carotids 2+ bilat; no bruits. No lymphadenopathy or thryomegaly appreciated. Cor:  PMI nondisplaced. Regular rate & rhythm. No rubs, gallops or murmurs. Lungs: clear Abdomen: soft, nontender, nondistended. No hepatosplenomegaly. No bruits or masses. Good bowel sounds. Extremities: no cyanosis, clubbing, rash, 2+ edema Neuro: alert & orientedx3, cranial nerves grossly intact. moves all 4 extremities w/o difficulty. Affect pleasant  He is volume overloaded. Will keep one more day for IV lasix. Titrate GDMT. I have reviewed MR appears mild to moderate to me.   Glori Bickers, MD  5:13 PM

## 2022-07-10 ENCOUNTER — Other Ambulatory Visit (HOSPITAL_COMMUNITY): Payer: Self-pay

## 2022-07-10 LAB — BASIC METABOLIC PANEL
Anion gap: 9 (ref 5–15)
BUN: 9 mg/dL (ref 6–20)
CO2: 27 mmol/L (ref 22–32)
Calcium: 9.5 mg/dL (ref 8.9–10.3)
Chloride: 95 mmol/L — ABNORMAL LOW (ref 98–111)
Creatinine, Ser: 1.06 mg/dL (ref 0.61–1.24)
GFR, Estimated: >60 mL/min (ref >60)
Glucose, Bld: 148 mg/dL — ABNORMAL HIGH (ref 70–99)
Potassium: 3.6 mmol/L (ref 3.5–5.1)
Sodium: 131 mmol/L — ABNORMAL LOW (ref 135–145)

## 2022-07-10 LAB — GLUCOSE, CAPILLARY
Glucose-Capillary: 107 mg/dL — ABNORMAL HIGH (ref 70–99)
Glucose-Capillary: 110 mg/dL — ABNORMAL HIGH (ref 70–99)

## 2022-07-10 MED ORDER — CLOPIDOGREL BISULFATE 75 MG PO TABS: 75.0000 mg | ORAL_TABLET | Freq: Every day | ORAL | 0 refills | Status: DC

## 2022-07-10 MED ORDER — FUROSEMIDE 10 MG/ML IJ SOLN
80.0000 mg | Freq: Once | INTRAMUSCULAR | Status: AC
  Administered 2022-07-10: 80 mg via INTRAVENOUS
  Filled 2022-07-10: qty 8

## 2022-07-10 MED ORDER — CLOPIDOGREL BISULFATE 75 MG PO TABS
75.0000 mg | ORAL_TABLET | Freq: Every day | ORAL | 0 refills | Status: DC
  Filled 2022-07-10: qty 30, 30d supply, fill #0

## 2022-07-10 MED ORDER — POTASSIUM CHLORIDE CRYS ER 20 MEQ PO TBCR
20.0000 meq | EXTENDED_RELEASE_TABLET | Freq: Every day | ORAL | 0 refills | Status: DC
  Filled 2022-07-10: qty 30, 30d supply, fill #0

## 2022-07-10 MED ORDER — POTASSIUM CHLORIDE CRYS ER 20 MEQ PO TBCR: 20.0000 meq | EXTENDED_RELEASE_TABLET | Freq: Every day | ORAL | 0 refills | Status: DC

## 2022-07-10 MED ORDER — CARVEDILOL 6.25 MG PO TABS
6.2500 mg | ORAL_TABLET | Freq: Two times a day (BID) | ORAL | 0 refills | Status: DC
  Filled 2022-07-10: qty 30, 15d supply, fill #0

## 2022-07-10 MED ORDER — SPIRONOLACTONE 25 MG PO TABS
25.0000 mg | ORAL_TABLET | Freq: Every day | ORAL | 0 refills | Status: DC
  Filled 2022-07-10: qty 30, 30d supply, fill #0

## 2022-07-10 MED ORDER — POTASSIUM CHLORIDE CRYS ER 20 MEQ PO TBCR
40.0000 meq | EXTENDED_RELEASE_TABLET | ORAL | Status: AC
  Administered 2022-07-10 (×2): 40 meq via ORAL
  Filled 2022-07-10 (×2): qty 2

## 2022-07-10 MED ORDER — FUROSEMIDE 40 MG PO TABS
40.0000 mg | ORAL_TABLET | Freq: Every day | ORAL | 11 refills | Status: DC
  Filled 2022-07-10: qty 30, 30d supply, fill #0

## 2022-07-10 MED ORDER — OXYCODONE HCL 5 MG PO TABS
5.0000 mg | ORAL_TABLET | ORAL | 0 refills | Status: DC | PRN
  Filled 2022-07-10: qty 30, 5d supply, fill #0

## 2022-07-10 MED ORDER — SACUBITRIL-VALSARTAN 24-26 MG PO TABS
1.0000 | ORAL_TABLET | Freq: Two times a day (BID) | ORAL | 0 refills | Status: DC
  Filled 2022-07-10: qty 60, 30d supply, fill #0

## 2022-07-10 MED ORDER — ASPIRIN 81 MG PO TBEC: 81.0000 mg | DELAYED_RELEASE_TABLET | Freq: Every day | ORAL | 12 refills | Status: DC

## 2022-07-10 MED ORDER — SPIRONOLACTONE 25 MG PO TABS: 25.0000 mg | ORAL_TABLET | Freq: Every day | ORAL | 0 refills | Status: DC

## 2022-07-10 MED ORDER — SPIRONOLACTONE 25 MG PO TABS: 25.0000 mg | ORAL_TABLET | Freq: Every day | ORAL | Status: DC

## 2022-07-10 MED ORDER — ROSUVASTATIN CALCIUM 40 MG PO TABS
40.0000 mg | ORAL_TABLET | Freq: Every day | ORAL | 0 refills | Status: DC
  Filled 2022-07-10: qty 30, 30d supply, fill #0

## 2022-07-10 MED ORDER — ROSUVASTATIN CALCIUM 40 MG PO TABS: 40.0000 mg | ORAL_TABLET | Freq: Every day | ORAL | 0 refills | Status: DC

## 2022-07-10 MED FILL — Electrolyte-R (PH 7.4) Solution: INTRAVENOUS | Qty: 4000 | Status: AC

## 2022-07-10 MED FILL — Lidocaine HCl Local Preservative Free (PF) Inj 2%: INTRAMUSCULAR | Qty: 14 | Status: AC

## 2022-07-10 MED FILL — Sodium Bicarbonate IV Soln 8.4%: INTRAVENOUS | Qty: 50 | Status: AC

## 2022-07-10 MED FILL — Potassium Chloride Inj 2 mEq/ML: INTRAVENOUS | Qty: 20 | Status: AC

## 2022-07-10 MED FILL — Lidocaine HCl Local Soln Prefilled Syringe 100 MG/5ML (2%): INTRAMUSCULAR | Qty: 5 | Status: AC

## 2022-07-10 MED FILL — Calcium Chloride Inj 10%: INTRAVENOUS | Qty: 10 | Status: AC

## 2022-07-10 MED FILL — Albumin, Human Inj 5%: INTRAVENOUS | Qty: 250 | Status: AC

## 2022-07-10 MED FILL — Heparin Sodium (Porcine) Inj 1000 Unit/ML: INTRAMUSCULAR | Qty: 10 | Status: AC

## 2022-07-10 MED FILL — Mannitol IV Soln 20%: INTRAVENOUS | Qty: 500 | Status: AC

## 2022-07-10 MED FILL — Heparin Sodium (Porcine) Inj 1000 Unit/ML: INTRAMUSCULAR | Qty: 20 | Status: AC

## 2022-07-10 MED FILL — Sodium Chloride IV Soln 0.9%: INTRAVENOUS | Qty: 3000 | Status: AC

## 2022-07-10 NOTE — Progress Notes (Signed)
Mobility Specialist Progress Note:   07/10/22 1053  Mobility  Activity Ambulated with assistance in hallway  Level of Assistance Independent  Assistive Device None  Distance Ambulated (ft) 1000 ft  Activity Response Tolerated well  $Mobility charge 1 Mobility   Pt received in bed willing to participate in mobility. No complaints of pain. Left in chair with call bell in reach and all needs met.   [facility] Surveyor, mining Chat only

## 2022-07-10 NOTE — Plan of Care (Signed)
Problem: Education: Goal: Knowledge of General Education information will improve Description: Including pain rating scale, medication(s)/side effects and non-pharmacologic comfort measures Outcome: Adequate for Discharge   Problem: Health Behavior/Discharge Planning: Goal: Ability to manage health-related needs will improve Outcome: Adequate for Discharge   Problem: Clinical Measurements: Goal: Ability to maintain clinical measurements within normal limits will improve Outcome: Adequate for Discharge Goal: Will remain free from infection Outcome: Adequate for Discharge Goal: Diagnostic test results will improve Outcome: Adequate for Discharge Goal: Respiratory complications will improve Outcome: Adequate for Discharge Goal: Cardiovascular complication will be avoided Outcome: Adequate for Discharge   Problem: Activity: Goal: Risk for activity intolerance will decrease Outcome: Adequate for Discharge   Problem: Nutrition: Goal: Adequate nutrition will be maintained Outcome: Adequate for Discharge   Problem: Coping: Goal: Level of anxiety will decrease Outcome: Adequate for Discharge   Problem: Elimination: Goal: Will not experience complications related to bowel motility Outcome: Adequate for Discharge Goal: Will not experience complications related to urinary retention Outcome: Adequate for Discharge   Problem: Pain Managment: Goal: General experience of comfort will improve Outcome: Adequate for Discharge   Problem: Safety: Goal: Ability to remain free from injury will improve Outcome: Adequate for Discharge   Problem: Skin Integrity: Goal: Risk for impaired skin integrity will decrease Outcome: Adequate for Discharge   Problem: Education: Goal: Ability to describe self-care measures that may prevent or decrease complications (Diabetes Survival Skills Education) will improve Outcome: Adequate for Discharge Goal: Individualized Educational Video(s) Outcome:  Adequate for Discharge   Problem: Coping: Goal: Ability to adjust to condition or change in health will improve Outcome: Adequate for Discharge   Problem: Fluid Volume: Goal: Ability to maintain a balanced intake and output will improve Outcome: Adequate for Discharge   Problem: Health Behavior/Discharge Planning: Goal: Ability to identify and utilize available resources and services will improve Outcome: Adequate for Discharge Goal: Ability to manage health-related needs will improve Outcome: Adequate for Discharge   Problem: Metabolic: Goal: Ability to maintain appropriate glucose levels will improve Outcome: Adequate for Discharge   Problem: Nutritional: Goal: Maintenance of adequate nutrition will improve Outcome: Adequate for Discharge Goal: Progress toward achieving an optimal weight will improve Outcome: Adequate for Discharge   Problem: Skin Integrity: Goal: Risk for impaired skin integrity will decrease Outcome: Adequate for Discharge   Problem: Tissue Perfusion: Goal: Adequacy of tissue perfusion will improve Outcome: Adequate for Discharge   Problem: Education: Goal: Understanding of cardiac disease, CV risk reduction, and recovery process will improve Outcome: Adequate for Discharge Goal: Individualized Educational Video(s) Outcome: Adequate for Discharge   Problem: Activity: Goal: Ability to tolerate increased activity will improve Outcome: Adequate for Discharge   Problem: Cardiac: Goal: Ability to achieve and maintain adequate cardiovascular perfusion will improve Outcome: Adequate for Discharge   Problem: Health Behavior/Discharge Planning: Goal: Ability to safely manage health-related needs after discharge will improve Outcome: Adequate for Discharge   Problem: Education: Goal: Ability to demonstrate management of disease process will improve Outcome: Adequate for Discharge Goal: Ability to verbalize understanding of medication therapies will  improve Outcome: Adequate for Discharge Goal: Individualized Educational Video(s) Outcome: Adequate for Discharge   Problem: Activity: Goal: Capacity to carry out activities will improve Outcome: Adequate for Discharge   Problem: Cardiac: Goal: Ability to achieve and maintain adequate cardiopulmonary perfusion will improve Outcome: Adequate for Discharge   Problem: Education: Goal: Understanding of CV disease, CV risk reduction, and recovery process will improve Outcome: Adequate for Discharge Goal: Individualized Educational Video(s) Outcome: Adequate  for Discharge   Problem: Activity: Goal: Ability to return to baseline activity level will improve Outcome: Adequate for Discharge   Problem: Cardiovascular: Goal: Ability to achieve and maintain adequate cardiovascular perfusion will improve Outcome: Adequate for Discharge Goal: Vascular access site(s) Level 0-1 will be maintained Outcome: Adequate for Discharge   Problem: Health Behavior/Discharge Planning: Goal: Ability to safely manage health-related needs after discharge will improve Outcome: Adequate for Discharge   Problem: Education: Goal: Will demonstrate proper wound care and an understanding of methods to prevent future damage Outcome: Adequate for Discharge Goal: Knowledge of disease or condition will improve Outcome: Adequate for Discharge Goal: Knowledge of the prescribed therapeutic regimen will improve Outcome: Adequate for Discharge Goal: Individualized Educational Video(s) Outcome: Adequate for Discharge   Problem: Activity: Goal: Risk for activity intolerance will decrease Outcome: Adequate for Discharge   Problem: Cardiac: Goal: Will achieve and/or maintain hemodynamic stability Outcome: Adequate for Discharge   Problem: Clinical Measurements: Goal: Postoperative complications will be avoided or minimized Outcome: Adequate for Discharge   Problem: Respiratory: Goal: Respiratory status will  improve Outcome: Adequate for Discharge   Problem: Skin Integrity: Goal: Wound healing without signs and symptoms of infection Outcome: Adequate for Discharge Goal: Risk for impaired skin integrity will decrease Outcome: Adequate for Discharge   Problem: Urinary Elimination: Goal: Ability to achieve and maintain adequate renal perfusion and functioning will improve Outcome: Adequate for Discharge

## 2022-07-10 NOTE — Progress Notes (Signed)
While preparing this patient for discharge This RN found that this patient still had pacer wires in place. Informed Primary RN Aaron Edelman. He stated that he will remove and then call for discharge. Will transfer care back to Primary RN.

## 2022-07-10 NOTE — Progress Notes (Signed)
Patient D/C with TOC medication, transported by family

## 2022-07-10 NOTE — Progress Notes (Addendum)
      [address]       [place] [address]             [phone]      6 Days Post-Op Procedure(s) (LRB): CORONARY ARTERY BYPASS GRAFTING (CABG) X4, USING LEFT GREATER SAPHENOUS VEIN. (N/A) TRANSESOPHAGEAL ECHOCARDIOGRAM (TEE) (N/A)  Subjective:  Patient sitting up in chair eating breakfast.  Hoping to go home today.  States he voided a lot yesterday.   + ambulation   + BM  Objective: Vital signs in last 24 hours: Temp:  [98.1 F (36.7 C)-99.2 F (37.3 C)] 99.1 F (37.3 C) (09/21 0357) Pulse Rate:  [71-85] 83 (09/21 0357) Cardiac Rhythm: Normal sinus rhythm;Bundle branch block (09/20 1900) Resp:  [18-20] 19 (09/21 0357) BP: (113-127)/(75-82) 127/81 (09/21 0357) SpO2:  [96 %-100 %] 99 % (09/21 0357) Weight:  [74.3 kg] 74.3 kg (09/21 0357)  Intake/Output from previous day: 09/20 0701 - 09/21 0700 In: 240 [P.O.:240] Out: 4300 [Urine:4300]  General appearance: alert, cooperative, and no distress Heart: regular rate and rhythm Lungs: clear to auscultation bilaterally Abdomen: soft, non-tender; bowel sounds normal; no masses,  no organomegaly Extremities: edema 2+ pitting Wound: clean and dry  Lab Results: Recent Labs    07/08/22 0553 07/09/22 0323  WBC 13.7* 15.0*  HGB 7.7* 7.7*  HCT 24.9* 24.9*  PLT 461* 536*   BMET:  Recent Labs    07/08/22 0553 07/09/22 0323  NA 132* 133*  K 4.8 3.9  CL 97* 97*  CO2 29 26  GLUCOSE 232* 125*  BUN 8 8  CREATININE 0.97 1.00  CALCIUM 9.2 9.5    PT/INR: No results for input(s): "LABPROT", "INR" in the last 72 hours. ABG    Component Value Date/Time   PHART 7.374 07/04/2022 1931   HCO3 25.9 07/04/2022 1931   TCO2 27 07/04/2022 1931   O2SAT 54 07/06/2022 0400   CBG (last 3)  Recent Labs    07/09/22 1622 07/09/22 2113 07/10/22 0608  GLUCAP 225* 107* 107*    Assessment/Plan: S/P Procedure(s) (LRB): CORONARY ARTERY BYPASS GRAFTING (CABG) X4, USING LEFT GREATER SAPHENOUS VEIN.  (N/A) TRANSESOPHAGEAL ECHOCARDIOGRAM (TEE) (N/A)  CV- NSR, BP stable- continue Amiodarone, Coreg, Entresto, Aldactone, Plavix Pulm- off oxygen, continue IS Renal- remains edematous on exam, diuresed 4L yesterday, will defer further diuretics to AHF, TED hose ordered yesterday, will discuss with nursing why these were not placed Chronic anemia- continue iron DM-sugars controlled, continue current insulin regimen Dispo- patient stable, can hopefully d/c home today, will await AHF recs   LOS: 16 days    Ellwood Handler, PA-C 07/10/2022  Agree with above. If ok with AHF team would DC today with daily diuretics May stop amiodorone

## 2022-07-10 NOTE — Progress Notes (Signed)
Pacer wires and sutures removed on patient

## 2022-07-10 NOTE — TOC Initial Note (Signed)
Transition of Care [facility]) - Initial/Assessment Note    Patient Details  Name: [name] MRN: [id number] Date of Birth: 09/01/1966  Transition [facility]) CM/SW Contact:    Erenest Rasher, RN Phone Number: [phone] 07/10/2022, 5:20 PM  Clinical Narrative:                 HF TOC CM spoke to pt and SO at bedside. Pt reports having a scale at home. Discussed with pt low sodium diet. Meds up from [place]. Reviewed with pt appt for HF clinic.   Expected Discharge Plan: Home/Self Care Barriers to Discharge: No Barriers Identified   Patient Goals and CMS Choice Patient states their goals for this hospitalization and ongoing recovery are:: wants to remain independent CMS Medicare.gov Compare Post Acute Care list provided to:: Patient    Expected Discharge Plan and Services Expected Discharge Plan: Home/Self Care   Discharge Planning Services: CM Consult   Living arrangements for the past 2 months: Apartment Expected Discharge Date: 07/10/22                                    Prior Living Arrangements/Services Living arrangements for the past 2 months: Apartment Lives with:: Significant Other Patient language and need for interpreter reviewed:: Yes Do you feel safe going back to the place where you live?: Yes      Need for Family Participation in Patient Care: No (Comment) Care giver support system in place?: Yes (comment)   Criminal Activity/Legal Involvement Pertinent to Current Situation/Hospitalization: No - Comment as needed  Activities of Daily Living Home Assistive Devices/Equipment: Eyeglasses, CBG Meter, CPAP ADL Screening (condition at time of admission) Patient's cognitive ability adequate to safely complete daily activities?: Yes Is the patient deaf or have difficulty hearing?: No Does the patient have difficulty seeing, even when wearing glasses/contacts?: No Does the patient have difficulty concentrating, remembering, or making  decisions?: Yes Patient able to express need for assistance with ADLs?: Yes Does the patient have difficulty dressing or bathing?: No Independently performs ADLs?: Yes (appropriate for developmental age) Does the patient have difficulty walking or climbing stairs?: No Weakness of Legs: None Weakness of Arms/Hands: None  Permission Sought/Granted Permission sought to share information with : Case Manager, Family Supports, PCP Permission granted to share information with : Yes, Verbal Permission Granted  Share Information with NAME: Lucinda Dell     Permission granted to share info w Relationship: Significant Other  Permission granted to share info w Contact Information: [phone]  Emotional Assessment Appearance:: Appears stated age Attitude/Demeanor/Rapport: Engaged Affect (typically observed): Accepting Orientation: : Oriented to Self, Oriented to Place, Oriented to  Time, Oriented to Situation   Psych Involvement: No (comment)  Admission diagnosis:  Elevated troponin [R77.8] Chest pain [R07.9] Nonspecific chest pain [R07.9] Anemia, unspecified type [D64.9] Acute congestive heart failure, unspecified heart failure type ([place]) [I50.9] S/P CABG x 4 [Z95.1] Patient Active Problem List   Diagnosis Date Noted   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   Hiatal hernia    Adenomatous polyp of ascending colon    Adenomatous polyp of transverse colon    Grade I internal hemorrhoids    Diverticulosis of colon without hemorrhage    Folate deficiency 06/30/2022   Acute congestive heart failure ([place]) 06/30/2022   Elevated troponin 06/30/2022   Left upper lobe pneumonia 06/27/2022   Iron deficiency anemia 06/26/2022   Acute  systolic heart failure (HCC)    Localized swelling of right lower extremity 06/24/2022   HLD (hyperlipidemia) 06/24/2022   Microcytic anemia 06/24/2022   Hypomagnesemia 06/24/2022   Acute pancreatitis 04/04/2020   Forearm fracture, right, closed, initial encounter 12/31/2016    Hypokalemia 12/31/2016   Closed displaced comminuted fracture of shaft of right radius    Fall at home, initial encounter    Hyponatremia 08/22/2015   GERD (gastroesophageal reflux disease) 08/22/2015   Tobacco abuse 08/22/2015   ETOH abuse 08/22/2015   Chest pain at rest 08/22/2015   Type 2 diabetes mellitus with hyperlipidemia ([place])    Pancreatitis 01/05/2013   CAD (coronary artery disease) 01/05/2013   Hypertension 01/05/2013   Hyperglycemia 01/05/2013   Elevated LFTs 01/05/2013   PCP:  Charolette Forward, MD Pharmacy:   [id number]: [phone]: [id number]: [phone]: [phone]    Social Determinants of Health (SDOH) Interventions    Readmission Risk Interventions     No data to display

## 2022-07-10 NOTE — Progress Notes (Signed)
Discharge instructions (including medications) discussed with and copy provided to patient/caregiver 

## 2022-07-10 NOTE — Progress Notes (Addendum)
Patient ID: [name], male   DOB: 10-29-1965, 56 y.o.   MRN: [id number]   Advanced Heart Failure Rounding Note  PCP-Cardiologist: None   Subjective:    09/06: Started milrinone 0.125 to facilitate diuresis in setting of low-output 09/07: Milrinone increased to 0.25. 09/09: Milrinone decreased to 0.125  09/11: 1uPRBc  9/12: EGD/ [facility]>>Non-obstructing Schatzki ring, hiatal hernia, normal stomach, 2 polyps (resected, path pending), diverticulosis, non bleeding hemorrhoids. VCE pending . 9/13: VCE- no active bleeding, no AVMs, ulcers, mass. 9/14: cMRI EF 29% with viable myocardium. Mild to moderate MR  9/15: s/p CABG x4 (LIMA-LAD RSVG from aorta to RPDA (acute marginal extension), RSVG from aorta to Ramus sequenced to the distal OM. Perioperative TEE showed only mild-mod MR. No indication for MVR.  9/17 - GivenFeraheme. Milrinone stopped.  9/18 Started on spiro.  9/19 Started jardiance.   POD #6  Good UOP yesterday w/ IV Lasix, 4.3L. Wt down 6 lb, though still slightly above pre-op wt by ~ 7 lb. SCr 1.06, K 3.6.   OOB, sitting up in chair eating breakfast. Says he feels "great". Denies dyspnea. Ambulating w/o difficulty. Continues w/ b/l LEE on exam.   Tolerating GDMT ok. BP stable. No orthostatic symptoms.   Objective:   Weight Range: 74.3 kg Body mass index is 22.22 kg/m.   Vital Signs:   Temp:  [98.1 F (36.7 C)-99.2 F (37.3 C)] 98.8 F (37.1 C) (09/21 0700) Pulse Rate:  [71-85] 84 (09/21 0700) Resp:  [18-20] 19 (09/21 0700) BP: (113-127)/(75-82) 121/75 (09/21 0700) SpO2:  [96 %-100 %] 97 % (09/21 0700) Weight:  [74.3 kg] 74.3 kg (09/21 0357) Last BM Date : 07/08/22  Weight change: Filed Weights   07/07/22 0200 07/08/22 0500 07/10/22 0357  Weight: 75.3 kg 76.8 kg 74.3 kg    Intake/Output:   Intake/Output Summary (Last 24 hours) at 07/10/2022 0939 Last data filed at 07/10/2022 0700 Gross per 24 hour  Intake 240 ml  Output 4100 ml  Net -3860 ml      Physical Exam   General:  Well appearing. No respiratory difficulty HEENT: normal Neck: supple. JVD 8 cm. Carotids 2+ bilat; no bruits. No lymphadenopathy or thyromegaly appreciated. Cor: PMI nondisplaced. Regular rate & rhythm. No rubs, gallops or murmurs. Sternotomy site ok  Lungs: clear Abdomen: soft, nontender, nondistended. No hepatosplenomegaly. No bruits or masses. Good bowel sounds. Extremities: no cyanosis, clubbing, rash, 1+ b/l LE edema Neuro: alert & oriented x 3, cranial nerves grossly intact. moves all 4 extremities w/o difficulty. Affect pleasant.   Telemetry   NSR 90s personally checked.   Labs    CBC Recent Labs    07/08/22 0553 07/09/22 0323  WBC 13.7* 15.0*  HGB 7.7* 7.7*  HCT 24.9* 24.9*  MCV 83.8 83.8  PLT 461* 119*   Basic Metabolic Panel Recent Labs    07/09/22 0323 07/10/22 0804  NA 133* 131*  K 3.9 3.6  CL 97* 95*  CO2 26 27  GLUCOSE 125* 148*  BUN 8 9  CREATININE 1.00 1.06  CALCIUM 9.5 9.5   Liver Function Tests Recent Labs    07/08/22 0553  AST 21  ALT 20  ALKPHOS 76  BILITOT 0.7  PROT 6.9  ALBUMIN 2.6*    No results for input(s): "LIPASE", "AMYLASE" in the last 72 hours. Cardiac Enzymes No results for input(s): "CKTOTAL", "CKMB", "CKMBINDEX", "TROPONINI" in the last 72 hours.  BNP: BNP (last 3 results) Recent Labs    06/24/22 0809 06/25/22 0133  BNP 639.0* 830.5*    ProBNP (last 3 results) No results for input(s): "PROBNP" in the last 8760 hours.   D-Dimer No results for input(s): "DDIMER" in the last 72 hours. Hemoglobin A1C No results for input(s): "HGBA1C" in the last 72 hours.  Fasting Lipid Panel No results for input(s): "CHOL", "HDL", "LDLCALC", "TRIG", "CHOLHDL", "LDLDIRECT" in the last 72 hours.  Thyroid Function Tests No results for input(s): "TSH", "T4TOTAL", "T3FREE", "THYROIDAB" in the last 72 hours.  Invalid input(s): "FREET3"  Other results:   Imaging    No results  found.   Medications:     Scheduled Medications:  sodium chloride   Intravenous Once   amiodarone  400 mg Oral Daily   aspirin EC  81 mg Oral Daily   bisacodyl  10 mg Oral Daily   Or   bisacodyl  10 mg Rectal Daily   carvedilol  6.25 mg Oral BID WC   clopidogrel  75 mg Oral Daily   docusate sodium  200 mg Oral Daily   empagliflozin  10 mg Oral Daily   feeding supplement  237 mL Oral BID BM   ferrous sulfate  325 mg Oral TID WC   furosemide  80 mg Intravenous Once   insulin aspart  0-24 Units Subcutaneous TID WC   insulin glargine-yfgn  25 Units Subcutaneous QHS   metFORMIN  500 mg Oral BID WC   pantoprazole  40 mg Oral Daily   potassium chloride  20 mEq Oral Daily   rosuvastatin  40 mg Oral Daily   sacubitril-valsartan  1 tablet Oral BID   sodium chloride flush  3 mL Intravenous Q12H   [START ON 07/11/2022] spironolactone  25 mg Oral Daily    Infusions:  lactated ringers      PRN Medications: metoprolol tartrate, ondansetron (ZOFRAN) IV, oxyCODONE, sodium chloride flush, traMADol    Patient Profile   56 y/o AAM w/ h/o multivessel CAD s/p remote MI in 2012 and multiple PCIs to LAD, diag, ramus and OM vessels, HTN, HLD, Type 2 DM and tobacco use, admitted for NSTEMI and acute CHF. 2D Echo and TEE w/ severe mitral regurgitation. LVEF 40-45%, RV ok. Cath w/ multivessel CAD including high grade LM disease, elevated filling pressures and preserved CO. S/p CABG x 4 on 9/15.    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 No indication for MVR - 9/20 Repeat Echo EF 35-40%  mild-mod MR  2.  Acute on chronic systolic CHF/Ischemic cardiomyopathy - Echo with EF 35-40%   - LHC w/ MVCAD  - RHC w/ RA 8, PA 48/23 (38), PCWP mean 26, LVEDP 21,  PA sat 49% - Optimized w/  milrinone prior to CABG - S/p CABG x 4 on 07/04/22 - C/w mild volume overload on exam - Give 80 mg IV Lasix x 1 + aggressive K supp   - Increase Spironolactone to 25 mg daily  - Continue Carvedilol 6.25 mg bid  - Continue  Jardiance 10 mg daily - Continue Entresto 24-26 mg twice a day.     3. NSTEMI w/ Multivessel CAD  - Known hx prior CAD with MI in 2012 and prior multivessel PCI - HS trop peak 1,477 - LHC w/ severe MVCAD including high grade LM disease  - cMRI w/ viable  myocardium  - s/p CABG x 4  - ASA + high intensity statin   4. Type 2DM  - per primary team  - A1c 8.2%  - Continue Jardiance 10 mg daily.   5. IDA + EBLA  - IDA at baseline, required transfusion on admit - no obvious source blood loss, FOBT negative.  - Iron  21, sats 4%, treated w/ IV feraheme - GI consulted. EGD/ [facility]>>Non-obstructing Schatzki ring, hiatal hernia, normal stomach, 2 polyps (resected, path pending), diverticulosis, non bleeding hemorrhoids - VCE: no active bleeding, no AVMs, ulcers, mass. - Rec'd 2u RBCs 9/15.   - Given Feraheme 9/17  - hgb 6.2>7.7>7> 7.7   6. ETOH and tobacco abuse - cessation advised   Awaiting BMP. If renal fx/K stable, can likely d/c home later this afternoon after dose of IV Lasix.   Cardiac Meds for Discharge  ASA 81 mg daily  Coreg 6.25 mg bid  Plavix 75 mg daily  Jardiance 10 mg daily  Entresto 24-26 mg bid Spironolactone 25 mg daily  Lasix 40 mg daily  KCl 20 mEq daily  Crestor 40 mg daily   ?Continuation of amiodarone per CT surgery   He has f/u in the [facility] on 9/26. Appt info in AVS.   He gets meds from [place]. Will need 30 day Rx for meds sent to [place] and hard copies of Scripts printed for him to take to [place].    Length of Stay: [address] PA-C 07/10/2022, 9:39 AM  Advanced Heart Failure Team Pager [phone] (M-F; 7a - 5p)  Please contact [facility] for night-coverage after hours (5p -7a ) and weekends on  [email or web address]   Patient seen and examined with the above-signed Advanced Practice Provider and/or Housestaff. I personally reviewed laboratory data, imaging studies and relevant notes. I independently examined the patient and formulated the important aspects of the plan. I have edited the note to reflect any of my changes or salient points. I have personally discussed the plan with the patient and/or family.  Denies CP or SOB. Volume status improved with IV diuresis but still overloaded. Wants to go home   General:  Well appearing. No resp difficulty HEENT: normal Neck: supple. JVP 7. Carotids 2+ bilat; no bruits. No lymphadenopathy or thryomegaly appreciated. Cor: PMI nondisplaced. Regular rate & rhythm. No rubs, gallops or murmurs. Lungs: clear Abdomen: soft, nontender, nondistended. No hepatosplenomegaly. No bruits or masses. Good bowel sounds. Extremities: no cyanosis, clubbing, rash, 1+ edema Neuro: alert & orientedx3, cranial nerves grossly intact. moves all 4 extremities w/o difficulty. Affect pleasant  Improved but still overloaded. We discussed possibly staying one more day but he wants to go home. Will give one more dose IV lasix and supp K this am then I think he can go home this afternoon.   Meds d/w PharmD team personally.   Glori Bickers, MD  4:31 PM

## 2022-07-14 ENCOUNTER — Ambulatory Visit: Payer: No Typology Code available for payment source

## 2022-07-15 ENCOUNTER — Ambulatory Visit (HOSPITAL_COMMUNITY)
Admit: 2022-07-15 | Discharge: 2022-07-15 | Disposition: A | Discharge status: Home / Self Care | Payer: No Typology Code available for payment source | Source: Ambulatory Visit | Attending: Family Medicine | Admitting: Family Medicine

## 2022-07-15 VITALS — BP 106/74 | HR 83 | Wt 154.0 lb

## 2022-07-15 DIAGNOSIS — E785 Hyperlipidemia, unspecified: Secondary | ICD-10-CM | POA: Insufficient documentation

## 2022-07-15 DIAGNOSIS — Z7982 Long term (current) use of aspirin: Secondary | ICD-10-CM | POA: Insufficient documentation

## 2022-07-15 DIAGNOSIS — I340 Nonrheumatic mitral (valve) insufficiency: Secondary | ICD-10-CM | POA: Diagnosis not present

## 2022-07-15 DIAGNOSIS — K449 Diaphragmatic hernia without obstruction or gangrene: Secondary | ICD-10-CM | POA: Insufficient documentation

## 2022-07-15 DIAGNOSIS — I252 Old myocardial infarction: Secondary | ICD-10-CM | POA: Insufficient documentation

## 2022-07-15 DIAGNOSIS — E119 Type 2 diabetes mellitus without complications: Secondary | ICD-10-CM | POA: Diagnosis not present

## 2022-07-15 DIAGNOSIS — Z7901 Long term (current) use of anticoagulants: Secondary | ICD-10-CM | POA: Insufficient documentation

## 2022-07-15 DIAGNOSIS — Z794 Long term (current) use of insulin: Secondary | ICD-10-CM | POA: Insufficient documentation

## 2022-07-15 DIAGNOSIS — F17210 Nicotine dependence, cigarettes, uncomplicated: Secondary | ICD-10-CM | POA: Insufficient documentation

## 2022-07-15 DIAGNOSIS — R5383 Other fatigue: Secondary | ICD-10-CM

## 2022-07-15 DIAGNOSIS — Z720 Tobacco use: Secondary | ICD-10-CM

## 2022-07-15 DIAGNOSIS — I255 Ischemic cardiomyopathy: Secondary | ICD-10-CM | POA: Insufficient documentation

## 2022-07-15 DIAGNOSIS — I5022 Chronic systolic (congestive) heart failure: Secondary | ICD-10-CM | POA: Insufficient documentation

## 2022-07-15 DIAGNOSIS — I110 Hypertensive heart disease with heart failure: Secondary | ICD-10-CM | POA: Insufficient documentation

## 2022-07-15 DIAGNOSIS — Z7984 Long term (current) use of oral hypoglycemic drugs: Secondary | ICD-10-CM | POA: Insufficient documentation

## 2022-07-15 DIAGNOSIS — Z79899 Other long term (current) drug therapy: Secondary | ICD-10-CM | POA: Insufficient documentation

## 2022-07-15 DIAGNOSIS — D649 Anemia, unspecified: Secondary | ICD-10-CM

## 2022-07-15 DIAGNOSIS — I2510 Atherosclerotic heart disease of native coronary artery without angina pectoris: Secondary | ICD-10-CM | POA: Insufficient documentation

## 2022-07-15 DIAGNOSIS — Z716 Tobacco abuse counseling: Secondary | ICD-10-CM | POA: Insufficient documentation

## 2022-07-15 DIAGNOSIS — Z951 Presence of aortocoronary bypass graft: Secondary | ICD-10-CM | POA: Insufficient documentation

## 2022-07-15 DIAGNOSIS — F1090 Alcohol use, unspecified, uncomplicated: Secondary | ICD-10-CM | POA: Insufficient documentation

## 2022-07-15 LAB — CBC
HCT: 30.9 % — ABNORMAL LOW (ref 39.0–52.0)
Hemoglobin: 9.9 g/dL — ABNORMAL LOW (ref 13.0–17.0)
MCH: 27.4 pg (ref 26.0–34.0)
MCHC: 32 g/dL (ref 30.0–36.0)
MCV: 85.6 fL (ref 80.0–100.0)
Platelets: 652 10*3/uL — ABNORMAL HIGH (ref 150–400)
RBC: 3.61 MIL/uL — ABNORMAL LOW (ref 4.22–5.81)
RDW: 23.1 % — ABNORMAL HIGH (ref 11.5–15.5)
WBC: 13.1 10*3/uL — ABNORMAL HIGH (ref 4.0–10.5)
nRBC: 0 % (ref 0.0–0.2)

## 2022-07-15 LAB — BASIC METABOLIC PANEL
Anion gap: 13 (ref 5–15)
BUN: 11 mg/dL (ref 6–20)
CO2: 24 mmol/L (ref 22–32)
Calcium: 9.9 mg/dL (ref 8.9–10.3)
Chloride: 95 mmol/L — ABNORMAL LOW (ref 98–111)
Creatinine, Ser: 1 mg/dL (ref 0.61–1.24)
GFR, Estimated: >60 mL/min (ref >60)
Glucose, Bld: 76 mg/dL (ref 70–99)
Potassium: 4.3 mmol/L (ref 3.5–5.1)
Sodium: 132 mmol/L — ABNORMAL LOW (ref 135–145)

## 2022-07-15 NOTE — Progress Notes (Signed)
ADVANCED HF CLINIC CONSULT NOTE  PCP: [place] Primary Cardiologist: Charolette Forward, MD HF Cardiologist: Dr. Haroldine Laws  HPI: [name] is a 56 y.o. AAM w/ h/o multivessel CAD s/p remote MI in 2012 and multiple PCIs to LAD, diag, ramus and OM vessels, HTN, HLD, Type 2 DM and tobacco/ETOH use. Previously followed by Dr. Terrence Dupont but lost to f/u.    Admitted 9/23 with NSTEMI. Echo showed EF 40-45%, severe mitral valve dysfunction w/ severe leaflet malcoaptation due to posterior leaflet tethering, severe MR and severely elevated RVSP 61 mmHg, RV normal. TEE confirmed mod-severe MR w/ tethered posterior leaflet, felt likely ischemic MR. TEE w/ + WMAs of mid to distal anterior and apical walls, anterolateral walls and basal to mid inferior and inferolateral walls, suggestive of multivessel territory ischemia/infarct. Underwent R/LHC showing 75% stenosis of m-dLM, 90% Ost- Prox Cx, 50% pRCA and 80% mRCA; elevated wedge (26), preserved CO (Fick) 5.4. Milrinone started to lower PA pressures. AHF consulted to help manage. GI consulted due to bleeding, and underwent EGD/Colonoscopy showing hiatial hernia and 2 polyps resected, diverticulosis; VCE showing no active bleeding/AVMs. He had cMRI showing LVEF 29% with viable myocardium, and mild to moderate MR, RVEF 61%. Underwent CABG x 4 (LIMA-LAD RSVG from aorta to RPDA (acute marginal extension), RSVG from aorta to Ramus sequenced to the distal OM. Perioperative TEE showed only mild-mod MR. No indication for MVR. Drips eventually weaned off and GDMT started. He was discharged home, weight 160 lbs.   Today he returns for post hospital HF follow up with his wife. Overall feeling fine. He has incisional chest soreness and has lifting restrictions, but no dyspnea with ADLs or walking on flat ground. Denies palpitations, abnormal bleeding, dizziness, edema, or PND/Orthopnea. Appetite ok. No fever or chills. Weight at home 160 pounds. Taking all medications. Smokes 4-5  cigs/day, 4-5 ETOH drinks/week, no illicit drugs. [facility].  Cardiac Studies   - TEE (06/25/22): LVEF 40-45%, no PFO, no LAA thrombus, severe LAE, moderate to severe MR with a centro/posteriorly directed jet, likely due to ischemic tethering of the posterior leaflet and dilated annulus in the setting of severe LAE  - cMRI (9/23): LVEF 29% with viable myocardium, RVEF 61%, mild to moderate MR  - Echo (9/23): EF 40-45%, grade III DD, RV OK, severe  MR with evere leaflet malcoaptation due to posterior leaflet tethering.   - R/LHC (06/25/22): severe MR with 34 mm V waves in wedge tracing, elevated PCWP (mean 26), low PA oxygen sat (49%), preserved CO (Fick) 5.4    Mid LM to Dist LM lesion is 75% stenosed.   Ost Cx to Prox Cx lesion is 90% stenosed.   Mid RCA lesion is 80% stenosed.   Prox RCA lesion is 50% stenosed.   Mid LAD lesion is 75% stenosed.   Mid Cx to Dist Cx lesion is 99% stenosed.   3rd Mrg lesion is 90% stenosed.   Ramus lesion is 80% stenosed.   Dist LAD lesion is 70% stenosed.   1. Advanced multivessel cor Neri artery disease with severe distal left main disease, very severe ostial circumflex stenosis, subtotal occlusion of the mid circumflex with occlusion of the OM branches, severe in-stent restenosis in the mid LAD, diffuse distal LAD disease, and severe mid RCA stenosis  Review of Systems: [y] = yes, '[ ]'$  = no   General: Weight gain '[ ]'$ ; Weight loss '[ ]'$ ; Anorexia '[ ]'$ ;  Fatigue Blue.Reese ]; Fever '[ ]'$ ; Chills '[ ]'$ ; Weakness '[ ]'$   Cardiac: Chest pain/pressure '[ ]'$ ; Resting SOB '[ ]'$ ; Exertional SOB '[ ]'$ ; Orthopnea '[ ]'$ ; Pedal Edema '[ ]'$ ; Palpitations '[ ]'$ ; Syncope '[ ]'$ ; Presyncope '[ ]'$ ; Paroxysmal nocturnal dyspnea'[ ]'$   Pulmonary: Cough '[ ]'$ ; Wheezing'[ ]'$ ; Hemoptysis'[ ]'$ ; Sputum '[ ]'$ ; Snoring '[ ]'$   GI: Vomiting'[ ]'$ ; Dysphagia'[ ]'$ ; Melena'[ ]'$ ; Hematochezia '[ ]'$ ; Heartburn'[ ]'$ ; Abdominal pain '[ ]'$ ; Constipation '[ ]'$ ; Diarrhea '[ ]'$ ; BRBPR '[ ]'$    GU: Hematuria'[ ]'$ ; Dysuria '[ ]'$ ; Nocturia'[ ]'$   Vascular: Pain in legs with walking '[ ]'$ ; Pain in feet with lying flat '[ ]'$ ; Non-healing sores '[ ]'$ ; Stroke '[ ]'$ ; TIA '[ ]'$ ; Slurred speech '[ ]'$ ;  Neuro: Headaches'[ ]'$ ; Vertigo'[ ]'$ ; Seizures'[ ]'$ ; Paresthesias'[ ]'$ ;Blurred vision '[ ]'$ ; Diplopia '[ ]'$ ; Vision changes '[ ]'$   Ortho/Skin: Arthritis '[ ]'$ ; Joint pain '[ ]'$ ; Muscle pain '[ ]'$ ; Joint swelling '[ ]'$ ; Back Pain '[ ]'$ ; Rash '[ ]'$   Psych: Depression'[ ]'$ ; Anxiety'[ ]'$   Heme: Bleeding problems '[ ]'$ ; Clotting disorders '[ ]'$ ; Anemia Blue.Reese ]  Endocrine: Diabetes Blue.Reese ]; Thyroid dysfunction'[ ]'$   Past Medical History:  Diagnosis Date   Acid reflux    Concussion    Coronary artery disease    Diabetes (HCC)    ETOH abuse    High cholesterol    Hypertension    MI (myocardial infarction) ([place])    2012   Sleep apnea    USES CPAP AS NEEDED   Tobacco abuse    Current Outpatient Medications  Medication Sig Dispense Refill   aspirin EC 81 MG tablet Take 1 tablet (81 mg total) by mouth daily. Swallow whole. 30 tablet 12   Calcium Polycarbophil (FIBER) 625 MG TABS Take 2 tablets by mouth daily.     carvedilol (COREG) 6.25 MG tablet Take 1 tablet (6.25 mg total) by mouth 2 (two) times daily with a meal. 30 tablet 0   clopidogrel (PLAVIX) 75 MG tablet Take 1 tablet (75 mg total) by mouth daily. 30 tablet 0   empagliflozin (JARDIANCE) 25 MG TABS tablet Take 0.5 tablets by mouth daily.     furosemide (LASIX) 40 MG tablet Take 1 tablet (40 mg total) by mouth daily. 30 tablet 11   glipiZIDE (GLUCOTROL) 5 MG tablet Take 5 mg by mouth daily. 30 minutes prior to a meal     insulin glargine (LANTUS) 100 UNIT/ML Solostar Pen Inject 50 Units into the skin at bedtime.     metFORMIN (GLUCOPHAGE-XR) 500 MG 24 hr tablet Take 500 mg by mouth 2 (two) times daily with a meal.     ondansetron (ZOFRAN) 8 MG tablet Take 4 mg by mouth 3 (three) times daily as needed for nausea.     oxyCODONE (OXY IR/ROXICODONE) 5 MG immediate release tablet Take 1 tablet (5 mg  total) by mouth every 4 (four) hours as needed for severe pain. 30 tablet 0   potassium chloride SA (KLOR-CON M) 20 MEQ tablet Take 1 tablet (20 mEq total) by mouth daily. 30 tablet 0   rosuvastatin (CRESTOR) 40 MG tablet Take 1 tablet (40 mg total) by mouth daily. 30 tablet 0   sacubitril-valsartan (ENTRESTO) 24-26 MG Take 1 tablet by mouth 2 (two) times daily. 60 tablet 0   sildenafil (VIAGRA) 100 MG tablet Take 50 mg by mouth daily as needed for erectile dysfunction.     spironolactone (ALDACTONE) 25 MG tablet Take 1 tablet (25 mg total)  by mouth daily. 30 tablet 0   Vitamin D, Ergocalciferol, (DRISDOL) 1.25 MG (50000 UNIT) CAPS capsule Take 50,000 Units by mouth every 7 (seven) days.     No current facility-administered medications for this encounter.   No Known Allergies  Social History   Socioeconomic History   Marital status: Divorced    Spouse name: Not on file   Number of children: Not on file   Years of education: Not on file   Highest education level: Not on file  Occupational History   Not on file  Tobacco Use   Smoking status: Every Day    Packs/day: 0.25    Years: 30.00    Total pack years: 7.50    Types: Cigarettes   Smokeless tobacco: Never  Vaping Use   Vaping Use: Never used  Substance and Sexual Activity   Alcohol use: Yes    Alcohol/week: 6.0 standard drinks of alcohol    Types: 6 Cans of beer per week    Comment: occassionally on weekends   Drug use: No   Sexual activity: Not on file  Other Topics Concern   Not on file  Social History Narrative   Not on file   Social Determinants of Health   Financial Resource Strain: Not on file  Food Insecurity: No Food Insecurity (06/24/2022)   Hunger Vital Sign    Worried About Running Out of Food in the Last Year: Never true    Ran Out of Food in the Last Year: Never true  Transportation Needs: No Transportation Needs (06/24/2022)   PRAPARE - Hydrologist (Medical): No    Lack of  Transportation (Non-Medical): No  Physical Activity: Not on file  Stress: Not on file  Social Connections: Not on file  Intimate Partner Violence: Not At Risk (06/24/2022)   Humiliation, Afraid, Rape, and Kick questionnaire    Fear of Current or Ex-Partner: No    Emotionally Abused: No    Physically Abused: No    Sexually Abused: No   Family History  Problem Relation Age of Onset   Diabetes Mother    BP 106/74   Pulse 83   Wt 69.9 kg (154 lb)   SpO2 98%   BMI 20.89 kg/m   Wt Readings from Last 3 Encounters:  07/15/22 69.9 kg (154 lb)  07/10/22 74.3 kg (163 lb 12.8 oz)  01/03/21 65.8 kg (145 lb)   PHYSICAL EXAM: General:  NAD. No resp difficulty HEENT: Normal Neck: Supple. No JVD. Carotids 2+ bilat; no bruits. No lymphadenopathy or thryomegaly appreciated. Cor: PMI nondisplaced. Regular rate & rhythm. No rubs, gallops or murmurs. Lungs: Clear Abdomen: Soft, nontender, nondistended. No hepatosplenomegaly. No bruits or masses. Good bowel sounds. Steristrips intact to chest tube sites. Extremities: No cyanosis, clubbing, rash, edema Neuro: Alert & oriented x 3, cranial nerves grossly intact. Moves all 4 extremities w/o difficulty. Affect pleasant.  ECG: NSR with RBBB 81 bpm (personally reviewed)  ASSESSMENT & PLAN: 1.  Chronic systolic CHF/Ischemic cardiomyopathy - Echo with EF 35-40%   - LHC w/ MVCAD  - RHC w/ RA 8, PA 48/23 (38), PCWP mean 26, LVEDP 21,  PA sat 49% - S/p CABG x 4 on 07/04/22. - NYHA II, limited by recent CABG. Volume looks good today. - Continue Lasix 40 mg daily. - Continue spironolactone 25 mg daily.  - Continue carvedilol 6.25 mg bid.  - Continue Jardiance. - Continue Entresto 24-26 mg bid.  No BP room to increase  yet. - Labs today. - Repeat echo when GDMT optimized.   2. CAD  - Known hx prior CAD with MI in 2012 and prior multivessel PCI - LHC w/ severe MVCAD including high grade LM disease  - cMRI w/ viable myocardium  - s/p CABG x 4  - No  chest pain. - Continue ASA + high intensity statin. - Start CR when cleared by CVTS.  3. Mitral Regurgitation  - ischemic MR in setting of severe multivessel CAD  - TEE w/ 3+ MR, severe leaflet malcoaptation due to posterior leaflet tethering - cMRI mild-Mod MR  - Intra-operative TEE during CABG showed only mild-mod MR. No indication for MVR - Repeat Echo (07/09/22): EF 35-40%,  mild-mod MR   4. Type 2DM  - A1c 8.2%  - He is on insulin. - Continue Jardiance 10 mg daily. No GU symptoms.   5. IDA + EBLA  - IDA at baseline, required transfusion on admit - Iron 21, sats 4%, treated w/ IV feraheme and received PRBCs - GI consulted. EGD/ Colo>>Non-obstructing Schatzki ring, hiatal hernia, normal stomach, 2 polyps (resected, path pending), diverticulosis, non bleeding hemorrhoids. - VCE: no active bleeding, no AVMs, ulcers, mass. - CBC today.   6. ETOH and tobacco abuse - Complete cessation advised.  7. Daytime fatigue - [facility]   Follow up in 4 weeks with APP and 12 weeks with Dr. Haroldine Laws + echo.  Allena Katz, FNP-BC 07/15/22

## 2022-07-15 NOTE — Patient Instructions (Signed)
It was great to see you today! No medication changes are needed at this time.   Labs today We will only contact you if something comes back abnormal or we need to make some changes. Otherwise no news is good news!  Your physician recommends that you schedule a follow-up appointment in: 4-6 weeks  in the Advanced Practitioners (PA/NP) Clinic   Your physician wants you to follow-up in: 12 weeks with Dr Aundra Dubin and echo. You will receive a reminder letter in the mail two months in advance. If you don't receive a letter, please call our office to schedule the follow-up appointment January 2024.  Do the following things EVERYDAY: Weigh yourself in the morning before breakfast. Write it down and keep it in a log. Take your medicines as prescribed Eat low salt foods--Limit salt (sodium) to 2000 mg per day.  Stay as active as you can everyday Limit all fluids for the day to less than 2 liters  At the [facility], you and your health needs are our priority. As part of our continuing mission to provide you with exceptional heart care, we have created designated Provider Care Teams. These Care Teams include your primary Cardiologist (physician) and Advanced Practice Providers (APPs- Physician Assistants and Nurse Practitioners) who all work together to provide you with the care you need, when you need it.   You may see any of the following providers on your designated Care Team at your next follow up: Dr Glori Bickers Dr Loralie Champagne Dr. Roxana Hires, NP Lyda Jester, [place] [facility] [place], [place] Forestine Na, NP Audry Riles, PharmD   Please be sure to bring in all your medications bottles to every appointment.

## 2022-07-16 ENCOUNTER — Encounter (HOSPITAL_COMMUNITY): Payer: Self-pay

## 2022-07-21 ENCOUNTER — Ambulatory Visit
Admission: RE | Admit: 2022-07-21 | Discharge: 2022-07-21 | Disposition: A | Discharge status: Home / Self Care | Payer: No Typology Code available for payment source | Source: Ambulatory Visit | Attending: Thoracic Surgery (Cardiothoracic Vascular Surgery) | Admitting: Thoracic Surgery (Cardiothoracic Vascular Surgery)

## 2022-07-21 ENCOUNTER — Ambulatory Visit (INDEPENDENT_AMBULATORY_CARE_PROVIDER_SITE_OTHER): Payer: Self-pay | Admitting: Physician Assistant

## 2022-07-21 ENCOUNTER — Other Ambulatory Visit: Payer: Self-pay | Admitting: Thoracic Surgery (Cardiothoracic Vascular Surgery)

## 2022-07-21 VITALS — BP 100/64 | HR 87 | Resp 20 | Ht 72.0 in | Wt 154.1 lb

## 2022-07-21 DIAGNOSIS — Z951 Presence of aortocoronary bypass graft: Secondary | ICD-10-CM

## 2022-07-21 DIAGNOSIS — I25110 Atherosclerotic heart disease of native coronary artery with unstable angina pectoris: Secondary | ICD-10-CM

## 2022-07-21 MED ORDER — SACUBITRIL-VALSARTAN 24-26 MG PO TABS: 1.0000 | ORAL_TABLET | Freq: Two times a day (BID) | ORAL | 2 refills | Status: DC

## 2022-07-21 NOTE — Progress Notes (Signed)
[address]       [place] [address]             [phone]       HPI: Mr. [name] is a 56 year old male with past history of hypertension, dyslipidemia, type 2 diabetes mellitus, who was recently admitted with acute non-ST elevation myocardial infarction.  He was in acute systolic heart failure and was managed by the advanced heart failure team prior to surgery.Marland Kitchen  He was found to have significant coronary artery disease and we were consulted for consideration of coronary bypass grafting.  He was evaluated by the GI team preoperatively and with EGD, colonoscopy, and video endoscopy with no findings for acute bleeding.  He went on to have coronary bypass grafting x4 by Dr. Lavonna Monarch on 07/04/2022.  The mitral valve, which had previously been documented to have moderate to severe regurgitation, was reevaluated with TEE perioperatively and was graded as mild to moderate MR at that time.  Mitral valve intervention was not felt to be indicated.  Postoperatively, he was again managed closely by the advanced heart failure team.  He made a progressive and satisfactory recovery.  He was discharged on postop day 7.  He was seen in follow-up past week by the heart failure team and was doing well at that time and no changes were made to his medications. Since hospital discharge the patient reports gradual improvement in his exercise tolerance.  He denies any chest pain or shortness of breath.  He continues to have some expected soreness in his anterior chest wall and shoulders.   Current Outpatient Medications  Medication Sig Dispense Refill   aspirin EC 81 MG tablet Take 1 tablet (81 mg total) by mouth daily. Swallow whole. 30 tablet 12   Calcium Polycarbophil (FIBER) 625 MG TABS Take 2 tablets by mouth daily.     carvedilol (COREG) 6.25 MG tablet Take 1 tablet (6.25 mg total) by mouth 2 (two) times daily with a meal. 30 tablet 0   clopidogrel (PLAVIX) 75 MG tablet Take 1 tablet  (75 mg total) by mouth daily. 30 tablet 0   empagliflozin (JARDIANCE) 25 MG TABS tablet Take 0.5 tablets by mouth daily.     furosemide (LASIX) 40 MG tablet Take 1 tablet (40 mg total) by mouth daily. 30 tablet 11   glipiZIDE (GLUCOTROL) 5 MG tablet Take 5 mg by mouth daily. 30 minutes prior to a meal     insulin glargine (LANTUS) 100 UNIT/ML Solostar Pen Inject 50 Units into the skin at bedtime.     metFORMIN (GLUCOPHAGE-XR) 500 MG 24 hr tablet Take 500 mg by mouth 2 (two) times daily with a meal.     ondansetron (ZOFRAN) 8 MG tablet Take 4 mg by mouth 3 (three) times daily as needed for nausea.     oxyCODONE (OXY IR/ROXICODONE) 5 MG immediate release tablet Take 1 tablet (5 mg total) by mouth every 4 (four) hours as needed for severe pain. 30 tablet 0   potassium chloride SA (KLOR-CON M) 20 MEQ tablet Take 1 tablet (20 mEq total) by mouth daily. 30 tablet 0   rosuvastatin (CRESTOR) 40 MG tablet Take 1 tablet (40 mg total) by mouth daily. 30 tablet 0   sacubitril-valsartan (ENTRESTO) 24-26 MG Take 1 tablet by mouth 2 (two) times daily. 60 tablet 0   sildenafil (VIAGRA) 100 MG tablet Take 50 mg by mouth daily as needed for erectile dysfunction.     spironolactone (  ALDACTONE) 25 MG tablet Take 1 tablet (25 mg total) by mouth daily. 30 tablet 0   Vitamin D, Ergocalciferol, (DRISDOL) 1.25 MG (50000 UNIT) CAPS capsule Take 50,000 Units by mouth every 7 (seven) days.     No current facility-administered medications for this visit.    Physical Exam: Vital signs BP 100/64 Pulse 87 Respirations 20 SPO2 97% on room air  General: Mr. [name] appears well and is in no distress. Heart: Regular rate and rhythm, no murmur. Chest: Breath sounds clear to auscultation.  Sternotomy incision and chest tube sites are healing appropriately. Extremities: No peripheral edema.  EVH sites left leg are dry and intact.  Diagnostic Tests: CLINICAL DATA:  Status post recent coronary bypass surgery   EXAM: CHEST  - 2 VIEW   COMPARISON:  Previous studies including the examination of 07/09/2022   FINDINGS: Transverse diameter of heart is within normal limits. There is previous coronary artery bypass surgery. Central pulmonary vessels are less prominent. There is interval clearing of patchy infiltrates in both lungs. In the current study, there are no signs of pulmonary edema or focal pulmonary consolidation. There is no pleural effusion or pneumothorax. Deformities noted in multiple left upper ribs suggest old healed fractures. There is a linear metallic density in left upper abdomen, possibly residual from previous intervention.   IMPRESSION: There are no signs of pulmonary edema or focal pulmonary consolidation. Status post coronary bypass surgery.     Electronically Signed   By: Elmer Picker M.D.   On: 07/21/2022 13:31  Impression / Plan: Mr. [name] appears to be making progressive recovery following coronary bypass grafting.  He is being followed by the advanced heart failure clinic and medications are being managed primarily by them.  Chest x-ray obtained today shows the small pleural effusions and basilar atelectasis has completely resolved.  Overall, he is making a progressive and satisfactory recovery. No change in medications from our standpoint Continue to observe sternal precautions for 3 months Follow-up with Dr. Tommi Rumps in 3 weeks.  We will likely make referral for cardiac rehab at that point.     Antony Odea, PA-C [facility] [phone]

## 2022-07-21 NOTE — Patient Instructions (Signed)
Continue to observe sternal precautions with no lifting, pushing, or pulling greater than 10 pounds for 3 months.  No changes in medications from CT surgery standpoint.  Follow-up in 3 weeks with Dr. Lavonna Monarch.

## 2022-07-21 NOTE — Progress Notes (Signed)
[address]       [place] [address]             [phone]       HPI: Mr. [name] is a 57 year old male with past history of hypertension, dyslipidemia, type 2 diabetes mellitus, who was recently admitted with acute non-ST elevation myocardial infarction.  He was in acute systolic heart failure and was managed by the advanced heart failure team prior to surgery.Marland Kitchen  He was found to have significant coronary artery disease and we were consulted for consideration of coronary bypass grafting.  He was evaluated by the GI team preoperatively and with EGD, colonoscopy, and video endoscopy with no findings for acute bleeding.  He went on to have coronary bypass grafting x4 by Dr. Lavonna Monarch on 07/04/2022.  The mitral valve, which had previously been documented to have moderate to severe regurgitation, was reevaluated with TEE perioperatively and was graded as mild to moderate MR at that time.  Mitral valve intervention was not felt to be indicated.  Postoperatively, he was again managed closely by the advanced heart failure team.  He made a progressive and satisfactory recovery.  He was discharged on postop day 7.  He was seen in follow-up past week by the heart failure team and was doing well at that time and no changes were made to his medications. Since hospital discharge the patient reports gradual improvement in his exercise tolerance.  He denies any chest pain or shortness of breath.  He continues to have some expected soreness in his anterior chest wall and shoulders.   Current Outpatient Medications  Medication Sig Dispense Refill   aspirin EC 81 MG tablet Take 1 tablet (81 mg total) by mouth daily. Swallow whole. 30 tablet 12   Calcium Polycarbophil (FIBER) 625 MG TABS Take 2 tablets by mouth daily.     carvedilol (COREG) 6.25 MG tablet Take 1 tablet (6.25 mg total) by mouth 2 (two) times daily with a meal. 30 tablet 0   clopidogrel (PLAVIX) 75 MG tablet Take 1 tablet  (75 mg total) by mouth daily. 30 tablet 0   empagliflozin (JARDIANCE) 25 MG TABS tablet Take 0.5 tablets by mouth daily.     furosemide (LASIX) 40 MG tablet Take 1 tablet (40 mg total) by mouth daily. 30 tablet 11   glipiZIDE (GLUCOTROL) 5 MG tablet Take 5 mg by mouth daily. 30 minutes prior to a meal     insulin glargine (LANTUS) 100 UNIT/ML Solostar Pen Inject 50 Units into the skin at bedtime.     metFORMIN (GLUCOPHAGE-XR) 500 MG 24 hr tablet Take 500 mg by mouth 2 (two) times daily with a meal.     ondansetron (ZOFRAN) 8 MG tablet Take 4 mg by mouth 3 (three) times daily as needed for nausea.     oxyCODONE (OXY IR/ROXICODONE) 5 MG immediate release tablet Take 1 tablet (5 mg total) by mouth every 4 (four) hours as needed for severe pain. 30 tablet 0   potassium chloride SA (KLOR-CON M) 20 MEQ tablet Take 1 tablet (20 mEq total) by mouth daily. 30 tablet 0   rosuvastatin (CRESTOR) 40 MG tablet Take 1 tablet (40 mg total) by mouth daily. 30 tablet 0   sildenafil (VIAGRA) 100 MG tablet Take 50 mg by mouth daily as needed for erectile dysfunction.     spironolactone (ALDACTONE) 25 MG tablet Take 1 tablet (25 mg total) by mouth daily. 30 tablet 0  Vitamin D, Ergocalciferol, (DRISDOL) 1.25 MG (50000 UNIT) CAPS capsule Take 50,000 Units by mouth every 7 (seven) days.     sacubitril-valsartan (ENTRESTO) 24-26 MG Take 1 tablet by mouth 2 (two) times daily. 60 tablet 2   No current facility-administered medications for this visit.    Physical Exam: Vital signs BP 100/64 Pulse 87 Respirations 20 SPO2 97% on room air  General: Mr. [name] appears well and is in no distress. Heart: Regular rate and rhythm, no murmur. Chest: Breath sounds clear to auscultation.  Sternotomy incision and chest tube sites are healing appropriately. Extremities: No peripheral edema.  EVH sites left leg are dry and intact.  Diagnostic Tests: CLINICAL DATA:  Status post recent coronary bypass surgery   EXAM: CHEST  - 2 VIEW   COMPARISON:  Previous studies including the examination of 07/09/2022   FINDINGS: Transverse diameter of heart is within normal limits. There is previous coronary artery bypass surgery. Central pulmonary vessels are less prominent. There is interval clearing of patchy infiltrates in both lungs. In the current study, there are no signs of pulmonary edema or focal pulmonary consolidation. There is no pleural effusion or pneumothorax. Deformities noted in multiple left upper ribs suggest old healed fractures. There is a linear metallic density in left upper abdomen, possibly residual from previous intervention.   IMPRESSION: There are no signs of pulmonary edema or focal pulmonary consolidation. Status post coronary bypass surgery.     Electronically Signed   By: Elmer Picker M.D.   On: 07/21/2022 13:31  Impression / Plan: Mr. [name] appears to be making progressive recovery following coronary bypass grafting.  He is being followed by the advanced heart failure clinic and medications are being managed primarily by them.  Chest x-ray obtained today shows the small pleural effusions and basilar atelectasis has completely resolved.  Overall, he is making a progressive and satisfactory recovery. No change in medications from our standpoint Continue to observe sternal precautions for 3 months Follow-up with Dr. Tommi Rumps in 3 weeks.  We will likely make referral for cardiac rehab at that point.     Antony Odea, PA-C [facility] [phone]

## 2022-08-11 ENCOUNTER — Ambulatory Visit: Payer: Self-pay | Admitting: Thoracic Surgery (Cardiothoracic Vascular Surgery)

## 2022-08-12 ENCOUNTER — Encounter: Payer: Self-pay | Admitting: Thoracic Surgery (Cardiothoracic Vascular Surgery)

## 2022-08-19 ENCOUNTER — Encounter (HOSPITAL_COMMUNITY): Payer: No Typology Code available for payment source

## 2022-09-10 ENCOUNTER — Other Ambulatory Visit (HOSPITAL_COMMUNITY): Payer: Self-pay | Admitting: Cardiovascular Disease

## 2022-09-10 ENCOUNTER — Encounter (HOSPITAL_COMMUNITY): Payer: No Typology Code available for payment source

## 2022-09-10 DIAGNOSIS — I10 Essential (primary) hypertension: Secondary | ICD-10-CM

## 2022-09-18 ENCOUNTER — Ambulatory Visit (HOSPITAL_COMMUNITY): Admission: RE | Admit: 2022-09-18 | Payer: No Typology Code available for payment source | Source: Ambulatory Visit

## 2022-09-22 ENCOUNTER — Ambulatory Visit (HOSPITAL_COMMUNITY): Admission: RE | Admit: 2022-09-22 | Payer: No Typology Code available for payment source | Source: Ambulatory Visit

## 2022-10-07 ENCOUNTER — Ambulatory Visit (HOSPITAL_COMMUNITY)
Admission: RE | Admit: 2022-10-07 | Discharge: 2022-10-07 | Disposition: A | Discharge status: Home / Self Care | Payer: Medicaid Other | Source: Ambulatory Visit | Attending: Cardiology | Admitting: Cardiology

## 2022-10-07 ENCOUNTER — Encounter (HOSPITAL_COMMUNITY): Payer: Self-pay | Admitting: Cardiology

## 2022-10-07 ENCOUNTER — Ambulatory Visit (HOSPITAL_BASED_OUTPATIENT_CLINIC_OR_DEPARTMENT_OTHER)
Admission: RE | Admit: 2022-10-07 | Discharge: 2022-10-07 | Disposition: A | Discharge status: Home / Self Care | Payer: Medicaid Other | Source: Ambulatory Visit | Attending: Cardiology | Admitting: Cardiology

## 2022-10-07 VITALS — BP 130/80 | HR 84 | Wt 166.2 lb

## 2022-10-07 DIAGNOSIS — F17210 Nicotine dependence, cigarettes, uncomplicated: Secondary | ICD-10-CM | POA: Diagnosis not present

## 2022-10-07 DIAGNOSIS — I255 Ischemic cardiomyopathy: Secondary | ICD-10-CM | POA: Insufficient documentation

## 2022-10-07 DIAGNOSIS — Z7982 Long term (current) use of aspirin: Secondary | ICD-10-CM | POA: Diagnosis not present

## 2022-10-07 DIAGNOSIS — I340 Nonrheumatic mitral (valve) insufficiency: Secondary | ICD-10-CM | POA: Insufficient documentation

## 2022-10-07 DIAGNOSIS — I5022 Chronic systolic (congestive) heart failure: Secondary | ICD-10-CM

## 2022-10-07 DIAGNOSIS — E785 Hyperlipidemia, unspecified: Secondary | ICD-10-CM | POA: Diagnosis not present

## 2022-10-07 DIAGNOSIS — E119 Type 2 diabetes mellitus without complications: Secondary | ICD-10-CM | POA: Insufficient documentation

## 2022-10-07 DIAGNOSIS — Z951 Presence of aortocoronary bypass graft: Secondary | ICD-10-CM | POA: Diagnosis not present

## 2022-10-07 DIAGNOSIS — G4733 Obstructive sleep apnea (adult) (pediatric): Secondary | ICD-10-CM | POA: Insufficient documentation

## 2022-10-07 DIAGNOSIS — Z79899 Other long term (current) drug therapy: Secondary | ICD-10-CM | POA: Diagnosis not present

## 2022-10-07 DIAGNOSIS — I2510 Atherosclerotic heart disease of native coronary artery without angina pectoris: Secondary | ICD-10-CM | POA: Insufficient documentation

## 2022-10-07 DIAGNOSIS — I110 Hypertensive heart disease with heart failure: Secondary | ICD-10-CM | POA: Diagnosis not present

## 2022-10-07 DIAGNOSIS — I252 Old myocardial infarction: Secondary | ICD-10-CM | POA: Diagnosis not present

## 2022-10-07 DIAGNOSIS — Z7984 Long term (current) use of oral hypoglycemic drugs: Secondary | ICD-10-CM | POA: Insufficient documentation

## 2022-10-07 LAB — COMPREHENSIVE METABOLIC PANEL
ALT: 18 U/L (ref 0–44)
AST: 23 U/L (ref 15–41)
Albumin: 4.2 g/dL (ref 3.5–5.0)
Alkaline Phosphatase: 69 U/L (ref 38–126)
Anion gap: 13 (ref 5–15)
BUN: 7 mg/dL (ref 6–20)
CO2: 21 mmol/L — ABNORMAL LOW (ref 22–32)
Calcium: 9.7 mg/dL (ref 8.9–10.3)
Chloride: 101 mmol/L (ref 98–111)
Creatinine, Ser: 0.73 mg/dL (ref 0.61–1.24)
GFR, Estimated: >60 mL/min (ref >60)
Glucose, Bld: 163 mg/dL — ABNORMAL HIGH (ref 70–99)
Potassium: 4.2 mmol/L (ref 3.5–5.1)
Sodium: 135 mmol/L (ref 135–145)
Total Bilirubin: 0.4 mg/dL (ref 0.3–1.2)
Total Protein: 8.1 g/dL (ref 6.5–8.1)

## 2022-10-07 LAB — LIPID PANEL
Cholesterol: 217 mg/dL — ABNORMAL HIGH (ref 0–200)
HDL: 46 mg/dL (ref >40)
LDL Cholesterol: 147 mg/dL — ABNORMAL HIGH (ref 0–99)
Total CHOL/HDL Ratio: 4.7 RATIO
Triglycerides: 122 mg/dL (ref <150)
VLDL: 24 mg/dL (ref 0–40)

## 2022-10-07 LAB — CBC
HCT: 42.9 % (ref 39.0–52.0)
Hemoglobin: 14.7 g/dL (ref 13.0–17.0)
MCH: 32.7 pg (ref 26.0–34.0)
MCHC: 34.3 g/dL (ref 30.0–36.0)
MCV: 95.3 fL (ref 80.0–100.0)
Platelets: 211 10*3/uL (ref 150–400)
RBC: 4.5 MIL/uL (ref 4.22–5.81)
RDW: 16.3 % — ABNORMAL HIGH (ref 11.5–15.5)
WBC: 5.2 10*3/uL (ref 4.0–10.5)
nRBC: 0 % (ref 0.0–0.2)

## 2022-10-07 LAB — ECHOCARDIOGRAM COMPLETE
Area-P 1/2: 3.4 cm2
Calc EF: 34.5 %
MV M vel: 1.39 m/s
MV Peak grad: 7.8 mmHg
MV VTI: 2.31 cm2
S' Lateral: 2.9 cm
Single Plane A2C EF: 27.8 %
Single Plane A4C EF: 36.3 %

## 2022-10-07 MED ORDER — ENTRESTO 49-51 MG PO TABS: 1.0000 | ORAL_TABLET | Freq: Two times a day (BID) | ORAL | 11 refills | Status: DC

## 2022-10-07 MED ORDER — VARENICLINE TARTRATE (STARTER) 0.5 MG X 11 & 1 MG X 42 PO TBPK: ORAL_TABLET | ORAL | 0 refills | Status: DC

## 2022-10-07 NOTE — Progress Notes (Signed)
[facility]  HPI: [name] is a 56 y.o. AAM w/ h/o multivessel CAD s/p remote MI in 2012 and multiple PCIs to LAD, diagonal, ramus and OM vessels, HTN, HLD, Type 2 DM and tobacco/ETOH use.    Admitted 9/23 with NSTEMI. Echo showed EF 40-45%, severe mitral valve dysfunction w/ severe leaflet malcoaptation due to posterior leaflet tethering, severe MR and severely elevated RVSP 61 mmHg, RV normal. TEE confirmed mod-severe MR w/ tethered posterior leaflet, felt likely ischemic MR. TEE w/ + WMAs of mid to distal anterior and apical walls, anterolateral walls and basal to mid inferior and inferolateral walls, suggestive of multivessel territory ischemia/infarct. Underwent R/LHC showing 75% stenosis of m-dLM, 90% Ost- Prox Cx, 50% pRCA and 80% mRCA; elevated wedge (26), preserved CO (Fick) 5.4. Milrinone started to lower PA pressures. GI consulted due to bleeding, and underwent EGD/Colonoscopy showing hiatial hernia and 2 polyps resected, diverticulosis; capsule endoscopy showed no active bleeding/AVMs. He had cMRI showing LVEF 29% with viable myocardium, and mild to moderate MR, RVEF 61%. Underwent CABG x 4 (LIMA-LAD, SVG-PDA, sequential SVG-ramus and OM). Perioperative TEE showed only mild-mod MR. No indication for MVR. Drips eventually weaned off and GDMT started. He was discharged home, weight 160 lbs.  Echo was done today and reviewed, EF 40-45%, severe apical hypokinesis, moderate RV dysfunction, no MR.    He returns for followup of CHF. No chest pain.  He is short of breath walking up stairs. No dyspnea walking on flat ground.  No lightheadedness.  No orthopnea/PND.  No palpitations.  Weight is up but he states that he is eating better.  He is using CPAP.   ECG (personally reviewed): NSR, RBBB  Labs (9/23): K 4.3, creatinine 1.0  PMH: 1. Chronic systolic CHF: Ischemic cardiomyopathy.  - TEE (06/25/22): LVEF 40-45%, no PFO, no LAA thrombus, severe LAE, moderate to severe MR with a centro/posteriorly  directed jet, likely due to ischemic tethering of the posterior leaflet and dilated annulus in the setting of severe LAE - cMRI (9/23): LVEF 29% with viable myocardium, RVEF 61%, mild to moderate MR - Echo (9/23): EF 40-45%, grade III DD, RV OK, severe MR with leaflet malcoaptation due to posterior leaflet tethering. - Echo (12/23): EF 40-45%, severe apical hypokinesis, moderate RV dysfunction, no MR.  2. Mitral regurgitation: Possible ischemic MR.  Echo 12/23 showed no significant MR (after CABG).  3. Smoker 4. HTN 5. Hyperlipidemia 6. CAD: MI in 2012 and multiple PCIs to LAD, diagonal, ramus and OM vessels.  - NSTEMI 9/23, cath with 3 vessel disease. CABG (9/23) with LIMA-LAD, SVG-PDA, sequential SVG-ramus and OM.  7. Type 2 diabetes.  8. OSA: CPAP used.     Review of Systems: All systems reviewed and negative except as per HPI.    Current Outpatient Medications  Medication Sig Dispense Refill   aspirin EC 81 MG tablet Take 1 tablet (81 mg total) by mouth daily. Swallow whole. 30 tablet 12   Calcium Polycarbophil (FIBER) 625 MG TABS Take 2 tablets by mouth daily.     carvedilol (COREG) 6.25 MG tablet Take 1 tablet (6.25 mg total) by mouth 2 (two) times daily with a meal. 30 tablet 0   clopidogrel (PLAVIX) 75 MG tablet Take 1 tablet (75 mg total) by mouth daily. 30 tablet 0   empagliflozin (JARDIANCE) 25 MG TABS tablet Take 25 mg by mouth daily.     furosemide (LASIX) 40 MG tablet Take 1 tablet (40 mg total) by mouth daily. 30 tablet  11   glipiZIDE (GLUCOTROL) 5 MG tablet Take 5 mg by mouth daily. 30 minutes prior to a meal     insulin glargine (LANTUS) 100 UNIT/ML Solostar Pen Inject 50 Units into the skin at bedtime.     metFORMIN (GLUCOPHAGE-XR) 500 MG 24 hr tablet Take 500 mg by mouth 2 (two) times daily with a meal.     ondansetron (ZOFRAN) 8 MG tablet Take 4 mg by mouth 3 (three) times daily as needed for nausea.     potassium chloride SA (KLOR-CON M) 20 MEQ tablet Take 1 tablet  (20 mEq total) by mouth daily. 30 tablet 0   rosuvastatin (CRESTOR) 40 MG tablet Take 1 tablet (40 mg total) by mouth daily. 30 tablet 0   sacubitril-valsartan (ENTRESTO) 49-51 MG Take 1 tablet by mouth 2 (two) times daily. 60 tablet 11   sildenafil (VIAGRA) 100 MG tablet Take 50 mg by mouth daily as needed for erectile dysfunction.     spironolactone (ALDACTONE) 25 MG tablet Take 1 tablet (25 mg total) by mouth daily. 30 tablet 0   Varenicline Tartrate, Starter, (CHANTIX STARTING MONTH PAK) 0.5 MG X 11 & 1 MG X 42 TBPK As directed 53 each 0   No current facility-administered medications for this encounter.   No Known Allergies  Social History   Socioeconomic History   Marital status: Divorced    Spouse name: Not on file   Number of children: Not on file   Years of education: Not on file   Highest education level: Not on file  Occupational History   Not on file  Tobacco Use   Smoking status: Every Day    Packs/day: 0.25    Years: 30.00    Total pack years: 7.50    Types: Cigarettes   Smokeless tobacco: Never  Vaping Use   Vaping Use: Never used  Substance and Sexual Activity   Alcohol use: Yes    Alcohol/week: 6.0 standard drinks of alcohol    Types: 6 Cans of beer per week    Comment: occassionally on weekends   Drug use: No   Sexual activity: Not on file  Other Topics Concern   Not on file  Social History Narrative   Not on file   Social Determinants of Health   Financial Resource Strain: Not on file  Food Insecurity: No Food Insecurity (06/24/2022)   Hunger Vital Sign    Worried About Running Out of Food in the Last Year: Never true    Ran Out of Food in the Last Year: Never true  Transportation Needs: No Transportation Needs (06/24/2022)   PRAPARE - Hydrologist (Medical): No    Lack of Transportation (Non-Medical): No  Physical Activity: Not on file  Stress: Not on file  Social Connections: Not on file  Intimate Partner Violence:  Not At Risk (06/24/2022)   Humiliation, Afraid, Rape, and Kick questionnaire    Fear of Current or Ex-Partner: No    Emotionally Abused: No    Physically Abused: No    Sexually Abused: No   Family History  Problem Relation Age of Onset   Diabetes Mother    BP 130/80   Pulse 84   Wt 75.4 kg (166 lb 3.2 oz)   SpO2 98%   BMI 22.54 kg/m   Wt Readings from Last 3 Encounters:  10/07/22 75.4 kg (166 lb 3.2 oz)  07/21/22 69.9 kg (154 lb 1.6 oz)  07/15/22 69.9 kg (154 lb)  PHYSICAL EXAM: General: NAD Neck: JVP 8 cm, no thyromegaly or thyroid nodule.  Lungs: Clear to auscultation bilaterally with normal respiratory effort. CV: Nondisplaced PMI.  Heart regular S1/S2, no S3/S4, no murmur.  No peripheral edema.  No carotid bruit.  Normal pedal pulses.  Abdomen: Soft, nontender, no hepatosplenomegaly, no distention.  Skin: Intact without lesions or rashes.  Neurologic: Alert and oriented x 3.  Psych: Normal affect. Extremities: No clubbing or cyanosis.  HEENT: Normal.   ASSESSMENT & PLAN: 1.  Chronic systolic CHF/Ischemic cardiomyopathy: cMRI in 9/23 with EF as low as 29%.  Echo today (post-CABG) with EF 40-45%, severe apical hypokinesis, moderate RV dysfunction, no MR.  NYHA class II symptoms.  - Continue Lasix 40 mg daily, BMET today. - Continue spironolactone 25 mg daily.  - Continue carvedilol 6.25 mg bid.  - Continue Jardiance. - Increase Entresto to 49/51 bid.  BMET 10 days.  - Patient appears to be out of ICD range.  2. CAD: Known hx prior CAD with MI in 2012 and prior multivessel PCI. NSTEMI 9/23 with LHC showing severe multivessel CAD including high grade LM disease. cMRI in 9/23 w/viable myocardium.  S/p CABG x 4 in 9/23. No chest pain.  - Continue ASA 81 daily.  - Continue Crestor 40 daily, check lipids today.  - Refer for cardiac rehab.  3. Mitral Regurgitation: Ischemic MR in setting of severe multivessel CAD.  This is significantly improved post-CABG, echo today with  minimal MR.   4. Type 2DM - Continue Jardiance 10 mg daily.  5. Smoking: Wants to quit.  - Prescription for Chantix given.  6. OSA: Continue CPAP.    Followup in 2 months with APP.   Loralie Champagne 10/07/2022

## 2022-10-07 NOTE — Patient Instructions (Signed)
INCREASE Entresto to 49/51 mg Twice daily  START Chantix as directed on box  Labs done today, your results will be available in MyChart, we will contact you for abnormal readings.  Repeat blood work in 10 days  You have been referred to Cardiac Rehab. They will call you to arrange your appointment  Your physician recommends that you schedule a follow-up appointment in: 2 months  If you have any questions or concerns before your next appointment please send [place] a message through [facility] or call our office at [phone].    TO LEAVE A MESSAGE FOR THE NURSE SELECT OPTION 2, PLEASE LEAVE A MESSAGE INCLUDING: YOUR NAME DATE OF BIRTH CALL BACK NUMBER REASON FOR CALL**this is important as we prioritize the call backs  YOU WILL RECEIVE A CALL BACK THE SAME DAY AS LONG AS YOU CALL BEFORE 4:00 PM At the [facility], you and your health needs are our priority. As part of our continuing mission to provide you with exceptional heart care, we have created designated Provider Care Teams. These Care Teams include your primary Cardiologist (physician) and Advanced Practice Providers (APPs- Physician Assistants and Nurse Practitioners) who all work together to provide you with the care you need, when you need it.   You may see any of the following providers on your designated Care Team at your next follow up: Dr Glori Bickers Dr Loralie Champagne Dr. Roxana Hires, NP Lyda Jester, [place] [facility] [place], [place] Forestine Na, NP Audry Riles, PharmD   Please be sure to bring in all your medications bottles to every appointment.

## 2022-10-07 NOTE — Progress Notes (Signed)
  Echocardiogram 2D Echocardiogram has been performed.  Wynelle Link 10/07/2022, 10:08 AM

## 2022-10-08 ENCOUNTER — Other Ambulatory Visit (HOSPITAL_COMMUNITY): Payer: Self-pay

## 2022-10-08 ENCOUNTER — Telehealth (HOSPITAL_COMMUNITY): Payer: Self-pay

## 2022-10-08 DIAGNOSIS — E785 Hyperlipidemia, unspecified: Secondary | ICD-10-CM

## 2022-10-08 NOTE — Telephone Encounter (Signed)
Patient aware and agreeable. Referral placed for lipid clinic

## 2022-10-21 ENCOUNTER — Other Ambulatory Visit (HOSPITAL_COMMUNITY): Payer: No Typology Code available for payment source

## 2022-11-02 ENCOUNTER — Emergency Department (HOSPITAL_COMMUNITY): Payer: No Typology Code available for payment source

## 2022-11-02 ENCOUNTER — Encounter (HOSPITAL_COMMUNITY): Payer: Self-pay

## 2022-11-02 ENCOUNTER — Inpatient Hospital Stay (HOSPITAL_COMMUNITY)
Admission: EM | Admit: 2022-11-02 | Discharge: 2022-11-05 | DRG: 682 | Disposition: A | Discharge status: Home / Self Care | Payer: No Typology Code available for payment source | Attending: Internal Medicine | Admitting: Internal Medicine

## 2022-11-02 DIAGNOSIS — E871 Hypo-osmolality and hyponatremia: Secondary | ICD-10-CM | POA: Diagnosis present

## 2022-11-02 DIAGNOSIS — R112 Nausea with vomiting, unspecified: Secondary | ICD-10-CM

## 2022-11-02 DIAGNOSIS — I252 Old myocardial infarction: Secondary | ICD-10-CM

## 2022-11-02 DIAGNOSIS — Z794 Long term (current) use of insulin: Secondary | ICD-10-CM

## 2022-11-02 DIAGNOSIS — I10 Essential (primary) hypertension: Secondary | ICD-10-CM | POA: Diagnosis present

## 2022-11-02 DIAGNOSIS — R066 Hiccough: Secondary | ICD-10-CM | POA: Diagnosis present

## 2022-11-02 DIAGNOSIS — K852 Alcohol induced acute pancreatitis without necrosis or infection: Secondary | ICD-10-CM | POA: Diagnosis present

## 2022-11-02 DIAGNOSIS — E1169 Type 2 diabetes mellitus with other specified complication: Secondary | ICD-10-CM | POA: Diagnosis present

## 2022-11-02 DIAGNOSIS — T502X5A Adverse effect of carbonic-anhydrase inhibitors, benzothiadiazides and other diuretics, initial encounter: Secondary | ICD-10-CM | POA: Diagnosis present

## 2022-11-02 DIAGNOSIS — Z7982 Long term (current) use of aspirin: Secondary | ICD-10-CM

## 2022-11-02 DIAGNOSIS — Z951 Presence of aortocoronary bypass graft: Secondary | ICD-10-CM

## 2022-11-02 DIAGNOSIS — Z7902 Long term (current) use of antithrombotics/antiplatelets: Secondary | ICD-10-CM

## 2022-11-02 DIAGNOSIS — E876 Hypokalemia: Secondary | ICD-10-CM | POA: Diagnosis present

## 2022-11-02 DIAGNOSIS — N179 Acute kidney failure, unspecified: Secondary | ICD-10-CM | POA: Diagnosis not present

## 2022-11-02 DIAGNOSIS — E1165 Type 2 diabetes mellitus with hyperglycemia: Secondary | ICD-10-CM | POA: Diagnosis present

## 2022-11-02 DIAGNOSIS — E869 Volume depletion, unspecified: Secondary | ICD-10-CM | POA: Diagnosis present

## 2022-11-02 DIAGNOSIS — Z7984 Long term (current) use of oral hypoglycemic drugs: Secondary | ICD-10-CM

## 2022-11-02 DIAGNOSIS — Z79899 Other long term (current) drug therapy: Secondary | ICD-10-CM

## 2022-11-02 DIAGNOSIS — I5042 Chronic combined systolic (congestive) and diastolic (congestive) heart failure: Secondary | ICD-10-CM | POA: Diagnosis present

## 2022-11-02 DIAGNOSIS — I2510 Atherosclerotic heart disease of native coronary artery without angina pectoris: Secondary | ICD-10-CM | POA: Diagnosis present

## 2022-11-02 DIAGNOSIS — E7800 Pure hypercholesterolemia, unspecified: Secondary | ICD-10-CM | POA: Diagnosis present

## 2022-11-02 DIAGNOSIS — K859 Acute pancreatitis without necrosis or infection, unspecified: Secondary | ICD-10-CM | POA: Diagnosis present

## 2022-11-02 DIAGNOSIS — K219 Gastro-esophageal reflux disease without esophagitis: Secondary | ICD-10-CM | POA: Diagnosis present

## 2022-11-02 DIAGNOSIS — I110 Hypertensive heart disease with heart failure: Secondary | ICD-10-CM | POA: Diagnosis present

## 2022-11-02 DIAGNOSIS — F17210 Nicotine dependence, cigarettes, uncomplicated: Secondary | ICD-10-CM | POA: Diagnosis present

## 2022-11-02 DIAGNOSIS — F1010 Alcohol abuse, uncomplicated: Secondary | ICD-10-CM | POA: Diagnosis present

## 2022-11-02 DIAGNOSIS — Z833 Family history of diabetes mellitus: Secondary | ICD-10-CM

## 2022-11-02 DIAGNOSIS — K860 Alcohol-induced chronic pancreatitis: Secondary | ICD-10-CM | POA: Diagnosis present

## 2022-11-02 LAB — URINALYSIS, ROUTINE W REFLEX MICROSCOPIC
Bacteria, UA: NONE SEEN
Bilirubin Urine: NEGATIVE
Glucose, UA: >=500 mg/dL — AB
Ketones, ur: 20 mg/dL — AB
Leukocytes,Ua: NEGATIVE
Nitrite: NEGATIVE
Protein, ur: 100 mg/dL — AB
Specific Gravity, Urine: 1.023 (ref 1.005–1.030)
pH: 5 (ref 5.0–8.0)

## 2022-11-02 LAB — COMPREHENSIVE METABOLIC PANEL
ALT: 24 U/L (ref 0–44)
AST: 22 U/L (ref 15–41)
Albumin: 4.6 g/dL (ref 3.5–5.0)
Alkaline Phosphatase: 64 U/L (ref 38–126)
BUN: 50 mg/dL — ABNORMAL HIGH (ref 6–20)
CO2: 18 mmol/L — ABNORMAL LOW (ref 22–32)
Calcium: 10.3 mg/dL (ref 8.9–10.3)
Chloride: 81 mmol/L — ABNORMAL LOW (ref 98–111)
Creatinine, Ser: 2.46 mg/dL — ABNORMAL HIGH (ref 0.61–1.24)
GFR, Estimated: 30 mL/min — ABNORMAL LOW (ref >60)
Glucose, Bld: 359 mg/dL — ABNORMAL HIGH (ref 70–99)
Potassium: 3.7 mmol/L (ref 3.5–5.1)
Sodium: 125 mmol/L — ABNORMAL LOW (ref 135–145)
Total Bilirubin: 1.4 mg/dL — ABNORMAL HIGH (ref 0.3–1.2)
Total Protein: 8.9 g/dL — ABNORMAL HIGH (ref 6.5–8.1)

## 2022-11-02 LAB — LIPASE, BLOOD: Lipase: 200 U/L — ABNORMAL HIGH (ref 11–51)

## 2022-11-02 LAB — CBC
HCT: 45.8 % (ref 39.0–52.0)
Hemoglobin: 16.1 g/dL (ref 13.0–17.0)
MCH: 32.2 pg (ref 26.0–34.0)
MCHC: 35.2 g/dL (ref 30.0–36.0)
MCV: 91.6 fL (ref 80.0–100.0)
Platelets: 153 10*3/uL (ref 150–400)
RBC: 5 MIL/uL (ref 4.22–5.81)
RDW: 13.9 % (ref 11.5–15.5)
WBC: 6.1 10*3/uL (ref 4.0–10.5)
nRBC: 0 % (ref 0.0–0.2)

## 2022-11-02 LAB — BLOOD GAS, VENOUS
Acid-base deficit: 8.8 mmol/L — ABNORMAL HIGH (ref 0.0–2.0)
Bicarbonate: 17.4 mmol/L — ABNORMAL LOW (ref 20.0–28.0)
O2 Saturation: 69.2 %
Patient temperature: 37
pCO2, Ven: 38 mmHg — ABNORMAL LOW (ref 44–60)
pH, Ven: 7.27 (ref 7.25–7.43)
pO2, Ven: 42 mmHg (ref 32–45)

## 2022-11-02 LAB — TROPONIN I (HIGH SENSITIVITY)
Troponin I (High Sensitivity): 43 ng/L — ABNORMAL HIGH (ref <18)
Troponin I (High Sensitivity): 25 ng/L — ABNORMAL HIGH (ref <18)

## 2022-11-02 MED ORDER — ONDANSETRON 4 MG PO TBDP
4.0000 mg | ORAL_TABLET | Freq: Once | ORAL | Status: DC | PRN
  Filled 2022-11-02: qty 1

## 2022-11-02 MED ORDER — ONDANSETRON HCL 4 MG/2ML IJ SOLN
4.0000 mg | Freq: Four times a day (QID) | INTRAMUSCULAR | Status: DC | PRN
  Administered 2022-11-03: 4 mg via INTRAVENOUS
  Filled 2022-11-02: qty 2

## 2022-11-02 MED ORDER — ONDANSETRON HCL 4 MG PO TABS: 4.0000 mg | ORAL_TABLET | Freq: Four times a day (QID) | ORAL | Status: DC | PRN

## 2022-11-02 MED ORDER — SODIUM CHLORIDE 0.9 % IV BOLUS
500.0000 mL | Freq: Once | INTRAVENOUS | Status: AC
  Administered 2022-11-02: 500 mL via INTRAVENOUS

## 2022-11-02 MED ORDER — ACETAMINOPHEN 650 MG RE SUPP: 650.0000 mg | Freq: Four times a day (QID) | RECTAL | Status: DC | PRN

## 2022-11-02 MED ORDER — ONDANSETRON HCL 4 MG/2ML IJ SOLN
4.0000 mg | Freq: Once | INTRAMUSCULAR | Status: AC
  Administered 2022-11-02: 4 mg via INTRAVENOUS
  Filled 2022-11-02: qty 2

## 2022-11-02 MED ORDER — HYDROMORPHONE HCL 1 MG/ML IJ SOLN
0.5000 mg | INTRAMUSCULAR | Status: DC | PRN
  Administered 2022-11-02: 0.5 mg via INTRAVENOUS
  Filled 2022-11-02: qty 1

## 2022-11-02 MED ORDER — SODIUM CHLORIDE 0.9 % IV SOLN: Freq: Once | INTRAVENOUS | Status: AC

## 2022-11-02 MED ORDER — ENOXAPARIN SODIUM 30 MG/0.3ML IJ SOSY
30.0000 mg | PREFILLED_SYRINGE | INTRAMUSCULAR | Status: DC
  Administered 2022-11-02: 30 mg via SUBCUTANEOUS
  Filled 2022-11-02: qty 0.3

## 2022-11-02 MED ORDER — PANTOPRAZOLE SODIUM 40 MG IV SOLR
40.0000 mg | INTRAVENOUS | Status: DC
  Administered 2022-11-02: 40 mg via INTRAVENOUS
  Filled 2022-11-02: qty 10

## 2022-11-02 MED ORDER — METOPROLOL TARTRATE 5 MG/5ML IV SOLN
5.0000 mg | Freq: Three times a day (TID) | INTRAVENOUS | Status: DC
  Administered 2022-11-02 – 2022-11-04 (×5): 5 mg via INTRAVENOUS
  Filled 2022-11-02 (×5): qty 5

## 2022-11-02 MED ORDER — ACETAMINOPHEN 325 MG PO TABS: 650.0000 mg | ORAL_TABLET | Freq: Four times a day (QID) | ORAL | Status: DC | PRN

## 2022-11-02 MED ORDER — SODIUM CHLORIDE 0.9 % IV SOLN: INTRAVENOUS | Status: DC

## 2022-11-02 MED ORDER — INSULIN GLARGINE-YFGN 100 UNIT/ML ~~LOC~~ SOLN
30.0000 [IU] | Freq: Every day | SUBCUTANEOUS | Status: DC
  Administered 2022-11-02 – 2022-11-04 (×3): 30 [IU] via SUBCUTANEOUS
  Filled 2022-11-02 (×5): qty 0.3

## 2022-11-02 MED ORDER — SODIUM CHLORIDE 0.9 % IV BOLUS
1000.0000 mL | Freq: Once | INTRAVENOUS | Status: AC
  Administered 2022-11-02: 1000 mL via INTRAVENOUS

## 2022-11-02 NOTE — ED Provider Triage Note (Signed)
Emergency Medicine Provider Triage Evaluation Note  [name] , a 57 y.o. male  was evaluated in triage.  Pt complains of nausea, vomiting, flank pain for a week, reports he has not been able to keep anything down. Hx of CABG in September. Has been taking zofran prn at home but reports vomiting regardless.  Review of Systems  Positive: Flank pain, back pain, NV Negative: Fever, chills, diarrhea  Physical Exam  BP (!) 139/106 (BP Location: Left Arm)   Pulse (!) 119   Temp (!) 97.5 F (36.4 C) (Oral)   Resp (!) 1   SpO2 100%  Gen:   Awake, no distress   Resp:  Normal effort  MSK:   Moves extremities without difficulty  Other:  No anterior abdominal ttp, left flank pain noted, no CVA ttp  Medical Decision Making  Medically screening exam initiated at 1:02 PM.  Appropriate orders placed.  [name] was informed that the remainder of the evaluation will be completed by another provider, this initial triage assessment does not replace that evaluation, and the importance of remaining in the ED until their evaluation is complete.  Workup initiated   Anselmo Pickler, [place] 11/02/22 1305

## 2022-11-02 NOTE — ED Provider Notes (Signed)
Emergency Department Provider Note   I have reviewed the triage vital signs and the nursing notes.   HISTORY  Chief Complaint Emesis and Back Pain   HPI [name] is a 57 y.o. male past history of diabetes, alcohol use, CAD status post CABG presents to the emergency department with vomiting and lower abdominal/back discomfort.  Symptoms have been ongoing for the past week.  He has been unable to keep down any solids or liquids.  He has been trying Zofran at home with no relief in symptoms.  Denies any epigastric abdominal pain.  Denies any known history of pancreatitis in the past.  He is not having any discomfort in the chest or shortness of breath.  No fevers or chills.  No UTI symptoms.   Past Medical History:  Diagnosis Date   Acid reflux    Concussion    Coronary artery disease    Diabetes (HCC)    ETOH abuse    High cholesterol    Hypertension    MI (myocardial infarction) ([facility])    2012   Sleep apnea    USES CPAP AS NEEDED   Tobacco abuse     Review of Systems  Constitutional: No fever/chills Cardiovascular: Denies chest pain. Respiratory: Denies shortness of breath. Gastrointestinal: Positive lower abdominal pain and nausea/vomiting. No diarrhea.  Genitourinary: Decreased urine output.  Musculoskeletal: Negative for back pain. Skin: Negative for rash. Neurological: Negative for headaches, focal weakness or numbness.  ____________________________________________   PHYSICAL EXAM:  VITAL SIGNS: ED Triage Vitals [11/02/22 1202]  Enc Vitals Group     BP (!) 139/106     Pulse Rate (!) 119     Resp (!) 1     Temp (!) 97.5 F (36.4 C)     Temp Source Oral     SpO2 100 %   Constitutional: Alert and oriented. Well appearing and in no acute distress. Eyes: Conjunctivae are normal.  Head: Atraumatic. Nose: No congestion/rhinnorhea. Mouth/Throat:  Mucous membranes are dry. Oropharynx non-erythematous. Neck: No stridor.  Cardiovascular:  Tachycardia. Good peripheral circulation. Grossly normal heart sounds.   Respiratory: Normal respiratory effort.  No retractions. Lungs CTAB. Gastrointestinal: Soft and nontender. No distention.  Musculoskeletal: No lower extremity tenderness nor edema. No gross deformities of extremities. Neurologic:  Normal speech and language. No gross focal neurologic deficits are appreciated.  Skin:  Skin is warm, dry and intact. No rash noted.  ____________________________________________   LABS (all labs ordered are listed, but only abnormal results are displayed)  Labs Reviewed  LIPASE, BLOOD - Abnormal; Notable for the following components:      Result Value   Lipase 200 (*)    All other components within normal limits  COMPREHENSIVE METABOLIC PANEL - Abnormal; Notable for the following components:   Sodium 125 (*)    Chloride 81 (*)    CO2 18 (*)    Glucose, Bld 359 (*)    BUN 50 (*)    Creatinine, Ser 2.46 (*)    Total Protein 8.9 (*)    Total Bilirubin 1.4 (*)    GFR, Estimated 30 (*)    All other components within normal limits  URINALYSIS, ROUTINE W REFLEX MICROSCOPIC - Abnormal; Notable for the following components:   Glucose, UA >=500 (*)    Hgb urine dipstick MODERATE (*)    Ketones, ur 20 (*)    Protein, ur 100 (*)    All other components within normal limits  BLOOD GAS, VENOUS - Abnormal;  Notable for the following components:   pCO2, Ven 38 (*)    Bicarbonate 17.4 (*)    Acid-base deficit 8.8 (*)    All other components within normal limits  TROPONIN I (HIGH SENSITIVITY) - Abnormal; Notable for the following components:   Troponin I (High Sensitivity) 43 (*)    All other components within normal limits  TROPONIN I (HIGH SENSITIVITY) - Abnormal; Notable for the following components:   Troponin I (High Sensitivity) 25 (*)    All other components within normal limits  CBC  CBC  COMPREHENSIVE METABOLIC PANEL   ____________________________________________  EKG   EKG  Interpretation  Date/Time:  Sunday November 02 2022 13:29:19 EST Ventricular Rate:  110 PR Interval:  145 QRS Duration: 150 QT Interval:  386 QTC Calculation: 523 R Axis:   268 Text Interpretation: Sinus tachycardia Right atrial enlargement Right bundle branch block ST changes I, II, aVR, aVL ? rate related Confirmed by Nanda Quinton [phone]) on 11/02/2022 3:04:03 PM        ____________________________________________  RADIOLOGY  CT Renal Stone Study  Result Date: 11/02/2022 CLINICAL DATA:  Abdominal pain, flank stone suspected EXAM: CT ABDOMEN AND PELVIS WITHOUT CONTRAST TECHNIQUE: Multidetector CT imaging of the abdomen and pelvis was performed following the standard protocol without IV contrast. RADIATION DOSE REDUCTION: This exam was performed according to the departmental dose-optimization program which includes automated exposure control, adjustment of the mA and/or kV according to patient size and/or use of iterative reconstruction technique. COMPARISON:  CT abdomen pelvis 01/04/2021 FINDINGS: Lower chest: No acute abnormality. Evaluation abdominal viscera is limited by the lack of IV contrast. Hepatobiliary: No focal liver abnormality is seen. Normal gallbladder. Pancreas: Multiple coarse calcifications noted in the pancreatic head which may be related to recurrent/chronic pancreatitis. No acute inflammatory changes. Spleen: Normal in size without focal abnormality. Adrenals/Urinary Tract: Normal appearance of the right adrenal gland. Stable mild thickening of the left adrenal gland. No hydronephrosis or renal calculi identified. Stomach/Bowel: Scattered colonic stomach is within normal limits. Appendix appears normal. No evidence of bowel wall thickening, distention, or inflammatory changes. Diverticula without evidence of diverticulitis. Vascular/Lymphatic: Aortic atherosclerosis. No enlarged abdominal or pelvic lymph nodes. Reproductive: Prostate is unremarkable. Other: No abdominal wall  hernia or abnormality. No abdominopelvic ascites. Musculoskeletal: No acute or significant osseous findings. IMPRESSION: 1. No acute intra-abdominal or pelvic pathology on a noncontrast exam. No renal calculi or hydronephrosis. 2. Multiple coarse calcifications in the pancreatic head which may be related to recurrent/chronic pancreatitis. No acute inflammatory changes. 3. Diverticula without evidence of diverticulitis. 4. Aortic atherosclerosis. Aortic Atherosclerosis (ICD10-I70.0). Electronically Signed   By: Audie Pinto M.D.   On: 11/02/2022 13:44    ____________________________________________   PROCEDURES  Procedure(s) performed:   Procedures  None  ____________________________________________   INITIAL IMPRESSION / ASSESSMENT AND PLAN / ED COURSE  Pertinent labs & imaging results that were available during my care of the patient were reviewed by me and considered in my medical decision making (see chart for details).   This patient is Presenting for Evaluation of abdominal pain, which does require a range of treatment options, and is a complaint that involves a high risk of morbidity and mortality.  The Differential Diagnoses includes but is not exclusive to acute cholecystitis, intrathoracic causes for epigastric abdominal pain, gastritis, duodenitis, pancreatitis, small bowel or large bowel obstruction, abdominal aortic aneurysm, hernia, gastritis, etc.   Critical Interventions-    Medications  enoxaparin (LOVENOX) injection 30 mg (has no administration in time  range)  acetaminophen (TYLENOL) tablet 650 mg (has no administration in time range)    Or  acetaminophen (TYLENOL) suppository 650 mg (has no administration in time range)  ondansetron (ZOFRAN) tablet 4 mg (has no administration in time range)    Or  ondansetron (ZOFRAN) injection 4 mg (has no administration in time range)  HYDROmorphone (DILAUDID) injection 0.5 mg (0.5 mg Intravenous Given 11/02/22 1726)   pantoprazole (PROTONIX) injection 40 mg (40 mg Intravenous Given 11/02/22 1726)  sodium chloride 0.9 % bolus 1,000 mL (has no administration in time range)  0.9 %  sodium chloride infusion (has no administration in time range)  metoprolol tartrate (LOPRESSOR) injection 5 mg (has no administration in time range)  sodium chloride 0.9 % bolus 500 mL (0 mLs Intravenous Stopped 11/02/22 1629)  0.9 %  sodium chloride infusion (0 mLs Intravenous Stopped 11/02/22 1650)  ondansetron (ZOFRAN) injection 4 mg (4 mg Intravenous Given 11/02/22 1542)  sodium chloride 0.9 % bolus 500 mL (0 mLs Intravenous Stopped 11/02/22 1733)    Reassessment after intervention:  Symptoms improved.    Clinical Laboratory Tests Ordered, included pseudohyponatremia 125.  Patient with acute kidney injury with creatinine now 2.46 with normal creatinine in mid December.  Hyperglycemia without evidence of DKA.  UA without infection.  Mild elevation in troponin but no active CP.   Radiologic Tests Ordered, included CT renal. I independently interpreted the images and agree with radiology interpretation.   Cardiac Monitor Tracing which shows NSR.    Social Determinants of Health Risk patient is a smoker.   Consult complete with Hospitalist, Dr. Olevia Bowens. Plan for admit.   Medical Decision Making: Summary:  Patient presents emergency department for evaluation of nausea and vomiting over the past week.  He is developed an acute kidney injury with mild elevation in lipase although no acute inflammation on noncontrast CT.  Plan for IV fluids.  Suspect prerenal etiology for his AKI.  Low suspicion for ACS with minimally elevated troponin and ST changes on EKG likely rate related. Suspect demand ischemia.   Reevaluation with update and discussion with plan for IVF and admit. Patient in agreement.   Patient's presentation is most consistent with acute presentation with potential threat to life or bodily function.   Disposition:  admit  ____________________________________________  FINAL CLINICAL IMPRESSION(S) / ED DIAGNOSES  Final diagnoses:  AKI (acute kidney injury) ([place])  Nausea and vomiting, unspecified vomiting type    Note:  This document was prepared using Dragon voice recognition software and may include unintentional dictation errors.  Nanda Quinton, MD, [facility] Emergency Medicine    Danni Leabo, [facility], MD 11/02/22 [phone]

## 2022-11-02 NOTE — H&P (Signed)
History and Physical    Patient: [name] RAQ:[id number] DOB: 1966-02-05 DOA: 11/02/2022 DOS: the patient was seen and examined on 11/02/2022 PCP: Delorise Shiner, MD  Patient coming from: Home  Chief Complaint:  Chief Complaint  Patient presents with   Emesis   Back Pain   HPI: [name] is a 57 y.o. 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  He has not being able to hold anything on his stomach.  Has been using his insulin. No diarrhea, constipation, melena or hematochezia.  He denied fever, chills, rhinorrhea, sore throat, wheezing or hemoptysis.  No chest pain, palpitations, diaphoresis, PND, orthopnea or pitting edema of the lower extremities.  No flank pain, dysuria, frequency or hematuria.  No polyuria, polydipsia, polyphagia or blurred vision.   ED course: Initial vital signs were temperature 97.5 F, pulse 119, respiration 15, BP 139/106 mmHg and O2 sat 100% on room air.  He received NS 500 mL bolus x 2, Zofran 4 mg IVP and I added NS 1000 mL bolus over 2 hours.  Lab work: His urinalysis showed glucosuria more than 500, ketonuria 20 and proteinuria 100 mg/dL.  CBC showed a white count 6.1, hemoglobin 16.1 g/dL platelets 153.  First troponin levels 43 ng/L.  Lipase was 200 units/L.  CMP shows a sodium 125, potassium 3.7, chloride 81 and CO2 18 mmol/L.  Total bilirubin 1.4, glucose 359, BUN 50, creatinine 2.46 and calcium 10.3 g/dL.  Total protein 8.9 and albumin 4.6 g/dL.  Alk phos and transaminases are normal.  Imaging: CT renal study with no acute intra-abdominal or pelvic pathology.  No renal calculus or hydronephrosis.  There is multiple  coarse calcification in the pancreatic head which may be related to recurrent/chronic pancreatitis.  No acute laboratory changes.  Diverticulosis without diverticulitis, aortic atherosclerosis.   Review of Systems: As mentioned in the history of present illness. All other systems reviewed and are negative. Past Medical History:  Diagnosis Date   Acid reflux    Concussion    Coronary artery disease    Diabetes (HCC)    ETOH abuse    High cholesterol    Hypertension    MI (myocardial infarction) ([place])    2012   Sleep apnea    USES CPAP AS NEEDED   Tobacco abuse    Past Surgical History:  Procedure Laterality Date   BIOPSY  07/01/2022   Procedure: BIOPSY;  Surgeon: Lavena Bullion, DO;  Location: [facility];  Service: Gastroenterology;;   COLONOSCOPY N/A 07/01/2022   Procedure: COLONOSCOPY;  Surgeon: Lavena Bullion, DO;  Location: [facility];  Service: Gastroenterology;  Laterality: N/A;   CORONARY ARTERY BYPASS GRAFT N/A 07/04/2022   Procedure: CORONARY ARTERY BYPASS GRAFTING (CABG) X4, USING LEFT GREATER SAPHENOUS VEIN.;  Surgeon: Coralie Common, MD;  Location: [facility];  Service: Open Heart Surgery;  Laterality: N/A;   ESOPHAGOGASTRODUODENOSCOPY (EGD) WITH PROPOFOL N/A 07/01/2022   Procedure: ESOPHAGOGASTRODUODENOSCOPY (EGD) WITH PROPOFOL;  Surgeon: Lavena Bullion, DO;  Location: [facility];  Service: Gastroenterology;  Laterality: N/A;   HEMOSTASIS CLIP PLACEMENT  07/01/2022   Procedure: HEMOSTASIS CLIP PLACEMENT;  Surgeon: Lavena Bullion, DO;  Location: [facility];  Service: Gastroenterology;;  KNEE ARTHROSCOPY     ORIF ULNAR FRACTURE Right 12/31/2016   Procedure: OPEN REDUCTION INTERNAL FIXATION (ORIF) both bone forearm fracture;  Surgeon: Roseanne Kaufman, MD;  Location: [facility];  Service: Orthopedics;  Laterality: Right;   POLYPECTOMY  07/01/2022   Procedure: POLYPECTOMY;  Surgeon: Lavena Bullion, DO;  Location: [facility];  Service: Gastroenterology;;    RIGHT/LEFT HEART CATH AND CORONARY ANGIOGRAPHY N/A 06/25/2022   Procedure: RIGHT/LEFT HEART CATH AND CORONARY ANGIOGRAPHY;  Surgeon: [name], MD;  Location: [facility];  Service: Cardiovascular;  Laterality: N/A;   stents     TEE WITHOUT CARDIOVERSION N/A 06/25/2022   Procedure: TRANSESOPHAGEAL ECHOCARDIOGRAM (TEE);  Surgeon: Pixie Casino, MD;  Location: [facility];  Service: Cardiovascular;  Laterality: N/A;   TEE WITHOUT CARDIOVERSION N/A 07/04/2022   Procedure: TRANSESOPHAGEAL ECHOCARDIOGRAM (TEE);  Surgeon: Coralie Common, MD;  Location: [facility];  Service: Open Heart Surgery;  Laterality: N/A;   Social History:  reports that he has been smoking cigarettes. He has a 7.50 pack-year smoking history. He has never used smokeless tobacco. He reports current alcohol use of about 6.0 standard drinks of alcohol per week. He reports that he does not use drugs.  No Known Allergies  Family History  Problem Relation Age of Onset   Diabetes Mother     Prior to Admission medications   Medication Sig Start Date End Date Taking? Authorizing Provider  aspirin 325 MG tablet Take 325 mg by mouth daily. As per patient he is taking 1 tablet of aspirin 350 mg from [facility].   Yes [provider]  ezetimibe (ZETIA) 10 MG tablet Take 1 tablet by mouth daily. 09/08/22  Yes [provider]  insulin glargine-yfgn (SEMGLEE) 100 UNIT/ML Pen INJECT 45 UNITS SUBCUTANEOUSLY DAILY (USE WITHIN 28 DAYS AFTER OPENING PEN) 09/25/22  Yes [provider]  aspirin EC 81 MG tablet Take 1 tablet (81 mg total) by mouth daily. Swallow whole. Patient not taking: Reported on 11/02/2022 07/10/22   Barrett, Lodema Hong, PA-C  carvedilol (COREG) 6.25 MG tablet Take 1 tablet (6.25 mg total) by mouth 2 (two) times daily with a meal. 07/10/22   Barrett, Erin R, PA-C  clopidogrel (PLAVIX) 75 MG tablet Take 1 tablet (75 mg total) by mouth daily. 07/10/22   Coralie Common, MD  empagliflozin (JARDIANCE) 25 MG  TABS tablet Take 25 mg by mouth daily.    [provider]  furosemide (LASIX) 40 MG tablet Take 1 tablet (40 mg total) by mouth daily. 07/10/22 07/10/23  Lyda Jester M, PA-C  glipiZIDE (GLUCOTROL) 5 MG tablet Take 5 mg by mouth daily. 30 minutes prior to a meal    [provider]  insulin glargine (LANTUS) 100 UNIT/ML Solostar Pen Inject 50 Units into the skin at bedtime.    [provider]  metFORMIN (GLUCOPHAGE-XR) 500 MG 24 hr tablet Take 500 mg by mouth 2 (two) times daily with a meal. 06/19/22   [provider]  ondansetron (ZOFRAN) 8 MG tablet Take 4 mg by mouth 3 (three) times daily as needed for nausea. 05/27/22   [provider]  potassium chloride SA (KLOR-CON M) 20 MEQ tablet Take 1 tablet (20 mEq total) by mouth daily. 07/11/22   Coralie Common, MD  rosuvastatin (CRESTOR) 40 MG tablet Take 1 tablet (40 mg total) by mouth daily. 07/11/22   Coralie Common, MD  sacubitril-valsartan (ENTRESTO) 49-51 MG Take 1 tablet by mouth 2 (two) times daily. 10/07/22   Aundra Dubin,  Elby Showers, MD  sildenafil (VIAGRA) 100 MG tablet Take 50 mg by mouth daily as needed for erectile dysfunction.    [provider]  spironolactone (ALDACTONE) 25 MG tablet Take 1 tablet (25 mg total) by mouth daily. 07/11/22   Coralie Common, MD  Varenicline Tartrate, Starter, (CHANTIX STARTING MONTH PAK) 0.5 MG X 11 & 1 MG X 42 TBPK As directed 10/07/22   Larey Dresser, MD    Physical Exam: Vitals:   11/02/22 1321 11/02/22 1327 11/02/22 1328 11/02/22 1515  BP:    (!) 158/100  Pulse:    (!) 111  Resp: 15 12 (!) 21 15  Temp:      TempSrc:      SpO2:    100%   Physical Exam Vitals and nursing note reviewed.  Constitutional:      General: He is awake. He is not in acute distress. HENT:     Head: Normocephalic.     Nose: No rhinorrhea.     Mouth/Throat:     Mouth: Mucous membranes are dry.  Eyes:     General: No scleral icterus.    Pupils: Pupils are equal, round, and  reactive to light.  Neck:     Vascular: No JVD.  Cardiovascular:     Rate and Rhythm: Normal rate and regular rhythm.     Heart sounds: S1 normal and S2 normal.  Pulmonary:     Effort: Pulmonary effort is normal.     Breath sounds: Normal breath sounds.  Chest:     Comments: Midline surgical scar. Abdominal:     General: Bowel sounds are normal. There is no distension.     Palpations: Abdomen is soft.     Tenderness: There is no abdominal tenderness. There is no right CVA tenderness or left CVA tenderness.  Musculoskeletal:     Cervical back: Neck supple.     Right lower leg: No edema.     Left lower leg: No edema.  Skin:    General: Skin is warm and dry.  Neurological:     General: No focal deficit present.     Mental Status: He is alert and oriented to person, place, and time.  Psychiatric:        Mood and Affect: Mood normal.        Behavior: Behavior normal. Behavior is cooperative.   Data Reviewed:  Results are pending, will review when available.  Assessment and Plan: Principal Problem:   AKI (acute kidney injury) ([place]) Observation/telemetry. Continue IV fluids. Hold ARB/ACE. Hold diuretics. Hold metformin and Jardiance. Avoid hypotension. Avoid nephrotoxins. Monitor intake and output. Monitor renal function electrolytes.  Active Problems:   Acute pancreatitis Risk factors: History of EtOH. Use of diuretics. Treatment: Continue time-limited IV fluids. Clear liquid diet. Analgesics as needed. Antiemetics as needed. Pantoprazole 40 mg IVP daily. Follow CBC, CMP and lipase in AM.    Hyponatremia Secondary to diuretic use. Secondary to GI losses. Continue time-limited IV fluids. Continue AKI/pancreatitis treatment. Follow sodium level.    Chronic combined systolic and  diastolic congestive heart failure (HCC)  No signs of decompensation.   The patient is volume depleted.    CAD (coronary artery disease)   S/P CABG x 4 Parental  beta-blocker. Once tolerating diet. Resume ASA, beta-blocker and statin.    Hypertension Metoprolol 5 mg IVP every 8 hours. Switch to oral meds once able to.    Type 2 diabetes mellitus with hyperlipidemia (HCC) Continue Lantus but at 30 units  every evening. CBG monitoring before meals and bedtime.    GERD (gastroesophageal reflux disease) On pantoprazole IV.    Advance Care Planning:   Code Status: Full Code   Consults:   Family Communication:   Severity of Illness: The appropriate patient status for this patient is OBSERVATION. Observation status is judged to be reasonable and necessary in order to provide the required intensity of service to ensure the patient's safety. The patient's presenting symptoms, physical exam findings, and initial radiographic and laboratory data in the context of their medical condition is felt to place them at decreased risk for further clinical deterioration. Furthermore, it is anticipated that the patient will be medically stable for discharge from the hospital within 2 midnights of admission.   Author: Reubin Milan, MD 11/02/2022 4:41 PM  For on call review [email or web address].   This document was prepared using Dragon voice recognition software and may contain some unintended transcription errors.

## 2022-11-02 NOTE — ED Triage Notes (Addendum)
Pt arrived via POV, c/o vomiting, back pain. Zofran at home not working. Hx of CABG

## 2022-11-03 ENCOUNTER — Other Ambulatory Visit: Payer: Self-pay

## 2022-11-03 DIAGNOSIS — Z7984 Long term (current) use of oral hypoglycemic drugs: Secondary | ICD-10-CM | POA: Diagnosis not present

## 2022-11-03 DIAGNOSIS — N179 Acute kidney failure, unspecified: Secondary | ICD-10-CM | POA: Diagnosis present

## 2022-11-03 DIAGNOSIS — I2510 Atherosclerotic heart disease of native coronary artery without angina pectoris: Secondary | ICD-10-CM | POA: Diagnosis not present

## 2022-11-03 DIAGNOSIS — E871 Hypo-osmolality and hyponatremia: Secondary | ICD-10-CM | POA: Diagnosis present

## 2022-11-03 DIAGNOSIS — E876 Hypokalemia: Secondary | ICD-10-CM | POA: Diagnosis present

## 2022-11-03 DIAGNOSIS — K219 Gastro-esophageal reflux disease without esophagitis: Secondary | ICD-10-CM

## 2022-11-03 DIAGNOSIS — I5042 Chronic combined systolic (congestive) and diastolic (congestive) heart failure: Secondary | ICD-10-CM

## 2022-11-03 DIAGNOSIS — Z794 Long term (current) use of insulin: Secondary | ICD-10-CM | POA: Diagnosis not present

## 2022-11-03 DIAGNOSIS — K859 Acute pancreatitis without necrosis or infection, unspecified: Secondary | ICD-10-CM

## 2022-11-03 DIAGNOSIS — I252 Old myocardial infarction: Secondary | ICD-10-CM | POA: Diagnosis not present

## 2022-11-03 DIAGNOSIS — Z833 Family history of diabetes mellitus: Secondary | ICD-10-CM | POA: Diagnosis not present

## 2022-11-03 DIAGNOSIS — Z7902 Long term (current) use of antithrombotics/antiplatelets: Secondary | ICD-10-CM | POA: Diagnosis not present

## 2022-11-03 DIAGNOSIS — E1169 Type 2 diabetes mellitus with other specified complication: Secondary | ICD-10-CM

## 2022-11-03 DIAGNOSIS — I10 Essential (primary) hypertension: Secondary | ICD-10-CM | POA: Diagnosis not present

## 2022-11-03 DIAGNOSIS — Z951 Presence of aortocoronary bypass graft: Secondary | ICD-10-CM

## 2022-11-03 DIAGNOSIS — K860 Alcohol-induced chronic pancreatitis: Secondary | ICD-10-CM | POA: Diagnosis present

## 2022-11-03 DIAGNOSIS — E1165 Type 2 diabetes mellitus with hyperglycemia: Secondary | ICD-10-CM | POA: Diagnosis present

## 2022-11-03 DIAGNOSIS — E785 Hyperlipidemia, unspecified: Secondary | ICD-10-CM

## 2022-11-03 DIAGNOSIS — E7800 Pure hypercholesterolemia, unspecified: Secondary | ICD-10-CM | POA: Diagnosis present

## 2022-11-03 DIAGNOSIS — Z7982 Long term (current) use of aspirin: Secondary | ICD-10-CM | POA: Diagnosis not present

## 2022-11-03 DIAGNOSIS — F1010 Alcohol abuse, uncomplicated: Secondary | ICD-10-CM | POA: Diagnosis present

## 2022-11-03 DIAGNOSIS — I110 Hypertensive heart disease with heart failure: Secondary | ICD-10-CM | POA: Diagnosis present

## 2022-11-03 DIAGNOSIS — Z79899 Other long term (current) drug therapy: Secondary | ICD-10-CM | POA: Diagnosis not present

## 2022-11-03 DIAGNOSIS — F17210 Nicotine dependence, cigarettes, uncomplicated: Secondary | ICD-10-CM | POA: Diagnosis present

## 2022-11-03 DIAGNOSIS — R066 Hiccough: Secondary | ICD-10-CM | POA: Diagnosis present

## 2022-11-03 DIAGNOSIS — K852 Alcohol induced acute pancreatitis without necrosis or infection: Secondary | ICD-10-CM | POA: Diagnosis present

## 2022-11-03 LAB — COMPREHENSIVE METABOLIC PANEL
ALT: 18 U/L (ref 0–44)
AST: 17 U/L (ref 15–41)
Albumin: 3.6 g/dL (ref 3.5–5.0)
Alkaline Phosphatase: 50 U/L (ref 38–126)
Anion gap: 17 — ABNORMAL HIGH (ref 5–15)
BUN: 37 mg/dL — ABNORMAL HIGH (ref 6–20)
CO2: 19 mmol/L — ABNORMAL LOW (ref 22–32)
Calcium: 9.2 mg/dL (ref 8.9–10.3)
Chloride: 92 mmol/L — ABNORMAL LOW (ref 98–111)
Creatinine, Ser: 1.85 mg/dL — ABNORMAL HIGH (ref 0.61–1.24)
GFR, Estimated: 42 mL/min — ABNORMAL LOW (ref >60)
Glucose, Bld: 268 mg/dL — ABNORMAL HIGH (ref 70–99)
Potassium: 3.2 mmol/L — ABNORMAL LOW (ref 3.5–5.1)
Sodium: 128 mmol/L — ABNORMAL LOW (ref 135–145)
Total Bilirubin: 1.2 mg/dL (ref 0.3–1.2)
Total Protein: 7.3 g/dL (ref 6.5–8.1)

## 2022-11-03 LAB — CBC
HCT: 39.3 % (ref 39.0–52.0)
Hemoglobin: 14 g/dL (ref 13.0–17.0)
MCH: 32.5 pg (ref 26.0–34.0)
MCHC: 35.6 g/dL (ref 30.0–36.0)
MCV: 91.2 fL (ref 80.0–100.0)
Platelets: 138 10*3/uL — ABNORMAL LOW (ref 150–400)
RBC: 4.31 MIL/uL (ref 4.22–5.81)
RDW: 13.8 % (ref 11.5–15.5)
WBC: 4.9 10*3/uL (ref 4.0–10.5)
nRBC: 0 % (ref 0.0–0.2)

## 2022-11-03 LAB — GLUCOSE, CAPILLARY
Glucose-Capillary: 190 mg/dL — ABNORMAL HIGH (ref 70–99)
Glucose-Capillary: 237 mg/dL — ABNORMAL HIGH (ref 70–99)

## 2022-11-03 MED ORDER — PANTOPRAZOLE SODIUM 40 MG IV SOLR
40.0000 mg | Freq: Two times a day (BID) | INTRAVENOUS | Status: DC
  Administered 2022-11-03 – 2022-11-04 (×3): 40 mg via INTRAVENOUS
  Filled 2022-11-03 (×3): qty 10

## 2022-11-03 MED ORDER — NICOTINE 7 MG/24HR TD PT24
7.0000 mg | MEDICATED_PATCH | Freq: Every day | TRANSDERMAL | Status: DC
  Filled 2022-11-03 (×3): qty 1

## 2022-11-03 MED ORDER — INSULIN ASPART 100 UNIT/ML IJ SOLN
0.0000 [IU] | Freq: Every day | INTRAMUSCULAR | Status: DC
  Administered 2022-11-03 – 2022-11-04 (×2): 2 [IU] via SUBCUTANEOUS

## 2022-11-03 MED ORDER — LORAZEPAM 1 MG PO TABS: 1.0000 mg | ORAL_TABLET | ORAL | Status: DC | PRN

## 2022-11-03 MED ORDER — ADULT MULTIVITAMIN W/MINERALS CH
1.0000 | ORAL_TABLET | Freq: Every day | ORAL | Status: DC
  Administered 2022-11-03 – 2022-11-05 (×3): 1 via ORAL
  Filled 2022-11-03 (×3): qty 1

## 2022-11-03 MED ORDER — THIAMINE MONONITRATE 100 MG PO TABS
100.0000 mg | ORAL_TABLET | Freq: Every day | ORAL | Status: DC
  Administered 2022-11-03 – 2022-11-05 (×3): 100 mg via ORAL
  Filled 2022-11-03 (×3): qty 1

## 2022-11-03 MED ORDER — SODIUM CHLORIDE 0.9 % IV SOLN: 12.5000 mg | Freq: Four times a day (QID) | INTRAVENOUS | Status: DC | PRN

## 2022-11-03 MED ORDER — FOLIC ACID 1 MG PO TABS
1.0000 mg | ORAL_TABLET | Freq: Every day | ORAL | Status: DC
  Administered 2022-11-03 – 2022-11-05 (×3): 1 mg via ORAL
  Filled 2022-11-03 (×3): qty 1

## 2022-11-03 MED ORDER — SODIUM CHLORIDE 0.9 % IV SOLN: INTRAVENOUS | Status: DC

## 2022-11-03 MED ORDER — THIAMINE HCL 100 MG/ML IJ SOLN
100.0000 mg | Freq: Every day | INTRAMUSCULAR | Status: DC
  Filled 2022-11-03: qty 2

## 2022-11-03 MED ORDER — OXYCODONE-ACETAMINOPHEN 5-325 MG PO TABS
1.0000 | ORAL_TABLET | Freq: Four times a day (QID) | ORAL | Status: DC | PRN
  Administered 2022-11-03 (×2): 1 via ORAL
  Filled 2022-11-03 (×2): qty 1

## 2022-11-03 MED ORDER — POTASSIUM CHLORIDE 10 MEQ/100ML IV SOLN
10.0000 meq | INTRAVENOUS | Status: AC
  Administered 2022-11-03 (×4): 10 meq via INTRAVENOUS
  Filled 2022-11-03: qty 100

## 2022-11-03 MED ORDER — ENOXAPARIN SODIUM 40 MG/0.4ML IJ SOSY
40.0000 mg | PREFILLED_SYRINGE | INTRAMUSCULAR | Status: DC
  Administered 2022-11-03 – 2022-11-04 (×2): 40 mg via SUBCUTANEOUS
  Filled 2022-11-03 (×2): qty 0.4

## 2022-11-03 MED ORDER — HYDROMORPHONE HCL 1 MG/ML IJ SOLN: 0.5000 mg | INTRAMUSCULAR | Status: DC | PRN

## 2022-11-03 MED ORDER — INSULIN ASPART 100 UNIT/ML IJ SOLN
0.0000 [IU] | Freq: Three times a day (TID) | INTRAMUSCULAR | Status: DC
  Administered 2022-11-03 – 2022-11-04 (×2): 2 [IU] via SUBCUTANEOUS
  Administered 2022-11-04: 7 [IU] via SUBCUTANEOUS

## 2022-11-03 MED ORDER — HYDRALAZINE HCL 20 MG/ML IJ SOLN
10.0000 mg | Freq: Four times a day (QID) | INTRAMUSCULAR | Status: DC | PRN
  Administered 2022-11-03: 10 mg via INTRAVENOUS
  Filled 2022-11-03: qty 1

## 2022-11-03 NOTE — TOC CM/SW Note (Signed)
Transition of Care [facility]) Screening Note  Patient Details  Name: Nashid Purvis Sidle Date of Birth: 18-Dec-1965  Transition of Care [facility]) CM/SW Contact:    Sherie Don, LCSW Phone Number: 11/03/2022, 9:31 AM  Transition of Care Department [facility]) has reviewed patient and no TOC needs have been identified at this time. We will continue to monitor patient advancement through interdisciplinary progression rounds. If new patient transition needs arise, please place a TOC consult.

## 2022-11-03 NOTE — Hospital Course (Addendum)
[name] is a 57 y.o. male with medical history significant of GERD, concussion, CAD, NSTEMI in 2012, chronic combined systolic and diastolic heart failure with an EF of 40 to 45%, type 2 diabetes mellitus, history of EtOH abuse in remission, hyperlipidemia, hypertension, sleep apnea, tobacco abuse in remission presented to the emergency department complaints of back pain and emesis a week.  He was not able to hold anything down but was still using his insulin.  In the ED, patient was tachycardic and slightly hypertensive.  Labs showed glycosuria in the urine with ketonuria.  CBC was unremarkable.  Chemistry showed hyponatremia with sodium of 125 creatinine elevated at 2.4.  CT scan of the abdomen pelvis without any acute findings or hydronephrosis.  Coarse calcification in the pancreas were noted.  Patient received normal saline bolus and was admitted hospital for acute kidney injury, hyponatremia.    AKI (acute kidney injury) ([place]) Continue to hold ACE ARB, diuretic metformin and Jardiance.  Sodium today at 128.  Continue IV hydration.      Acute pancreatitis on chronic pancreatitis Patient had emesis abdominal pain and lipase was elevated at 200.  Counseling against alcohol was done.  Continue IV fluids antiemetics and supportive care.  On clears.  Continue Protonix. Lipase on admission was 200.     Hyponatremia Likely secondary to emesis, diuretic use.  Continue IV fluids.  Initial sodium level was 125.  Will continue to monitor.  Sodium is slightly improved to 128 today.  Hypokalemia.  Potassium 3.2 today.  Will replenish with IV KCl.     Chronic combined systolic and  diastolic congestive heart failure ([place])  Patient was volume depleted on presentation.  On normal saline at this time.     CAD (coronary artery disease)   S/P CABG x 4 Aspirin beta-blocker and statins have been resumed.  Continue to monitor.  No acute issues.     Hypertension On metoprolol IV.     Type 2  diabetes mellitus with hyperlipidemia (HCC) Continue Lantus, sliding scale insulin.     GERD (gastroesophageal reflux disease) On pantoprazole IV.

## 2022-11-03 NOTE — Progress Notes (Addendum)
PROGRESS NOTE    [name]  ERX:[id number] DOB: 03-Jul-1966 DOA: 11/02/2022 PCP: Delorise Shiner, MD    Brief Narrative:  [name] is a 57 y.o. male with medical history significant of GERD, concussion, CAD, NSTEMI in 2012, chronic combined systolic and diastolic heart failure with an EF of 40 to 45%, type 2 diabetes mellitus, history of EtOH abuse in remission, hyperlipidemia, hypertension, sleep apnea, tobacco abuse in remission presented to the emergency department complaints of back pain and emesis a week.  He was not able to hold anything down but was still using his insulin.  In the ED, patient was tachycardic and slightly hypertensive.  Labs showed glycosuria in the urine with ketonuria.  CBC was unremarkable.  Chemistry showed hyponatremia with sodium of 125 creatinine elevated at 2.4.  CT scan of the abdomen pelvis without any acute findings or hydronephrosis.  Coarse calcification in the pancreas were noted.  Patient received normal saline bolus and was admitted hospital for acute kidney injury, hyponatremia.    Assessment and plan  AKI (acute kidney injury) ([place]) Continue to hold lisinopril, Entresto, spironolactone, metformin and Jardiance.  Creatinine today at 1.8 from initial 2.4.  Continue IV hydration.  Sodium today at 128.  Will closely monitor BMP.    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  Counseling against alcohol was done.  Continue IV fluids antiemetics and supportive care.  On clears.  Continue Protonix. Lipase on admission was 200.  CT scan of the abdomen showed a course pancreatic head calcifications.  Patient does have history of alcohol abuse and last drink was new year.  Patient has been feeling sicker since then.  Will put the patient on CIWA protocol with the p.o. Ativan.    History of alcohol  abuse with possible mild withdrawal.  Will put the patient on CIWA protocol for  now.  Has been going through increased symptoms since his last drink new year.    Hyponatremia Likely secondary to emesis, diuretic use.  Continue IV fluids.  Initial sodium level was 125.  Will continue to monitor.  Sodium is slightly improved to 128 today.  Check BMP in AM.  Hypokalemia.  Potassium 3.2 today.  Will replenish with IV KCl.  Check BMP in AM.   History of chronic combined systolic and  diastolic congestive heart failure ([place])  Patient was volume depleted on presentation.  On normal saline at this time.  Watch closely for fluid overload.  Appears compensated at this time.     CAD (coronary artery disease)   S/P CABG x 4 Aspirin beta-blocker and statins have been resumed.  Continue to monitor.  No acute issues.     Hypertension On metoprolol IV.     Type 2 diabetes mellitus with hyperlipidemia (HCC) Continue Lantus, sliding scale insulin.  Has been started on clears.     GERD (gastroesophageal reflux disease) On pantoprazole IV.  Will change to IV twice daily.  Intractable hiccups.  Will add Thorazine as needed.  Will give 1 dose of Reglan, change PPI twice daily.    DVT prophylaxis: enoxaparin (LOVENOX) injection 30 mg Start: 11/02/22 2200   Code Status:     Code Status: Full Code  Disposition: Home likely in 2-3days  Status is: Inpatient  Remains inpatient appropriate because: Acute kidney injury, acute on chronic pancreatitis, IV narcotics, IV fluids, intractable hiccups   Family Communication: None at bedside.  Consultants:  None  Procedures:  None  Antimicrobials:  None  Anti-infectives (From admission, onward)    None       Subjective: Today, patient was seen and examined at bedside.  Complains of intractable hiccups, has been drinking some clears.  Has not had food since last Sunday for almost 8 days now.  Last bowel movement was 7 days back.  Has not been able to tolerate oral diet.  Able to hold some liquids at this time.  Patient did  have some hallucinations tremors sweating initially after he quit alcohol during New Year's.  He has been having more symptoms since then.  Objective: Vitals:   11/03/22 0338 11/03/22 0552 11/03/22 0711 11/03/22 0905  BP:  (!) 161/94 (!) 156/95 (!) 162/96  Pulse:  96 81 79  Resp:   16 17  Temp:   98.1 F (36.7 C) 98 F (36.7 C)  TempSrc:   Oral Oral  SpO2:   100% 100%  Weight: 70.3 kg     Height: 6' (1.829 m)       Intake/Output Summary (Last 24 hours) at 11/03/2022 1110 Last data filed at 11/03/2022 1000 Gross per 24 hour  Intake 2096.11 ml  Output 720 ml  Net 1376.11 ml   Filed Weights   11/03/22 0338  Weight: 70.3 kg    Physical Examination: Body mass index is 21.02 kg/m.  General:  Average built, not in obvious distress, constant hiccups HENT:   No scleral pallor or icterus noted. Oral mucosa is moist.  Chest:    Diminished breath sounds bilaterally. No crackles or wheezes.  CVS: S1 &S2 heard. No murmur.  Regular rate and rhythm. Abdomen: Soft, nontender, nondistended.  Bowel sounds are heard.   Extremities: No cyanosis, clubbing or edema.  Peripheral pulses are palpable. Psych: Alert, awake and oriented, normal mood CNS:  No cranial nerve deficits.  Power equal in all extremities.   Skin: Warm and dry.  No rashes noted.  Data Reviewed:   CBC: Recent Labs  Lab 11/02/22 1326 11/03/22 0525  WBC 6.1 4.9  HGB 16.1 14.0  HCT 45.8 39.3  MCV 91.6 91.2  PLT 153 138*    Basic Metabolic Panel: Recent Labs  Lab 11/02/22 1326 11/03/22 0525  NA 125* 128*  K 3.7 3.2*  CL 81* 92*  CO2 18* 19*  GLUCOSE 359* 268*  BUN 50* 37*  CREATININE 2.46* 1.85*  CALCIUM 10.3 9.2    Liver Function Tests: Recent Labs  Lab 11/02/22 1326 11/03/22 0525  AST 22 17  ALT 24 18  ALKPHOS 64 50  BILITOT 1.4* 1.2  PROT 8.9* 7.3  ALBUMIN 4.6 3.6     Radiology Studies: CT Renal Stone Study  Result Date: 11/02/2022 CLINICAL DATA:  Abdominal pain, flank stone suspected  EXAM: CT ABDOMEN AND PELVIS WITHOUT CONTRAST TECHNIQUE: Multidetector CT imaging of the abdomen and pelvis was performed following the standard protocol without IV contrast. RADIATION DOSE REDUCTION: This exam was performed according to the departmental dose-optimization program which includes automated exposure control, adjustment of the mA and/or kV according to patient size and/or use of iterative reconstruction technique. COMPARISON:  CT abdomen pelvis 01/04/2021 FINDINGS: Lower chest: No acute abnormality. Evaluation abdominal viscera is limited by the lack of IV contrast. Hepatobiliary: No focal liver abnormality is seen. Normal gallbladder. Pancreas: Multiple coarse calcifications noted in the pancreatic head which may be related to recurrent/chronic pancreatitis. No acute inflammatory changes. Spleen: Normal in size without focal abnormality. Adrenals/Urinary Tract: Normal appearance of the  right adrenal gland. Stable mild thickening of the left adrenal gland. No hydronephrosis or renal calculi identified. Stomach/Bowel: Scattered colonic stomach is within normal limits. Appendix appears normal. No evidence of bowel wall thickening, distention, or inflammatory changes. Diverticula without evidence of diverticulitis. Vascular/Lymphatic: Aortic atherosclerosis. No enlarged abdominal or pelvic lymph nodes. Reproductive: Prostate is unremarkable. Other: No abdominal wall hernia or abnormality. No abdominopelvic ascites. Musculoskeletal: No acute or significant osseous findings. IMPRESSION: 1. No acute intra-abdominal or pelvic pathology on a noncontrast exam. No renal calculi or hydronephrosis. 2. Multiple coarse calcifications in the pancreatic head which may be related to recurrent/chronic pancreatitis. No acute inflammatory changes. 3. Diverticula without evidence of diverticulitis. 4. Aortic atherosclerosis. Aortic Atherosclerosis (ICD10-I70.0). Electronically Signed   By: Audie Pinto M.D.   On:  11/02/2022 13:44      LOS: 0 days    Flora Lipps, MD [facility] Available via Epic secure chat 7am-7pm After these hours, please refer to coverage provider listed on [email or web address] 11/03/2022, 11:10 AM

## 2022-11-04 DIAGNOSIS — E1169 Type 2 diabetes mellitus with other specified complication: Secondary | ICD-10-CM | POA: Diagnosis not present

## 2022-11-04 DIAGNOSIS — I10 Essential (primary) hypertension: Secondary | ICD-10-CM | POA: Diagnosis not present

## 2022-11-04 DIAGNOSIS — N179 Acute kidney failure, unspecified: Secondary | ICD-10-CM | POA: Diagnosis not present

## 2022-11-04 DIAGNOSIS — I2510 Atherosclerotic heart disease of native coronary artery without angina pectoris: Secondary | ICD-10-CM | POA: Diagnosis not present

## 2022-11-04 LAB — BASIC METABOLIC PANEL
Anion gap: 8 (ref 5–15)
BUN: 16 mg/dL (ref 6–20)
CO2: 22 mmol/L (ref 22–32)
Calcium: 7.5 mg/dL — ABNORMAL LOW (ref 8.9–10.3)
Chloride: 102 mmol/L (ref 98–111)
Creatinine, Ser: 0.95 mg/dL (ref 0.61–1.24)
GFR, Estimated: >60 mL/min (ref >60)
Glucose, Bld: 73 mg/dL (ref 70–99)
Potassium: 2.4 mmol/L — CL (ref 3.5–5.1)
Sodium: 132 mmol/L — ABNORMAL LOW (ref 135–145)

## 2022-11-04 LAB — GLUCOSE, CAPILLARY
Glucose-Capillary: 134 mg/dL — ABNORMAL HIGH (ref 70–99)
Glucose-Capillary: 168 mg/dL — ABNORMAL HIGH (ref 70–99)
Glucose-Capillary: 312 mg/dL — ABNORMAL HIGH (ref 70–99)
Glucose-Capillary: 203 mg/dL — ABNORMAL HIGH (ref 70–99)

## 2022-11-04 LAB — CBC
HCT: 34.4 % — ABNORMAL LOW (ref 39.0–52.0)
Hemoglobin: 12.3 g/dL — ABNORMAL LOW (ref 13.0–17.0)
MCH: 32.8 pg (ref 26.0–34.0)
MCHC: 35.8 g/dL (ref 30.0–36.0)
MCV: 91.7 fL (ref 80.0–100.0)
Platelets: 141 10*3/uL — ABNORMAL LOW (ref 150–400)
RBC: 3.75 MIL/uL — ABNORMAL LOW (ref 4.22–5.81)
RDW: 13.5 % (ref 11.5–15.5)
WBC: 4.4 10*3/uL (ref 4.0–10.5)
nRBC: 0 % (ref 0.0–0.2)

## 2022-11-04 LAB — MAGNESIUM: Magnesium: 1.3 mg/dL — ABNORMAL LOW (ref 1.7–2.4)

## 2022-11-04 MED ORDER — CARVEDILOL 25 MG PO TABS
25.0000 mg | ORAL_TABLET | Freq: Two times a day (BID) | ORAL | Status: DC
  Administered 2022-11-04 – 2022-11-05 (×2): 25 mg via ORAL
  Filled 2022-11-04 (×2): qty 1

## 2022-11-04 MED ORDER — POTASSIUM CHLORIDE 10 MEQ/100ML IV SOLN
10.0000 meq | INTRAVENOUS | Status: AC
  Administered 2022-11-04 (×4): 10 meq via INTRAVENOUS
  Filled 2022-11-04 (×4): qty 100

## 2022-11-04 MED ORDER — ASPIRIN 81 MG PO TBEC
81.0000 mg | DELAYED_RELEASE_TABLET | Freq: Every day | ORAL | Status: DC
  Administered 2022-11-04 – 2022-11-05 (×2): 81 mg via ORAL
  Filled 2022-11-04 (×2): qty 1

## 2022-11-04 MED ORDER — POTASSIUM CHLORIDE 20 MEQ PO PACK
60.0000 meq | PACK | Freq: Once | ORAL | Status: AC
  Administered 2022-11-04: 60 meq via ORAL
  Filled 2022-11-04: qty 3

## 2022-11-04 MED ORDER — CLOPIDOGREL BISULFATE 75 MG PO TABS
75.0000 mg | ORAL_TABLET | Freq: Every day | ORAL | Status: DC
  Administered 2022-11-04 – 2022-11-05 (×2): 75 mg via ORAL
  Filled 2022-11-04 (×2): qty 1

## 2022-11-04 MED ORDER — MAGNESIUM SULFATE 4 GM/100ML IV SOLN
4.0000 g | Freq: Once | INTRAVENOUS | Status: AC
  Administered 2022-11-04: 4 g via INTRAVENOUS
  Filled 2022-11-04: qty 100

## 2022-11-04 NOTE — TOC Initial Note (Signed)
Transition of Care [facility]) - Initial/Assessment Note   Patient Details  Name: [name] MRN: [id number] Date of Birth: 30-Jan-1966  Transition of Care [facility]) CM/SW Contact:    Sherie Don, LCSW Phone Number: 11/04/2022, 12:52 PM  Clinical Narrative: [facility] consulted for ETOH use resources. CSW spoke with patient regarding consult. Patient declined ETOH use resources at this time. TOC signing off, but can be consulted again if needed.  Expected Discharge Plan: Home/Self Care Barriers to Discharge: Continued Medical Work up  Patient Goals and CMS Choice Choice offered to / list presented to : NA  Expected Discharge Plan and Services In-house Referral: Clinical Social Work Post Acute Care Choice: NA Living arrangements for the past 2 months: Apartment            DME Arranged: N/A DME Agency: NA  Prior Living Arrangements/Services Living arrangements for the past 2 months: Apartment Patient language and need for interpreter reviewed:: Yes Do you feel safe going back to the place where you live?: Yes      Need for Family Participation in Patient Care: No (Comment) Care giver support system in place?: Yes (comment) Criminal Activity/Legal Involvement Pertinent to Current Situation/Hospitalization: No - Comment as needed  Activities of Daily Living Home Assistive Devices/Equipment: Eyeglasses ADL Screening (condition at time of admission) Patient's cognitive ability adequate to safely complete daily activities?: Yes Is the patient deaf or have difficulty hearing?: No Does the patient have difficulty seeing, even when wearing glasses/contacts?: No Does the patient have difficulty concentrating, remembering, or making decisions?: No Patient able to express need for assistance with ADLs?: Yes Does the patient have difficulty dressing or bathing?: No Independently performs ADLs?: Yes (appropriate for developmental age) Does the patient have difficulty walking or climbing stairs?:  No Weakness of Legs: None Weakness of Arms/Hands: None  Emotional Assessment Attitude/Demeanor/Rapport: Engaged Affect (typically observed): Appropriate Orientation: : Oriented to Self, Oriented to Place, Oriented to  Time, Oriented to Situation Alcohol / Substance Use: Alcohol Use  Admission diagnosis:  AKI (acute kidney injury) ([place]) [N17.9] Nausea and vomiting, unspecified vomiting type [R11.2] Patient Active Problem List   Diagnosis Date Noted   AKI (acute kidney injury) ([place]) 11/02/2022   Chronic combined systolic and diastolic congestive heart failure ([place]) 11/02/2022   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   Hiatal hernia    Adenomatous polyp of ascending colon    Adenomatous polyp of transverse colon    Grade I internal hemorrhoids    Diverticulosis of colon without hemorrhage    Folate deficiency 06/30/2022   Acute congestive heart failure ([place]) 06/30/2022   Elevated troponin 06/30/2022   Left upper lobe pneumonia 06/27/2022   Iron deficiency anemia 70/96/2836   Acute systolic heart failure (HCC)    Localized swelling of right lower extremity 06/24/2022   HLD (hyperlipidemia) 06/24/2022   Microcytic anemia 06/24/2022   Hypomagnesemia 06/24/2022   Acute pancreatitis 04/04/2020   Forearm fracture, right, closed, initial encounter 12/31/2016   Hypokalemia 12/31/2016   Closed displaced comminuted fracture of shaft of right radius    Fall at home, initial encounter    Hyponatremia 08/22/2015   GERD (gastroesophageal reflux disease) 08/22/2015   Tobacco abuse 08/22/2015   ETOH abuse 08/22/2015   Chest pain at rest 08/22/2015   Type 2 diabetes mellitus with hyperlipidemia ([place])    Pancreatitis 01/05/2013   CAD (coronary artery disease) 01/05/2013   Hypertension 01/05/2013   Hyperglycemia 01/05/2013   Elevated LFTs 01/05/2013   PCP:  Delorise Shiner,  MD Pharmacy:   [id number]: [phone]:  [phone] Social Determinants of Health (SDOH) Social History: [place] No Food Insecurity (11/03/2022)  Housing: Low Risk  (11/03/2022)  Transportation Needs: No Transportation Needs (11/03/2022)  Utilities: Not At Risk (11/03/2022)  Tobacco Use: High Risk (11/02/2022)   SDOH Interventions:    Readmission Risk Interventions    11/04/2022   12:50 PM  Readmission Risk Prevention Plan  HRI or Home Care Consult Complete  Social Work Consult for [place] Complete  Palliative Care Screening Not Applicable

## 2022-11-04 NOTE — Progress Notes (Addendum)
PROGRESS NOTE    [name]  SJG:[id number] DOB: December 16, 1965 DOA: 11/02/2022 PCP: Delorise Shiner, MD    Brief Narrative:  [name] is a 57 y.o. male with medical history significant of GERD, concussion, CAD, NSTEMI in 2012, chronic combined systolic and diastolic heart failure with an EF of 40 to 45%, type 2 diabetes mellitus, history of EtOH abuse in remission, hyperlipidemia, hypertension, sleep apnea, tobacco abuse in remission presented to the emergency department complaints of back pain and emesis a week.  He was not able to hold anything down but was still using his insulin.  In the ED, patient was tachycardic and slightly hypertensive.  Labs showed glycosuria in the urine with ketonuria.  CBC was unremarkable.  Chemistry showed hyponatremia with sodium of 125 creatinine elevated at 2.4.  CT scan of the abdomen pelvis without any acute findings or hydronephrosis.  Coarse calcification in the pancreas were noted.  Patient received normal saline bolus and was admitted hospital for acute kidney injury, hyponatremia.    Assessment and plan  AKI (acute kidney injury) ([place]) Continue to hold lisinopril, Entresto, spironolactone, metformin and Jardiance.  Creatinine today at 0.9 from initial 2.4.    Sodium today at 132 from 128.  Continue IV hydration.  BMP in AM.  Consider resuming some of the medications by tomorrow.    Acute on likely chronic alcoholic pancreatitis Patient had emesis, abdominal pain and lipase was elevated at 200.  Continue IV fluids antiemetics and supportive care.  On clears.  Patient has tolerated clears in has less hiccups so will advance to full liquid this morning.  Continue Protonix. Lipase on admission was 200.  CT scan of the abdomen showed a coarse pancreatic head calcifications.  Patient does have history of alcohol abuse and last drink was new year.   Will continue CIWA protocol with the p.o. Ativan.  Patient feels hungry today.    History of alcohol   abuse with possible mild withdrawal.  Continue CIWA protocol for now.  No further worsening of symptoms.    Hyponatremia Likely secondary to emesis, diuretic use.  Improved after IV fluids.  Sodium level today at 132.   Hypomagnesemia.  Will replace with magnesium sulfate 4 g today.  Check levels in AM.  Hypokalemia.  Potassium today at 2.4.  Continue IV KCl and oral KCl today.  Will aggressively replace.  Check levels in AM.    History of chronic combined systolic and  diastolic congestive heart failure  Patient was volume depleted on presentation.  On normal saline. Watch closely for fluid overload.  Appears compensated at this time.  Will reassess volume status in AM.     CAD (coronary artery disease)   S/P CABG x 4 Aspirin beta-blocker and statins have been resumed.  Continue to monitor.  No acute issues.  Resume Plavix as well.     Hypertension Restart Coreg from home.     Type 2 diabetes mellitus with hyperglycemia. Continue Lantus, sliding scale insulin.  Will advance to full liquids, hemoglobin A1c was 7.9, 4 months ago.     GERD (gastroesophageal reflux disease) On pantoprazole IV.  Will continue.  Intractable hiccups.  On Thorazine as needed.  Hiccups have improved compared to yesterday.   DVT prophylaxis: enoxaparin (LOVENOX) injection 40 mg Start: 11/03/22 2200   Code Status:     Code Status: Full Code  Disposition: Home likely in 1 to 2 days. Status is: Inpatient  Remains inpatient appropriate because: Acute kidney injury,  acute on chronic pancreatitis, IV narcotics, IV fluids, intractable hiccups, significant electrolyte imbalance   Family Communication: None at bedside.  Consultants:  None  Procedures:  None  Antimicrobials:  None  Anti-infectives (From admission, onward)    None       Subjective: Today, patient was seen and examined at bedside.  Feels a little hungry today.  Wishes to be advanced on the diet.  Denies any nausea vomiting  fever chills or rigor.  Hiccups have improved.  Denies any shortness of breath chest pain palpitation.  Denies any hallucinations diaphoresis tremors.  Objective: Vitals:   11/03/22 2100 11/04/22 0002 11/04/22 0600 11/04/22 0802  BP: (!) 157/98 (!) 142/93 (!) 154/98 (!) 178/108  Pulse:  89 89 85  Resp:  17 18   Temp:  98.7 F (37.1 C) 98.6 F (37 C) 98.5 F (36.9 C)  TempSrc:  Oral Oral Oral  SpO2:  100% 100% 100%  Weight:      Height:        Intake/Output Summary (Last 24 hours) at 11/04/2022 1119 Last data filed at 11/04/2022 0930 Gross per 24 hour  Intake 2770.39 ml  Output 2550 ml  Net 220.39 ml    Filed Weights   11/03/22 0338  Weight: 70.3 kg    Physical Examination: Body mass index is 21.02 kg/m.   General: Alert awake in Communicative, not in obvious distress, mild hiccups. HENT:   No scleral pallor or icterus noted. Oral mucosa is moist.  Chest:    Clear on auscultation, diminished breath sounds bilaterally. No crackles or wheezes.  CVS: S1 &S2 heard. No murmur.  Regular rate and rhythm. Abdomen: Soft, nontender, nondistended.  Bowel sounds are heard.   Extremities: No cyanosis, clubbing or edema.  Peripheral pulses are palpable. Psych: Alert, awake and oriented, normal mood CNS:  No cranial nerve deficits.  Power equal in all extremities.   Skin: Warm and dry.  No rashes noted.  Data Reviewed:   CBC: Recent Labs  Lab 11/02/22 1326 11/03/22 0525 11/04/22 0433  WBC 6.1 4.9 4.4  HGB 16.1 14.0 12.3*  HCT 45.8 39.3 34.4*  MCV 91.6 91.2 91.7  PLT 153 138* 141*     Basic Metabolic Panel: Recent Labs  Lab 11/02/22 1326 11/03/22 0525 11/04/22 0433  NA 125* 128* 132*  K 3.7 3.2* 2.4*  CL 81* 92* 102  CO2 18* 19* 22  GLUCOSE 359* 268* 73  BUN 50* 37* 16  CREATININE 2.46* 1.85* 0.95  CALCIUM 10.3 9.2 7.5*  MG  --   --  1.3*     Liver Function Tests: Recent Labs  Lab 11/02/22 1326 11/03/22 0525  AST 22 17  ALT 24 18  ALKPHOS 64 50   BILITOT 1.4* 1.2  PROT 8.9* 7.3  ALBUMIN 4.6 3.6      Radiology Studies: CT Renal Stone Study  Result Date: 11/02/2022 CLINICAL DATA:  Abdominal pain, flank stone suspected EXAM: CT ABDOMEN AND PELVIS WITHOUT CONTRAST TECHNIQUE: Multidetector CT imaging of the abdomen and pelvis was performed following the standard protocol without IV contrast. RADIATION DOSE REDUCTION: This exam was performed according to the departmental dose-optimization program which includes automated exposure control, adjustment of the mA and/or kV according to patient size and/or use of iterative reconstruction technique. COMPARISON:  CT abdomen pelvis 01/04/2021 FINDINGS: Lower chest: No acute abnormality. Evaluation abdominal viscera is limited by the lack of IV contrast. Hepatobiliary: No focal liver abnormality is seen. Normal gallbladder. Pancreas: Multiple coarse calcifications  noted in the pancreatic head which may be related to recurrent/chronic pancreatitis. No acute inflammatory changes. Spleen: Normal in size without focal abnormality. Adrenals/Urinary Tract: Normal appearance of the right adrenal gland. Stable mild thickening of the left adrenal gland. No hydronephrosis or renal calculi identified. Stomach/Bowel: Scattered colonic stomach is within normal limits. Appendix appears normal. No evidence of bowel wall thickening, distention, or inflammatory changes. Diverticula without evidence of diverticulitis. Vascular/Lymphatic: Aortic atherosclerosis. No enlarged abdominal or pelvic lymph nodes. Reproductive: Prostate is unremarkable. Other: No abdominal wall hernia or abnormality. No abdominopelvic ascites. Musculoskeletal: No acute or significant osseous findings. IMPRESSION: 1. No acute intra-abdominal or pelvic pathology on a noncontrast exam. No renal calculi or hydronephrosis. 2. Multiple coarse calcifications in the pancreatic head which may be related to recurrent/chronic pancreatitis. No acute inflammatory  changes. 3. Diverticula without evidence of diverticulitis. 4. Aortic atherosclerosis. Aortic Atherosclerosis (ICD10-I70.0). Electronically Signed   By: Audie Pinto M.D.   On: 11/02/2022 13:44      LOS: 1 day    Flora Lipps, MD [facility] Available via Epic secure chat 7am-7pm After these hours, please refer to coverage provider listed on [email or web address] 11/04/2022, 11:19 AM

## 2022-11-04 NOTE — Progress Notes (Signed)
Patient has potassium level of  2.4 this morning. Dx with AKI

## 2022-11-05 DIAGNOSIS — I2510 Atherosclerotic heart disease of native coronary artery without angina pectoris: Secondary | ICD-10-CM | POA: Diagnosis not present

## 2022-11-05 DIAGNOSIS — E1169 Type 2 diabetes mellitus with other specified complication: Secondary | ICD-10-CM | POA: Diagnosis not present

## 2022-11-05 DIAGNOSIS — I10 Essential (primary) hypertension: Secondary | ICD-10-CM | POA: Diagnosis not present

## 2022-11-05 DIAGNOSIS — N179 Acute kidney failure, unspecified: Secondary | ICD-10-CM | POA: Diagnosis not present

## 2022-11-05 LAB — CBC
HCT: 35.7 % — ABNORMAL LOW (ref 39.0–52.0)
Hemoglobin: 12.7 g/dL — ABNORMAL LOW (ref 13.0–17.0)
MCH: 32.5 pg (ref 26.0–34.0)
MCHC: 35.6 g/dL (ref 30.0–36.0)
MCV: 91.3 fL (ref 80.0–100.0)
Platelets: 155 10*3/uL (ref 150–400)
RBC: 3.91 MIL/uL — ABNORMAL LOW (ref 4.22–5.81)
RDW: 13.9 % (ref 11.5–15.5)
WBC: 3.8 10*3/uL — ABNORMAL LOW (ref 4.0–10.5)
nRBC: 0 % (ref 0.0–0.2)

## 2022-11-05 LAB — BASIC METABOLIC PANEL
Anion gap: 6 (ref 5–15)
BUN: 13 mg/dL (ref 6–20)
CO2: 26 mmol/L (ref 22–32)
Calcium: 8.7 mg/dL — ABNORMAL LOW (ref 8.9–10.3)
Chloride: 99 mmol/L (ref 98–111)
Creatinine, Ser: 0.96 mg/dL (ref 0.61–1.24)
GFR, Estimated: >60 mL/min (ref >60)
Glucose, Bld: 141 mg/dL — ABNORMAL HIGH (ref 70–99)
Potassium: 3.4 mmol/L — ABNORMAL LOW (ref 3.5–5.1)
Sodium: 131 mmol/L — ABNORMAL LOW (ref 135–145)

## 2022-11-05 LAB — MAGNESIUM: Magnesium: 2.2 mg/dL (ref 1.7–2.4)

## 2022-11-05 LAB — GLUCOSE, CAPILLARY: Glucose-Capillary: 105 mg/dL — ABNORMAL HIGH (ref 70–99)

## 2022-11-05 MED ORDER — PANTOPRAZOLE SODIUM 40 MG PO TBEC: 40.0000 mg | DELAYED_RELEASE_TABLET | Freq: Every day | ORAL | 1 refills | Status: DC

## 2022-11-05 MED ORDER — ONDANSETRON HCL 4 MG PO TABS: 4.0000 mg | ORAL_TABLET | Freq: Four times a day (QID) | ORAL | 0 refills | Status: DC | PRN

## 2022-11-05 MED ORDER — POTASSIUM CHLORIDE CRYS ER 20 MEQ PO TBCR
60.0000 meq | EXTENDED_RELEASE_TABLET | Freq: Once | ORAL | Status: AC
  Administered 2022-11-05: 60 meq via ORAL
  Filled 2022-11-05: qty 3

## 2022-11-05 MED ORDER — ADULT MULTIVITAMIN W/MINERALS CH: 1.0000 | ORAL_TABLET | Freq: Every day | ORAL | 3 refills | Status: AC

## 2022-11-05 MED ORDER — VITAMIN B-1 100 MG PO TABS: 100.0000 mg | ORAL_TABLET | Freq: Every day | ORAL | 0 refills | Status: DC

## 2022-11-05 MED ORDER — NICOTINE 7 MG/24HR TD PT24: 7.0000 mg | MEDICATED_PATCH | Freq: Every day | TRANSDERMAL | 0 refills | Status: DC

## 2022-11-05 MED ORDER — CHLORPROMAZINE HCL 10 MG PO TABS: 10.0000 mg | ORAL_TABLET | Freq: Three times a day (TID) | ORAL | 0 refills | Status: DC | PRN

## 2022-11-05 NOTE — Progress Notes (Signed)
Nurse reviewed discharge instructions with pt. Pt verbalized understanding of discharge instructions, follow up appointment and new medications.  No concerns at time of discharge.  Pt is driving himself home.

## 2022-11-05 NOTE — Discharge Summary (Signed)
Physician Discharge Summary  [name] HWK:[id number] DOB: 1965/12/31 DOA: 11/02/2022  PCP: Delorise Shiner, MD  Admit date: 11/02/2022 Discharge date: 11/05/2022  Admitted From: Home  Discharge disposition: Home  Recommendations for Outpatient Follow-Up:   Follow up with your primary care provider in one week at the [place].Marland Kitchen  Check CBC, BMP, magnesium in the next visit Patient should be discouraged to use alcohol.  Discharge Diagnosis:   Principal Problem:   AKI (acute kidney injury) ([place]) Active Problems:   CAD (coronary artery disease)   Hypertension   Type 2 diabetes mellitus with hyperlipidemia (HCC)   GERD (gastroesophageal reflux disease)   Hyponatremia   Acute pancreatitis   S/P CABG x 4   Chronic combined systolic and diastolic congestive heart failure ([place])  Discharge Condition: Improved.  Diet recommendation: Low sodium, heart healthy.  Carbohydrate-modified.    Wound care: None.  Code status: Full.   History of Present Illness:   [name] is a 57 y.o. male with medical history significant of GERD, concussion, CAD, NSTEMI in 2012, chronic combined systolic and diastolic heart failure with an EF of 40 to 45%, type 2 diabetes mellitus, history of EtOH abuse, hyperlipidemia, hypertension, sleep apnea, tobacco abuse presented to the emergency department complaints of back pain and emesis a week.  He was not able to hold anything down but was still using his insulin.  In the ED, patient was tachycardic and slightly hypertensive.  Labs showed glycosuria in the urine with ketonuria.  CBC was unremarkable.  Chemistry showed hyponatremia with sodium of 125 creatinine elevated at 2.4.  CT scan of the abdomen pelvis without any acute findings or hydronephrosis.  Coarse calcification in the pancreas were noted.  Patient received normal saline bolus and was admitted hospital for acute kidney injury, hyponatremia.      Hospital Course:   Following  conditions were addressed during hospitalization as listed below,  AKI (acute kidney injury) ([place]) Improved after aggressive hydration.  Patient was on hold lisinopril, Entresto, spironolactone, metformin and Jardiance at home which will be resumed on discharge.   Acute on likely chronic alcoholic pancreatitis Patient had emesis, abdominal pain and lipase was elevated at 200 on presentation with CT scan showing course of pancreatic head calcification.Marland Kitchen  He was treated conservatively with IV fluids antiemetics and gradually advanced on diet which he has tolerated.  He was also put on CIWA protocol but did not have overt withdrawal symptoms.   History of alcohol  abuse with possible mild withdrawal.  Has remained stable.  Will continue multivitamin thiamine folic acid on discharge.    Hyponatremia Likely secondary to diuretic and volume loss.  Has improved to 131.   Hypomagnesemia.  Has improved after replacement.  Latest magnesium of 2.2.   Hypokalemia.  Potassium has improved after aggressive replacement.  Will continue oral potassium on discharge.  Potassium prior to discharge was 3.4.   History of chronic combined systolic and  diastolic congestive heart failure  Patient was volume depleted on presentation.  Received IV fluids with improved hydration.    CAD (coronary artery disease)   S/P CABG x 4 Aspirin beta-blocker and statins will be continued.    Hypertension Continue Coreg     Type 2 diabetes mellitus with hyperglycemia. Continue Lantus, sliding scale insulin.  Hemoglobin A1c of 7.9, 4 months back.    GERD (gastroesophageal reflux disease) Will change omeprazole to pantoprazole on discharge.  Intractable hiccups.  Improved with PPI and Thorazine.  Prescribed for  few more days.  Advised against alcohol and recommended being on soft diet.  Disposition.  At this time, patient is stable for disposition home with outpatient PCP follow-up at the [place].  Medical Consultants:    None.  Procedures:    None Subjective:   Today, patient seen and examined at bedside.  Denies any nausea vomiting fever chills or rigor.  Has tolerated full liquids.  Was able to eat soft diet as well.  Hiccups have improved.  Wishes to go home.  Denies any hallucinations tremors sweating.  Discharge Exam:   Vitals:   11/04/22 1954 11/05/22 0550  BP: 120/85 (!) 137/94  Pulse: 78 72  Resp: 18 18  Temp: 98 F (36.7 C) 98.2 F (36.8 C)  SpO2: 100% 100%   Vitals:   11/04/22 0802 11/04/22 1139 11/04/22 1954 11/05/22 0550  BP: (!) 178/108 (!) 165/115 120/85 (!) 137/94  Pulse: 85 84 78 72  Resp:  '18 18 18  '$ Temp: 98.5 F (36.9 C) 98.1 F (36.7 C) 98 F (36.7 C) 98.2 F (36.8 C)  TempSrc: Oral Oral Oral Oral  SpO2: 100% 100% 100% 100%  Weight:      Height:        General: Alert awake, not in obvious distress HENT: pupils equally reacting to light,  No scleral pallor or icterus noted. Oral mucosa is moist.  Chest:  Clear breath sounds.   No crackles or wheezes.  CVS: S1 &S2 heard. No murmur.  Regular rate and rhythm. Abdomen: Soft, nontender, nondistended.  Bowel sounds are heard.   Extremities: No cyanosis, clubbing or edema.  Peripheral pulses are palpable. Psych: Alert, awake and oriented, normal mood CNS:  No cranial nerve deficits.  Power equal in all extremities.   Skin: Warm and dry.  No rashes noted.  The results of significant diagnostics from this hospitalization (including imaging, microbiology, ancillary and laboratory) are listed below for reference.     Diagnostic Studies:   CT Renal Stone Study  Result Date: 11/02/2022 CLINICAL DATA:  Abdominal pain, flank stone suspected EXAM: CT ABDOMEN AND PELVIS WITHOUT CONTRAST TECHNIQUE: Multidetector CT imaging of the abdomen and pelvis was performed following the standard protocol without IV contrast. RADIATION DOSE REDUCTION: This exam was performed according to the departmental dose-optimization program which  includes automated exposure control, adjustment of the mA and/or kV according to patient size and/or use of iterative reconstruction technique. COMPARISON:  CT abdomen pelvis 01/04/2021 FINDINGS: Lower chest: No acute abnormality. Evaluation abdominal viscera is limited by the lack of IV contrast. Hepatobiliary: No focal liver abnormality is seen. Normal gallbladder. Pancreas: Multiple coarse calcifications noted in the pancreatic head which may be related to recurrent/chronic pancreatitis. No acute inflammatory changes. Spleen: Normal in size without focal abnormality. Adrenals/Urinary Tract: Normal appearance of the right adrenal gland. Stable mild thickening of the left adrenal gland. No hydronephrosis or renal calculi identified. Stomach/Bowel: Scattered colonic stomach is within normal limits. Appendix appears normal. No evidence of bowel wall thickening, distention, or inflammatory changes. Diverticula without evidence of diverticulitis. Vascular/Lymphatic: Aortic atherosclerosis. No enlarged abdominal or pelvic lymph nodes. Reproductive: Prostate is unremarkable. Other: No abdominal wall hernia or abnormality. No abdominopelvic ascites. Musculoskeletal: No acute or significant osseous findings. IMPRESSION: 1. No acute intra-abdominal or pelvic pathology on a noncontrast exam. No renal calculi or hydronephrosis. 2. Multiple coarse calcifications in the pancreatic head which may be related to recurrent/chronic pancreatitis. No acute inflammatory changes. 3. Diverticula without evidence of diverticulitis. 4. Aortic atherosclerosis. Aortic Atherosclerosis (  ICD10-I70.0). Electronically Signed   By: Audie Pinto M.D.   On: 11/02/2022 13:44     Labs:   Basic Metabolic Panel: Recent Labs  Lab 11/02/22 1326 11/03/22 0525 11/04/22 0433 11/05/22 0438  NA 125* 128* 132* 131*  K 3.7 3.2* 2.4* 3.4*  CL 81* 92* 102 99  CO2 18* 19* 22 26  GLUCOSE 359* 268* 73 141*  BUN 50* 37* 16 13  CREATININE 2.46*  1.85* 0.95 0.96  CALCIUM 10.3 9.2 7.5* 8.7*  MG  --   --  1.3* 2.2   GFR Estimated Creatinine Clearance: 86.5 mL/min (by C-G formula based on SCr of 0.96 mg/dL). Liver Function Tests: Recent Labs  Lab 11/02/22 1326 11/03/22 0525  AST 22 17  ALT 24 18  ALKPHOS 64 50  BILITOT 1.4* 1.2  PROT 8.9* 7.3  ALBUMIN 4.6 3.6   Recent Labs  Lab 11/02/22 1326  LIPASE 200*   No results for input(s): "AMMONIA" in the last 168 hours. Coagulation profile No results for input(s): "INR", "PROTIME" in the last 168 hours.  CBC: Recent Labs  Lab 11/02/22 1326 11/03/22 0525 11/04/22 0433 11/05/22 0438  WBC 6.1 4.9 4.4 3.8*  HGB 16.1 14.0 12.3* 12.7*  HCT 45.8 39.3 34.4* 35.7*  MCV 91.6 91.2 91.7 91.3  PLT 153 138* 141* 155   Cardiac Enzymes: No results for input(s): "CKTOTAL", "CKMB", "CKMBINDEX", "TROPONINI" in the last 168 hours. BNP: Invalid input(s): "POCBNP" CBG: Recent Labs  Lab 11/04/22 0753 11/04/22 1140 11/04/22 1727 11/04/22 2100 11/05/22 0718  GLUCAP 134* 168* 312* 203* 105*   D-Dimer No results for input(s): "DDIMER" in the last 72 hours. Hgb A1c No results for input(s): "HGBA1C" in the last 72 hours. Lipid Profile No results for input(s): "CHOL", "HDL", "LDLCALC", "TRIG", "CHOLHDL", "LDLDIRECT" in the last 72 hours. Thyroid function studies No results for input(s): "TSH", "T4TOTAL", "T3FREE", "THYROIDAB" in the last 72 hours.  Invalid input(s): "FREET3" Anemia work up No results for input(s): "VITAMINB12", "FOLATE", "FERRITIN", "TIBC", "IRON", "RETICCTPCT" in the last 72 hours. Microbiology No results found for this or any previous visit (from the past 240 hour(s)).   Discharge Instructions:   Discharge Instructions     Call MD for:  persistant nausea and vomiting   Complete by: As directed    Call MD for:  severe uncontrolled pain   Complete by: As directed    Diet - low sodium heart healthy   Complete by: As directed    Low fat diet   Discharge  instructions   Complete by: As directed    Follow up with your primary care provider at the [facility] in one week. Seek medical attention for worsening symptoms.   Increase activity slowly   Complete by: As directed       Allergies as of 11/05/2022   No Known Allergies      Medication List     STOP taking these medications    clopidogrel 75 MG tablet Commonly known as: PLAVIX   insulin glargine-yfgn 100 UNIT/ML Pen Commonly known as: SEMGLEE   omeprazole 20 MG capsule Commonly known as: PRILOSEC   Varenicline Tartrate (Starter) 0.5 MG X 11 & 1 MG X 42 Tbpk Commonly known as: Chantix Starting Month Pak       TAKE these medications    aspirin 325 MG tablet Take 325 mg by mouth daily. What changed: Another medication with the same name was removed. Continue taking this medication, and follow the directions you see here.  carvedilol 25 MG tablet Commonly known as: COREG Take 25 mg by mouth 2 (two) times daily with a meal. What changed: Another medication with the same name was removed. Continue taking this medication, and follow the directions you see here.   chlorproMAZINE 10 MG tablet Commonly known as: THORAZINE Take 1 tablet (10 mg total) by mouth 3 (three) times daily as needed for hiccoughs.   Entresto 49-51 MG Generic drug: sacubitril-valsartan Take 1 tablet by mouth 2 (two) times daily.   ergocalciferol 1.25 MG (50000 UT) capsule Commonly known as: VITAMIN D2 Take 50,000 Units by mouth once a week. Sundays   furosemide 40 MG tablet Commonly known as: Lasix Take 1 tablet (40 mg total) by mouth daily.   glipiZIDE 5 MG tablet Commonly known as: GLUCOTROL Take 5 mg by mouth daily. 30 minutes prior to a meal   insulin glargine 100 UNIT/ML Solostar Pen Commonly known as: LANTUS Inject 45 Units into the skin at bedtime.   Jardiance 25 MG Tabs tablet Generic drug: empagliflozin Take 25 mg by mouth daily.   lisinopril 40 MG tablet Commonly known as:  ZESTRIL Take 40 mg by mouth daily.   metFORMIN 500 MG 24 hr tablet Commonly known as: GLUCOPHAGE-XR Take 500 mg by mouth 2 (two) times daily with a meal.   multivitamin with minerals Tabs tablet Take 1 tablet by mouth daily.   nicotine 7 mg/24hr patch Commonly known as: NICODERM CQ - dosed in mg/24 hr Place 1 patch (7 mg total) onto the skin daily.   ondansetron 4 MG tablet Commonly known as: ZOFRAN Take 1 tablet (4 mg total) by mouth every 6 (six) hours as needed for nausea.   pantoprazole 40 MG tablet Commonly known as: Protonix Take 1 tablet (40 mg total) by mouth daily.   potassium chloride SA 20 MEQ tablet Commonly known as: KLOR-CON M Take 1 tablet (20 mEq total) by mouth daily.   rosuvastatin 40 MG tablet Commonly known as: CRESTOR Take 1 tablet (40 mg total) by mouth daily.   sildenafil 100 MG tablet Commonly known as: VIAGRA Take 50 mg by mouth daily as needed for erectile dysfunction.   spironolactone 25 MG tablet Commonly known as: ALDACTONE Take 1 tablet (25 mg total) by mouth daily.   thiamine 100 MG tablet Commonly known as: Vitamin B-1 Take 1 tablet (100 mg total) by mouth daily.          Time coordinating discharge: 39 minutes  Signed:  Dhruti Ghuman  [facility] 11/05/2022, 10:57 AM

## 2022-11-06 ENCOUNTER — Encounter (HOSPITAL_COMMUNITY): Payer: Self-pay

## 2022-12-08 ENCOUNTER — Encounter (HOSPITAL_COMMUNITY): Payer: No Typology Code available for payment source

## 2022-12-08 NOTE — Progress Notes (Incomplete)
[facility]  HPI: [name] is a 57 y.o. AAM w/ h/o multivessel CAD s/p remote MI in 2012 and multiple PCIs to LAD, diagonal, ramus and OM vessels, HTN, HLD, Type 2 DM and tobacco/ETOH use.    Admitted 9/23 with NSTEMI. Echo showed EF 40-45%, severe mitral valve dysfunction w/ severe leaflet malcoaptation due to posterior leaflet tethering, severe MR and severely elevated RVSP 61 mmHg, RV normal. TEE confirmed mod-severe MR w/ tethered posterior leaflet, felt likely ischemic MR. TEE w/ + WMAs of mid to distal anterior and apical walls, anterolateral walls and basal to mid inferior and inferolateral walls, suggestive of multivessel territory ischemia/infarct. Underwent R/LHC showing 75% stenosis of m-dLM, 90% Ost- Prox Cx, 50% pRCA and 80% mRCA; elevated wedge (26), preserved CO (Fick) 5.4. Milrinone started to lower PA pressures. GI consulted due to bleeding, and underwent EGD/Colonoscopy showing hiatial hernia and 2 polyps resected, diverticulosis; capsule endoscopy showed no active bleeding/AVMs. He had cMRI showing LVEF 29% with viable myocardium, and mild to moderate MR, RVEF 61%. Underwent CABG x 4 (LIMA-LAD, SVG-PDA, sequential SVG-ramus and OM). Perioperative TEE showed only mild-mod MR. No indication for MVR. Drips eventually weaned off and GDMT started. He was discharged home, weight 160 lbs.  Echo was done today and reviewed, EF 40-45%, severe apical hypokinesis, moderate RV dysfunction, no MR.    He returns for followup of CHF. No chest pain.  He is short of breath walking up stairs. No dyspnea walking on flat ground.  No lightheadedness.  No orthopnea/PND.  No palpitations.  Weight is up but he states that he is eating better.  He is using CPAP.   ECG (personally reviewed): NSR, RBBB  Labs (9/23): K 4.3, creatinine 1.0  PMH: 1. Chronic systolic CHF: Ischemic cardiomyopathy.  - TEE (06/25/22): LVEF 40-45%, no PFO, no LAA thrombus, severe LAE, moderate to severe MR with a centro/posteriorly  directed jet, likely due to ischemic tethering of the posterior leaflet and dilated annulus in the setting of severe LAE - cMRI (9/23): LVEF 29% with viable myocardium, RVEF 61%, mild to moderate MR - Echo (9/23): EF 40-45%, grade III DD, RV OK, severe MR with leaflet malcoaptation due to posterior leaflet tethering. - Echo (12/23): EF 40-45%, severe apical hypokinesis, moderate RV dysfunction, no MR.  2. Mitral regurgitation: Possible ischemic MR.  Echo 12/23 showed no significant MR (after CABG).  3. Smoker 4. HTN 5. Hyperlipidemia 6. CAD: MI in 2012 and multiple PCIs to LAD, diagonal, ramus and OM vessels.  - NSTEMI 9/23, cath with 3 vessel disease. CABG (9/23) with LIMA-LAD, SVG-PDA, sequential SVG-ramus and OM.  7. Type 2 diabetes.  8. OSA: CPAP used.     Review of Systems: All systems reviewed and negative except as per HPI.    Current Outpatient Medications  Medication Sig Dispense Refill   aspirin 325 MG tablet Take 325 mg by mouth daily.     carvedilol (COREG) 25 MG tablet Take 25 mg by mouth 2 (two) times daily with a meal.     chlorproMAZINE (THORAZINE) 10 MG tablet Take 1 tablet (10 mg total) by mouth 3 (three) times daily as needed for hiccoughs. 15 tablet 0   empagliflozin (JARDIANCE) 25 MG TABS tablet Take 25 mg by mouth daily.     ergocalciferol (VITAMIN D2) 1.25 MG (50000 UT) capsule Take 50,000 Units by mouth once a week. Sundays     furosemide (LASIX) 40 MG tablet Take 1 tablet (40 mg total) by mouth daily. [place]  tablet 11   glipiZIDE (GLUCOTROL) 5 MG tablet Take 5 mg by mouth daily. 30 minutes prior to a meal     insulin glargine (LANTUS) 100 UNIT/ML Solostar Pen Inject 45 Units into the skin at bedtime.     lisinopril (ZESTRIL) 40 MG tablet Take 40 mg by mouth daily.     metFORMIN (GLUCOPHAGE-XR) 500 MG 24 hr tablet Take 500 mg by mouth 2 (two) times daily with a meal.     Multiple Vitamin (MULTIVITAMIN WITH MINERALS) TABS tablet Take 1 tablet by mouth daily. 30 tablet 3    nicotine (NICODERM CQ - DOSED IN MG/24 HR) 7 mg/24hr patch Place 1 patch (7 mg total) onto the skin daily. 28 patch 0   ondansetron (ZOFRAN) 4 MG tablet Take 1 tablet (4 mg total) by mouth every 6 (six) hours as needed for nausea. 20 tablet 0   pantoprazole (PROTONIX) 40 MG tablet Take 1 tablet (40 mg total) by mouth daily. 30 tablet 1   potassium chloride SA (KLOR-CON M) 20 MEQ tablet Take 1 tablet (20 mEq total) by mouth daily. 30 tablet 0   rosuvastatin (CRESTOR) 40 MG tablet Take 1 tablet (40 mg total) by mouth daily. 30 tablet 0   sacubitril-valsartan (ENTRESTO) 49-51 MG Take 1 tablet by mouth 2 (two) times daily. 60 tablet 11   sildenafil (VIAGRA) 100 MG tablet Take 50 mg by mouth daily as needed for erectile dysfunction.     spironolactone (ALDACTONE) 25 MG tablet Take 1 tablet (25 mg total) by mouth daily. 30 tablet 0   thiamine (VITAMIN B-1) 100 MG tablet Take 1 tablet (100 mg total) by mouth daily. 100 tablet 0   No current facility-administered medications for this visit.   No Known Allergies  Social History   Socioeconomic History   Marital status: Divorced    Spouse name: Not on file   Number of children: Not on file   Years of education: Not on file   Highest education level: Not on file  Occupational History   Not on file  Tobacco Use   Smoking status: Every Day    Packs/day: 0.25    Years: 30.00    Total pack years: 7.50    Types: Cigarettes   Smokeless tobacco: Never  Vaping Use   Vaping Use: Never used  Substance and Sexual Activity   Alcohol use: Yes    Alcohol/week: 6.0 standard drinks of alcohol    Types: 6 Cans of beer per week    Comment: occassionally on weekends   Drug use: No   Sexual activity: Not on file  Other Topics Concern   Not on file  Social History Narrative   Not on file   Social Determinants of Health   Financial Resource Strain: Not on file  Food Insecurity: No Food Insecurity (11/03/2022)   Hunger Vital Sign    Worried About  Running Out of Food in the Last Year: Never true    Ran Out of Food in the Last Year: Never true  Transportation Needs: No Transportation Needs (11/03/2022)   PRAPARE - Hydrologist (Medical): No    Lack of Transportation (Non-Medical): No  Physical Activity: Not on file  Stress: Not on file  Social Connections: Not on file  Intimate Partner Violence: Not At Risk (11/03/2022)   Humiliation, Afraid, Rape, and Kick questionnaire    Fear of Current or Ex-Partner: No    Emotionally Abused: No    Physically  Abused: No    Sexually Abused: No   Family History  Problem Relation Age of Onset   Diabetes Mother    There were no vitals taken for this visit.  Wt Readings from Last 3 Encounters:  11/03/22 70.3 kg (155 lb)  10/07/22 75.4 kg (166 lb 3.2 oz)  07/21/22 69.9 kg (154 lb 1.6 oz)   PHYSICAL EXAM: General: NAD Neck: JVP 8 cm, no thyromegaly or thyroid nodule.  Lungs: Clear to auscultation bilaterally with normal respiratory effort. CV: Nondisplaced PMI.  Heart regular S1/S2, no S3/S4, no murmur.  No peripheral edema.  No carotid bruit.  Normal pedal pulses.  Abdomen: Soft, nontender, no hepatosplenomegaly, no distention.  Skin: Intact without lesions or rashes.  Neurologic: Alert and oriented x 3.  Psych: Normal affect. Extremities: No clubbing or cyanosis.  HEENT: Normal.   ASSESSMENT & PLAN: 1.  Chronic systolic CHF/Ischemic cardiomyopathy: cMRI in 9/23 with EF as low as 29%.  Echo today (post-CABG) with EF 40-45%, severe apical hypokinesis, moderate RV dysfunction, no MR.  NYHA class II symptoms.  - Continue Lasix 40 mg daily, BMET today. - Continue spironolactone 25 mg daily.  - Continue carvedilol 6.25 mg bid.  - Continue Jardiance. - Increase Entresto to 49/51 bid.  BMET 10 days.  - Patient appears to be out of ICD range.  2. CAD: Known hx prior CAD with MI in 2012 and prior multivessel PCI. NSTEMI 9/23 with LHC showing severe multivessel CAD  including high grade LM disease. cMRI in 9/23 w/viable myocardium.  S/p CABG x 4 in 9/23. No chest pain.  - Continue ASA 81 daily.  - Continue Crestor 40 daily, check lipids today.  - Refer for cardiac rehab.  3. Mitral Regurgitation: Ischemic MR in setting of severe multivessel CAD.  This is significantly improved post-CABG, echo today with minimal MR.   4. Type 2DM - Continue Jardiance 10 mg daily.  5. Smoking: Wants to quit.  - Prescription for Chantix given.  6. OSA: Continue CPAP.    Followup in 2 months with [facility].   [place] 12/08/2022

## 2022-12-18 ENCOUNTER — Telehealth (HOSPITAL_COMMUNITY): Payer: Self-pay

## 2022-12-18 NOTE — Telephone Encounter (Signed)
No response from pt.  Closed referral  

## 2023-10-17 ENCOUNTER — Other Ambulatory Visit: Payer: Self-pay

## 2023-10-17 ENCOUNTER — Emergency Department (HOSPITAL_COMMUNITY)
Admission: EM | Admit: 2023-10-17 | Discharge: 2023-10-17 | Disposition: A | Discharge status: Home / Self Care | Payer: Non-veteran care | Attending: Emergency Medicine | Admitting: Emergency Medicine

## 2023-10-17 ENCOUNTER — Emergency Department (HOSPITAL_COMMUNITY): Payer: Non-veteran care

## 2023-10-17 ENCOUNTER — Encounter (HOSPITAL_COMMUNITY): Payer: Self-pay

## 2023-10-17 DIAGNOSIS — E119 Type 2 diabetes mellitus without complications: Secondary | ICD-10-CM | POA: Diagnosis not present

## 2023-10-17 DIAGNOSIS — R112 Nausea with vomiting, unspecified: Secondary | ICD-10-CM | POA: Diagnosis not present

## 2023-10-17 DIAGNOSIS — K5900 Constipation, unspecified: Secondary | ICD-10-CM | POA: Insufficient documentation

## 2023-10-17 DIAGNOSIS — I10 Essential (primary) hypertension: Secondary | ICD-10-CM | POA: Diagnosis not present

## 2023-10-17 DIAGNOSIS — N2889 Other specified disorders of kidney and ureter: Secondary | ICD-10-CM | POA: Diagnosis not present

## 2023-10-17 DIAGNOSIS — I2510 Atherosclerotic heart disease of native coronary artery without angina pectoris: Secondary | ICD-10-CM | POA: Insufficient documentation

## 2023-10-17 DIAGNOSIS — F17200 Nicotine dependence, unspecified, uncomplicated: Secondary | ICD-10-CM | POA: Diagnosis not present

## 2023-10-17 DIAGNOSIS — R1084 Generalized abdominal pain: Secondary | ICD-10-CM | POA: Insufficient documentation

## 2023-10-17 LAB — URINALYSIS, ROUTINE W REFLEX MICROSCOPIC
Bacteria, UA: NONE SEEN
Bilirubin Urine: NEGATIVE
Glucose, UA: >=500 mg/dL — AB
Ketones, ur: 80 mg/dL — AB
Leukocytes,Ua: NEGATIVE
Nitrite: NEGATIVE
Protein, ur: >=300 mg/dL — AB
Specific Gravity, Urine: 1.022 (ref 1.005–1.030)
pH: 5 (ref 5.0–8.0)

## 2023-10-17 LAB — COMPREHENSIVE METABOLIC PANEL
ALT: 35 U/L (ref 0–44)
AST: 51 U/L — ABNORMAL HIGH (ref 15–41)
Albumin: 4.7 g/dL (ref 3.5–5.0)
Alkaline Phosphatase: 75 U/L (ref 38–126)
Anion gap: 33 — ABNORMAL HIGH (ref 5–15)
BUN: 21 mg/dL — ABNORMAL HIGH (ref 6–20)
CO2: 10 mmol/L — ABNORMAL LOW (ref 22–32)
Calcium: 9.7 mg/dL (ref 8.9–10.3)
Chloride: 89 mmol/L — ABNORMAL LOW (ref 98–111)
Creatinine, Ser: 1.42 mg/dL — ABNORMAL HIGH (ref 0.61–1.24)
GFR, Estimated: 58 mL/min — ABNORMAL LOW (ref >60)
Glucose, Bld: 239 mg/dL — ABNORMAL HIGH (ref 70–99)
Potassium: 5.1 mmol/L (ref 3.5–5.1)
Sodium: 132 mmol/L — ABNORMAL LOW (ref 135–145)
Total Bilirubin: 1.7 mg/dL — ABNORMAL HIGH (ref <1.2)
Total Protein: 9.6 g/dL — ABNORMAL HIGH (ref 6.5–8.1)

## 2023-10-17 LAB — CBC
HCT: 46.9 % (ref 39.0–52.0)
Hemoglobin: 15.2 g/dL (ref 13.0–17.0)
MCH: 33.6 pg (ref 26.0–34.0)
MCHC: 32.4 g/dL (ref 30.0–36.0)
MCV: 103.8 fL — ABNORMAL HIGH (ref 80.0–100.0)
Platelets: 224 10*3/uL (ref 150–400)
RBC: 4.52 MIL/uL (ref 4.22–5.81)
RDW: 14.1 % (ref 11.5–15.5)
WBC: 11 10*3/uL — ABNORMAL HIGH (ref 4.0–10.5)
nRBC: 0 % (ref 0.0–0.2)

## 2023-10-17 LAB — LIPASE, BLOOD: Lipase: 85 U/L — ABNORMAL HIGH (ref 11–51)

## 2023-10-17 MED ORDER — SENNOSIDES-DOCUSATE SODIUM 8.6-50 MG PO TABS: 1.0000 | ORAL_TABLET | Freq: Every evening | ORAL | 0 refills | Status: DC | PRN

## 2023-10-17 MED ORDER — PROMETHAZINE HCL 25 MG PO TABS: 25.0000 mg | ORAL_TABLET | Freq: Four times a day (QID) | ORAL | 0 refills | Status: DC | PRN

## 2023-10-17 MED ORDER — PANTOPRAZOLE SODIUM 40 MG PO TBEC: 40.0000 mg | DELAYED_RELEASE_TABLET | Freq: Every day | ORAL | 0 refills | Status: DC

## 2023-10-17 MED ORDER — MORPHINE SULFATE (PF) 4 MG/ML IV SOLN
4.0000 mg | Freq: Once | INTRAVENOUS | Status: AC
  Administered 2023-10-17: 4 mg via INTRAVENOUS
  Filled 2023-10-17: qty 1

## 2023-10-17 MED ORDER — IOHEXOL 300 MG/ML  SOLN
100.0000 mL | Freq: Once | INTRAMUSCULAR | Status: AC | PRN
  Administered 2023-10-17: 100 mL via INTRAVENOUS

## 2023-10-17 MED ORDER — SODIUM CHLORIDE 0.9 % IV SOLN
1.0000 g | Freq: Once | INTRAVENOUS | Status: AC
  Administered 2023-10-17: 1 g via INTRAVENOUS
  Filled 2023-10-17: qty 10

## 2023-10-17 MED ORDER — CEFPODOXIME PROXETIL 200 MG PO TABS: 200.0000 mg | ORAL_TABLET | Freq: Two times a day (BID) | ORAL | 0 refills | Status: AC

## 2023-10-17 MED ORDER — SODIUM CHLORIDE 0.9 % IV BOLUS
500.0000 mL | Freq: Once | INTRAVENOUS | Status: AC
  Administered 2023-10-17: 500 mL via INTRAVENOUS

## 2023-10-17 NOTE — Discharge Instructions (Signed)
You were seen in the emerged department today with vomiting and constipation.  Your CT scan showed mild constipation but also showed concerning findings in both of your kidneys.  You have bilateral kidney masses which could be infection although you are not having symptoms of infection.  We are starting you on antibiotics as a precaution and will have you follow-up very closely with the urology team next week.  They should reach out to you to schedule a follow-up appointment but if you do not hear from them by Monday afternoon you should call the office.  If you develop worsening pain, fever, chills, other sudden/severe symptoms you should return to the emergency department for reevaluation.

## 2023-10-17 NOTE — ED Notes (Signed)
Called lab. Adding urine culture.

## 2023-10-17 NOTE — ED Provider Notes (Signed)
Emergency Department Provider Note   I have reviewed the triage vital signs and the nursing notes.   HISTORY  Chief Complaint Abdominal Pain   HPI [name] is a 57 y.o. male with past medical history reviewed below presents to the emergency department with constipation, nausea/vomiting, frequent belching.  He has some associated left flank/abdominal discomfort.  No back pain.  No fevers or chills.  No dysuria, hesitancy, urgency.  He is followed at the [facility] and has been taking Zofran with little improvement.  He states that he has not had a bowel movement for the past 2 weeks.  No blood in the emesis. Denies drugs. Does have an EtOH history.   Past Medical History:  Diagnosis Date   Acid reflux    Concussion    Coronary artery disease    Diabetes (HCC)    ETOH abuse    High cholesterol    Hypertension    MI (myocardial infarction) (HCC)    2012   Sleep apnea    USES CPAP AS NEEDED   Tobacco abuse     Review of Systems  Constitutional: No fever/chills Cardiovascular: Denies chest pain. Respiratory: Denies shortness of breath. Gastrointestinal: Positive left abdominal pain. Positive vomiting.  No diarrhea. Positive constipation. Genitourinary: Negative for dysuria. Musculoskeletal: Negative for back pain. Skin: Negative for rash. Neurological: Negative for headaches.   ____________________________________________   PHYSICAL EXAM:  VITAL SIGNS: ED Triage Vitals  Encounter Vitals Group     BP 10/17/23 0936 (!) 128/95     Pulse Rate 10/17/23 0936 (!) 105     Resp 10/17/23 0936 15     Temp 10/17/23 0936 97.6 F (36.4 C)     Temp Source 10/17/23 0936 Oral     SpO2 10/17/23 0936 100 %     Weight 10/17/23 0940 148 lb (67.1 kg)     Height 10/17/23 0940 5\' 10"  (1.778 m)   Constitutional: Alert and oriented. Well appearing and in no acute distress. Eyes: Conjunctivae are normal.  Head: Atraumatic. Nose: No congestion/rhinnorhea. Mouth/Throat: Mucous  membranes are moist.  Neck: No stridor.   Cardiovascular: Normal rate, regular rhythm. Good peripheral circulation. Grossly normal heart sounds.   Respiratory: Normal respiratory effort.  No retractions. Lungs CTAB. Gastrointestinal: Soft and nontender. No CVA tenderness. No distention.  Musculoskeletal: No gross deformities of extremities. Neurologic:  Normal speech and language.  Skin:  Skin is warm, dry and intact. No rash noted.   ____________________________________________   LABS (all labs ordered are listed, but only abnormal results are displayed)  Labs Reviewed  LIPASE, BLOOD - Abnormal; Notable for the following components:      Result Value   Lipase 85 (*)    All other components within normal limits  COMPREHENSIVE METABOLIC PANEL - Abnormal; Notable for the following components:   Sodium 132 (*)    Chloride 89 (*)    CO2 10 (*)    Glucose, Bld 239 (*)    BUN 21 (*)    Creatinine, Ser 1.42 (*)    Total Protein 9.6 (*)    AST 51 (*)    Total Bilirubin 1.7 (*)    GFR, Estimated 58 (*)    Anion gap 33 (*)    All other components within normal limits  CBC - Abnormal; Notable for the following components:   WBC 11.0 (*)    MCV 103.8 (*)    All other components within normal limits  URINALYSIS, ROUTINE W REFLEX MICROSCOPIC -  Abnormal; Notable for the following components:   Glucose, UA >=500 (*)    Hgb urine dipstick MODERATE (*)    Ketones, ur 80 (*)    Protein, ur >=300 (*)    All other components within normal limits  URINE CULTURE   ____________________________________________  RADIOLOGY  CT ABDOMEN PELVIS W CONTRAST Result Date: 10/17/2023 CLINICAL DATA:  57 year old male with history of acute non localized abdominal pain, and left-sided flank pain with nausea and emesis. EXAM: CT ABDOMEN AND PELVIS WITH CONTRAST TECHNIQUE: Multidetector CT imaging of the abdomen and pelvis was performed using the standard protocol following bolus administration of  intravenous contrast. RADIATION DOSE REDUCTION: This exam was performed according to the departmental dose-optimization program which includes automated exposure control, adjustment of the mA and/or kV according to patient size and/or use of iterative reconstruction technique. CONTRAST:  OMNIPAQUE IOHEXOL 300 MG/ML  SOLN COMPARISON:  CT of the abdomen and pelvis 11/02/2022. FINDINGS: Lower chest: Atherosclerotic calcifications in the left anterior descending, left circumflex and right coronary arteries. Median sternotomy wires. Hepatobiliary: Diffuse low attenuation throughout the hepatic parenchyma, indicative of hepatic steatosis. No suspicious cystic or solid hepatic lesions. No intra or extrahepatic biliary ductal dilatation. Status post cholecystectomy. Pancreas: Extensive coarse calcifications are noted throughout the pancreatic parenchyma, indicative of a background of chronic pancreatitis. No definite pancreatic mass. No pancreatic ductal dilatation. No pancreatic or peripancreatic fluid collections or inflammatory changes. Spleen: Unremarkable. Adrenals/Urinary Tract: Multiple areas of apparent hypoperfusion are noted in the kidneys bilaterally (left-greater-than-right), concerning for possible pyelonephritis. The largest of these regions in the interpolar region of the left kidney (axial image 29 of series 2 and coronal image 55 of series 4) is relatively well-defined, in somewhat mass-like in appearance (although this is not disrupt the normal renal contour), estimated to measure approximately 3.9 x 3.2 x 2.9 cm, with predominantly internal low-attenuation although there is some internal enhancement likely within septations. Overall, this is favored to represent a developing renal abscess. Multiple other more well-defined low-attenuation lesions are noted in both kidneys, some compatible with simple cysts others too small to characterize (but also likely small cysts). No hydroureteronephrosis.  Urinary bladder is unremarkable in appearance. Bilateral adrenal glands are normal in appearance. Stomach/Bowel: Linear metallic density in the midbody of the stomach, likely a clip from prior biopsy. Stomach is otherwise unremarkable in appearance. No suspicious cystic or solid hepatic lesions. No intra or extrahepatic biliary ductal dilatation. Normal appendix. Vascular/Lymphatic: Aortic atherosclerosis, without evidence of aneurysm or dissection in the abdominal or pelvic vasculature. No lymphadenopathy noted in the abdomen or pelvis. Reproductive: Prostate gland and seminal vesicles are unremarkable in appearance. Other: No significant volume of ascites.  No pneumoperitoneum. Musculoskeletal: There are no aggressive appearing lytic or blastic lesions noted in the visualized portions of the skeleton. IMPRESSION: 1. New lesions in both kidneys, as above, raising concerning for potential pyelonephritis and probable developing abscess in the interpolar region of the left kidney. Correlation with urinalysis is recommended. In the absence of signs and symptoms concerning for pyelonephritis and potential renal abscess, the possibility of infiltrative neoplasm such as renal lymphoma should be considered. Further clinical evaluation is recommended. 2. Imaging findings in the pancreas indicative of chronic pancreatitis, as above. 3. Hepatic steatosis. 4. Additional incidental findings, as above. Electronically Signed   By: Trudie Reed M.D.   On: 10/17/2023 13:31    ____________________________________________   PROCEDURES  Procedure(s) performed:   Procedures  None ____________________________________________   INITIAL IMPRESSION / ASSESSMENT AND  PLAN / ED COURSE  Pertinent labs & imaging results that were available during my care of the patient were reviewed by me and considered in my medical decision making (see chart for details).   This patient is Presenting for Evaluation of abdominal pain,  which does require a range of treatment options, and is a complaint that involves a high risk of morbidity and mortality.  The Differential Diagnoses includes but is not exclusive to acute appendicitis, renal colic, testicular torsion, urinary tract infection, prostatitis,  diverticulitis, small bowel obstruction, colitis, abdominal aortic aneurysm, gastroenteritis, constipation etc.   Critical Interventions-    Medications  cefTRIAXone (ROCEPHIN) 1 g in sodium chloride 0.9 % 100 mL IVPB (has no administration in time range)  sodium chloride 0.9 % bolus 500 mL (0 mLs Intravenous Stopped 10/17/23 1340)  iohexol (OMNIPAQUE) 300 MG/ML solution 100 mL (100 mLs Intravenous Contrast Given 10/17/23 1158)  morphine (PF) 4 MG/ML injection 4 mg (4 mg Intravenous Given 10/17/23 1348)    Reassessment after intervention: pain/nausea improved.    Clinical Laboratory Tests Ordered, included UA without evidence of infection.  CBC with mild leukocytosis to 11.  LFTs/bilirubin baseline.   Radiologic Tests Ordered, included CT abdomen/pelvis. I independently interpreted the images and agree with radiology interpretation.   Cardiac Monitor Tracing which shows NSR.    Social Determinants of Health Risk patient is a smoker.   Consult complete with Urology, Dr. Jennette Bill. Reviewed the case, UA, and CT. No sign/symptoms of infection. Will send urine culture and abx as a precaution but he will arrange very close follow up with Urology as an outpatient.   Medical Decision Making: Summary:  Presents to the emergency department with left side abdominal pain and constipation for the past 2 weeks.  Plan for CT abdomen pelvis to evaluate for significant stool burden and rule out small bowel obstruction.  Overall is well-appearing.  No fever or SIRS vitals.  Doubt sepsis.  Reevaluation with update and discussion with patient.  Discussed CT findings and specifically the kidney findings.  He has not had any UTI  symptoms, fever, other infectious etiology.  My suspicion is increased for possible malignancy.  We discussed close follow-up with urology and starting antibiotics as a precaution along with strict ED return precautions.  Considered admission but patient feeling improved after ED evaluation and stable for discharge with close Urology follow up.   Patient's presentation is most consistent with acute presentation with potential threat to life or bodily function.   Disposition: discharge  ____________________________________________  FINAL CLINICAL IMPRESSION(S) / ED DIAGNOSES  Final diagnoses:  Generalized abdominal pain  Nausea and vomiting, unspecified vomiting type  Constipation, unspecified constipation type  Bilateral kidney masses     NEW OUTPATIENT MEDICATIONS STARTED DURING THIS VISIT:  New Prescriptions   CEFPODOXIME (VANTIN) 200 MG TABLET    Take 1 tablet (200 mg total) by mouth 2 (two) times daily for 10 days.   PANTOPRAZOLE (PROTONIX) 40 MG TABLET    Take 1 tablet (40 mg total) by mouth daily.   PROMETHAZINE (PHENERGAN) 25 MG TABLET    Take 1 tablet (25 mg total) by mouth every 6 (six) hours as needed for nausea or vomiting.   SENNA-DOCUSATE (SENOKOT-S) 8.6-50 MG TABLET    Take 1 tablet by mouth at bedtime as needed for mild constipation or moderate constipation.    Note:  This document was prepared using Dragon voice recognition software and may include unintentional dictation errors.  Alona Bene, MD, [facility] Emergency  Medicine    Chiniqua Kilcrease, Arlyss Repress, MD 10/17/23 1450

## 2023-10-17 NOTE — ED Triage Notes (Signed)
[facility]. Pt reports having abd pain, left sided flank pain, nausea/emesis, and unable to have a BM for the last 2 wks. Pt has been taking zofran, but reports having emesis daily.

## 2023-10-19 LAB — URINE CULTURE: Culture: NO GROWTH

## 2024-06-14 ENCOUNTER — Emergency Department: Admission: EM | Admit: 2024-06-14 | Discharge: 2024-06-14 | Disposition: A | Discharge status: Home / Self Care

## 2024-06-14 ENCOUNTER — Emergency Department

## 2024-06-14 DIAGNOSIS — R55 Syncope and collapse: Secondary | ICD-10-CM | POA: Diagnosis present

## 2024-06-14 DIAGNOSIS — Z951 Presence of aortocoronary bypass graft: Secondary | ICD-10-CM | POA: Diagnosis not present

## 2024-06-14 DIAGNOSIS — I509 Heart failure, unspecified: Secondary | ICD-10-CM | POA: Insufficient documentation

## 2024-06-14 DIAGNOSIS — E119 Type 2 diabetes mellitus without complications: Secondary | ICD-10-CM | POA: Diagnosis not present

## 2024-06-14 DIAGNOSIS — I110 Hypertensive heart disease with heart failure: Secondary | ICD-10-CM | POA: Insufficient documentation

## 2024-06-14 DIAGNOSIS — E876 Hypokalemia: Secondary | ICD-10-CM | POA: Insufficient documentation

## 2024-06-14 DIAGNOSIS — R7989 Other specified abnormal findings of blood chemistry: Secondary | ICD-10-CM

## 2024-06-14 DIAGNOSIS — I2510 Atherosclerotic heart disease of native coronary artery without angina pectoris: Secondary | ICD-10-CM | POA: Insufficient documentation

## 2024-06-14 LAB — COMPREHENSIVE METABOLIC PANEL WITH GFR
ALT: 18 U/L (ref 0–44)
AST: 44 U/L — ABNORMAL HIGH (ref 15–41)
Albumin: 3.4 g/dL — ABNORMAL LOW (ref 3.5–5.0)
Alkaline Phosphatase: 53 U/L (ref 38–126)
Anion gap: 15 (ref 5–15)
BUN: 12 mg/dL (ref 6–20)
CO2: 20 mmol/L — ABNORMAL LOW (ref 22–32)
Calcium: 8.3 mg/dL — ABNORMAL LOW (ref 8.9–10.3)
Chloride: 99 mmol/L (ref 98–111)
Creatinine, Ser: 1.42 mg/dL — ABNORMAL HIGH (ref 0.61–1.24)
GFR, Estimated: 58 mL/min — ABNORMAL LOW (ref >60)
Glucose, Bld: 198 mg/dL — ABNORMAL HIGH (ref 70–99)
Potassium: 2.9 mmol/L — ABNORMAL LOW (ref 3.5–5.1)
Sodium: 134 mmol/L — ABNORMAL LOW (ref 135–145)
Total Bilirubin: 1.1 mg/dL (ref 0.0–1.2)
Total Protein: 7 g/dL (ref 6.5–8.1)

## 2024-06-14 LAB — CBC WITH DIFFERENTIAL/PLATELET
Abs Immature Granulocytes: 0.02 K/uL (ref 0.00–0.07)
Basophils Absolute: 0 K/uL (ref 0.0–0.1)
Basophils Relative: 0 %
Eosinophils Absolute: 0 K/uL (ref 0.0–0.5)
Eosinophils Relative: 0 %
HCT: 39.5 % (ref 39.0–52.0)
Hemoglobin: 13.4 g/dL (ref 13.0–17.0)
Immature Granulocytes: 0 %
Lymphocytes Relative: 22 %
Lymphs Abs: 1.1 K/uL (ref 0.7–4.0)
MCH: 34.2 pg — ABNORMAL HIGH (ref 26.0–34.0)
MCHC: 33.9 g/dL (ref 30.0–36.0)
MCV: 100.8 fL — ABNORMAL HIGH (ref 80.0–100.0)
Monocytes Absolute: 0.5 K/uL (ref 0.1–1.0)
Monocytes Relative: 10 %
Neutro Abs: 3.2 K/uL (ref 1.7–7.7)
Neutrophils Relative %: 68 %
Platelets: 196 K/uL (ref 150–400)
RBC: 3.92 MIL/uL — ABNORMAL LOW (ref 4.22–5.81)
RDW: 13 % (ref 11.5–15.5)
WBC: 4.8 K/uL (ref 4.0–10.5)
nRBC: 0 % (ref 0.0–0.2)

## 2024-06-14 LAB — BRAIN NATRIURETIC PEPTIDE: B Natriuretic Peptide: 240.4 pg/mL — ABNORMAL HIGH (ref 0.0–100.0)

## 2024-06-14 LAB — TROPONIN I (HIGH SENSITIVITY)
Troponin I (High Sensitivity): 33 ng/L — ABNORMAL HIGH (ref <18)
Troponin I (High Sensitivity): 39 ng/L — ABNORMAL HIGH (ref <18)

## 2024-06-14 LAB — D-DIMER, QUANTITATIVE: D-Dimer, Quant: 0.61 ug{FEU}/mL — ABNORMAL HIGH (ref 0.00–0.50)

## 2024-06-14 LAB — LIPASE, BLOOD: Lipase: 34 U/L (ref 11–51)

## 2024-06-14 LAB — ETHANOL: Alcohol, Ethyl (B): 114 mg/dL — ABNORMAL HIGH (ref <15)

## 2024-06-14 LAB — CBG MONITORING, ED: Glucose-Capillary: 217 mg/dL — ABNORMAL HIGH (ref 70–99)

## 2024-06-14 MED ORDER — IOHEXOL 350 MG/ML SOLN: 75.0000 mL | Freq: Once | INTRAVENOUS | Status: DC | PRN

## 2024-06-14 MED ORDER — ASPIRIN 81 MG PO CHEW
324.0000 mg | CHEWABLE_TABLET | Freq: Once | ORAL | Status: DC
  Filled 2024-06-14: qty 4

## 2024-06-14 MED ORDER — POTASSIUM CHLORIDE 20 MEQ PO PACK
40.0000 meq | PACK | Freq: Two times a day (BID) | ORAL | Status: DC
  Filled 2024-06-14: qty 2

## 2024-06-14 MED ORDER — MAGNESIUM SULFATE 2 GM/50ML IV SOLN
2.0000 g | Freq: Once | INTRAVENOUS | Status: DC
  Filled 2024-06-14: qty 50

## 2024-06-14 MED ORDER — POTASSIUM CHLORIDE 10 MEQ/100ML IV SOLN
10.0000 meq | Freq: Once | INTRAVENOUS | Status: DC
  Filled 2024-06-14: qty 100

## 2024-06-14 NOTE — ED Notes (Signed)
 Pt

## 2024-06-14 NOTE — ED Notes (Incomplete)
 Went to go in patient's room to give IV medications. PT's wife was at bedside and pt was in bed. PT stated he was ready to go home. I replied: [name] no problem that is completely up to you, but you are aware of several of your tests results being elevated right? I think the plan was for you to get a CT Scan to rule out some things.

## 2024-06-14 NOTE — ED Notes (Signed)
 Went to go in patient's room to give IV medications. PT's wife was at bedside and pt was in bed. PT stated he was ready to go home. I replied: Genna no problem that is completely up to you, but you are aware of several of your tests results being elevated right? I think the plan was for you to get a CT Scan to rule out some scary medical things. (Interactions between this RN and the patient previously in the shift were very therapeutic, the patient was nice, cooperative, and agreeable to get care he needed). Pt all of a sudden started yelling and saying I'M READY TO GO SO COME ON AND TAKE THIS STUFF OFF OF ME. The wife at the bedside was also saying very rude comments to me as the patient was yelling at me. I replied: Why are you acting so mean right now? You were so nice earlier when we met? I just care about you and wanted you to be aware of these things before I took all of the monitoring off and took the IV out of your arm, that's it. The patient and wife continued to say rude comments and getting irate/ aggressive towards this RN. I stated You both are acting insane right now. Pt's wife continued to yell at me and tell me that she was a mental health tech and calling her insane was so disrespectful. The wife then said out loud that she thinks this is a racial discrimination thing going on. I informed her that if it was a racial issue, then I would not be trying to inform the patient about these risks of leaving and would not care about the patient. No racial slur, comment, thought, or opinion was ever involved in the involvement of this patient's care. Staff heard yelling from the room, called security. Charge, RN, Shanda in room to assist with deescalating. Pt signed AMA form. IV removed. Pt had steady gait leaving. A&Ox4. The pt and wife were both yelling while walking down the hallway and state they will definitely be writing a bad review for this RN. I informed the pt and the wife that I hope the  patient get's to feeling better and God Bless them.

## 2024-06-14 NOTE — ED Provider Notes (Signed)
 [facility] Provider Note    Event Date/Time   First MD Initiated Contact with Patient 06/14/24 1930     (approximate)   History   Hypoglycemia and Near Syncope  Pt arrived to ED for syncopal episode at [employer]. Pt is a type II diabetic and was about to eat, when his family said he slumped over into the chair. Never fell to the floor, denies hitting head. Pt states he didn't lose consciousness. Pt currently A&Ox4. Pt's BGL was 60 when EMS arrived. Pt does smell of ETOH. Current blood glucose in ED is 217.    HPI [name] is a 58 y.o. male PMH diabetes, alcohol abuse, hypertension, hyperlipidemia, CAD with prior CABG, CHF presents for evaluation after loss of consciousness episode -Per EMS, patient slumped forward at the dinner table and he was about to eat.  Reportedly smells of alcohol.  Family felt he lost consciousness though patient denies this.  Glucose 60 by EMS. - Patient is not able to tell me what occurred.  Tells me he was eating dinner and then woke up on the ground.  Has no complaints.  Has been in his usual state of health.  Says he had 1 beer before dinner, denies any other substance use.  Takes Lantus , took his usual dose last night, did not eat much throughout the day but this is not abnormal for him. - Girlfriend provides collateral.  They were celebrating her birthday today with her family.  Notes he was at dinner, acting normally, and all of a sudden his head slumped over and he became unconscious.  Notes he was very diaphoretic.  Unconscious for 2-3 minutes, vomited.  They laid him onto the ground on his side and called 911.  Ambulance arrived 5 minutes later, and right as they were arriving he started arousing.  No convulsions.  No head strike or other trauma. - She checked his blood sugar monitor and noted that his blood sugar had been in the mid 50s since about noon.  Patient states he is regularly in the mid 74s when he wakes up  in the morning as well.  Patient had eaten salad and pizza at dinner tonight prior to this episode occurring. - When EMS checked his blood sugar it was 106.  Girlfriend notes that she did put some sugar in his mouth when he was unconscious that she wondered if his blood sugar may be low. - Patient notes he syncopized once in his life when he was a teenager, no other history of syncope - Denies any recent chest pain, shortness of breath, leg swelling, history of DVT/PE, hormone use.  No recent infectious symptoms.       Physical Exam   Triage Vital Signs: BP 96/75 (BP Location: Left Arm)   Pulse 86   Temp 98.9 F (37.2 C) (Oral)   Resp 18   Ht 6' (1.829 m)   Wt 67 kg   SpO2 98%   BMI 20.05 kg/m     Most recent vital signs: Vitals:   06/14/24 2009  BP: 96/75  Pulse: 86  Resp: 18  Temp: 98.9 F (37.2 C)  SpO2: 98%     General: Awake, no distress.  HEENT: Normocephalic, atraumatic, no midline neck pain, no meningismus CV:  Good peripheral perfusion. RRR, RP 2+ Resp:  Normal effort. CTAB Abd:  No distention. Nontender to deep palpation throughout Neuro:  AO x 4, face symmetric, moving all extremities spontaneously, no focal  motor deficit appreciated   ED Results / Procedures / Treatments   Labs (all labs ordered are listed, but only abnormal results are displayed) Labs Reviewed  CBG MONITORING, ED - Abnormal; Notable for the following components:      Result Value   Glucose-Capillary 217 (*)    All other components within normal limits  CBC WITH DIFFERENTIAL/PLATELET  COMPREHENSIVE METABOLIC PANEL WITH GFR  LIPASE, BLOOD  BRAIN NATRIURETIC PEPTIDE  ETHANOL  TROPONIN I (HIGH SENSITIVITY)     EKG  Ecg = sinus rhythm, rate 95, no gross ST elevation no possible trace depressions noted in V2-V3.  Right bundle branch block present.  QTc somewhat prolonged at 495.  EKG appears very similar to that from January  2024.   RADIOLOGY N/a    PROCEDURES:  Critical Care performed: {CriticalCareYesNo:19197::Yes, see critical care procedure note(s),No}  Procedures   MEDICATIONS ORDERED IN ED: Medications - No data to display   IMPRESSION / MDM / ASSESSMENT AND PLAN / ED COURSE  I reviewed the triage vital signs and the nursing notes.                              DDX/MDM/AP: Differential diagnosis includes, but is not limited to, syncope, consider possible arrhythmia especially given history of heart failure.  Consider possible transient hypoglycemia though this is not the clear etiology on my history.  Consider vasovagal episode.  Abdominal exam with no tenderness to deep palpation throughout, do not suspect acute underlying intra-abdominal pathology at this time.  Plan: - Labs - EKG -Cardiac monitor - Will continue to monitor closely and recheck glucose to ensure not downtrending  Patient's presentation is most consistent with acute presentation with potential threat to life or bodily function.  The patient is on the cardiac monitor to evaluate for evidence of arrhythmia and/or significant heart rate changes.  ED course below. ***  Clinical Course as of 06/14/24 2327  Tue Jun 14, 2024  2102 CBC reviewed, unremarkable [MM]  2208 CMP with hypokalemia to 2.9  Troponin mildly elevated  BNP mildly elevated  +etoh though not markedly so [MM]  2246 Patient reevaluated, discussed findings and my recommendation for admission given he had a prolonged syncopal episode with known history of heart failure no preceding prodrome.  Patient is very upset about this and is not amenable to admission, stating he would like to be discharged home immediately.  Is not amenable to a CTA.  Has clinically sobered on my eval.  Does respond to all questions appropriately.  Understands that his workup is abnormal and his clinical history is concerning and that I am recommending admission, also understands  that there may be underlying pathology that could result in poor outcomes including death.  Has decision-making capacity on my eval.  Discussed findings extensively with patient and his girlfriend bedside though he is adamant he would like to leave AGAINST MEDICAL ADVICE.  Will proceed with AMA discharge.  Encouraged him to follow-up closely with his primary care doctor and that he can return to our emergency department at any time if he decides he would like treatment. [MM]    Clinical Course User Index [MM] Clarine Ozell LABOR, MD     FINAL CLINICAL IMPRESSION(S) / ED DIAGNOSES   Final diagnoses:  None     Rx / DC Orders   ED Discharge Orders     None        Note:  This document was prepared using Dragon voice recognition software and may include unintentional dictation errors.

## 2024-06-14 NOTE — ED Notes (Signed)
 Writer called to pts room by EDT for concern of boisterous voices coming from patient and patients family member. Writer in room to assess situation and provided feedback from all parties involved with report made to follow up by management.

## 2024-06-14 NOTE — Discharge Instructions (Signed)
 You are leaving the hospital AGAINST MEDICAL ADVICE.  Return to the hospital if you decide you would like medical treatment.  Otherwise please follow-up closely with your primary care provider or seek care at another facility.

## 2024-06-14 NOTE — ED Triage Notes (Signed)
 Pt arrived to ED for syncopal episode at [employer]. Pt is a type II diabetic and was about to eat, when his family said he slumped over into the chair. Never fell to the floor, denies hitting head. Pt states he didn't lose consciousness. Pt currently A&Ox4. Pt's BGL was 60 when EMS arrived. Pt does smell of ETOH. Current blood glucose in ED is 217.

## 2024-09-08 ENCOUNTER — Inpatient Hospital Stay (HOSPITAL_COMMUNITY)
Admission: EM | Admit: 2024-09-08 | Discharge: 2024-09-19 | DRG: 302 | Disposition: E | Attending: Internal Medicine | Admitting: Internal Medicine

## 2024-09-08 ENCOUNTER — Emergency Department (HOSPITAL_COMMUNITY)

## 2024-09-08 ENCOUNTER — Encounter (HOSPITAL_COMMUNITY): Payer: Self-pay

## 2024-09-08 ENCOUNTER — Inpatient Hospital Stay (HOSPITAL_COMMUNITY)

## 2024-09-08 ENCOUNTER — Other Ambulatory Visit: Payer: Self-pay

## 2024-09-08 ENCOUNTER — Telehealth: Payer: Self-pay

## 2024-09-08 DIAGNOSIS — E871 Hypo-osmolality and hyponatremia: Secondary | ICD-10-CM | POA: Diagnosis present

## 2024-09-08 DIAGNOSIS — I110 Hypertensive heart disease with heart failure: Secondary | ICD-10-CM | POA: Diagnosis present

## 2024-09-08 DIAGNOSIS — E874 Mixed disorder of acid-base balance: Secondary | ICD-10-CM | POA: Diagnosis present

## 2024-09-08 DIAGNOSIS — F1090 Alcohol use, unspecified, uncomplicated: Secondary | ICD-10-CM

## 2024-09-08 DIAGNOSIS — N179 Acute kidney failure, unspecified: Secondary | ICD-10-CM | POA: Diagnosis present

## 2024-09-08 DIAGNOSIS — I5023 Acute on chronic systolic (congestive) heart failure: Secondary | ICD-10-CM | POA: Diagnosis present

## 2024-09-08 DIAGNOSIS — I5030 Unspecified diastolic (congestive) heart failure: Secondary | ICD-10-CM

## 2024-09-08 DIAGNOSIS — I340 Nonrheumatic mitral (valve) insufficiency: Secondary | ICD-10-CM | POA: Diagnosis present

## 2024-09-08 DIAGNOSIS — E119 Type 2 diabetes mellitus without complications: Secondary | ICD-10-CM | POA: Diagnosis present

## 2024-09-08 DIAGNOSIS — I513 Intracardiac thrombosis, not elsewhere classified: Secondary | ICD-10-CM | POA: Diagnosis present

## 2024-09-08 DIAGNOSIS — F17210 Nicotine dependence, cigarettes, uncomplicated: Secondary | ICD-10-CM | POA: Diagnosis present

## 2024-09-08 DIAGNOSIS — I493 Ventricular premature depolarization: Secondary | ICD-10-CM | POA: Diagnosis present

## 2024-09-08 DIAGNOSIS — E785 Hyperlipidemia, unspecified: Secondary | ICD-10-CM | POA: Diagnosis not present

## 2024-09-08 DIAGNOSIS — Z833 Family history of diabetes mellitus: Secondary | ICD-10-CM

## 2024-09-08 DIAGNOSIS — G253 Myoclonus: Secondary | ICD-10-CM | POA: Diagnosis present

## 2024-09-08 DIAGNOSIS — G931 Anoxic brain damage, not elsewhere classified: Secondary | ICD-10-CM | POA: Diagnosis present

## 2024-09-08 DIAGNOSIS — R570 Cardiogenic shock: Secondary | ICD-10-CM | POA: Diagnosis present

## 2024-09-08 DIAGNOSIS — Z7984 Long term (current) use of oral hypoglycemic drugs: Secondary | ICD-10-CM

## 2024-09-08 DIAGNOSIS — Z794 Long term (current) use of insulin: Secondary | ICD-10-CM

## 2024-09-08 DIAGNOSIS — Z66 Do not resuscitate: Secondary | ICD-10-CM | POA: Diagnosis present

## 2024-09-08 DIAGNOSIS — G4733 Obstructive sleep apnea (adult) (pediatric): Secondary | ICD-10-CM | POA: Diagnosis present

## 2024-09-08 DIAGNOSIS — J690 Pneumonitis due to inhalation of food and vomit: Secondary | ICD-10-CM | POA: Diagnosis not present

## 2024-09-08 DIAGNOSIS — R000 Tachycardia, unspecified: Secondary | ICD-10-CM | POA: Diagnosis not present

## 2024-09-08 DIAGNOSIS — Z515 Encounter for palliative care: Secondary | ICD-10-CM

## 2024-09-08 DIAGNOSIS — Z7982 Long term (current) use of aspirin: Secondary | ICD-10-CM

## 2024-09-08 DIAGNOSIS — Z79899 Other long term (current) drug therapy: Secondary | ICD-10-CM

## 2024-09-08 DIAGNOSIS — I10 Essential (primary) hypertension: Secondary | ICD-10-CM | POA: Diagnosis not present

## 2024-09-08 DIAGNOSIS — I469 Cardiac arrest, cause unspecified: Principal | ICD-10-CM | POA: Diagnosis present

## 2024-09-08 DIAGNOSIS — Z1152 Encounter for screening for COVID-19: Secondary | ICD-10-CM

## 2024-09-08 DIAGNOSIS — J9601 Acute respiratory failure with hypoxia: Secondary | ICD-10-CM | POA: Diagnosis present

## 2024-09-08 DIAGNOSIS — K769 Liver disease, unspecified: Secondary | ICD-10-CM | POA: Diagnosis present

## 2024-09-08 DIAGNOSIS — Z951 Presence of aortocoronary bypass graft: Secondary | ICD-10-CM

## 2024-09-08 DIAGNOSIS — E7800 Pure hypercholesterolemia, unspecified: Secondary | ICD-10-CM | POA: Diagnosis present

## 2024-09-08 DIAGNOSIS — Z529 Donor of unspecified organ or tissue: Secondary | ICD-10-CM | POA: Diagnosis not present

## 2024-09-08 DIAGNOSIS — Z87820 Personal history of traumatic brain injury: Secondary | ICD-10-CM

## 2024-09-08 DIAGNOSIS — Y906 Blood alcohol level of 120-199 mg/100 ml: Secondary | ICD-10-CM | POA: Diagnosis present

## 2024-09-08 DIAGNOSIS — I252 Old myocardial infarction: Secondary | ICD-10-CM

## 2024-09-08 DIAGNOSIS — I5021 Acute systolic (congestive) heart failure: Secondary | ICD-10-CM | POA: Diagnosis not present

## 2024-09-08 DIAGNOSIS — Z9911 Dependence on respirator [ventilator] status: Secondary | ICD-10-CM

## 2024-09-08 DIAGNOSIS — I2510 Atherosclerotic heart disease of native coronary artery without angina pectoris: Principal | ICD-10-CM | POA: Diagnosis present

## 2024-09-08 DIAGNOSIS — R569 Unspecified convulsions: Secondary | ICD-10-CM | POA: Diagnosis present

## 2024-09-08 DIAGNOSIS — F10129 Alcohol abuse with intoxication, unspecified: Secondary | ICD-10-CM | POA: Diagnosis present

## 2024-09-08 DIAGNOSIS — Z7189 Other specified counseling: Secondary | ICD-10-CM

## 2024-09-08 DIAGNOSIS — E8721 Acute metabolic acidosis: Secondary | ICD-10-CM | POA: Diagnosis not present

## 2024-09-08 DIAGNOSIS — G9340 Encephalopathy, unspecified: Secondary | ICD-10-CM

## 2024-09-08 DIAGNOSIS — K219 Gastro-esophageal reflux disease without esophagitis: Secondary | ICD-10-CM | POA: Diagnosis present

## 2024-09-08 LAB — ECHOCARDIOGRAM COMPLETE
Area-P 1/2: 2.8 cm2
Calc EF: 28.8 %
Height: 71 in
S' Lateral: 4.4 cm
Single Plane A2C EF: 19.1 %
Single Plane A4C EF: 36.3 %
Weight: 2363.33 [oz_av]

## 2024-09-08 LAB — BASIC METABOLIC PANEL WITH GFR
Anion gap: 23 — ABNORMAL HIGH (ref 5–15)
BUN: 12 mg/dL (ref 6–20)
CO2: 15 mmol/L — ABNORMAL LOW (ref 22–32)
Calcium: 8.9 mg/dL (ref 8.9–10.3)
Chloride: 95 mmol/L — ABNORMAL LOW (ref 98–111)
Creatinine, Ser: 1.19 mg/dL (ref 0.61–1.24)
GFR, Estimated: >60 mL/min (ref >60)
Glucose, Bld: 134 mg/dL — ABNORMAL HIGH (ref 70–99)
Potassium: 2.7 mmol/L — CL (ref 3.5–5.1)
Sodium: 133 mmol/L — ABNORMAL LOW (ref 135–145)

## 2024-09-08 LAB — TROPONIN I (HIGH SENSITIVITY)
Troponin I (High Sensitivity): 49 ng/L — ABNORMAL HIGH (ref <18)
Troponin I (High Sensitivity): 125 ng/L (ref <18)
Troponin I (High Sensitivity): 174 ng/L (ref <18)

## 2024-09-08 LAB — I-STAT ARTERIAL BLOOD GAS, ED
Acid-base deficit: 10 mmol/L — ABNORMAL HIGH (ref 0.0–2.0)
Bicarbonate: 13.4 mmol/L — ABNORMAL LOW (ref 20.0–28.0)
Calcium, Ion: 1.06 mmol/L — ABNORMAL LOW (ref 1.15–1.40)
HCT: 32 % — ABNORMAL LOW (ref 39.0–52.0)
Hemoglobin: 10.9 g/dL — ABNORMAL LOW (ref 13.0–17.0)
O2 Saturation: 100 %
Potassium: 3.5 mmol/L (ref 3.5–5.1)
Sodium: 134 mmol/L — ABNORMAL LOW (ref 135–145)
TCO2: 14 mmol/L — ABNORMAL LOW (ref 22–32)
pCO2 arterial: 23.1 mmHg — ABNORMAL LOW (ref 32–48)
pH, Arterial: 7.371 (ref 7.35–7.45)
pO2, Arterial: 506 mmHg — ABNORMAL HIGH (ref 83–108)

## 2024-09-08 LAB — URINALYSIS, ROUTINE W REFLEX MICROSCOPIC
Bilirubin Urine: NEGATIVE
Glucose, UA: 50 mg/dL — AB
Ketones, ur: NEGATIVE mg/dL
Leukocytes,Ua: NEGATIVE
Nitrite: NEGATIVE
Protein, ur: >=300 mg/dL — AB
Specific Gravity, Urine: 1.016 (ref 1.005–1.030)
pH: 5 (ref 5.0–8.0)

## 2024-09-08 LAB — COMPREHENSIVE METABOLIC PANEL WITH GFR
ALT: 67 U/L — ABNORMAL HIGH (ref 0–44)
AST: 247 U/L — ABNORMAL HIGH (ref 15–41)
Albumin: 3.1 g/dL — ABNORMAL LOW (ref 3.5–5.0)
Alkaline Phosphatase: 61 U/L (ref 38–126)
Anion gap: 22 — ABNORMAL HIGH (ref 5–15)
BUN: 9 mg/dL (ref 6–20)
CO2: 15 mmol/L — ABNORMAL LOW (ref 22–32)
Calcium: 8.4 mg/dL — ABNORMAL LOW (ref 8.9–10.3)
Chloride: 96 mmol/L — ABNORMAL LOW (ref 98–111)
Creatinine, Ser: 1.14 mg/dL (ref 0.61–1.24)
GFR, Estimated: >60 mL/min (ref >60)
Glucose, Bld: 144 mg/dL — ABNORMAL HIGH (ref 70–99)
Potassium: 3.6 mmol/L (ref 3.5–5.1)
Sodium: 133 mmol/L — ABNORMAL LOW (ref 135–145)
Total Bilirubin: 0.8 mg/dL (ref 0.0–1.2)
Total Protein: 6.5 g/dL (ref 6.5–8.1)

## 2024-09-08 LAB — HEMOGLOBIN A1C
Hgb A1c MFr Bld: 7.8 % — ABNORMAL HIGH (ref 4.8–5.6)
Mean Plasma Glucose: 177.16 mg/dL

## 2024-09-08 LAB — GLUCOSE, CAPILLARY
Glucose-Capillary: 100 mg/dL — ABNORMAL HIGH (ref 70–99)
Glucose-Capillary: 116 mg/dL — ABNORMAL HIGH (ref 70–99)
Glucose-Capillary: 129 mg/dL — ABNORMAL HIGH (ref 70–99)
Glucose-Capillary: 141 mg/dL — ABNORMAL HIGH (ref 70–99)

## 2024-09-08 LAB — CG4 I-STAT (LACTIC ACID): Lactic Acid, Venous: 1.3 mmol/L (ref 0.5–1.9)

## 2024-09-08 LAB — CBC
HCT: 32.5 % — ABNORMAL LOW (ref 39.0–52.0)
Hemoglobin: 10.9 g/dL — ABNORMAL LOW (ref 13.0–17.0)
MCH: 33.9 pg (ref 26.0–34.0)
MCHC: 33.5 g/dL (ref 30.0–36.0)
MCV: 100.9 fL — ABNORMAL HIGH (ref 80.0–100.0)
Platelets: 147 K/uL — ABNORMAL LOW (ref 150–400)
RBC: 3.22 MIL/uL — ABNORMAL LOW (ref 4.22–5.81)
RDW: 12.4 % (ref 11.5–15.5)
WBC: 1.9 K/uL — ABNORMAL LOW (ref 4.0–10.5)
nRBC: 0 % (ref 0.0–0.2)

## 2024-09-08 LAB — ETHANOL: Alcohol, Ethyl (B): 158 mg/dL — ABNORMAL HIGH (ref <15)

## 2024-09-08 LAB — PROTIME-INR
INR: 1.1 (ref 0.8–1.2)
Prothrombin Time: 14.7 s (ref 11.4–15.2)

## 2024-09-08 LAB — TYPE AND SCREEN
ABO/RH(D): A POS
Antibody Screen: NEGATIVE

## 2024-09-08 LAB — RAPID URINE DRUG SCREEN, HOSP PERFORMED
Amphetamines: NOT DETECTED
Barbiturates: NOT DETECTED
Benzodiazepines: NOT DETECTED
Cocaine: NOT DETECTED
Opiates: NOT DETECTED
Tetrahydrocannabinol: NOT DETECTED

## 2024-09-08 LAB — CBG MONITORING, ED: Glucose-Capillary: 114 mg/dL — ABNORMAL HIGH (ref 70–99)

## 2024-09-08 LAB — TRIGLYCERIDES: Triglycerides: 67 mg/dL (ref <150)

## 2024-09-08 LAB — MAGNESIUM
Magnesium: 1.6 mg/dL — ABNORMAL LOW (ref 1.7–2.4)
Magnesium: 2.3 mg/dL (ref 1.7–2.4)

## 2024-09-08 LAB — PHOSPHORUS
Phosphorus: 6.8 mg/dL — ABNORMAL HIGH (ref 2.5–4.6)
Phosphorus: 3 mg/dL (ref 2.5–4.6)

## 2024-09-08 LAB — I-STAT CG4 LACTIC ACID, ED: Lactic Acid, Venous: 10.5 mmol/L (ref 0.5–1.9)

## 2024-09-08 LAB — APTT: aPTT: 27 s (ref 24–36)

## 2024-09-08 LAB — MRSA NEXT GEN BY PCR, NASAL: MRSA by PCR Next Gen: NOT DETECTED

## 2024-09-08 MED ORDER — CHLORHEXIDINE GLUCONATE CLOTH 2 % EX PADS
6.0000 | MEDICATED_PAD | Freq: Every day | CUTANEOUS | Status: DC
  Administered 2024-09-08 – 2024-09-15 (×9): 6 via TOPICAL

## 2024-09-08 MED ORDER — INSULIN ASPART 100 UNIT/ML IJ SOLN
0.0000 [IU] | INTRAMUSCULAR | Status: DC
  Administered 2024-09-08 – 2024-09-09 (×2): 1 [IU] via SUBCUTANEOUS
  Administered 2024-09-09: 2 [IU] via SUBCUTANEOUS
  Administered 2024-09-09 (×3): 1 [IU] via SUBCUTANEOUS
  Administered 2024-09-09: 2 [IU] via SUBCUTANEOUS
  Administered 2024-09-09: 1 [IU] via SUBCUTANEOUS
  Administered 2024-09-10: 2 [IU] via SUBCUTANEOUS
  Administered 2024-09-10: 1 [IU] via SUBCUTANEOUS
  Administered 2024-09-10: 3 [IU] via SUBCUTANEOUS
  Administered 2024-09-10 – 2024-09-11 (×2): 2 [IU] via SUBCUTANEOUS
  Administered 2024-09-11: 1 [IU] via SUBCUTANEOUS
  Administered 2024-09-11 (×2): 2 [IU] via SUBCUTANEOUS
  Administered 2024-09-12 (×3): 3 [IU] via SUBCUTANEOUS
  Administered 2024-09-12 (×4): 2 [IU] via SUBCUTANEOUS
  Administered 2024-09-13: 3 [IU] via SUBCUTANEOUS
  Administered 2024-09-13: 2 [IU] via SUBCUTANEOUS
  Administered 2024-09-13: 3 [IU] via SUBCUTANEOUS
  Administered 2024-09-13 – 2024-09-14 (×4): 2 [IU] via SUBCUTANEOUS
  Administered 2024-09-14: 3 [IU] via SUBCUTANEOUS
  Administered 2024-09-14: 5 [IU] via SUBCUTANEOUS
  Administered 2024-09-14 (×2): 3 [IU] via SUBCUTANEOUS
  Administered 2024-09-15: 2 [IU] via SUBCUTANEOUS
  Administered 2024-09-15: 1 [IU] via SUBCUTANEOUS
  Administered 2024-09-15 (×3): 2 [IU] via SUBCUTANEOUS
  Administered 2024-09-15 – 2024-09-16 (×4): 1 [IU] via SUBCUTANEOUS
  Filled 2024-09-08: qty 3
  Filled 2024-09-08 (×3): qty 2
  Filled 2024-09-08: qty 3
  Filled 2024-09-08: qty 2
  Filled 2024-09-08: qty 1
  Filled 2024-09-08: qty 3
  Filled 2024-09-08: qty 2
  Filled 2024-09-08: qty 3
  Filled 2024-09-08: qty 2
  Filled 2024-09-08: qty 1
  Filled 2024-09-08: qty 2
  Filled 2024-09-08: qty 3
  Filled 2024-09-08: qty 1
  Filled 2024-09-08 (×2): qty 2
  Filled 2024-09-08 (×2): qty 3
  Filled 2024-09-08 (×3): qty 2
  Filled 2024-09-08: qty 1
  Filled 2024-09-08: qty 2
  Filled 2024-09-08: qty 3
  Filled 2024-09-08: qty 2
  Filled 2024-09-08 (×2): qty 1
  Filled 2024-09-08: qty 3
  Filled 2024-09-08 (×2): qty 2
  Filled 2024-09-08 (×3): qty 1
  Filled 2024-09-08: qty 2
  Filled 2024-09-08: qty 1
  Filled 2024-09-08: qty 2
  Filled 2024-09-08 (×2): qty 3
  Filled 2024-09-08: qty 5
  Filled 2024-09-08: qty 2

## 2024-09-08 MED ORDER — DOCUSATE SODIUM 50 MG/5ML PO LIQD
100.0000 mg | Freq: Two times a day (BID) | ORAL | Status: DC
  Administered 2024-09-08 – 2024-09-16 (×10): 100 mg
  Filled 2024-09-08 (×12): qty 10

## 2024-09-08 MED ORDER — AMIODARONE LOAD VIA INFUSION: 150.0000 mg | Freq: Once | INTRAVENOUS | Status: DC

## 2024-09-08 MED ORDER — NOREPINEPHRINE 4 MG/250ML-% IV SOLN
0.0000 ug/min | INTRAVENOUS | Status: DC
  Administered 2024-09-08: 10 ug/min via INTRAVENOUS
  Administered 2024-09-09 – 2024-09-11 (×2): 2 ug/min via INTRAVENOUS
  Administered 2024-09-14: 3 ug/min via INTRAVENOUS
  Administered 2024-09-15: 2 ug/min via INTRAVENOUS
  Filled 2024-09-08 (×5): qty 250

## 2024-09-08 MED ORDER — CALCIUM GLUCONATE-NACL 2-0.675 GM/100ML-% IV SOLN
2.0000 g | Freq: Once | INTRAVENOUS | Status: AC
  Administered 2024-09-08: 2000 mg via INTRAVENOUS
  Filled 2024-09-08: qty 100

## 2024-09-08 MED ORDER — MAGNESIUM SULFATE 4 GM/100ML IV SOLN
4.0000 g | Freq: Once | INTRAVENOUS | Status: AC
  Administered 2024-09-08: 4 g via INTRAVENOUS
  Filled 2024-09-08: qty 100

## 2024-09-08 MED ORDER — POLYETHYLENE GLYCOL 3350 17 G PO PACK
17.0000 g | PACK | Freq: Every day | ORAL | Status: DC
  Administered 2024-09-11 – 2024-09-16 (×4): 17 g
  Filled 2024-09-08 (×6): qty 1

## 2024-09-08 MED ORDER — POTASSIUM CHLORIDE 20 MEQ PO PACK
40.0000 meq | PACK | ORAL | Status: AC
  Administered 2024-09-08 – 2024-09-09 (×3): 40 meq
  Filled 2024-09-08 (×3): qty 2

## 2024-09-08 MED ORDER — HEPARIN BOLUS VIA INFUSION
3000.0000 [IU] | Freq: Once | INTRAVENOUS | Status: AC
  Administered 2024-09-08: 3000 [IU] via INTRAVENOUS
  Filled 2024-09-08: qty 3000

## 2024-09-08 MED ORDER — FENTANYL CITRATE (PF) 50 MCG/ML IJ SOSY
PREFILLED_SYRINGE | INTRAMUSCULAR | Status: AC
  Filled 2024-09-08: qty 1

## 2024-09-08 MED ORDER — LEVETIRACETAM (KEPPRA) 500 MG/5 ML ADULT IV PUSH
60.0000 mg/kg | Freq: Once | INTRAVENOUS | Status: AC
  Administered 2024-09-08: 4000 mg via INTRAVENOUS
  Filled 2024-09-08: qty 40

## 2024-09-08 MED ORDER — MIDAZOLAM HCL 2 MG/2ML IJ SOLN
INTRAMUSCULAR | Status: AC
  Filled 2024-09-08: qty 2

## 2024-09-08 MED ORDER — ACETAMINOPHEN 325 MG PO TABS: 650.0000 mg | ORAL_TABLET | ORAL | Status: DC | PRN

## 2024-09-08 MED ORDER — AMIODARONE HCL IN DEXTROSE 360-4.14 MG/200ML-% IV SOLN: 30.0000 mg/h | INTRAVENOUS | Status: DC

## 2024-09-08 MED ORDER — FENTANYL BOLUS VIA INFUSION
25.0000 ug | INTRAVENOUS | Status: DC | PRN
  Administered 2024-09-08: 50 ug via INTRAVENOUS

## 2024-09-08 MED ORDER — SODIUM CHLORIDE 0.9 % IV SOLN
2.0000 g | INTRAVENOUS | Status: DC
  Administered 2024-09-08 – 2024-09-15 (×8): 2 g via INTRAVENOUS
  Filled 2024-09-08 (×8): qty 20

## 2024-09-08 MED ORDER — SODIUM CHLORIDE 0.9 % IV SOLN: 250.0000 mL | INTRAVENOUS | Status: AC

## 2024-09-08 MED ORDER — FOLIC ACID 1 MG PO TABS
1.0000 mg | ORAL_TABLET | Freq: Every day | ORAL | Status: DC
  Administered 2024-09-08 – 2024-09-16 (×9): 1 mg
  Filled 2024-09-08 (×9): qty 1

## 2024-09-08 MED ORDER — HEPARIN (PORCINE) 25000 UT/250ML-% IV SOLN
1200.0000 [IU]/h | INTRAVENOUS | Status: DC
  Administered 2024-09-08 – 2024-09-09 (×2): 1000 [IU]/h via INTRAVENOUS
  Administered 2024-09-10 – 2024-09-13 (×4): 1100 [IU]/h via INTRAVENOUS
  Administered 2024-09-14 – 2024-09-15 (×2): 1000 [IU]/h via INTRAVENOUS
  Filled 2024-09-08 (×8): qty 250

## 2024-09-08 MED ORDER — LEVETIRACETAM (KEPPRA) 500 MG/5 ML ADULT IV PUSH
1000.0000 mg | Freq: Two times a day (BID) | INTRAVENOUS | Status: DC
  Administered 2024-09-08 – 2024-09-14 (×13): 1000 mg via INTRAVENOUS
  Filled 2024-09-08 (×13): qty 10

## 2024-09-08 MED ORDER — POTASSIUM CHLORIDE 20 MEQ PO PACK
40.0000 meq | PACK | Freq: Once | ORAL | Status: AC
  Administered 2024-09-08: 40 meq
  Filled 2024-09-08: qty 2

## 2024-09-08 MED ORDER — SODIUM CHLORIDE 0.9 % IV SOLN: INTRAVENOUS | Status: AC | PRN

## 2024-09-08 MED ORDER — DOCUSATE SODIUM 100 MG PO CAPS
100.0000 mg | ORAL_CAPSULE | Freq: Two times a day (BID) | ORAL | Status: DC | PRN
Start: 2024-09-08 — End: 2024-09-08

## 2024-09-08 MED ORDER — FAMOTIDINE 20 MG PO TABS
20.0000 mg | ORAL_TABLET | Freq: Two times a day (BID) | ORAL | Status: DC
  Administered 2024-09-08 – 2024-09-16 (×17): 20 mg
  Filled 2024-09-08 (×17): qty 1

## 2024-09-08 MED ORDER — VALPROATE SODIUM 100 MG/ML IV SOLN
1500.0000 mg | Freq: Once | INTRAVENOUS | Status: AC
  Administered 2024-09-08: 1500 mg via INTRAVENOUS
  Filled 2024-09-08: qty 15

## 2024-09-08 MED ORDER — DOCUSATE SODIUM 50 MG/5ML PO LIQD: 100.0000 mg | Freq: Two times a day (BID) | ORAL | Status: DC | PRN

## 2024-09-08 MED ORDER — SODIUM CHLORIDE 0.9 % IV SOLN
250.0000 mL | INTRAVENOUS | Status: AC
  Administered 2024-09-08: 250 mL via INTRAVENOUS

## 2024-09-08 MED ORDER — LORAZEPAM 2 MG/ML IJ SOLN
4.0000 mg | Freq: Once | INTRAMUSCULAR | Status: AC
  Administered 2024-09-08: 4 mg via INTRAVENOUS
  Filled 2024-09-08: qty 2

## 2024-09-08 MED ORDER — AMIODARONE HCL IN DEXTROSE 360-4.14 MG/200ML-% IV SOLN: 60.0000 mg/h | INTRAVENOUS | Status: DC

## 2024-09-08 MED ORDER — FENTANYL CITRATE (PF) 50 MCG/ML IJ SOSY: 25.0000 ug | PREFILLED_SYRINGE | Freq: Once | INTRAMUSCULAR | Status: DC

## 2024-09-08 MED ORDER — POLYETHYLENE GLYCOL 3350 17 G PO PACK: 17.0000 g | PACK | Freq: Every day | ORAL | Status: DC | PRN

## 2024-09-08 MED ORDER — LACTATED RINGERS IV BOLUS
1000.0000 mL | Freq: Once | INTRAVENOUS | Status: AC
  Administered 2024-09-08: 1000 mL via INTRAVENOUS

## 2024-09-08 MED ORDER — NALOXONE HCL 0.4 MG/ML IJ SOLN
0.4000 mg | Freq: Once | INTRAMUSCULAR | Status: AC
  Administered 2024-09-08: 0.4 mg via INTRAVENOUS

## 2024-09-08 MED ORDER — THIAMINE HCL 100 MG/ML IJ SOLN
100.0000 mg | Freq: Every day | INTRAMUSCULAR | Status: DC
  Administered 2024-09-08 – 2024-09-14 (×7): 100 mg via INTRAVENOUS
  Filled 2024-09-08 (×7): qty 2

## 2024-09-08 MED ORDER — ADULT MULTIVITAMIN W/MINERALS CH
1.0000 | ORAL_TABLET | Freq: Every day | ORAL | Status: DC
  Administered 2024-09-08 – 2024-09-16 (×9): 1
  Filled 2024-09-08 (×9): qty 1

## 2024-09-08 MED ORDER — PERFLUTREN LIPID MICROSPHERE
1.0000 mL | INTRAVENOUS | Status: AC | PRN
  Administered 2024-09-08: 2 mL via INTRAVENOUS

## 2024-09-08 MED ORDER — NICOTINE 7 MG/24HR TD PT24
7.0000 mg | MEDICATED_PATCH | Freq: Every day | TRANSDERMAL | Status: DC
  Administered 2024-09-08 – 2024-09-13 (×6): 7 mg via TRANSDERMAL
  Filled 2024-09-08 (×9): qty 1

## 2024-09-08 MED ORDER — FENTANYL 2500MCG IN NS 250ML (10MCG/ML) PREMIX INFUSION
0.0000 ug/h | INTRAVENOUS | Status: DC
  Administered 2024-09-08: 50 ug/h via INTRAVENOUS
  Filled 2024-09-08: qty 250

## 2024-09-08 MED ORDER — PROPOFOL 1000 MG/100ML IV EMUL
0.0000 ug/kg/min | INTRAVENOUS | Status: DC
  Administered 2024-09-08: 10 ug/kg/min via INTRAVENOUS
  Administered 2024-09-08: 50 ug/kg/min via INTRAVENOUS
  Administered 2024-09-08 – 2024-09-09 (×6): 80 ug/kg/min via INTRAVENOUS
  Administered 2024-09-10: 60 ug/kg/min via INTRAVENOUS
  Administered 2024-09-10 (×3): 80 ug/kg/min via INTRAVENOUS
  Administered 2024-09-10: 70 ug/kg/min via INTRAVENOUS
  Administered 2024-09-10: 60 ug/kg/min via INTRAVENOUS
  Administered 2024-09-11 – 2024-09-12 (×16): 80 ug/kg/min via INTRAVENOUS
  Administered 2024-09-13: 75 ug/kg/min via INTRAVENOUS
  Administered 2024-09-13 – 2024-09-15 (×16): 80 ug/kg/min via INTRAVENOUS
  Administered 2024-09-15: 70 ug/kg/min via INTRAVENOUS
  Administered 2024-09-15: 80 ug/kg/min via INTRAVENOUS
  Administered 2024-09-15: 70 ug/kg/min via INTRAVENOUS
  Administered 2024-09-15 (×2): 80 ug/kg/min via INTRAVENOUS
  Administered 2024-09-16 (×4): 70 ug/kg/min via INTRAVENOUS
  Filled 2024-09-08 (×4): qty 100
  Filled 2024-09-08: qty 300
  Filled 2024-09-08 (×3): qty 100
  Filled 2024-09-08 (×2): qty 200
  Filled 2024-09-08: qty 100
  Filled 2024-09-08: qty 300
  Filled 2024-09-08 (×6): qty 100
  Filled 2024-09-08: qty 200
  Filled 2024-09-08 (×15): qty 100
  Filled 2024-09-08: qty 200
  Filled 2024-09-08: qty 100
  Filled 2024-09-08: qty 200
  Filled 2024-09-08 (×5): qty 100
  Filled 2024-09-08 (×2): qty 200
  Filled 2024-09-08 (×2): qty 100
  Filled 2024-09-08: qty 200

## 2024-09-08 NOTE — ED Provider Notes (Signed)
 [place] [facility]ERGENC[facility] DEPARTMENT AT [facility] Provider Note   CSN: [id number] Arrival date & time: 09/08/24  1034     Patient presents with: Cardiac Arrest   [name] is a 58 y.o. male.   HPI Pt found at gas station ,pt arrived there at [phone] and went to the [facility] at [phone]. Pt was found by staff unconscious at 0900. CPR started at 0911, pt received 18 min total of CPR. 1 epi given, 50 mcgs of fentanyl , 500cc of NS, and and 40 mcgs of epi given total. Hx of diabetes.   Patient unresponsive on arrival, level 5 caveat.    Prior to Admission medications   Medication Sig Start Date End Date Taking? Authorizing Provider  aspirin  325 MG tablet Take 325 mg by mouth daily.   Yes [provider]  carvedilol  (COREG ) 25 MG tablet Take 25 mg by mouth 2 (two) times daily with a meal.   Yes [provider]  chlorthalidone  (HYGROTON ) 25 MG tablet Take 12.5 mg by mouth daily.   Yes [provider]  empagliflozin  (JARDIANCE ) 25 MG TABS tablet Take 25 mg by mouth daily.   Yes [provider]  hydrALAZINE  (APRESOLINE ) 25 MG tablet Take 25 mg by mouth 3 (three) times daily.   Yes [provider]  insulin  aspart (NOVOLOG  FLEXPEN) 100 UNIT/ML FlexPen Inject 8-10 Units into the skin 3 (three) times daily with meals.   Yes [provider]  insulin  glargine (LANTUS ) 100 UNIT/ML Solostar Pen Inject 40 Units into the skin daily.   Yes [provider]  metoprolol  succinate (TOPROL -XL) 100 MG 24 hr tablet Take 50 mg by mouth daily.   Yes [provider]  omeprazole  (PRILOSEC) 40 MG capsule Take 40 mg by mouth daily.   Yes [provider]  polyethylene glycol (MIRALAX / GLYCOLAX) 17 g packet Take 17 g by mouth at bedtime.   Yes [provider]  PSYLLIUM PO Take 15 mLs by mouth 2 (two) times daily as needed (constipation).   Yes [provider]  rosuvastatin  (CRESTOR ) 40 MG tablet Take 1 tablet (40  mg total) by mouth daily. 07/11/22  Yes Maryjane Mt, MD  sacubitril -valsartan  (ENTRESTO ) 49-51 MG Take 1 tablet by mouth 2 (two) times daily. 10/07/22  Yes Rolan Ezra RAMAN, MD  chlorproMAZINE  (THORAZINE ) 10 MG tablet Take 1 tablet (10 mg total) by mouth 3 (three) times daily as needed for hiccoughs. 11/05/22   Pokhrel, Laxman, MD  ergocalciferol (VITAMIN D2) 1.25 MG (50000 UT) capsule Take 50,000 Units by mouth once a week. Sundays    [provider]  furosemide  (LASIX ) 40 MG tablet Take 1 tablet (40 mg total) by mouth daily. 07/10/22 07/10/23  Marcine Catalan M, PA-C  glipiZIDE  (GLUCOTROL ) 5 MG tablet Take 5 mg by mouth daily. 30 minutes prior to a meal    [provider]  lisinopril  (ZESTRIL ) 40 MG tablet Take 40 mg by mouth daily.    [provider]  metFORMIN  (GLUCOPHAGE -XR) 500 MG 24 hr tablet Take 500 mg by mouth 2 (two) times daily with a meal. 06/19/22   [provider]  nicotine  (NICODERM CQ  - DOSED IN MG/24 HR) 7 mg/24hr patch Place 1 patch (7 mg total) onto the skin daily. 11/05/22   Pokhrel, Laxman, MD  ondansetron  (ZOFRAN ) 4 MG tablet Take 1 tablet (4 mg total) by mouth every 6 (six) hours as needed for nausea. 11/05/22   Pokhrel, Laxman, MD  pantoprazole  (PROTONIX ) 40  MG tablet Take 1 tablet (40 mg total) by mouth daily. 10/17/23 11/16/23  Long, Joshua G, MD  potassium chloride  SA (KLOR-CON  M) 20 MEQ tablet Take 1 tablet (20 mEq total) by mouth daily. 07/11/22   Maryjane Mt, MD  promethazine  (PHENERGAN ) 25 MG tablet Take 1 tablet (25 mg total) by mouth every 6 (six) hours as needed for nausea or vomiting. 10/17/23   Long, Fonda MATSU, MD  senna-docusate (SENOKOT-S) 8.6-50 MG tablet Take 1 tablet by mouth at bedtime as needed for mild constipation or moderate constipation. 10/17/23   Long, Joshua G, MD  sildenafil (VIAGRA) 100 MG tablet Take 50 mg by mouth daily as needed for erectile dysfunction.    [provider]  spironolactone  (ALDACTONE ) 25  MG tablet Take 1 tablet (25 mg total) by mouth daily. 07/11/22   Maryjane Mt, MD  thiamine  (VITAMIN B-1) 100 MG tablet Take 1 tablet (100 mg total) by mouth daily. 11/05/22   Pokhrel, Laxman, MD    Allergies: Patient has no known allergies.    Review of Systems  Updated Vital Signs BP (!) 80/61   Pulse 72   Temp (!) 91.2 F (32.9 C)   Resp 20   Ht 1.803 m (5' 11)   SpO2 100%   BMI 20.61 kg/m   Physical Exam Vitals and nursing note reviewed.  Constitutional:      General: He is in acute distress.     Appearance: He is well-developed.  HENT:     Head: Normocephalic and atraumatic.  Eyes:     Conjunctiva/sclera: Conjunctivae normal.  Cardiovascular:     Rate and Rhythm: Normal rate and regular rhythm.  Pulmonary:     Comments: Audible breath sounds bilaterally with mechanical ventilation via ET tube Chest:    Abdominal:     General: There is no distension.  Skin:    General: Skin is warm and dry.  Neurological:     Comments: Unresponsive, pinpoint pupils  Psychiatric:        Cognition and Memory: Cognition is impaired. Memory is impaired.     (all labs ordered are listed, but only abnormal results are displayed) Labs Reviewed  I-STAT CG4 LACTIC ACID, ED - Abnormal; Notable for the following components:      Result Value   Lactic Acid, Venous 10.5 (*)    All other components within normal limits  CBG MONITORING, ED - Abnormal; Notable for the following components:   Glucose-Capillary 114 (*)    All other components within normal limits  I-STAT ARTERIAL BLOOD GAS, ED - Abnormal; Notable for the following components:   pCO2 arterial 23.1 (*)    pO2, Arterial 506 (*)    Bicarbonate 13.4 (*)    TCO2 14 (*)    Acid-base deficit 10.0 (*)    Sodium 134 (*)    Calcium , Ion 1.06 (*)    HCT 32.0 (*)    Hemoglobin 10.9 (*)    All other components within normal limits  CULTURE, RESPIRATORY W GRAM STAIN  BLOOD GAS, ARTERIAL  COMPREHENSIVE METABOLIC PANEL WITH GFR   CBC  APTT  PROTIME-INR  MAGNESIUM   PHOSPHORUS  RAPID URINE DRUG SCREEN, HOSP PERFORMED  TRIGLYCERIDES  URINALYSIS, ROUTINE W REFLEX MICROSCOPIC  ETHANOL  TYPE AND SCREEN  TROPONIN I (HIGH SENSITIVITY)    EKG: EKG Interpretation Date/Time:  Thursday September 08 2024 10:39:21 EST Ventricular Rate:  112 PR Interval:  247 QRS Duration:  134 QT Interval:  363 QTC Calculation: 663 R Axis:  41  Text Interpretation: Sinus rhythm Non-specific intra-ventricular conduction delay Baseline wander ST-t wave abnormality Confirmed by Garrick Charleston [phone]) on 09/08/2024 11:25:02 AM  Radiology:  ARCOLA Abd 1 View Result Date: 09/08/2024 EXAM: 1 VIEW XRAY OF THE ABDOMEN 09/08/2024 03:16:00 PM COMPARISON: 07/02/2022 CLINICAL HISTORY: [id number] Encounter for central line care [phone]; [phone] Encounter for orogastric (OG) tube placement [id number] FINDINGS: LINES, TUBES AND DEVICES: Distal tip of the nasogastric tube seen in the expected position of stomach. BOWEL: Nonobstructive bowel gas pattern. SOFT TISSUES: No opaque urinary calculi. BONES: No acute osseous abnormality. IMPRESSION: 1. Distal tip of the nasogastric tube in the expected position of the stomach. Electronically signed by: Lynwood Seip MD 09/08/2024 03:29 PM EST RP Workstation: [id number]   DG CHEST PORT 1 VIEW Result Date: 09/08/2024 EXAM: 1 VIEW(S) XRAY OF THE CHEST 09/08/2024 03:16:00 PM COMPARISON: Comparison is made to a prior study from the same day. CLINICAL HISTORY: Encounter for central line care; Encounter for orogastric (OG) tube placement. FINDINGS: LINES, TUBES AND DEVICES: Left internal jugular catheter with distal tip in the expected position of the cavoatrial junction. Otherwise stable support apparatus. LUNGS AND PLEURA: No focal pulmonary opacity. No pleural effusion. No pneumothorax. HEART AND MEDIASTINUM: No acute abnormality of the cardiac and mediastinal silhouettes. BONES AND SOFT TISSUES: No acute osseous abnormality.  IMPRESSION: 1. Left internal jugular catheter with distal tip at the cavoatrial junction, in expected position. Electronically signed by: Lynwood Seip MD 09/08/2024 03:28 PM EST RP Workstation: [id number]   CT HEAD WO CONTRAST Result Date: 09/08/2024 EXAM: CT HEAD WITHOUT CONTRAST 09/08/2024 12:17:08 PM TECHNIQUE: CT of the head was performed without the administration of intravenous contrast. Automated exposure control, iterative reconstruction, and/or weight based adjustment of the mA/kV was utilized to reduce the radiation dose to as low as reasonably achievable. COMPARISON: CT head 04/27/2015 CLINICAL HISTORY: Mental status change, unknown cause FINDINGS: BRAIN AND VENTRICLES: No acute hemorrhage. Equivocal subtle diffuse loss of gray-white differentiation without mass effect. No hydrocephalus. No extra-axial collection. No mass effect or midline shift. ORBITS: No acute abnormality. SINUSES: No acute abnormality. SOFT TISSUES AND SKULL: No skull fracture. IMPRESSION: 1. Equivocal subtle diffuse loss of gray-white differentiation without mass effect. An MRI could provide more sensitive evaluation for hypoxic/ischemic insult if clinically warranted. Electronically signed by: Gilmore Molt MD 09/08/2024 12:35 PM EST RP Workstation: [id number]   DG Chest Port 1 View Result Date: 09/08/2024 EXAM: 1 VIEW(S) XRAY OF THE CHEST 09/08/2024 10:53:00 AM COMPARISON: 07/21/2022. CLINICAL HISTORY: Cardiac arrest, OG tube placement, patient found down FINDINGS: LINES, TUBES AND DEVICES: Endotracheal tube in place with tip about 7 cm above the carina. Enteric tube in place terminating in the stomach, side hole at the level of the gastric body. Pacer or resuscitation pads over the chest. LUNGS AND PLEURA: Low lung volumes. No focal pulmonary opacity. No pleural effusion. No pneumothorax. HEART AND MEDIASTINUM: No acute abnormality of the cardiac and mediastinal silhouettes. BONES AND SOFT TISSUES: Sternotomy wires and  CABG noted. Chronic left lateral rib fractures. Paucity of bowel gas. IMPRESSION: 1. Support devices in appropriate position. 2. Low lung volumes. 3. No acute cardiopulmonary findings. Electronically signed by: Donnice Mania MD 09/08/2024 11:45 AM EST RP Workstation: [id number]     Procedures   Medications Ordered in the ED  Chlorhexidine  Gluconate Cloth 2 % PADS 6 each (has no administration in time range)  fentaNYL  (SUBLIMAZE ) injection 25-50 mcg ( Intravenous Not Given 09/08/24 1112)  fentaNYL  in NS (77mcg/ml) infusion-PREMIX (100 mcg/hr  Intravenous Rate/Dose Verify 09/08/24 1116)  fentaNYL  (SUBLIMAZE ) bolus via infusion 25-100 mcg (50 mcg Intravenous Bolus from Bag 09/08/24 1108)  norepinephrine  (LEVOPHED ) 4mg  in (0.016 mg/mL) premix infusion (15 mcg/min Intravenous Rate/Dose Verify 09/08/24 1116)  0.9 %  sodium chloride  infusion (250 mLs Intravenous Not Given 09/08/24 1058)  lactated ringers  bolus 1,000 mL (1,000 mLs Intravenous New Bag/Given 09/08/24 1103)  naloxone  (NARCAN ) injection 0.4 mg (0.4 mg Intravenous Given 09/08/24 1104)                                    Medical Decision Making Patient presents in extremis, now with return of spontaneous circulation after CPR following being found unresponsive in a gas station. Patient's initial neuroexam is concerning, patient required switch from epinephrine  to norepinephrine , evaluation of ET tube for correct placement, placement of OG tube, after arrival. I discussed this case at bedside with our cardiology colleague. EMS rhythm strip reviewed, not appropriate for immediate catheterization, patient has labs monitoring continue nor epi as above.   Amount and/or Complexity of Data Reviewed Independent Historian: EMS Labs: ordered. Decision-making details documented in ED Course. Radiology: ordered and independent interpretation performed. Decision-making details documented in ED Course.  Risk OTC  drugs. Prescription drug management. Decision regarding hospitalization.  Lactic acid greater than 10.  Patient now with nor epi drip running, warm fluids running, a line placed. Case discussed with critical care.   Patient required placement of arterial line with the assistance of our respiratory colleagues for accurate blood pressure management, he did require continuous Levophed . After discussing patient's case at bedside with critical care, patient with x-ray, CT with findings concerning for possible anoxic brain injury.  CRITICAL CARE Performed by: Lamar Salen Total critical care time: 45 minutes Critical care time was exclusive of separately billable procedures and treating other patients. Critical care was necessary to treat or prevent imminent or life-threatening deterioration. Critical care was time spent personally by me on the following activities: development of treatment plan with patient and/or surrogate as well as nursing, discussions with consultants, evaluation of patient's response to treatment, examination of patient, obtaining history from patient or surrogate, ordering and performing treatments and interventions, ordering and review of laboratory studies, ordering and review of radiographic studies, pulse oximetry and re-evaluation of patient's condition.   Final diagnoses:  Cardiac arrest [facility])    ED Discharge Orders     None          Salen Lamar, MD 09/08/24 [phone]

## 2024-09-08 NOTE — H&P (Signed)
 NAME:  [name], MRN:  [id number], DOB:  08-Mar-1966, LOS: 0 ADMISSION DATE:  09/08/2024, CONSULTATION DATE:  09/08/24 REFERRING MD:  Garrick SAUNDERS, CHIEF COMPLAINT:  post-cardiac arrest   History of Present Illness:  [name] is a 58 year old male with past medical history significant for multivessel CAD, HTN, HLD, type 2 DM who presented via EMS following cardiac arrest. Patient was found down in gas station bathroom for approx 33 minutes. ROSC achieved after 18 minutes of CPR, 1 Epi, 50 mcg Fentanyl , 500ml NS. Patient arrived via EMS intubated. In ED, patient received Narcan  with no improvement in neurological status, an arterial line was placed for hypotension, and patient was started on vasopressors. PCCM consulted for ICU evaluation post-cardiac arrest.  Pertinent Medical History:   Past Medical History:  Diagnosis Date   Acid reflux    Concussion    Coronary artery disease    Diabetes (HCC)    ETOH abuse    High cholesterol    Hypertension    MI (myocardial infarction) (HCC)    2012   Sleep apnea    USES CPAP AS NEEDED   Tobacco abuse    Significant Hospital Events: Including procedures, antibiotic start and stop dates in addition to other pertinent events   Admit to ICU post-cardiac arrest; down approx 33 minutes>ROSC achieved after 18 minutes CPR>intubated>started on vasopressors  Interim History / Subjective:  PCCM consulted for ICU evaluation  Objective    Blood pressure 115/88, pulse 70, temperature (!) 93.6 F (34.2 C), resp. rate 17, height 5' 11 (1.803 m), SpO2 100%.    Vent Mode: PRVC FiO2 (%):  [100 %] 100 % Set Rate:  [22 bmp] 22 bmp Vt Set:  [600 mL] 600 mL PEEP:  [5 cmH20] 5 cmH20 Plateau Pressure:  [15 cmH20] 15 cmH20  No intake or output data in the 24 hours ending 09/08/24 1211 There were no vitals filed for this visit.  Examination: General: critically-ill, in NA, intubated and sedated HEENT: AT/[place], pinpoint pupils, mmm Pulm:  ETT, ventilator-assisted breaths, rhonchi bilaterally CV: RRR, no m/g/r GI: soft, non distended Neuro: GCS E1 V1 M1 , intubated, myoclonic jerking  Resolved Problem List:   Assessment and Plan:   Post-cardiac arrest unknown etiology Acute respiratory failure in post-cardiac arrest setting Acute encephalopathy s/p cardiac arrest ETOH/tobacco abuse CAD s/p CABG x4; combined HF HTN HLD DM2 OSA -Continue ventilator support to maintain SpO2 >94% -LPV -VAP bundle -Continue pressors for MAP goal >65 -Hold home antihypertensives/GDMT -IV fluids held for now given cardiac history; will evaluate volume status -Trend troponin and lactate -ABG 7.37/23/506/13.4; continue current vent settings -CMP, Mag, ionized calcium : replete electrolytes as needed -ETOH 158; folic acid /multivitamin/thiamine  ordered -TTE pending; last TTE 09/2022 LVEF 40-45%; global hypokinesis -CT head w/ diffuse loss of grey/white differentiation c/f anoxic brain injury; will need MRI within 72 hours -UDS pending -Neurology consulted; EEG pending; Keppra  loaded; AEDs per Neuro; consider propofol  for continued myoclonic jerking -Sedation held at this time to evaluate neurological status -Empirically started on Rocephin  for c/f aspiration; -Trach aspirate pending -CXR normal; advancing ETT tube -TTM normothermia protocol in place -Hold DVT ppx  Labs:  CBC: Recent Labs  Lab 09/08/24 1044 09/08/24 1049  WBC  --  1.9*  HGB 10.9* 10.9*  HCT 32.0* 32.5*  MCV  --  100.9*  PLT  --  147*    Basic Metabolic Panel: Recent Labs  Lab 09/08/24 1044  NA 134*  K 3.5  GFR: CrCl cannot be calculated (Patient's most recent lab result is older than the maximum 21 days allowed.). Recent Labs  Lab 09/08/24 1049 09/08/24 1050  WBC 1.9*  --   LATICACIDVEN  --  10.5*    Liver Function Tests: No results for input(s): AST, ALT, ALKPHOS, BILITOT, PROT, ALBUMIN  in the last 168 hours. No results for  input(s): LIPASE, AMYLASE in the last 168 hours. No results for input(s): AMMONIA in the last 168 hours.  ABG    Component Value Date/Time   PHART 7.371 09/08/2024 1044   PCO2ART 23.1 (L) 09/08/2024 1044   PO2ART 506 (H) 09/08/2024 1044   HCO3 13.4 (L) 09/08/2024 1044   TCO2 14 (L) 09/08/2024 1044   ACIDBASEDEF 10.0 (H) 09/08/2024 1044   O2SAT 100 09/08/2024 1044     Coagulation Profile: Recent Labs  Lab 09/08/24 1049  INR 1.1    Cardiac Enzymes: No results for input(s): CKTOTAL, CKMB, CKMBINDEX, TROPONINI in the last 168 hours.  HbA1C: Hgb A1c MFr Bld  Date/Time Value Ref Range Status  07/03/2022 07:35 PM 7.9 (H) 4.8 - 5.6 % Final    Comment:    (NOTE) Pre diabetes:          5.7%-6.4%  Diabetes:              >6.4%  Glycemic control for   <7.0% adults with diabetes   06/24/2022 10:46 AM 8.2 (H) 4.8 - 5.6 % Final    Comment:    (NOTE) Pre diabetes:          5.7%-6.4%  Diabetes:              >6.4%  Glycemic control for   <7.0% adults with diabetes     CBG: Recent Labs  Lab 09/08/24 1117  GLUCAP 114*    Review of Systems:   Unable to obtain, patient is s/p intubation Past Medical History:  He,  has a past medical history of Acid reflux, Concussion, Coronary artery disease, Diabetes (HCC), ETOH abuse, High cholesterol, Hypertension, MI (myocardial infarction) (HCC), Sleep apnea, and Tobacco abuse.   Surgical History:   Past Surgical History:  Procedure Laterality Date   BIOPSY  07/01/2022   Procedure: BIOPSY;  Surgeon: San Sandor GAILS, DO;  Location: [facility];  Service: Gastroenterology;;   COLONOSCOPY N/A 07/01/2022   Procedure: COLONOSCOPY;  Surgeon: San Sandor GAILS, DO;  Location: [facility];  Service: Gastroenterology;  Laterality: N/A;   CORONARY ARTERY BYPASS GRAFT N/A 07/04/2022   Procedure: CORONARY ARTERY BYPASS GRAFTING (CABG) X4, USING LEFT GREATER SAPHENOUS VEIN.;  Surgeon: Maryjane Mt, MD;  Location: [facility] OR;   Service: Open Heart Surgery;  Laterality: N/A;   ESOPHAGOGASTRODUODENOSCOPY (EGD) WITH PROPOFOL  N/A 07/01/2022   Procedure: ESOPHAGOGASTRODUODENOSCOPY (EGD) WITH PROPOFOL ;  Surgeon: San Sandor GAILS, DO;  Location: [facility];  Service: Gastroenterology;  Laterality: N/A;   HEMOSTASIS CLIP PLACEMENT  07/01/2022   Procedure: HEMOSTASIS CLIP PLACEMENT;  Surgeon: San Sandor GAILS, DO;  Location: [facility];  Service: Gastroenterology;;   KNEE ARTHROSCOPY     ORIF ULNAR FRACTURE Right 12/31/2016   Procedure: OPEN REDUCTION INTERNAL FIXATION (ORIF) both bone forearm fracture;  Surgeon: Elsie Mussel, MD;  Location: [facility] OR;  Service: Orthopedics;  Laterality: Right;   POLYPECTOMY  07/01/2022   Procedure: POLYPECTOMY;  Surgeon: San Sandor GAILS, DO;  Location: [facility];  Service: Gastroenterology;;   RIGHT/LEFT HEART CATH AND CORONARY ANGIOGRAPHY N/A 06/25/2022   Procedure: RIGHT/LEFT HEART CATH AND CORONARY ANGIOGRAPHY;  Surgeon: Wonda,  Ozell, MD;  Location: [facility] INVASIVE CV LAB;  Service: Cardiovascular;  Laterality: N/A;   stents     TEE WITHOUT CARDIOVERSION N/A 06/25/2022   Procedure: TRANSESOPHAGEAL ECHOCARDIOGRAM (TEE);  Surgeon: Mona Vinie BROCKS, MD;  Location: [facility] ENDOSCOPY;  Service: Cardiovascular;  Laterality: N/A;   TEE WITHOUT CARDIOVERSION N/A 07/04/2022   Procedure: TRANSESOPHAGEAL ECHOCARDIOGRAM (TEE);  Surgeon: Maryjane Mt, MD;  Location: [facility] OR;  Service: Open Heart Surgery;  Laterality: N/A;     Social History:   reports that he has been smoking cigarettes. He has a 7.5 pack-year smoking history. He has never used smokeless tobacco. He reports current alcohol use of about 6.0 standard drinks of alcohol per week. He reports that he does not use drugs.   Family History:  His family history includes Diabetes in his mother.   Allergies No Known Allergies   Home Medications  Prior to Admission medications   Medication Sig Start Date End Date Taking? Authorizing Provider   aspirin  325 MG tablet Take 325 mg by mouth daily.   Yes [provider]  carvedilol  (COREG ) 25 MG tablet Take 25 mg by mouth 2 (two) times daily with a meal.   Yes [provider]  chlorthalidone  (HYGROTON ) 25 MG tablet Take 12.5 mg by mouth daily.   Yes [provider]  empagliflozin  (JARDIANCE ) 25 MG TABS tablet Take 25 mg by mouth daily.   Yes [provider]  hydrALAZINE  (APRESOLINE ) 25 MG tablet Take 25 mg by mouth 3 (three) times daily.   Yes [provider]  insulin  aspart (NOVOLOG  FLEXPEN) 100 UNIT/ML FlexPen Inject 8-10 Units into the skin 3 (three) times daily with meals.   Yes [provider]  insulin  glargine (LANTUS ) 100 UNIT/ML Solostar Pen Inject 40 Units into the skin daily.   Yes [provider]  metoprolol  succinate (TOPROL -XL) 100 MG 24 hr tablet Take 50 mg by mouth daily.   Yes [provider]  omeprazole  (PRILOSEC) 40 MG capsule Take 40 mg by mouth daily.   Yes [provider]  polyethylene glycol (MIRALAX  / GLYCOLAX ) 17 g packet Take 17 g by mouth at bedtime.   Yes [provider]  PSYLLIUM PO Take 15 mLs by mouth 2 (two) times daily as needed (constipation).   Yes [provider]  rosuvastatin  (CRESTOR ) 40 MG tablet Take 1 tablet (40 mg total) by mouth daily. 07/11/22  Yes Maryjane Mt, MD  sacubitril -valsartan  (ENTRESTO ) 49-51 MG Take 1 tablet by mouth 2 (two) times daily. 10/07/22  Yes Rolan Ezra RAMAN, MD  chlorproMAZINE  (THORAZINE ) 10 MG tablet Take 1 tablet (10 mg total) by mouth 3 (three) times daily as needed for hiccoughs. 11/05/22   Pokhrel, Laxman, MD  ergocalciferol (VITAMIN D2) 1.25 MG (50000 UT) capsule Take 50,000 Units by mouth once a week. Sundays    [provider]  furosemide  (LASIX ) 40 MG tablet Take 1 tablet (40 mg total) by mouth daily. 07/10/22 07/10/23  Marcine Catalan M, PA-C  glipiZIDE  (GLUCOTROL ) 5 MG tablet Take 5 mg by mouth daily. 30 minutes  prior to a meal    [provider]  lisinopril  (ZESTRIL ) 40 MG tablet Take 40 mg by mouth daily.    [provider]  metFORMIN  (GLUCOPHAGE -XR) 500 MG 24 hr tablet Take 500 mg by mouth 2 (two) times daily with a meal. 06/19/22   [provider]  nicotine  (NICODERM CQ  - DOSED IN MG/24 HR) 7 mg/24hr patch Place 1 patch (7 mg total) onto  the skin daily. 11/05/22   Pokhrel, Vernal, MD  ondansetron  (ZOFRAN ) 4 MG tablet Take 1 tablet (4 mg total) by mouth every 6 (six) hours as needed for nausea. 11/05/22   Pokhrel, Laxman, MD  pantoprazole  (PROTONIX ) 40 MG tablet Take 1 tablet (40 mg total) by mouth daily. 10/17/23 11/16/23  Long, Fonda MATSU, MD  potassium chloride  SA (KLOR-CON  M) 20 MEQ tablet Take 1 tablet (20 mEq total) by mouth daily. 07/11/22   Maryjane Mt, MD  promethazine  (PHENERGAN ) 25 MG tablet Take 1 tablet (25 mg total) by mouth every 6 (six) hours as needed for nausea or vomiting. 10/17/23   Long, Fonda MATSU, MD  senna-docusate (SENOKOT-S) 8.6-50 MG tablet Take 1 tablet by mouth at bedtime as needed for mild constipation or moderate constipation. 10/17/23   Long, Joshua G, MD  sildenafil (VIAGRA) 100 MG tablet Take 50 mg by mouth daily as needed for erectile dysfunction.    [provider]  spironolactone  (ALDACTONE ) 25 MG tablet Take 1 tablet (25 mg total) by mouth daily. 07/11/22   Maryjane Mt, MD  thiamine  (VITAMIN B-1) 100 MG tablet Take 1 tablet (100 mg total) by mouth daily. 11/05/22   Sonjia Vernal, MD     Critical care time: 40 minutes   Valiant Dills, DNP, AGACNP-BC [facility]  Please see [email or web address] for pager details.  From 7A-7P if no response, please call [phone]. After hours, please call ELink [phone].

## 2024-09-08 NOTE — Progress Notes (Signed)
 Attempted STAT echo at 10:45am per MD, RN stated that echo was not needed at this time. Will reschedule for another time.   Koleen Popper, RDCS

## 2024-09-08 NOTE — ED Triage Notes (Signed)
 [place] found [place] [facility] ,pt arrived there at 0837 and went to the BR at [phone]. Pt was found by staff unconscious at 0900. CPR started at 0911, pt received 18 min total of CPR. 1 epi given, 50 mcgs of fentanyl , 500cc of NS, and and 40 mcgs of epi given total. Hx of diabetes.

## 2024-09-08 NOTE — Consult Note (Addendum)
 Cardiology Consultation   Patient ID: [name] MRN: [id number]; DOB: 11-Aug-1966  Admit date: 09/08/2024 Date of Consult: 09/08/2024  PCP:  Salman, Faiza, MD   [facility] Providers Cardiologist:  Dr. Ezra Shuck   Patient Profile: [name] is a 58 y.o. male with a hx of multivessel CAD, HTN, HLD, type 2 DM,  who is being seen 09/08/2024 for the evaluation after a cardiac arrest at the request of Dr. Olena.  History of Present Illness: [name] is a 58 year old male with above medical history. Patient was previously followed by Dr. Shuck with advanced heart failure, but has not been seen by cardiology since 2023.   Patient had a prior MI in 2012 with multivessel stenting. Later admitted to [facility] in 06/2022 with an NSTEMI. Echocardiogram 06/24/22 showed EF 40-45% with regional wall motion abnormalities, grade III DD, normal RV systolic function, severely elevated PA systolic pressure, severe MR. Underwent TEE on 06/25/22 that showed moderate-severe mitral valve regurgitation due to tethering of the posterior leaflet. Underwent R/L heart cath that showed advanced multivessel coronary artery disease with severe distal left main disease, very severe ostial circumflex stenosis, subtotal occlusion of the mid circumflex with occlusion of the OM branched. There was also severe in-stent restenosis in the mid LAD, diffuse distal LAD disease, severe mid RCA stenosis. Patient was started on milrinone  as best approach for revascularization was discussed.   cMRI on 07/02/22 showed findings consistent with multi-vessel disease. Aside from the LF true apex, most of the myocardium appeared viable. LVEF was 29% on cMRI.   Patient ultimately underwent CABGx4 with  LIMA-LAD, RSVG from aorta to RPDA (acute marginal extension), RSVG from aorta to Ramus sequenced to the distal OM. Perioperative TEE showed only mild-mod MR. No indication for MVR. Started on GDMT. Seen by advanced  heart failure twice after his admission, but [facility]   Most recent echo from 09/2022 showed EF 40-45%, grade I DD, moderately reduced RV systolic function, no MR.   Patient presented to the ED after a cardiac arrest. He had been at a gas station, went to the bathroom at [phone]. Was found by staff unconscious at 0900. CPR was started att 0911. Received 18 minutes of CPR. He was intubated by EMS.   Patient currently intubated and sedated.  No family at bedside.   Past Medical History:  Diagnosis Date   Acid reflux    Concussion    Coronary artery disease    Diabetes (HCC)    ETOH abuse    High cholesterol    Hypertension    MI (myocardial infarction) (HCC)    2012   Sleep apnea    USES CPAP AS NEEDED   Tobacco abuse     Past Surgical History:  Procedure Laterality Date   BIOPSY  07/01/2022   Procedure: BIOPSY;  Surgeon: San Sandor GAILS, DO;  Location: [facility] ENDOSCOPY;  Service: Gastroenterology;;   COLONOSCOPY N/A 07/01/2022   Procedure: COLONOSCOPY;  Surgeon: San Sandor GAILS, DO;  Location: [facility];  Service: Gastroenterology;  Laterality: N/A;   CORONARY ARTERY BYPASS GRAFT N/A 07/04/2022   Procedure: CORONARY ARTERY BYPASS GRAFTING (CABG) X4, USING LEFT GREATER SAPHENOUS VEIN.;  Surgeon: Maryjane Mt, MD;  Location: [facility] OR;  Service: Open Heart Surgery;  Laterality: N/A;   ESOPHAGOGASTRODUODENOSCOPY (EGD) WITH PROPOFOL  N/A 07/01/2022   Procedure: ESOPHAGOGASTRODUODENOSCOPY (EGD) WITH PROPOFOL ;  Surgeon: San Sandor GAILS, DO;  Location: [facility];  Service:  Gastroenterology;  Laterality: N/A;   HEMOSTASIS CLIP PLACEMENT  07/01/2022   Procedure: HEMOSTASIS CLIP PLACEMENT;  Surgeon: San Sandor GAILS, DO;  Location: [facility];  Service: Gastroenterology;;   KNEE ARTHROSCOPY     ORIF ULNAR FRACTURE Right 12/31/2016   Procedure: OPEN REDUCTION INTERNAL FIXATION (ORIF) both bone forearm fracture;  Surgeon: Elsie Mussel, MD;  Location:  [facility] OR;  Service: Orthopedics;  Laterality: Right;   POLYPECTOMY  07/01/2022   Procedure: POLYPECTOMY;  Surgeon: San Sandor GAILS, DO;  Location: [facility];  Service: Gastroenterology;;   RIGHT/LEFT HEART CATH AND CORONARY ANGIOGRAPHY N/A 06/25/2022   Procedure: RIGHT/LEFT HEART CATH AND CORONARY ANGIOGRAPHY;  Surgeon: Wonda Sharper, MD;  Location: [facility] INVASIVE CV LAB;  Service: Cardiovascular;  Laterality: N/A;   stents     TEE WITHOUT CARDIOVERSION N/A 06/25/2022   Procedure: TRANSESOPHAGEAL ECHOCARDIOGRAM (TEE);  Surgeon: Mona Vinie BROCKS, MD;  Location: [facility] ENDOSCOPY;  Service: Cardiovascular;  Laterality: N/A;   TEE WITHOUT CARDIOVERSION N/A 07/04/2022   Procedure: TRANSESOPHAGEAL ECHOCARDIOGRAM (TEE);  Surgeon: Maryjane Mt, MD;  Location: [facility] OR;  Service: Open Heart Surgery;  Laterality: N/A;     Scheduled Meds:  amiodarone   150 mg Intravenous Once   Chlorhexidine  Gluconate Cloth  6 each Topical Daily   docusate  100 mg Per Tube BID   famotidine   20 mg Per Tube BID   fentaNYL  (SUBLIMAZE ) injection  25-50 mcg Intravenous Once   folic acid   1 mg Per Tube Daily   insulin  aspart  0-9 Units Subcutaneous Q4H   levETIRAcetam  60 mg/kg Intravenous Once   multivitamin with minerals  1 tablet Per Tube Daily   nicotine   7 mg Transdermal Daily   polyethylene glycol  17 g Per Tube Daily   potassium chloride   40 mEq Per Tube Once   thiamine  (VITAMIN B1) injection  100 mg Intravenous Daily   Continuous Infusions:  sodium chloride      sodium chloride      sodium chloride      amiodarone      Followed by   amiodarone      cefTRIAXone  (ROCEPHIN )  IV 2 g (09/08/24 1303)   fentaNYL  infusion INTRAVENOUS 150 mcg/hr (09/08/24 1205)   norepinephrine  (LEVOPHED ) Adult infusion 15 mcg/min (09/08/24 1116)   propofol  (DIPRIVAN ) infusion 10 mcg/kg/min (09/08/24 1257)   PRN Meds: Place/Maintain arterial line **AND** sodium chloride , acetaminophen , docusate, fentaNYL , perflutren lipid microspheres  (DEFINITY) IV suspension, polyethylene glycol  Allergies:   No Known Allergies  Social History:   Social History   Socioeconomic History   Marital status: Divorced    Spouse name: Not on file   Number of children: Not on file   Years of education: Not on file   Highest education level: Not on file  Occupational History   Not on file  Tobacco Use   Smoking status: Every Day    Current packs/day: 0.25    Average packs/day: 0.3 packs/day for 30.0 years (7.5 ttl pk-yrs)    Types: Cigarettes   Smokeless tobacco: Never  Vaping Use   Vaping status: Never Used  Substance and Sexual Activity   Alcohol use: Yes    Alcohol/week: 6.0 standard drinks of alcohol    Types: 6 Cans of beer per week    Comment: occassionally on weekends   Drug use: No   Sexual activity: Not on file  Other Topics Concern   Not on file  Social History Narrative   Not on file   Social Drivers of Health  Financial Resource Strain: Not on file  Food Insecurity: No Food Insecurity (11/03/2022)   Hunger Vital Sign    Worried About Running Out of Food in the Last Year: Never true    Ran Out of Food in the Last Year: Never true  Transportation Needs: No Transportation Needs (11/03/2022)   PRAPARE - Administrator, Civil Service (Medical): No    Lack of Transportation (Non-Medical): No  Physical Activity: Not on file  Stress: Not on file  Social Connections: Not on file  Intimate Partner Violence: Not At Risk (11/03/2022)   Humiliation, Afraid, Rape, and Kick questionnaire    Fear of Current or Ex-Partner: No    Emotionally Abused: No    Physically Abused: No    Sexually Abused: No    Family History:   Family History  Problem Relation Age of Onset   Diabetes Mother      ROS:  Please see the history of present illness.  All other ROS reviewed and negative.     Physical Exam/Data: Vitals:   09/08/24 1150 09/08/24 1155 09/08/24 1200 09/08/24 1256  BP: (!) 121/94 (!) 123/94 115/88    Pulse: 66 67 70   Resp: 20 20 17    Temp: (!) 93.7 F (34.3 C) (!) 93.6 F (34.2 C) (!) 93.6 F (34.2 C)   SpO2: 100% 100% 100% 100%  Weight:   67 kg   Height:        Intake/Output Summary (Last 24 hours) at 09/08/2024 1519 Last data filed at 09/08/2024 1343 Gross per 24 hour  Intake --  Output 20 ml  Net -20 ml      09/08/2024   12:00 PM 06/14/2024    8:14 PM 10/17/2023    9:40 AM  Last 3 Weights  Weight (lbs) 147 lb 11.3 oz 147 lb 12.8 oz 148 lb  Weight (kg) 67 kg 67.042 kg 67.132 kg     Body mass index is 20.6 kg/m.  General: Ill appearing middle aged male. Intubated, sedated  HEENT: normal Neck: no JVD Cardiac:  normal S1, S2; RRR; no murmur  Lungs:  coarse breath sounds throughout  Abd: soft, not distended  Ext: no edema in BLE Musculoskeletal:  No deformities Skin: warm and dry  Neuro:  CNs 2-12 intact, no focal abnormalities noted Psych:  Normal affect   EKG:  The EKG was personally reviewed and demonstrates:  sinus rhythm, significant artifact making interpretation difficult  Telemetry:  Telemetry was personally reviewed and demonstrates:  Sinus rhythm with occasional PVCs, one short run of NSVT lasting 5 beats   Relevant CV Studies: Cardiac Studies & Procedures   ______________________________________________________________________________________________ CARDIAC CATHETERIZATION  CARDIAC CATHETERIZATION 06/25/2022  Conclusion   Mid LM to Dist LM lesion is 75% stenosed.   Ost Cx to Prox Cx lesion is 90% stenosed.   Mid RCA lesion is 80% stenosed.   Prox RCA lesion is 50% stenosed.   Mid LAD lesion is 75% stenosed.   Mid Cx to Dist Cx lesion is 99% stenosed.   3rd Mrg lesion is 90% stenosed.   Ramus lesion is 80% stenosed.   Dist LAD lesion is 70% stenosed.  1.  Advanced multivessel cor Neri artery disease with severe distal left main disease, very severe ostial circumflex stenosis, subtotal occlusion of the mid circumflex with occlusion of the OM  branches, severe in-stent restenosis in the mid LAD, diffuse distal LAD disease, and severe mid RCA stenosis 2.  Severe mitral regurgitation by noninvasive  assessment and by hemodynamics with 34 mm V waves in the pulmonary wedge tracing 3.  Congestive heart failure with elevated wedge pressure of 26, low PA oxygen  saturation of 49%, but preserved cardiac output of 5.4 L/min by Fick assessment 4.  Successful placement of right internal jugular triple-lumen catheter for infusion of milrinone  and central monitoring if needed  Plan: med rx initially. Will have to sort out best approach at revascularization and treatment of MR. Advanced HF team consulted.  Findings Coronary Findings Diagnostic  Dominance: Right  Left Main Mid LM to Dist LM lesion is 75% stenosed. The lesion is eccentric. The lesion is calcified.  Left Anterior Descending Mid LAD lesion is 75% stenosed. The lesion was previously treated using a drug eluting stent over 2 years ago. Dist LAD lesion is 70% stenosed.  Ramus Intermedius Ramus lesion is 80% stenosed.  Left Circumflex Ost Cx to Prox Cx lesion is 90% stenosed. Mid Cx to Dist Cx lesion is 99% stenosed. The lesion is severely calcified. There is dense calcium  and near subtotal occlusion at this lesion site in the mid circumflex  Third Obtuse Marginal Branch 3rd Mrg lesion is 90% stenosed.  Right Coronary Artery Prox RCA lesion is 50% stenosed. Mid RCA lesion is 80% stenosed.  Intervention  No interventions have been documented.     ECHOCARDIOGRAM  ECHOCARDIOGRAM COMPLETE 10/07/2022  Narrative ECHOCARDIOGRAM REPORT    Patient Name:   [name] Date of Exam: 10/07/2022 Medical Rec #:  [id number]            Height:       72.0 in Accession #:    [id number]           Weight:       154.1 lb Date of Birth:  [birth year]             BSA:          1.907 m Patient Age:    56 years             BP:           137/90 mmHg Patient Gender: M                     HR:           89 bpm. Exam Location:  Outpatient  Procedure: 2D Echo, 3D Echo, Color Doppler, Cardiac Doppler and Strain Analysis  Indications:    Congestive Heart Failure I50.9  History:        Patient has prior history of Echocardiogram examinations, most recent 07/08/2022. CHF, CAD and Previous Myocardial Infarction, Prior CABG; Risk Factors:Hypertension, Diabetes, Current Smoker, Dyslipidemia and Sleep Apnea.  Sonographer:    Madeline Finder Referring Phys: [phone] JESSICA M MILFORD   Sonographer Comments: Image acquisition challenging due to respiratory motion. IMPRESSIONS   1. Left ventricular ejection fraction, by estimation, is 40 to 45%. The left ventricle has mildly decreased function. The left ventricle demonstrates global hypokinesis looking worse at the apex. Left ventricular diastolic parameters are consistent with Grade I diastolic dysfunction (impaired relaxation). 2. Right ventricular systolic function is moderately reduced. The right ventricular size is normal. There is normal pulmonary artery systolic pressure. The estimated right ventricular systolic pressure is 11.4 mmHg. 3. The mitral valve is normal in structure. No evidence of mitral valve regurgitation. No evidence of mitral stenosis. 4. The aortic valve is tricuspid. Aortic valve regurgitation is not visualized. No aortic stenosis is present. 5. The inferior vena  cava is normal in size with greater than 50% respiratory variability, suggesting right atrial pressure of 3 mmHg.  FINDINGS Left Ventricle: Left ventricular ejection fraction, by estimation, is 40 to 45%. The left ventricle has mildly decreased function. The left ventricle demonstrates global hypokinesis. The left ventricular internal cavity size was normal in size. There is no left ventricular hypertrophy. Left ventricular diastolic parameters are consistent with Grade I diastolic dysfunction (impaired relaxation).  Right Ventricle: The right  ventricular size is normal. No increase in right ventricular wall thickness. Right ventricular systolic function is moderately reduced. There is normal pulmonary artery systolic pressure. The tricuspid regurgitant velocity is 1.45 m/s, and with an assumed right atrial pressure of 3 mmHg, the estimated right ventricular systolic pressure is 11.4 mmHg.  Left Atrium: Left atrial size was normal in size.  Right Atrium: Right atrial size was normal in size.  Pericardium: There is no evidence of pericardial effusion.  Mitral Valve: The mitral valve is normal in structure. No evidence of mitral valve regurgitation. No evidence of mitral valve stenosis. MV peak gradient, 4.0 mmHg. The mean mitral valve gradient is 1.0 mmHg.  Tricuspid Valve: The tricuspid valve is normal in structure. Tricuspid valve regurgitation is not demonstrated.  Aortic Valve: The aortic valve is tricuspid. Aortic valve regurgitation is not visualized. No aortic stenosis is present.  Pulmonic Valve: The pulmonic valve was normal in structure. Pulmonic valve regurgitation is not visualized.  Aorta: The aortic root is normal in size and structure.  Venous: The inferior vena cava is normal in size with greater than 50% respiratory variability, suggesting right atrial pressure of 3 mmHg.  IAS/Shunts: No atrial level shunt detected by color flow Doppler.   LEFT VENTRICLE PLAX 2D LVIDd:         3.60 cm      Diastology LVIDs:         2.90 cm      LV e' medial:    5.33 cm/s LV PW:         1.10 cm      LV E/e' medial:  9.9 LV IVS:        0.50 cm      LV e' lateral:   8.05 cm/s LVOT diam:     2.10 cm      LV E/e' lateral: 6.5 LV SV:         62 LV SV Index:   32 LVOT Area:     3.46 cm  3D Volume EF: LV Volumes (MOD)            3D EF:        46 % LV vol d, MOD A2C: 162.0 ml LV EDV:       160 ml LV vol d, MOD A4C: 157.0 ml LV ESV:       86 ml LV vol s, MOD A2C: 117.0 ml LV SV:        73 ml LV vol s, MOD A4C: 100.0 ml LV SV  MOD A2C:     45.0 ml LV SV MOD A4C:     157.0 ml LV SV MOD BP:      57.3 ml  RIGHT VENTRICLE RV S prime:     5.66 cm/s TAPSE (M-mode): 0.9 cm  LEFT ATRIUM           Index        RIGHT ATRIUM           Index LA diam:  3.10 cm 1.63 cm/m   RA Area:     15.00 cm LA Vol (A4C): 35.1 ml 18.40 ml/m  RA Volume:   30.30 ml  15.89 ml/m AORTIC VALVE LVOT Vmax:   96.10 cm/s LVOT Vmean:  63.900 cm/s LVOT VTI:    0.178 m  AORTA Ao Root diam: 3.40 cm Ao Asc diam:  3.00 cm  MITRAL VALVE               TRICUSPID VALVE MV Area (PHT): 3.40 cm    TR Peak grad:   8.4 mmHg MV Area VTI:   2.31 cm    TR Vmax:        145.00 cm/s MV Peak grad:  4.0 mmHg MV Mean grad:  1.0 mmHg    SHUNTS MV Vmax:       1.00 m/s    Systemic VTI:  0.18 m MV Vmean:      53.3 cm/s   Systemic Diam: 2.10 cm MV Decel Time: 223 msec MR Peak grad: 7.8 mmHg MR Vmax:      139.20 cm/s MV E velocity: 52.60 cm/s MV A velocity: 75.20 cm/s MV E/A ratio:  0.70  Dalton McleanMD Electronically signed by Ezra Kanner Signature Date/Time: 10/07/2022/10:50:49 AM    Final   TEE  ECHO INTRAOPERATIVE TEE 07/04/2022  Narrative *INTRAOPERATIVE TRANSESOPHAGEAL REPORT *    Patient Name:   [name] Date of Exam: 07/04/2022 Medical Rec #:  [id number]            Height:       72.0 in Accession #:    [id number]           Weight:       156.1 lb Date of Birth:  1966/09/26             BSA:          1.92 m Patient Age:    56 years             BP:           121/78 mmHg Patient Gender: M                    HR:           78 bpm. Exam Location:  Anesthesiology  Transesophogeal exam was perform intraoperatively during surgical procedure. Patient was closely monitored under general anesthesia during the entirety of examination.  Indications:     Mitral Regurgitation i34.0; CAD Native Vessel i25.10 Sonographer:     Damien Senior RDCS Performing Phys: Debby Like MD Diagnosing Phys: Debby Like MD  Complications: No  known complications during this procedure. POST-OP IMPRESSIONS _ Left Ventricle: The wall motion is abnormal with regional variation. Slightly improved lateral wall motion. Inferior wall unchanged. _ Right Ventricle: The right ventricle appears unchanged from pre-bypass. _ Aorta: The aorta appears unchanged from pre-bypass. _ Left Atrium: The left atrium appears unchanged from pre-bypass. _ Left Atrial Appendage: The left atrial appendage appears unchanged from pre-bypass. _ Aortic Valve: The aortic valve appears unchanged from pre-bypass. _ Mitral Valve: The mitral valve appears unchanged from pre-bypass. _ Tricuspid Valve: The tricuspid valve appears unchanged from pre-bypass. _ Pulmonic Valve: The pulmonic valve appears unchanged from pre-bypass. _ Interatrial Septum: The interatrial septum appears unchanged from pre-bypass. _ Interventricular Septum: The interventricular septum appears unchanged from pre-bypass. _ Pericardium: The pericardium appears unchanged from pre-bypass.  PRE-OP FINDINGS Left Ventricle: The left ventricle has mild-moderately reduced systolic function, with  an ejection fraction of 40-45%. The cavity size was normal. There is mild concentric left ventricular hypertrophy.   LV Wall Scoring: Moderate inferor wall hypokinesis, mild-moderate anterolateral/lateral wall hypokinesis.   Right Ventricle: The right ventricle has normal systolic function. The cavity was normal. There is no increase in right ventricular wall thickness.  Left Atrium: Left atrial size was normal in size. No left atrial/left atrial appendage thrombus was detected.  Right Atrium: Right atrial size was normal in size.  Interatrial Septum: No atrial level shunt detected by color flow Doppler.  Pericardium: The pericardium was not assessed.  Mitral Valve: The mitral valve is normal in structure. Mitral valve regurgitation mild-moderate. There is No evidence of mitral stenosis.  Tricuspid  Valve: The tricuspid valve was normal in structure. Tricuspid valve regurgitation was not visualized by color flow Doppler. No evidence of tricuspid stenosis is present.  Aortic Valve: The aortic valve is tricuspid Aortic valve regurgitation was not visualized by color flow Doppler. There is no stenosis of the aortic valve.   Pulmonic Valve: The pulmonic valve was not assessed. Pulmonic valve regurgitation was not assessed by color flow Doppler.   Aorta: The aortic root, ascending aorta and aortic arch are normal in size and structure.   Debby Like MD Electronically signed by Debby Like MD Signature Date/Time: 07/04/2022/1:08:22 PM    Final      CARDIAC MRI  MR CARDIAC MORPHOLOGY W WO CONTRAST 07/02/2022  Narrative CLINICAL DATA:  Clinical question of cardiac viability Study assumes  BSA of 1.90 m2.  EXAM: CARDIAC MRI  TECHNIQUE: The patient was scanned on a 1.5 Tesla GE magnet. A dedicated cardiac coil was used. Functional imaging was done using Fiesta sequences. 2,3, and 4 chamber views were done to assess for RWMA's. Modified Simpson's rule using a short axis stack was used to calculate an ejection fraction on a dedicated work [employer]. The patient received 10 cc of Gadavist . After 10 minutes inversion recovery sequences were used to assess for infiltration and scar tissue.  CONTRAST:  10 cc  of Gadavist   FINDINGS: 1. Moderate dilation in left ventricular size, with LVEDD 56 mm, but LVEDVi 132 mL/m2.  Mild asymmetric septal hypertrophy, with intraventricular septal thickness of 13 mm, posterior wall thickness of 6 mm, but myocardial mass index of 84 g/m2.  Severely decreased left ventricular systolic function (LVEF =29%).  There are regional wall motion abnormalities.  Basal inferior, inferoseptal and inferolateral hypokinesis.  Mid anteroseptal and mid inferolateral and anterolateral hypokinesis.  Severe apical hypokinesis  without LV thrombus.  Left ventricular parametric mapping notable for increase in native T1 in the apical lateral segment (1200 ms) and basal inferolateral segment (1215 ms).  Increased in T2 signal increased basal inferoseptal and inferolateral (61 ms) mid inferior and inferolateral (63 ms), and apical inferior and lateral (70 ms).  There is late gadolinium enhancement in the left ventricular myocardium- 75% true apical LGE.  2. Normal right ventricular size with RVEDVI 64 mL/m2.  Normal right ventricular thickness.  Normal right ventricular systolic function (RVEF =61%). There are no regional wall motion abnormalities or aneurysms.  3.  Normal left and right atrial size.  4. Normal size of the aortic root, ascending aorta and pulmonary artery.  5. Valve assessment:  Aortic Valve: Tri-leaflet aortic valve. There is no significant regurgitation, regurgitant fraction 5%.  Pulmonic Valve: There is no significant regurgitation, regurgitant fraction 3%.  Tricuspid Valve: There is no significant regurgitation.  Mitral Valve: There is mild  to moderate mitral regurgitation, regurgitant fraction 23%. Mechanism is likely posterior leaflet restriction.  6.  Normal pericardium.  No pericardial effusion.  7. Grossly, There is a subdiaphragmatic artifact of unclear etiology. Small bilateral pleural effusions. Recommended dedicated study if concerned for non-cardiac pathology.  IMPRESSION: Study is consistent with multi-vessel disease.  Save for the left ventricular true apex; most of the myocardium appears viable.  Stanly Leavens MD   Electronically Signed By: Stanly Leavens M.D. On: 07/02/2022 18:14   ______________________________________________________________________________________________       Laboratory Data: High Sensitivity Troponin:   Recent Labs  Lab 09/08/24 1049 09/08/24 1343  TROPONINIHS 49* 125*     Chemistry Recent Labs  Lab  09/08/24 1044 09/08/24 1049  NA 134* 133*  K 3.5 3.6  CL  --  96*  CO2  --  15*  GLUCOSE  --  144*  BUN  --  9  CREATININE  --  1.14  CALCIUM   --  8.4*  MG  --  1.6*  GFRNONAA  --  >60  ANIONGAP  --  22*    Recent Labs  Lab 09/08/24 1049  PROT 6.5  ALBUMIN  3.1*  AST 247*  ALT 67*  ALKPHOS 61  BILITOT 0.8   Lipids  Recent Labs  Lab 09/08/24 1049  TRIG 67    Hematology Recent Labs  Lab 09/08/24 1044 09/08/24 1049  WBC  --  1.9*  RBC  --  3.22*  HGB 10.9* 10.9*  HCT 32.0* 32.5*  MCV  --  100.9*  MCH  --  33.9  MCHC  --  33.5  RDW  --  12.4  PLT  --  147*   Thyroid  No results for input(s): TSH, FREET4 in the last 168 hours.  BNPNo results for input(s): BNP, PROBNP in the last 168 hours.  DDimer No results for input(s): DDIMER in the last 168 hours.  Radiology/Studies:  CT HEAD WO CONTRAST Result Date: 09/08/2024 EXAM: CT HEAD WITHOUT CONTRAST 09/08/2024 12:17:08 PM TECHNIQUE: CT of the head was performed without the administration of intravenous contrast. Automated exposure control, iterative reconstruction, and/or weight based adjustment of the mA/kV was utilized to reduce the radiation dose to as low as reasonably achievable. COMPARISON: CT head 04/27/2015 CLINICAL HISTORY: Mental status change, unknown cause FINDINGS: BRAIN AND VENTRICLES: No acute hemorrhage. Equivocal subtle diffuse loss of gray-white differentiation without mass effect. No hydrocephalus. No extra-axial collection. No mass effect or midline shift. ORBITS: No acute abnormality. SINUSES: No acute abnormality. SOFT TISSUES AND SKULL: No skull fracture. IMPRESSION: 1. Equivocal subtle diffuse loss of gray-white differentiation without mass effect. An MRI could provide more sensitive evaluation for hypoxic/ischemic insult if clinically warranted. Electronically signed by: Gilmore Molt MD 09/08/2024 12:35 PM EST RP Workstation: [id number]   DG Chest Port 1 View Result Date:  09/08/2024 EXAM: 1 VIEW(S) XRAY OF THE CHEST 09/08/2024 10:53:00 AM COMPARISON: 07/21/2022. CLINICAL HISTORY: Cardiac arrest, OG tube placement, patient found down FINDINGS: LINES, TUBES AND DEVICES: Endotracheal tube in place with tip about 7 cm above the carina. Enteric tube in place terminating in the stomach, side hole at the level of the gastric body. Pacer or resuscitation pads over the chest. LUNGS AND PLEURA: Low lung volumes. No focal pulmonary opacity. No pleural effusion. No pneumothorax. HEART AND MEDIASTINUM: No acute abnormality of the cardiac and mediastinal silhouettes. BONES AND SOFT TISSUES: Sternotomy wires and CABG noted. Chronic left lateral rib fractures. Paucity of bowel gas. IMPRESSION: 1. Support devices in  appropriate position. 2. Low lung volumes. 3. No acute cardiopulmonary findings. Electronically signed by: Donnice Mania MD 09/08/2024 11:45 AM EST RP Workstation: [id number]     Assessment and Plan:  Cardiac Arrest  - Patient was found down at a gas station, estimated to have been down for about 33 minutes.  ROSC achieved after 18 minutes CPR, 1 epi, 50 mcg fentanyl , 500 ml [place]. Arrived intubated. Given Narcan without improvement. Alcohol 158 mg/dL on arrival. Lactic acid 10.5  - Neurology has been consulted. Patient has continued myoclonic jerking. Pending EEG and has been loaded with Keppra   - EKG without STEMI. Initial hsTn 49 - No indication for urgent cath.  - Echo pending   Multivessel CAD s/p CABG  - prior MI in 2012 with multivessel stenting. R/L heart cath in 06/2022 that showed advanced multivessel coronary artery disease with severe distal left main disease, very severe ostial circumflex stenosis, subtotal occlusion of the mid circumflex with occlusion of the OM branched. There was also severe in-stent restenosis in the mid LAD, diffuse distal LAD disease, severe mid RCA stenosis  - Underwent CABGx4 2023 LIMA-LAD, RSVG from aorta to RPDA (acute marginal  extension), RSVG from aorta to Ramus sequenced to the distal OM.  - As above, no indication for urgent cath  - Echo pending   History of HFrEF  - EF Previously as low as 29% in 06/2022 prior to CABG. Additionally,  patient severe ischemic MR prior to CABG.  Most recent echo from 09/2022 showed EF improved to 40-45%, moderately reduced RV function, normal mitral valve  - Patient appears euvolemic on exam. Home GDMT held with low BP   PVCs  - Patient with PVCs on tele. Bedside echo with severely reduced EF  - Start IV amiodarone    Otherwise per primary  - ETOH use  - Liver disease  - Type 2 DM    Risk Assessment/Risk Scores:    For questions or updates, please contact [facility] Please consult [email or web address] for contact info under   Signed, Emeline FORBES Calender, MD  09/08/2024 3:19 PM  Patient seen and examined, note reviewed with the signed Advanced Practice Provider. I personally reviewed laboratory data, imaging studies and relevant notes. I independently examined the patient and formulated the important aspects of the plan. I have personally discussed the plan with the patient and/or family. Comments or changes to the note/plan are indicated below.  HPI: This is a 58 year old male with a history of multivessel disease s/p CABG x 4 (LIMA-LAD, RSVG from aorta to RPDA (acute marginal extension), RSVG from aorta to ramus sequen ced to the distal OM), ischemic mitral regurgitation, HFmrEF who was found down at a gas station bathroom for about 30 minutes before receiving 18 minutes of CPR. He was intubated by EMS.   My Exam:  Physical Exam Vitals and nursing note reviewed.  Constitutional:      Interventions: He is sedated and intubated.  HENT:     Head: Normocephalic and atraumatic.  Eyes:     Comments: Pinpoint pupils  Neck:     Vascular: No carotid bruit.  Cardiovascular:     Rate and Rhythm: Normal rate and regular rhythm.  Pulmonary:     Effort: Pulmonary effort is  normal. He is intubated.     Breath sounds: Normal breath sounds.  Musculoskeletal:        General: No swelling or tenderness.  Skin:    Coloration: Skin is not jaundiced or pale.  Neurological:     Comments: Intermittent myoclonic jerking       Telemetry: Sinus rhythm with PVCs and an episode of nonsustained VT- Personally reviewed EKG: Sinus rhythm, right bundle branch block, PVCs and nonspecific ST changes- Personally reviewed Bedside echocardiogram: Severely reduced biventricular function with an akinetic septum and dilated IVC on ventilator- Personally reviewed   Assessment & Plan:  Cardiac arrest, unknown etiology-unlikely ACS due to minimally elevated troponin and no pathologic ST changes on EKG.  Bedside echocardiogram formed by myself shows severely reduced biventricular function with concern for akinetic septum however this might be due to pulm hypertension.  Formal echocardiogram pending.  Patient will eventually need cardiac catheterization, pending prognosis Frequent PVCs-QT is prolonged however there is a lot of baseline EKG wondering and unclear if this is accurate.  Will repeat an EKG and start amiodarone  if normal QT.  If abnormal then recommend holding QT prolonging agents Acute respiratory failure postcardiac arrest Acute encephalopathy s/p cardiac arrest Alcohol and tobacco abuse CAD s/p CABG x 4  (LIMA-LAD, RSVG from aorta to RPDA (acute marginal extension), RSVG from aorta to ramus sequen ced to the distal OM) Hypertension Hyperlipidemia Type 2 diabetes OSA Severely reduced biventricular function by bedside echo  Time coordinating patient care: 75 minutes  Signed, Emeline Calender, DO [facility]  09/08/2024 3:19 PM

## 2024-09-08 NOTE — Plan of Care (Signed)
 Qtc is prolonged at 557 ms with electrolyte abnormalities, which are currently being corrected.   Consider repeat ECG after electrolyte repletion and start amiodarone  at that time if QT has normalized.   Thank you, Emeline Calender, DO

## 2024-09-08 NOTE — IPAL (Signed)
  Interdisciplinary Goals of Care Family Meeting   Date carried out: 09/08/2024  Location of the meeting: Phone conference  Member's involved: Family Member or next of kin and Other: PA-C  Durable Power of Insurance risk surveyor: cousin Bernarda Palau  Discussion: We discussed goals of care for [employer] .  Discussed w/ Berwyn long time girlfriend and cousin Bernarda who lives out of town in [place] . No other close relatives alive. Updated on grim prognosis and concern for anoxic brain injury. Both Bernarda and Berwyn agreed to be DNR moving fwd. Will update if anything changes.   Code status:   Code Status: Limited: Do not attempt resuscitation (DNR) -DNR-LIMITED -Do Not Intubate/DNI    Disposition: Continue current acute care  Time spent for the meeting: 35 minutes    Norleen JONETTA Cedar, PA-C  09/08/2024, 3:33 PM

## 2024-09-08 NOTE — Procedures (Signed)
 Central Venous Catheter Insertion Procedure Note  Jacek Dhiren Azimi  [id number]  [birth year]  Date:09/08/24  Time:2:25 PM   Provider Performing:Nagi Furio D Emilio   Procedure: Insertion of Non-tunneled Central Venous Catheter(36556) with US  guidance (23062)   Indication(s) Medication administration  Consent Unable to obtain consent due to emergent nature of procedure.  Anesthesia Topical only with 1% lidocaine    Timeout Verified patient identification, verified procedure, site/side was marked, verified correct patient position, special equipment/implants available, medications/allergies/relevant history reviewed, required imaging and test results available.  Sterile Technique Maximal sterile technique including full sterile barrier drape, hand hygiene, sterile gown, sterile gloves, mask, hair covering, sterile ultrasound probe cover (if used).  Procedure Description Area of catheter insertion was cleaned with chlorhexidine  and draped in sterile fashion.  With real-time ultrasound guidance a central venous catheter was placed into the left internal jugular vein. Nonpulsatile blood flow and easy flushing noted in all ports.  The catheter was sutured in place and sterile dressing applied.  Complications/Tolerance None; patient tolerated the procedure well. Chest X-ray is ordered to verify placement for internal jugular or subclavian cannulation.   Chest x-ray is not ordered for femoral cannulation.  EBL Minimal  Specimen(s) None  JD Emilio RIGGERS [facility] 09/08/2024, 2:26 PM  Please see [email or web address] for pager details.  From 7A-7P if no response, please call [phone]. After hours, please call ELink [phone].

## 2024-09-08 NOTE — TOC CM/SW Note (Signed)
 Transition of Care [facility]) - Inpatient Brief Assessment   Patient Details  Name: [name] MRN: [id number] Date of Birth: 02-May-1966  Transition of Care [facility]) CM/SW Contact:    Lauraine FORBES Saa, LCSWA Phone Number: 09/08/2024, 1:57 PM   Clinical Narrative:  1:58 PM Per chart review, patient has a PCP and insurance. Patient does not have SNF or DME history. Patient has HH history with the [facility]. Patient's preferred pharmacy is [facility] [address]. Patient is currently intubated with feeding tube and unable to answer SDOH questions. TOC consult was placed for assistance finding family. CSW contacted patient's significant other, Berwyn Budge [phone]) who confirmed phone number. Medical team made aware. Bedside RN informed medical team that patient's family is at bedside. No other TOC needs identified at this time. TOC will continue to follow.  Transition of Care Asessment: Insurance and Status: Insurance coverage has been reviewed Patient has primary care physician: Yes Home environment has been reviewed: Private Residence Prior level of function:: N/A Prior/Current Home Services: No current home services Social Drivers of Health Review: SDOH reviewed no interventions necessary (As of January 15th, 2024. Patient is currently intubated.) Readmission risk has been reviewed: Yes (Currently Yellow 17%) Transition of care needs: no transition of care needs at this time

## 2024-09-08 NOTE — TOC CM/SW Note (Signed)
 Received call from [place] with the [facility] [phone]). She was responding to vm left by prior CM. Notification number is [phone]. CM informed [facility] rep that patient has been admitted to the ICU due to cardiac arrest.   Merilee Batty, MSN, RN Case Management [phone]

## 2024-09-08 NOTE — Consult Note (Signed)
 NEUROLOGY CONSULT NOTE   Date of service: September 08, 2024 Patient Name: [name] MRN:  [id number] DOB:  04/24/1966 Chief Complaint: concern for seizures vs myoclonus Requesting Provider: Olena Lamar PARAS, MD  History of Present Illness  [name] is a 58 y.o. male with hx of CAD, DM2, MI, sleep apnea, tobacco use who is admitted to the ICU after he presented to the ED with cardiac arrest. He is comatose and intubated and sedated and unable to provide any hx. Was found down unconscious in a gas station bathroom for approximately . ROSC in 18 mins. He was intubated in the field and brought in to the ED.  In the ICU, noted to have intermittent arhythmic jerking of the full body and face concerning for myoclonus. Patient is on propofol  at 50, on Keppra at 4500 and continues to have intermittent full body and face myoclonus.  Neurology consulted for further evaluation and workup.   ROS  Unable to ascertain due to comatose.  Past History   Past Medical History:  Diagnosis Date   Acid reflux    Concussion    Coronary artery disease    Diabetes (HCC)    ETOH abuse    High cholesterol    Hypertension    MI (myocardial infarction) (HCC)    2012   Sleep apnea    USES CPAP AS NEEDED   Tobacco abuse     Past Surgical History:  Procedure Laterality Date   BIOPSY  07/01/2022   Procedure: BIOPSY;  Surgeon: San Sandor GAILS, DO;  Location: [facility];  Service: Gastroenterology;;   COLONOSCOPY N/A 07/01/2022   Procedure: COLONOSCOPY;  Surgeon: San Sandor GAILS, DO;  Location: [facility];  Service: Gastroenterology;  Laterality: N/A;   CORONARY ARTERY BYPASS GRAFT N/A 07/04/2022   Procedure: CORONARY ARTERY BYPASS GRAFTING (CABG) X4, USING LEFT GREATER SAPHENOUS VEIN.;  Surgeon: Maryjane Mt, MD;  Location: [facility] OR;  Service: Open Heart Surgery;  Laterality: N/A;   ESOPHAGOGASTRODUODENOSCOPY (EGD) WITH PROPOFOL  N/A 07/01/2022   Procedure:  ESOPHAGOGASTRODUODENOSCOPY (EGD) WITH PROPOFOL ;  Surgeon: San Sandor GAILS, DO;  Location: [facility];  Service: Gastroenterology;  Laterality: N/A;   HEMOSTASIS CLIP PLACEMENT  07/01/2022   Procedure: HEMOSTASIS CLIP PLACEMENT;  Surgeon: San Sandor GAILS, DO;  Location: [facility];  Service: Gastroenterology;;   KNEE ARTHROSCOPY     ORIF ULNAR FRACTURE Right 12/31/2016   Procedure: OPEN REDUCTION INTERNAL FIXATION (ORIF) both bone forearm fracture;  Surgeon: Elsie Mussel, MD;  Location: [facility] OR;  Service: Orthopedics;  Laterality: Right;   POLYPECTOMY  07/01/2022   Procedure: POLYPECTOMY;  Surgeon: San Sandor GAILS, DO;  Location: [facility];  Service: Gastroenterology;;   RIGHT/LEFT HEART CATH AND CORONARY ANGIOGRAPHY N/A 06/25/2022   Procedure: RIGHT/LEFT HEART CATH AND CORONARY ANGIOGRAPHY;  Surgeon: Wonda Sharper, MD;  Location: [facility] INVASIVE CV LAB;  Service: Cardiovascular;  Laterality: N/A;   stents     TEE WITHOUT CARDIOVERSION N/A 06/25/2022   Procedure: TRANSESOPHAGEAL ECHOCARDIOGRAM (TEE);  Surgeon: Mona Vinie BROCKS, MD;  Location: [facility] ENDOSCOPY;  Service: Cardiovascular;  Laterality: N/A;   TEE WITHOUT CARDIOVERSION N/A 07/04/2022   Procedure: TRANSESOPHAGEAL ECHOCARDIOGRAM (TEE);  Surgeon: Maryjane Mt, MD;  Location: [facility] OR;  Service: Open Heart Surgery;  Laterality: N/A;    Family History: Family History  Problem Relation Age of Onset   Diabetes Mother     Social History  reports that he has been smoking cigarettes. He has a 7.5 pack-year smoking history. He has never  used smokeless tobacco. He reports current alcohol use of about 6.0 standard drinks of alcohol per week. He reports that he does not use drugs.  No Known Allergies  Medications   Current Facility-Administered Medications:    0.9 %  sodium chloride  infusion, 250 mL, Intravenous, Continuous, Garrick Charleston, MD   Place/Maintain arterial line, , , Until Discontinued **AND** 0.9 %  sodium chloride   infusion, , Intra-arterial, PRN, Garrick Charleston, MD   0.9 %  sodium chloride  infusion, 250 mL, Intravenous, Continuous, Emilio Norleen BIRCH, PA-C, Last Rate: 10 mL/hr at 09/08/24 1843, Infusion Verify at 09/08/24 1843   acetaminophen  (TYLENOL ) tablet 650 mg, 650 mg, Per Tube, Q4H PRN, Payne, John D, PA-C   cefTRIAXone  (ROCEPHIN ) 2 g in sodium chloride  0.9 % 100 mL IVPB, 2 g, Intravenous, Q24H, Payne, John D, PA-C, Paused at 09/08/24 1333   Chlorhexidine  Gluconate Cloth 2 % PADS 6 each, 6 each, Topical, Daily, Garrick Charleston, MD, 6 each at 09/08/24 1247   docusate (COLACE) 50 MG/5ML liquid 100 mg, 100 mg, Per Tube, BID PRN, Stretch, Charleston PARAS, MD   docusate (COLACE) 50 MG/5ML liquid 100 mg, 100 mg, Per Tube, BID, Emilio, John D, PA-C   famotidine  (PEPCID ) tablet 20 mg, 20 mg, Per Tube, BID, Payne, John D, PA-C, 20 mg at 09/08/24 1657   fentaNYL  (SUBLIMAZE ) bolus via infusion 25-100 mcg, 25-100 mcg, Intravenous, Q15 min PRN, Garrick Charleston, MD, 50 mcg at 09/08/24 1108   fentaNYL  (SUBLIMAZE ) injection 25-50 mcg, 25-50 mcg, Intravenous, Once, Garrick Charleston, MD   fentaNYL  in NS (68mcg/ml) infusion-PREMIX, 0-400 mcg/hr, Intravenous, Continuous, Garrick Charleston, MD, Stopped at 09/08/24 1342   folic acid  (FOLVITE ) tablet 1 mg, 1 mg, Per Tube, Daily, Payne, John D, PA-C, 1 mg at 09/08/24 1657   heparin  ADULT infusion 100 units/mL (25000 units/250mL), 1,000 Units/hr, Intravenous, Continuous, Stretch, Charleston PARAS, MD, Last Rate: 10 mL/hr at 09/08/24 1843, 1,000 Units/hr at 09/08/24 1843   insulin  aspart (novoLOG ) injection 0-9 Units, 0-9 Units, Subcutaneous, Q4H, Payne, John D, PA-C   LORazepam  (ATIVAN ) injection 4 mg, 4 mg, Intravenous, Once, Rayah Fines, MD   multivitamin with minerals tablet 1 tablet, 1 tablet, Per Tube, Daily, Payne, John D, PA-C, 1 tablet at 09/08/24 1657   nicotine  (NICODERM CQ  - dosed in mg/24 hr) patch 7 mg, 7 mg, Transdermal, Daily, Emilio, John D, PA-C, 7 mg at  09/08/24 1658   norepinephrine  (LEVOPHED ) 4mg  in (0.016 mg/mL) premix infusion, 0-40 mcg/min, Intravenous, Titrated, Emilio Norleen BIRCH, PA-C, Last Rate: 7.5 mL/hr at 09/08/24 1843, 2 mcg/min at 09/08/24 1843   polyethylene glycol (MIRALAX  / GLYCOLAX ) packet 17 g, 17 g, Per Tube, Daily PRN, Payne, John D, PA-C   polyethylene glycol (MIRALAX  / GLYCOLAX ) packet 17 g, 17 g, Per Tube, Daily, Emilio, John D, PA-C   propofol  (DIPRIVAN ) 1000 MG/100ML infusion, 0-80 mcg/kg/min, Intravenous, Titrated, Emilio Norleen BIRCH, PA-C, Last Rate: 20.1 mL/hr at 09/08/24 1843, 50 mcg/kg/min at 09/08/24 1843   thiamine  (VITAMIN B1) injection 100 mg, 100 mg, Intravenous, Daily, Payne, John D, PA-C, 100 mg at 09/08/24 1658   valproate (DEPACON ) 1,500 mg in dextrose  5 % 50 mL IVPB, 1,500 mg, Intravenous, Once, Vanessa Robert, MD  Vitals   Vitals:   09/08/24 1845 09/08/24 1900 09/08/24 1915 09/08/24 1924  BP:  115/75    Pulse: 77 77 79 80  Resp: 20 20 19  (!) 21  Temp: 99.5 F (37.5 C) 99.9 F (37.7 C) 100 F (37.8 C)  TempSrc:      SpO2: 100% 100% 100%   Weight:      Height:        Body mass index is 20.6 kg/m.   Physical Exam on propofol  at 50   General: Laying comfortably in bed; intermittent myoclonic jerks. HENT: Normal oropharynx and mucosa. Normal external appearance of ears and nose.  Neck: Supple, no pain or tenderness  CV: No JVD. No peripheral edema.  Pulmonary: Symmetric Chest rise. Not breathing over vent. Abdomen: Soft to touch, non-tender.  Ext: No cyanosis, edema, or deformity  Skin: No rash. Normal palpation of skin.   Musculoskeletal: Normal digits and nails by inspection. No clubbing.   Neurologic Examination  Mental status/Cognition: comatose, no response to voice, loud clap or noxious stimuli Speech/language: mute, no speech, does not follow commands. Cranial nerves:   CN II Pupils are 2mm and round BL and unreactive to light   CN III,IV,VI Dolls eyes reflex is absent.   CN V  Corneals are absent BL   CN VII Facial diplegia   CN VIII Does not turn head towards speech   CN IX & X No gag, attempted but unable to suction trachea with the ETT to elicit a gag.   CN XI Head is midline   CN XII Does not protrude tongue on command.   Sensory/Motor:  Muscle bulk: poor, tone flaccid except during myoclonic jerks. No purposeful movements noted to proximal pinch in any of the extremities. Noted to have increased myoclonic jerks to stimulation.  Coordination/Complex Motor:  - Unable to assess.  Labs/Imaging/Neurodiagnostic studies   CBC:  Recent Labs  Lab 2024/09/11 1044 2024-09-11 1049  WBC  --  1.9*  HGB 10.9* 10.9*  HCT 32.0* 32.5*  MCV  --  100.9*  PLT  --  147*   Basic Metabolic Panel:  Lab Results  Component Value Date   NA 133 (L) 11-Sep-2024   K 3.6 09/11/2024   CO2 15 (L) Sep 11, 2024   GLUCOSE 144 (H) September 11, 2024   BUN 9 September 11, 2024   CREATININE 1.14 2024-09-11   CALCIUM  8.4 (L) 09/11/24   GFRNONAA >60 09/11/24   GFRAA >60 04/07/2020   Lipid Panel:  Lab Results  Component Value Date   LDLCALC 147 (H) 10/07/2022   HgbA1c:  Lab Results  Component Value Date   HGBA1C 7.8 (H) Sep 11, 2024   Urine Drug Screen:     Component Value Date/Time   LABOPIA NONE DETECTED 2024/09/11 1235   COCAINSCRNUR NONE DETECTED September 11, 2024 1235   LABBENZ NONE DETECTED 2024-09-11 1235   AMPHETMU NONE DETECTED 11-Sep-2024 1235   THCU NONE DETECTED Sep 11, 2024 1235   LABBARB NONE DETECTED 11-Sep-2024 1235    Alcohol Level     Component Value Date/Time   ETH 158 (H) 09/11/2024 1051   INR  Lab Results  Component Value Date   INR 1.1 11-Sep-2024   APTT  Lab Results  Component Value Date   APTT 27 2024/09/11   AED levels: No results found for: PHENYTOIN, ZONISAMIDE, LAMOTRIGINE, LEVETIRACETA  CT Head without contrast(Personally reviewed): Looks normal to me on my review but per radiology read, equivocal subtle diffuse loss of gray-white  differentiation without mass effect.  MRI Brain(Personally reviewed): pending  Neurodiagnostics cEEG:  pending  ASSESSMENT   Jerid Weston Kallman is a 58 y.o. male with hx of CAD, DM2, MI, sleep apnea, tobacco use who is admitted to the ICU after he presented to the ED with cardiac arrest with ROSC in 18 mins  and noted to have intermittent arrhythmic myoclonic jerks. He was loaded with Keppra 60mg /Kg and neurology consulted.  RECOMMENDATIONS  - Ativan  4mg  - Load with Valproic acid 1500mg  Iv once - start Keppra 1000mg  BID. - increase propofol  to 80. - MRI brain between day 3-5 - avoid hypotension, hyperthermia and hyponatremia. - cEEG in AM. Will titrate prop and add AEDs to clinical control of myoclonic jerks tonight. - we will continue to follow along. ______________________________________________________________________  This patient is critically ill and at significant risk of neurological worsening, death and care requires constant monitoring of vital signs, hemodynamics,respiratory and cardiac monitoring, neurological assessment, discussion with family, other specialists and medical decision making of high complexity. I spent 50 minutes of neurocritical care time  in the care of  this patient. This was time spent independent of any time provided by nurse practitioner or PA.  Lauria Depoy [facility] 09/08/2024  8:14 PM  Plan discussed with Dr. Haze with the [facility] team on phone.  Signed, Cosimo Schertzer, MD [facility]

## 2024-09-08 NOTE — Progress Notes (Signed)
 Pt transported from ED trauma B to CT and then 58M 12 without complication.

## 2024-09-08 NOTE — Progress Notes (Signed)
 Patient arrived by EMS intubated with 7.0 ETT at 26 at the lips.  Patient was placed on our ventilator with bilateral breath sounds auscultated.  Patient receiving appropriate volumes on ventilator.  Tolerating current settings well at this time.  Will continue to monitor.

## 2024-09-08 NOTE — Progress Notes (Addendum)
 eLink Physician-Brief Progress Note Patient Name: [name] DOB: February 08, 1966 MRN: [id number]   Date of Service  09/08/2024  HPI/Events of Note  Intermittent wide-complex tachycardia, QT prolongation to 550s.  Repeat EKG post electrolyte repletion still abnormal.  eICU Interventions  Repeat BMP now Will continue to replete electrolytes and reassess EKG   2226 -K2.7, remaining electrolytes appropriate.  KCl repletion.  Intervention Category Intermediate Interventions: Arrhythmia - evaluation and management  Ethelda Deangelo 09/08/2024, 8:58 PM

## 2024-09-08 NOTE — Telephone Encounter (Signed)
 error

## 2024-09-08 NOTE — Progress Notes (Signed)
 PHARMACY - ANTICOAGULATION CONSULT NOTE  Pharmacy Consult for Heparin   Indication: Apical thrombus  No Known Allergies  Patient Measurements: Height: 5' 11 (180.3 cm) Weight: 67 kg (147 lb 11.3 oz) IBW/kg (Calculated) : 75.3  Vital Signs: Temp: 94.8 F (34.9 C) (11/20 1515) Temp Source: Bladder (11/20 1300) BP: 94/69 (11/20 1500) Pulse Rate: 61 (11/20 1515)  Labs: Recent Labs    09/08/24 1044 09/08/24 1049 09/08/24 1343  HGB 10.9* 10.9*  --   HCT 32.0* 32.5*  --   PLT  --  147*  --   APTT  --  27  --   LABPROT  --  14.7  --   INR  --  1.1  --   CREATININE  --  1.14  --   TROPONINIHS  --  49* 125*    Estimated Creatinine Clearance: 67.8 mL/min (by C-G formula based on SCr of 1.14 mg/dL).   Medical History: Past Medical History:  Diagnosis Date   Acid reflux    Concussion    Coronary artery disease    Diabetes (HCC)    ETOH abuse    High cholesterol    Hypertension    MI (myocardial infarction) (HCC)    2012   Sleep apnea    USES CPAP AS NEEDED   Tobacco abuse     Assessment: 58 year old male to begin heparin  for an apical thrombus  Goal of Therapy:  Heparin  level 0.3-0.7 units/ml Monitor platelets by anticoagulation protocol: Yes   Plan:  Heparin  bolus 3000 units iv x 1 Heparin  drip at 1000 units / hr Heparin  level in 8 hours Daily heparin  level, CBC  Thank you. Olam Monte, PharmD

## 2024-09-08 NOTE — Progress Notes (Signed)
  Echocardiogram 2D Echocardiogram has been performed.  [name] 09/08/2024, 3:14 PM

## 2024-09-08 NOTE — Plan of Care (Signed)
  Problem: Clinical Measurements: Goal: Ability to maintain clinical measurements within normal limits will improve Outcome: Not Progressing Goal: Will remain free from infection Outcome: Not Progressing Goal: Diagnostic test results will improve Outcome: Not Progressing Goal: Respiratory complications will improve Outcome: Not Progressing Goal: Cardiovascular complication will be avoided Outcome: Not Progressing   Problem: Activity: Goal: Risk for activity intolerance will decrease Outcome: Not Progressing

## 2024-09-08 NOTE — Progress Notes (Signed)
 VASCULAR LAB    Bilateral lower extremity venous duplex has been performed.  See CV proc for preliminary results.   Latronda Spink, RVT 09/08/2024, 4:26 PM

## 2024-09-08 NOTE — Procedures (Signed)
 RT Art Line Insertion  Date/Time: 09/08/2024 11:22 AM  Performed by: Con Leotis NOVAK, RRT Authorized by: Garrick Charleston, MD  Consent: The procedure was performed in an emergent situation Patient identity confirmed: arm band Preparation: Patient was prepped and draped in the usual sterile fashion. Local anesthesia used: no  Anesthesia: Local anesthesia used: no

## 2024-09-08 NOTE — Care Management (Signed)
 Transition of Care [facility]) - Inpatient Brief Assessment   Patient Details  Name: [name] MRN: [id number] Date of Birth: 10/06/1966  Transition of Care [facility]) CM/SW Contact:    Corean JAYSON Canary, RN Phone Number: 09/08/2024, 12:04 PM   Clinical Narrative:  Brief note- High readmit risk  Patient was [place] Has a history of ETOH and liver disease, DM. Followed by Bonni LIEN Last visit 2 days ago on 11/18 CPR was performed, the patient is intubated and has a aline.  [facility].   IPCM will follow  Transition of Care Asessment: Insurance and Status: Insurance coverage has been reviewed Patient has primary care physician: Yes   Prior level of function:: Independent Prior/Current Home Services: No current home services Social Drivers of Health Review: SDOH reviewed no interventions necessary   Transition of care needs: transition of care needs identified, TOC will continue to follow

## 2024-09-08 NOTE — Progress Notes (Signed)
 2D stat echo attempted, sterile procedure in room. Will try asap

## 2024-09-08 NOTE — Progress Notes (Addendum)
 Per EMS Pt.was found at [facility] K. Pt. came in as CPR. Chaplain provided support to pt and staff as needed.    Rayleen Dade, [place], [facility], Pager [phone]

## 2024-09-09 ENCOUNTER — Inpatient Hospital Stay (HOSPITAL_COMMUNITY)

## 2024-09-09 DIAGNOSIS — G931 Anoxic brain damage, not elsewhere classified: Secondary | ICD-10-CM | POA: Diagnosis not present

## 2024-09-09 DIAGNOSIS — I469 Cardiac arrest, cause unspecified: Secondary | ICD-10-CM | POA: Diagnosis not present

## 2024-09-09 LAB — GLUCOSE, CAPILLARY
Glucose-Capillary: 146 mg/dL — ABNORMAL HIGH (ref 70–99)
Glucose-Capillary: 154 mg/dL — ABNORMAL HIGH (ref 70–99)
Glucose-Capillary: 133 mg/dL — ABNORMAL HIGH (ref 70–99)
Glucose-Capillary: 124 mg/dL — ABNORMAL HIGH (ref 70–99)
Glucose-Capillary: 135 mg/dL — ABNORMAL HIGH (ref 70–99)
Glucose-Capillary: 158 mg/dL — ABNORMAL HIGH (ref 70–99)

## 2024-09-09 LAB — COMPREHENSIVE METABOLIC PANEL WITH GFR
ALT: 57 U/L — ABNORMAL HIGH (ref 0–44)
AST: 115 U/L — ABNORMAL HIGH (ref 15–41)
Albumin: 2.9 g/dL — ABNORMAL LOW (ref 3.5–5.0)
Alkaline Phosphatase: 54 U/L (ref 38–126)
Anion gap: 19 — ABNORMAL HIGH (ref 5–15)
BUN: 13 mg/dL (ref 6–20)
CO2: 16 mmol/L — ABNORMAL LOW (ref 22–32)
Calcium: 8.6 mg/dL — ABNORMAL LOW (ref 8.9–10.3)
Chloride: 97 mmol/L — ABNORMAL LOW (ref 98–111)
Creatinine, Ser: 1.53 mg/dL — ABNORMAL HIGH (ref 0.61–1.24)
GFR, Estimated: 53 mL/min — ABNORMAL LOW (ref >60)
Glucose, Bld: 151 mg/dL — ABNORMAL HIGH (ref 70–99)
Potassium: 3.2 mmol/L — ABNORMAL LOW (ref 3.5–5.1)
Sodium: 132 mmol/L — ABNORMAL LOW (ref 135–145)
Total Bilirubin: 1.4 mg/dL — ABNORMAL HIGH (ref 0.0–1.2)
Total Protein: 6.4 g/dL — ABNORMAL LOW (ref 6.5–8.1)

## 2024-09-09 LAB — BASIC METABOLIC PANEL WITH GFR
Anion gap: 16 — ABNORMAL HIGH (ref 5–15)
BUN: 10 mg/dL (ref 6–20)
CO2: 17 mmol/L — ABNORMAL LOW (ref 22–32)
Calcium: 8.6 mg/dL — ABNORMAL LOW (ref 8.9–10.3)
Chloride: 102 mmol/L (ref 98–111)
Creatinine, Ser: 1.32 mg/dL — ABNORMAL HIGH (ref 0.61–1.24)
GFR, Estimated: >60 mL/min (ref >60)
Glucose, Bld: 126 mg/dL — ABNORMAL HIGH (ref 70–99)
Potassium: 3.9 mmol/L (ref 3.5–5.1)
Sodium: 135 mmol/L (ref 135–145)

## 2024-09-09 LAB — COOXEMETRY PANEL
Carboxyhemoglobin: 1.7 % — ABNORMAL HIGH (ref 0.5–1.5)
Methemoglobin: <0.7 % (ref 0.0–1.5)
O2 Saturation: 100 %
Total hemoglobin: 10.8 g/dL — ABNORMAL LOW (ref 12.0–16.0)

## 2024-09-09 LAB — BRAIN NATRIURETIC PEPTIDE: B Natriuretic Peptide: 261.1 pg/mL — ABNORMAL HIGH (ref 0.0–100.0)

## 2024-09-09 LAB — CBC
HCT: 28.1 % — ABNORMAL LOW (ref 39.0–52.0)
Hemoglobin: 10.3 g/dL — ABNORMAL LOW (ref 13.0–17.0)
MCH: 34.6 pg — ABNORMAL HIGH (ref 26.0–34.0)
MCHC: 36.7 g/dL — ABNORMAL HIGH (ref 30.0–36.0)
MCV: 94.3 fL (ref 80.0–100.0)
Platelets: 161 K/uL (ref 150–400)
RBC: 2.98 MIL/uL — ABNORMAL LOW (ref 4.22–5.81)
RDW: 12.3 % (ref 11.5–15.5)
WBC: 10.5 K/uL (ref 4.0–10.5)
nRBC: 0 % (ref 0.0–0.2)

## 2024-09-09 LAB — TRIGLYCERIDES: Triglycerides: 83 mg/dL (ref <150)

## 2024-09-09 LAB — MAGNESIUM: Magnesium: 2.1 mg/dL (ref 1.7–2.4)

## 2024-09-09 LAB — HEPARIN LEVEL (UNFRACTIONATED)
Heparin Unfractionated: 0.47 [IU]/mL (ref 0.30–0.70)
Heparin Unfractionated: 0.51 [IU]/mL (ref 0.30–0.70)

## 2024-09-09 MED ORDER — ORAL CARE MOUTH RINSE
15.0000 mL | OROMUCOSAL | Status: DC
  Administered 2024-09-10 – 2024-09-16 (×78): 15 mL via OROMUCOSAL

## 2024-09-09 MED ORDER — ORAL CARE MOUTH RINSE: 15.0000 mL | OROMUCOSAL | Status: DC | PRN

## 2024-09-09 MED ORDER — SODIUM BICARBONATE 8.4 % IV SOLN
Freq: Once | INTRAVENOUS | Status: AC
  Filled 2024-09-09: qty 1000

## 2024-09-09 NOTE — Progress Notes (Signed)
 LTM EEG hooked up and running - no initial skin breakdown - push button tested - [facility] monitoring.CT leads used

## 2024-09-09 NOTE — Plan of Care (Signed)
  Problem: Education: Goal: Ability to manage disease process will improve Outcome: Progressing   Problem: Cardiac: Goal: Ability to achieve and maintain adequate cardiopulmonary perfusion will improve Outcome: Progressing   Problem: Neurologic: Goal: Promote progressive neurologic recovery Outcome: Progressing   Problem: Skin Integrity: Goal: Risk for impaired skin integrity will be minimized. Outcome: Progressing   Problem: Education: Goal: Knowledge of General Education information will improve Description: Including pain rating scale, medication(s)/side effects and non-pharmacologic comfort measures Outcome: Progressing   Problem: Health Behavior/Discharge Planning: Goal: Ability to manage health-related needs will improve Outcome: Progressing   Problem: Clinical Measurements: Goal: Ability to maintain clinical measurements within normal limits will improve Outcome: Progressing Goal: Will remain free from infection Outcome: Progressing Goal: Diagnostic test results will improve Outcome: Progressing Goal: Respiratory complications will improve Outcome: Progressing Goal: Cardiovascular complication will be avoided Outcome: Progressing   Problem: Activity: Goal: Risk for activity intolerance will decrease Outcome: Progressing   Problem: Nutrition: Goal: Adequate nutrition will be maintained Outcome: Progressing   Problem: Coping: Goal: Level of anxiety will decrease Outcome: Progressing   Problem: Elimination: Goal: Will not experience complications related to bowel motility Outcome: Progressing Goal: Will not experience complications related to urinary retention Outcome: Progressing   Problem: Pain Managment: Goal: General experience of comfort will improve and/or be controlled Outcome: Progressing   Problem: Pain Managment: Goal: General experience of comfort will improve and/or be controlled Outcome: Progressing   Problem: Safety: Goal: Ability to  remain free from injury will improve Outcome: Progressing   Problem: Skin Integrity: Goal: Risk for impaired skin integrity will decrease Outcome: Progressing

## 2024-09-09 NOTE — Progress Notes (Signed)
 PHARMACY - ANTICOAGULATION CONSULT NOTE  Pharmacy Consult for heparin  Indication: apical thrombus  Labs: Recent Labs    09/08/24 1044 09/08/24 1049 09/08/24 1343 09/08/24 1806 09/08/24 2105 09/09/24 0353  HGB 10.9* 10.9*  --   --   --   --   HCT 32.0* 32.5*  --   --   --   --   PLT  --  147*  --   --   --   --   APTT  --  27  --   --   --   --   LABPROT  --  14.7  --   --   --   --   INR  --  1.1  --   --   --   --   HEPARINUNFRC  --   --   --   --   --  0.47  CREATININE  --  1.14  --   --  1.19  --   TROPONINIHS  --  49* 125* 174*  --   --    Assessment/Plan:  58yo male therapeutic on heparin  with initial dosing for apical thrombus. Will continue infusion at current rate of 1000 units/hr and confirm stable with additional level.  Marvetta Dauphin, PharmD, BCPS 09/09/2024 4:26 AM

## 2024-09-09 NOTE — Progress Notes (Signed)
 NEUROLOGY CONSULT FOLLOW UP NOTE   Date of service: September 09, 2024 Patient Name: [name] MRN:  [id number] DOB:  [birth year]  Interval Hx/subjective  On propofol  @80mcg /kg/min Sedation held for exam.  Vitals   Vitals:   09/09/24 0800 09/09/24 0900 09/09/24 0909 09/09/24 1000  BP: 114/83     Pulse: 70 73 74 76  Resp: 20 20 20 20   Temp: 98.4 F (36.9 C)     TempSrc: Bladder     SpO2: 100% 100% 100% 100%  Weight:      Height:         Body mass index is 20.6 kg/m.  Physical Exam  Gen: Sedated, intubated HEENT: [place] AT [employer]: RRR Chest: vented NEUROLOGICAL EXAM Sedated, intubated Sedation held for exam No spontaneous movement noted No movement to nox stim Pupils pin point with extremely sluggish response. Corneals absent bilaterally Breathing with the vent for most part but does pull over once in a while per RN B/l foot rhythmic movement with no EEG correlate  Medications  Current Facility-Administered Medications:    Place/Maintain arterial line, , , Until Discontinued **AND** 0.9 %  sodium chloride  infusion, , Intra-arterial, PRN, Garrick Charleston, MD   0.9 %  sodium chloride  infusion, 250 mL, Intravenous, Continuous, Emilio, John D, PA-C, Last Rate: 10 mL/hr at 09/09/24 1000, Infusion Verify at 09/09/24 1000   acetaminophen  (TYLENOL ) tablet 650 mg, 650 mg, Per Tube, Q4H PRN, Payne, John D, PA-C   cefTRIAXone  (ROCEPHIN ) 2 g in sodium chloride  0.9 % 100 mL IVPB, 2 g, Intravenous, Q24H, Payne, John D, PA-C, Paused at 09/08/24 1333   Chlorhexidine  Gluconate Cloth 2 % PADS 6 each, 6 each, Topical, Daily, Garrick Charleston, MD, 6 each at 09/09/24 0857   docusate (COLACE) 50 MG/5ML liquid 100 mg, 100 mg, Per Tube, BID PRN, Stretch, Charleston PARAS, MD   docusate (COLACE) 50 MG/5ML liquid 100 mg, 100 mg, Per Tube, BID, Payne, John D, PA-C, 100 mg at 09/09/24 9140   famotidine  (PEPCID ) tablet 20 mg, 20 mg, Per Tube, BID, Payne, John D, PA-C, 20 mg at 09/09/24 9144   fentaNYL   (SUBLIMAZE ) bolus via infusion 25-100 mcg, 25-100 mcg, Intravenous, Q15 min PRN, Garrick Charleston, MD, 50 mcg at 09/08/24 1108   folic acid  (FOLVITE ) tablet 1 mg, 1 mg, Per Tube, Daily, Payne, John D, PA-C, 1 mg at 09/09/24 9144   heparin  ADULT infusion 100 units/mL (25000 units/250mL), 1,000 Units/hr, Intravenous, Continuous, Stretch, Charleston PARAS, MD, Last Rate: 10 mL/hr at 09/09/24 1000, 1,000 Units/hr at 09/09/24 1000   insulin  aspart (novoLOG ) injection 0-9 Units, 0-9 Units, Subcutaneous, Q4H, Payne, John D, PA-C, 2 Units at 09/09/24 9143   levETIRAcetam  (KEPPRA ) undiluted injection 1,000 mg, 1,000 mg, Intravenous, BID, Khaliqdina, Salman, MD, 1,000 mg at 09/09/24 0857   multivitamin with minerals tablet 1 tablet, 1 tablet, Per Tube, Daily, Emilio Norleen BIRCH, PA-C, 1 tablet at 09/09/24 0855   nicotine  (NICODERM CQ  - dosed in mg/24 hr) patch 7 mg, 7 mg, Transdermal, Daily, Payne, John D, PA-C, 7 mg at 09/09/24 9087   norepinephrine  (LEVOPHED ) 4mg  in (0.016 mg/mL) premix infusion, 0-40 mcg/min, Intravenous, Titrated, Emilio, John D, PA-C, Last Rate: 18.75 mL/hr at 09/09/24 1000, 5 mcg/min at 09/09/24 1000   polyethylene glycol (MIRALAX  / GLYCOLAX ) packet 17 g, 17 g, Per Tube, Daily PRN, Payne, John D, PA-C   polyethylene glycol (MIRALAX  / GLYCOLAX ) packet 17 g, 17 g, Per Tube, Daily, Payne, John D, PA-C   propofol  (DIPRIVAN )  1000 MG/100ML infusion, 80 mcg/kg/min, Intravenous, Titrated, Khaliqdina, Salman, MD, Last Rate: 32.2 mL/hr at 09/09/24 1000, 80 mcg/kg/min at 09/09/24 1000   thiamine  (VITAMIN B1) injection 100 mg, 100 mg, Intravenous, Daily, Emilio Norleen BIRCH, PA-C, 100 mg at 09/09/24 0857  Labs and Diagnostic Imaging   CBC:  Recent Labs  Lab 09/08/24 1049 09/09/24 0425  WBC 1.9* 10.5  HGB 10.9* 10.3*  HCT 32.5* 28.1*  MCV 100.9* 94.3  PLT 147* 161    Basic Metabolic Panel:  Lab Results  Component Value Date   NA 132 (L) 09/09/2024   K 3.2 (L) 09/09/2024   CO2 16 (L) 09/09/2024    GLUCOSE 151 (H) 09/09/2024   BUN 13 09/09/2024   CREATININE 1.53 (H) 09/09/2024   CALCIUM  8.6 (L) 09/09/2024   GFRNONAA 53 (L) 09/09/2024   GFRAA >60 04/07/2020   Lipid Panel:  Lab Results  Component Value Date   LDLCALC 147 (H) 10/07/2022   HgbA1c:  Lab Results  Component Value Date   HGBA1C 7.8 (H) 09/08/2024   Urine Drug Screen:     Component Value Date/Time   LABOPIA NONE DETECTED 09/08/2024 1235   COCAINSCRNUR NONE DETECTED 09/08/2024 1235   LABBENZ NONE DETECTED 09/08/2024 1235   AMPHETMU NONE DETECTED 09/08/2024 1235   THCU NONE DETECTED 09/08/2024 1235   LABBARB NONE DETECTED 09/08/2024 1235    Alcohol Level     Component Value Date/Time   ETH 158 (H) 09/08/2024 1051   INR  Lab Results  Component Value Date   INR 1.1 09/08/2024   APTT  Lab Results  Component Value Date   APTT 27 09/08/2024   AED levels: No results found for: PHENYTOIN, ZONISAMIDE, LAMOTRIGINE, LEVETIRACETA  CT Head without contrast(Personally reviewed): Radiology read concerning for diffused WM GM loss. My read, I agree with Dr. Vanessa, do not feel there is concerning edema.  Assessment   [name] is a 58 y.o. male PMH CAD, DM2, MI, OSA, toabacco use, presented after OOH cardiac arrest. Downtime not very clear but close to total 38 min  Prior to ROSC (20 min down, 18 min CPR). Had myoclonic jerks on presenation. Loaded with LEV and neurology consulted. On LTM EEG, on bedside review looks very suppressed. Some rhythmic foot subtle jerking without correlate on EEG. Suspect HIE or anoxic brain injury based on history and exam. Will defer advanced imaging and prognostication for 72h post even.  IMP: OOH cardiac arrest, evaluate for anoxic brain injury   Recommendations  Continue with current AED regimen Continue LTM Reduce propofol  as tolerated-to ensure clinical resolution of myoclonus. We will continue to follow along Discussed with PCCM Dr.  Olena  ______________________________________________________________________   Signed, Eligio Lav, MD [facility]   CRITICAL CARE ATTESTATION Performed by: Eligio Lav, MD Total critical care time: 33 minutes Critical care time was exclusive of separately billable procedures and treating other patients and/or supervising APPs/Residents/Students Critical care was necessary to treat or prevent imminent or life-threatening deterioration. This patient is critically ill and at significant risk for neurological worsening and/or death and care requires constant monitoring. Critical care was time spent personally by me on the following activities: development of treatment plan with patient and/or surrogate as well as nursing, discussions with consultants, evaluation of patient's response to treatment, examination of patient, obtaining history from patient or surrogate, ordering and performing treatments and interventions, ordering and review of laboratory studies, ordering and review of radiographic studies, pulse oximetry, re-evaluation of patient's condition, participation in multidisciplinary  rounds and medical decision making of high complexity in the care of this patient.

## 2024-09-09 NOTE — Progress Notes (Signed)
 Heart Failure Navigator Progress Note  Assessed for Heart & Vascular TOC clinic readiness.  Patient does not meet criteria due to he is a Advanced Heart Failure Team patient of Dr. Bensimhon. .   Navigator will sign off at this time.   Stephane Haddock, BSN, Scientist, Clinical (histocompatibility And Immunogenetics) Only

## 2024-09-09 NOTE — Progress Notes (Signed)
 NAME:  [name], MRN:  [id number], DOB:  1965-11-05, LOS: 1 ADMISSION DATE:  09/08/2024, CONSULTATION DATE:  09/08/24  CHIEF COMPLAINT:  S/p cardiac arrest   History of Present Illness:   16 M with T2DM, CAD, prior MI, and OSA who was BIBA after out-of-hospital cardiac arrest at a gas station. He is reported to have received 18 mins of CPR which commenced after an estimated 20 mins of downtime. At time of CCM evaluation in the ED, patient was intubated and exhibiting frequent myoclonus. He was loaded with Keppra , followed by valproate and deep sedation with propofol  at the recommendation of neurology.  Pertinent  Medical History   T2DM CAD Prior MI OSA Tobacco use  Significant Hospital Events: Including procedures, antibiotic start and stop dates in addition to other pertinent events   11/20: Cardiac arrest. BIBA. Myoclonus noted. Loaded with keppra  and valproate. Ceftriaxone  started. 11/21: Fever under [facility] [employer]. Presumed aspiration pneumonia. NGTD on cx.  Interim History / Subjective:   Myoclonus suppressed on propofol  + AEDs.  Objective    Blood pressure 108/78, pulse 68, temperature 98.1 F (36.7 C), temperature source Bladder, resp. rate 20, height 5' 11 (1.803 m), weight 67 kg, SpO2 100%. CVP:  [4 mmHg-6 mmHg] 4 mmHg  Vent Mode: PRVC FiO2 (%):  [40 %] 40 % Set Rate:  [20 bmp] 20 bmp Vt Set:  [600 mL] 600 mL PEEP:  [5 cmH20] 5 cmH20 Plateau Pressure:  [15 cmH20-20 cmH20] 19 cmH20   Intake/Output Summary (Last 24 hours) at 09/09/2024 2103 Last data filed at 09/09/2024 1800 Gross per 24 hour  Intake 1657.07 ml  Output 255 ml  Net 1402.07 ml   Filed Weights   09/08/24 1200 09/09/24 0500  Weight: 67 kg 67 kg    Examination: General: Intubated orotracheally. Normal body habitus. HEENT: Pupils are pinpoint and sluggishly reactive bilaterally. Lungs: Coarse mechanical breath sounds bilaterally and equally. Cardiovascular: Regular rate and rhythm, no  murmurs. Abdomen: Soft, NTND. Extremities: Non-edematous. Neuro: Performed on background of heavy sedation -- - no withdrawal to painful stimuli. Slow, rhythmic movement of bilateral feet. GU: Foley in place.  Resolved problem list   N/A  Assessment and Plan   # S/p cardiac arrest # Myoclonus # Acute hypoxemic respiratory failure # Metabolic acidosis, non-gap # AKI # Early LV thrombus # Acute systolic heart failure (post-arrest)  CNS: Post-arrest myoclonus -> Keppra  loaded, neuro c/s -> rec adding valproate and increasing propofol  to 80 mcg/kg/min in attempt to treat/suppress. cvEEG. Await MRI for neuro-prognostication on/around day 3. Will start to wean propofol  slowly to reassess for myoclonus. Cardiac: Levophed  for MAP >= 65 - currently requiring 5 mcg/min. Echo ordered -> LVEF 25-30% + moderate RV systolic dysfunction, estimated RAP 15 mmHg. LV apical sludge / likely early thrombus formation -> heparin  drip, cardiology c/s. Resp: Continue CTX course - initially administered to mitigate VAP risk post-arrest, however patient now having effective fever (water temp dropping into 20s on [place]) - suspect aspiration pneumonia. Ongoing MV with PRVC 600 x 20, 5, 40%. GI: Consult for tube feed recs. Tox: EtOH 158 on admission. MV/Thiamine . Utox negative.   DVT PPX: Heparin  drip as above. GI PPX: Pepcid  Code Status: DNR  Labs   CBC: Recent Labs  Lab 09/08/24 1044 09/08/24 1049 09/09/24 0425  WBC  --  1.9* 10.5  HGB 10.9* 10.9* 10.3*  HCT 32.0* 32.5* 28.1*  MCV  --  100.9* 94.3  PLT  --  147* 161  Basic Metabolic Panel: Recent Labs  Lab 09/08/24 1044 09/08/24 1049 09/08/24 2105 09/09/24 0425 09/09/24 1700  NA 134* 133* 133* 132* 135  K 3.5 3.6 2.7* 3.2* 3.9  CL  --  96* 95* 97* 102  CO2  --  15* 15* 16* 17*  GLUCOSE  --  144* 134* 151* 126*  BUN  --  9 12 13 10   CREATININE  --  1.14 1.19 1.53* 1.32*  CALCIUM   --  8.4* 8.9 8.6* 8.6*  MG  --  1.6* 2.3 2.1  --    PHOS  --  6.8* 3.0  --   --    GFR: Estimated Creatinine Clearance: 58.5 mL/min (A) (by C-G formula based on SCr of 1.32 mg/dL (H)). Recent Labs  Lab 09/08/24 1049 09/08/24 1050 09/08/24 2107 09/09/24 0425  WBC 1.9*  --   --  10.5  LATICACIDVEN  --  10.5* 1.3  --     Liver Function Tests: Recent Labs  Lab 09/08/24 1049 09/09/24 0425  AST 247* 115*  ALT 67* 57*  ALKPHOS 61 54  BILITOT 0.8 1.4*  PROT 6.5 6.4*  ALBUMIN  3.1* 2.9*   No results for input(s): LIPASE, AMYLASE in the last 168 hours. No results for input(s): AMMONIA in the last 168 hours.  ABG    Component Value Date/Time   PHART 7.371 09/08/2024 1044   PCO2ART 23.1 (L) 09/08/2024 1044   PO2ART 506 (H) 09/08/2024 1044   HCO3 13.4 (L) 09/08/2024 1044   TCO2 14 (L) 09/08/2024 1044   ACIDBASEDEF 10.0 (H) 09/08/2024 1044   O2SAT 100 09/09/2024 1043     Coagulation Profile: Recent Labs  Lab 09/08/24 1049  INR 1.1    Cardiac Enzymes: No results for input(s): CKTOTAL, CKMB, CKMBINDEX, TROPONINI in the last 168 hours.  HbA1C: Hgb A1c MFr Bld  Date/Time Value Ref Range Status  09/08/2024 01:43 PM 7.8 (H) 4.8 - 5.6 % Final    Comment:    (NOTE) Diagnosis of Diabetes The following HbA1c ranges recommended by the American Diabetes Association (ADA) may be used as an aid in the diagnosis of diabetes mellitus.  Hemoglobin             Suggested A1C NGSP%              Diagnosis  <5.7                   Non Diabetic  5.7-6.4                Pre-Diabetic  >6.4                   Diabetic  <7.0                   Glycemic control for                       adults with diabetes.    07/03/2022 07:35 PM 7.9 (H) 4.8 - 5.6 % Final    Comment:    (NOTE) Pre diabetes:          5.7%-6.4%  Diabetes:              >6.4%  Glycemic control for   <7.0% adults with diabetes     CBG: Recent Labs  Lab 09/09/24 0333 09/09/24 0758 09/09/24 1113 09/09/24 1542 09/09/24 2013  GLUCAP 146* 154*  133* 124* 135*    Critical care time: 50  minutes

## 2024-09-09 NOTE — Progress Notes (Signed)
 PHARMACY - ANTICOAGULATION CONSULT NOTE  Pharmacy Consult for Heparin   Indication: Apical thrombus  No Known Allergies  Patient Measurements: Height: 5' 11 (180.3 cm) Weight: 67 kg (147 lb 11.3 oz) IBW/kg (Calculated) : 75.3  Vital Signs: Temp: 98.6 F (37 C) (11/21 1300) Temp Source: Bladder (11/21 1111) BP: 114/83 (11/21 0800) Pulse Rate: 76 (11/21 1000)  Labs: Recent Labs    09/08/24 1044 09/08/24 1049 09/08/24 1343 09/08/24 1806 09/08/24 2105 09/09/24 0353 09/09/24 0425 09/09/24 1043  HGB 10.9* 10.9*  --   --   --   --  10.3*  --   HCT 32.0* 32.5*  --   --   --   --  28.1*  --   PLT  --  147*  --   --   --   --  161  --   APTT  --  27  --   --   --   --   --   --   LABPROT  --  14.7  --   --   --   --   --   --   INR  --  1.1  --   --   --   --   --   --   HEPARINUNFRC  --   --   --   --   --  0.47  --  0.51  CREATININE  --  1.14  --   --  1.19  --  1.53*  --   TROPONINIHS  --  49* 125* 174*  --   --   --   --     Estimated Creatinine Clearance: 50.5 mL/min (A) (by C-G formula based on SCr of 1.53 mg/dL (H)).   Assessment: 20 YOM with apical thrombus to continue on IV heparin .  Heparin  level remains therapeutic; CBC stable, no bleeding reported.  Goal of Therapy:  Heparin  level 0.3-0.7 units/ml Monitor platelets by anticoagulation protocol: Yes   Plan:  Continue heparin  infusion at 1000 units/hr Daily heparin  level and CBC  [place]ani Maiorino D. Lendell, PharmD, BCPS, BCCCP 09/09/2024, 1:31 PM

## 2024-09-09 NOTE — Progress Notes (Signed)
 eLink Physician-Brief Progress Note Patient Name: [name] DOB: January 24, 1966 MRN: [id number]   Date of Service  09/09/2024  HPI/Events of Note  Bicarb infusion is complete, ordered as a one-time dose  eICU Interventions  Complete the order, no indication to repeat     Intervention Category Minor Interventions: Routine modifications to care plan (e.g. PRN medications for pain, fever)  Jackquline Branca 09/09/2024, 8:25 PM

## 2024-09-09 NOTE — Progress Notes (Signed)
 Progress Note  Patient Name: [name] Date of Encounter: 09/09/2024  Primary Cardiologist: None   Subjective   Intermittent wide complex tachycardia noted overnight.  Patient remains intubated, sedated, undergoing cEE and rewarming  Inpatient Medications    Scheduled Meds:  Chlorhexidine  Gluconate Cloth  6 each Topical Daily   docusate  100 mg Per Tube BID   famotidine   20 mg Per Tube BID   fentaNYL  (SUBLIMAZE ) injection  25-50 mcg Intravenous Once   folic acid   1 mg Per Tube Daily   insulin  aspart  0-9 Units Subcutaneous Q4H   levETIRAcetam   1,000 mg Intravenous BID   multivitamin with minerals  1 tablet Per Tube Daily   nicotine   7 mg Transdermal Daily   polyethylene glycol  17 g Per Tube Daily   potassium chloride   40 mEq Per Tube Q4H   thiamine  (VITAMIN B1) injection  100 mg Intravenous Daily   Continuous Infusions:  sodium chloride      sodium chloride      sodium chloride  10 mL/hr at 09/09/24 0700   cefTRIAXone  (ROCEPHIN )  IV Stopped (09/08/24 1333)   fentaNYL  infusion INTRAVENOUS Stopped (09/08/24 1342)   heparin  1,000 Units/hr (09/09/24 0700)   norepinephrine  (LEVOPHED ) Adult infusion 5 mcg/min (09/09/24 0700)   propofol  (DIPRIVAN ) infusion 80 mcg/kg/min (09/09/24 0700)   PRN Meds: Place/Maintain arterial line **AND** sodium chloride , acetaminophen , docusate, fentaNYL , polyethylene glycol   Vital Signs    Vitals:   09/09/24 0545 09/09/24 0600 09/09/24 0615 09/09/24 0700  BP:      Pulse: 74 75 76 76  Resp: 20 20 20 20   Temp:  98.6 F (37 C)    TempSrc:      SpO2: 100% 100% 100% 100%  Weight:      Height:        Intake/Output Summary (Last 24 hours) at 09/09/2024 0709 Last data filed at 09/09/2024 0700 Gross per 24 hour  Intake 1482.65 ml  Output 630 ml  Net 852.65 ml   Filed Weights   09/08/24 1200 09/09/24 0500  Weight: 67 kg 67 kg    Telemetry    Normal sinus rhythm with frequent PVCs and nonsustained VT at 20:29 -  Personally Reviewed  ECG    NSR with RBBB with prolonged QTc (551 ms) - Personally Reviewed  Physical Exam   Physical Exam Vitals and nursing note reviewed.  Constitutional:      Interventions: He is sedated and intubated.  HENT:     Head:     Comments: EEG leads Cardiovascular:     Comments: Unable to auscultate due to cooling vest in place Pulmonary:     Effort: He is intubated.  Musculoskeletal:     Right lower leg: No edema.     Left lower leg: No edema.  Skin:    Comments: Warm extremities      Labs    Chemistry Recent Labs  Lab 09/08/24 1049 09/08/24 2105 09/09/24 0425  NA 133* 133* 132*  K 3.6 2.7* 3.2*  CL 96* 95* 97*  CO2 15* 15* 16*  GLUCOSE 144* 134* 151*  BUN 9 12 13   CREATININE 1.14 1.19 1.53*  CALCIUM  8.4* 8.9 8.6*  PROT 6.5  --  6.4*  ALBUMIN  3.1*  --  2.9*  AST 247*  --  115*  ALT 67*  --  57*  ALKPHOS 61  --  54  BILITOT 0.8  --  1.4*  GFRNONAA >60 >60 53*  ANIONGAP 22* 23* 19*  Hematology Recent Labs  Lab 09/08/24 1044 09/08/24 1049 09/09/24 0425  WBC  --  1.9* 10.5  RBC  --  3.22* 2.98*  HGB 10.9* 10.9* 10.3*  HCT 32.0* 32.5* 28.1*  MCV  --  100.9* 94.3  MCH  --  33.9 34.6*  MCHC  --  33.5 36.7*  RDW  --  12.4 12.3  PLT  --  147* 161    Cardiac EnzymesNo results for input(s): TROPONINI in the last 168 hours. No results for input(s): TROPIPOC in the last 168 hours.   BNPNo results for input(s): BNP, PROBNP in the last 168 hours.   DDimer No results for input(s): DDIMER in the last 168 hours.   Radiology    VAS US  LOWER EXTREMITY VENOUS (DVT) Result Date: 09/08/2024  Lower Venous DVT Study Patient Name:  Ramzy CURTIS Pernice  Date of Exam:   09/08/2024 Medical Rec #: [id number]             Accession #:    [id number] Date of Birth: [birth year]              Patient Gender: M Patient Age:   58 years Exam Location:  [facility] Procedure:      VAS US  LOWER EXTREMITY VENOUS (DVT) Referring Phys: NORLEEN CEDAR  --------------------------------------------------------------------------------  Indications: Edema, and Status post cardiac arrest with > 30 minutes of downtime and 18 minutes of CPR.  Limitations: Ventilation, involuntary patient movement. Comparison Study: Prior negative right LEV done 06/24/22 Performing Technologist: Alberta Lis RVS  Examination Guidelines: A complete evaluation includes B-mode imaging, spectral Doppler, color Doppler, and power Doppler as needed of all accessible portions of each vessel. Bilateral testing is considered an integral part of a complete examination. Limited examinations for reoccurring indications may be performed as noted. The reflux portion of the exam is performed with the patient in reverse Trendelenburg.  +---------+---------------+---------+-----------+----------+--------------+ RIGHT    CompressibilityPhasicitySpontaneityPropertiesThrombus Aging +---------+---------------+---------+-----------+----------+--------------+ CFV      Full           Yes      No                                  +---------+---------------+---------+-----------+----------+--------------+ SFJ      Full                                                        +---------+---------------+---------+-----------+----------+--------------+ FV Prox  Full                                                        +---------+---------------+---------+-----------+----------+--------------+ FV Mid   Full                                                        +---------+---------------+---------+-----------+----------+--------------+ FV DistalFull                                                        +---------+---------------+---------+-----------+----------+--------------+  PFV      Full                                                        +---------+---------------+---------+-----------+----------+--------------+ POP      Full           No       Yes                                  +---------+---------------+---------+-----------+----------+--------------+ PTV      Full                                                        +---------+---------------+---------+-----------+----------+--------------+ PERO     Full                                                        +---------+---------------+---------+-----------+----------+--------------+   +---------+---------------+---------+-----------+----------+--------------+ LEFT     CompressibilityPhasicitySpontaneityPropertiesThrombus Aging +---------+---------------+---------+-----------+----------+--------------+ CFV      Full           Yes      No                                  +---------+---------------+---------+-----------+----------+--------------+ SFJ      Full                                                        +---------+---------------+---------+-----------+----------+--------------+ FV Prox  Full                                                        +---------+---------------+---------+-----------+----------+--------------+ FV Mid   Full                                                        +---------+---------------+---------+-----------+----------+--------------+ FV DistalFull                                                        +---------+---------------+---------+-----------+----------+--------------+ PFV      Full                                                        +---------+---------------+---------+-----------+----------+--------------+  POP      Full           No       Yes                                 +---------+---------------+---------+-----------+----------+--------------+ PTV      Full                                                        +---------+---------------+---------+-----------+----------+--------------+ PERO     Full                                                         +---------+---------------+---------+-----------+----------+--------------+     Summary: RIGHT: - There is no evidence of deep vein thrombosis in the lower extremity.  - No cystic structure found in the popliteal fossa.  LEFT: - There is no evidence of deep vein thrombosis in the lower extremity.  - No cystic structure found in the popliteal fossa.  *See table(s) above for measurements and observations. Electronically signed by Debby Robertson on 09/08/2024 at 8:01:55 PM.    Final    ECHOCARDIOGRAM COMPLETE Result Date: 09/08/2024    ECHOCARDIOGRAM REPORT   Patient Name:   Reiner CURTIS Capron Date of Exam: 09/08/2024 Medical Rec #:  [id number]            Height:       71.0 in Accession #:    [id number]           Weight:       147.7 lb Date of Birth:  23-May-1966             BSA:          1.854 m Patient Age:    58 years             BP:           115/88 mmHg Patient Gender: M                    HR:           62 bpm. Exam Location:  Inpatient Procedure: 2D Echo, Cardiac Doppler, Color Doppler and Intracardiac            Opacification Agent (Both Spectral and Color Flow Doppler were            utilized during procedure). Indications:     I42.9 Cardiomyopathy (unspecified)  History:         Patient has prior history of Echocardiogram examinations, most                  recent 07/04/2022. CAD, Abnormal ECG and Prior CABG,                  Arrythmias:Cardiac Arrest, Signs/Symptoms:Chest Pain; Risk                  Factors:Hypertension, Current Smoker and Dyslipidemia. ETOH.  Sonographer:     Ellouise Mose RDCS Referring Phys:  [id number] MNAZMU LOCKWOOD Diagnosing Phys: Soyla Merck MD  Sonographer Comments: Technically difficult  study due to poor echo windows, echo performed with patient supine and on artificial respirator and suboptimal parasternal window. Patient slightly right decubitus on vent. IMPRESSIONS  1. LV apical sludge with probable early thrombus formation. Left ventricular ejection fraction, by estimation, is 25 to  30%. The left ventricle has severely decreased function. The left ventricle demonstrates regional wall motion abnormalities (see scoring diagram/findings for description). There is mild left ventricular hypertrophy. Left ventricular diastolic parameters are consistent with Grade I diastolic dysfunction (impaired relaxation).  2. Right ventricular systolic function is moderately reduced. The right ventricular size is normal. Tricuspid regurgitation signal is inadequate for assessing PA pressure.  3. The mitral valve is degenerative. Trivial mitral valve regurgitation. No evidence of mitral stenosis.  4. The aortic valve was not well visualized. Aortic valve regurgitation is not visualized. No aortic stenosis is present.  5. The inferior vena cava is dilated in size with <50% respiratory variability, suggesting right atrial pressure of 15 mmHg. FINDINGS  Left Ventricle: LV apical sludge with probable early thrombus formation. Left ventricular ejection fraction, by estimation, is 25 to 30%. The left ventricle has severely decreased function. The left ventricle demonstrates regional wall motion abnormalities. Definity  contrast agent was given IV to delineate the left ventricular endocardial borders. The left ventricular internal cavity size was normal in size. There is mild left ventricular hypertrophy. Left ventricular diastolic parameters are  consistent with Grade I diastolic dysfunction (impaired relaxation).  LV Wall Scoring: The mid and distal lateral wall, mid and distal anterior septum, apical anterior segment, and apical inferior segment are akinetic. The anterior wall, antero-lateral wall, inferior wall, basal anteroseptal segment, basal inferolateral segment, mid inferoseptal segment, and basal inferoseptal segment are hypokinetic. Right Ventricle: The right ventricular size is normal. No increase in right ventricular wall thickness. Right ventricular systolic function is moderately reduced. Tricuspid  regurgitation signal is inadequate for assessing PA pressure. Left Atrium: Left atrial size was normal in size. Right Atrium: Right atrial size was normal in size. Pericardium: There is no evidence of pericardial effusion. Mitral Valve: The mitral valve is degenerative in appearance. Mild to moderate mitral annular calcification. Trivial mitral valve regurgitation. No evidence of mitral valve stenosis. Tricuspid Valve: The tricuspid valve is not well visualized. Tricuspid valve regurgitation is not demonstrated. No evidence of tricuspid stenosis. Aortic Valve: The aortic valve was not well visualized. Aortic valve regurgitation is not visualized. No aortic stenosis is present. Pulmonic Valve: The pulmonic valve was not well visualized. Pulmonic valve regurgitation is not visualized. No evidence of pulmonic stenosis. Aorta: The ascending aorta was not well visualized. Venous: The pulmonary veins were not well visualized. The inferior vena cava is dilated in size with less than 50% respiratory variability, suggesting right atrial pressure of 15 mmHg. IAS/Shunts: No atrial level shunt detected by color flow Doppler.  LEFT VENTRICLE PLAX 2D LVIDd:         4.85 cm     Diastology LVIDs:         4.40 cm     LV e' medial:    5.22 cm/s LV PW:         1.10 cm     LV E/e' medial:  8.8 LV IVS:        1.10 cm     LV e' lateral:   3.81 cm/s LVOT diam:     2.30 cm     LV E/e' lateral: 12.1 LV SV:         49 LV SV Index:  27 LVOT Area:     4.15 cm  LV Volumes (MOD) LV vol d, MOD A2C: 76.9 ml LV vol d, MOD A4C: 77.2 ml LV vol s, MOD A2C: 62.2 ml LV vol s, MOD A4C: 49.2 ml LV SV MOD A2C:     14.7 ml LV SV MOD A4C:     77.2 ml LV SV MOD BP:      23.6 ml IVC IVC diam: 2.10 cm LEFT ATRIUM             Index        RIGHT ATRIUM           Index LA diam:        2.30 cm 1.24 cm/m   RA Area:     10.80 cm LA Vol (A2C):   20.2 ml 10.90 ml/m  RA Volume:   22.40 ml  12.08 ml/m LA Vol (A4C):   26.0 ml 14.03 ml/m LA Biplane Vol: 23.9 ml  12.89 ml/m  AORTIC VALVE LVOT Vmax:   74.90 cm/s LVOT Vmean:  47.200 cm/s LVOT VTI:    0.119 m MITRAL VALVE MV Area (PHT): 2.80 cm    SHUNTS MV Decel Time: 271 msec    Systemic VTI:  0.12 m MV E velocity: 46.10 cm/s  Systemic Diam: 2.30 cm MV A velocity: 58.20 cm/s MV E/A ratio:  0.79 Soyla Merck MD Electronically signed by Soyla Merck MD Signature Date/Time: 09/08/2024/3:47:08 PM    Final (Updated)    DG Abd 1 View Result Date: 09/08/2024 EXAM: 1 VIEW XRAY OF THE ABDOMEN 09/08/2024 03:16:00 PM COMPARISON: 07/02/2022 CLINICAL HISTORY: [id number] Encounter for central line care [phone]; [phone] Encounter for orogastric (OG) tube placement [id number] FINDINGS: LINES, TUBES AND DEVICES: Distal tip of the nasogastric tube seen in the expected position of stomach. BOWEL: Nonobstructive bowel gas pattern. SOFT TISSUES: No opaque urinary calculi. BONES: No acute osseous abnormality. IMPRESSION: 1. Distal tip of the nasogastric tube in the expected position of the stomach. Electronically signed by: Lynwood Seip MD 09/08/2024 03:29 PM EST RP Workstation: [id number]   DG CHEST PORT 1 VIEW Result Date: 09/08/2024 EXAM: 1 VIEW(S) XRAY OF THE CHEST 09/08/2024 03:16:00 PM COMPARISON: Comparison is made to a prior study from the same day. CLINICAL HISTORY: Encounter for central line care; Encounter for orogastric (OG) tube placement. FINDINGS: LINES, TUBES AND DEVICES: Left internal jugular catheter with distal tip in the expected position of the cavoatrial junction. Otherwise stable support apparatus. LUNGS AND PLEURA: No focal pulmonary opacity. No pleural effusion. No pneumothorax. HEART AND MEDIASTINUM: No acute abnormality of the cardiac and mediastinal silhouettes. BONES AND SOFT TISSUES: No acute osseous abnormality. IMPRESSION: 1. Left internal jugular catheter with distal tip at the cavoatrial junction, in expected position. Electronically signed by: Lynwood Seip MD 09/08/2024 03:28 PM EST RP Workstation: [id number]    CT HEAD WO CONTRAST Result Date: 09/08/2024 EXAM: CT HEAD WITHOUT CONTRAST 09/08/2024 12:17:08 PM TECHNIQUE: CT of the head was performed without the administration of intravenous contrast. Automated exposure control, iterative reconstruction, and/or weight based adjustment of the mA/kV was utilized to reduce the radiation dose to as low as reasonably achievable. COMPARISON: CT head 04/27/2015 CLINICAL HISTORY: Mental status change, unknown cause FINDINGS: BRAIN AND VENTRICLES: No acute hemorrhage. Equivocal subtle diffuse loss of gray-white differentiation without mass effect. No hydrocephalus. No extra-axial collection. No mass effect or midline shift. ORBITS: No acute abnormality. SINUSES: No acute abnormality. SOFT TISSUES AND SKULL: No skull fracture. IMPRESSION: 1. Equivocal subtle diffuse  loss of gray-white differentiation without mass effect. An MRI could provide more sensitive evaluation for hypoxic/ischemic insult if clinically warranted. Electronically signed by: Gilmore Molt MD 09/08/2024 12:35 PM EST RP Workstation: [id number]   DG Chest Port 1 View Result Date: 09/08/2024 EXAM: 1 VIEW(S) XRAY OF THE CHEST 09/08/2024 10:53:00 AM COMPARISON: 07/21/2022. CLINICAL HISTORY: Cardiac arrest, OG tube placement, patient found down FINDINGS: LINES, TUBES AND DEVICES: Endotracheal tube in place with tip about 7 cm above the carina. Enteric tube in place terminating in the stomach, side hole at the level of the gastric body. Pacer or resuscitation pads over the chest. LUNGS AND PLEURA: Low lung volumes. No focal pulmonary opacity. No pleural effusion. No pneumothorax. HEART AND MEDIASTINUM: No acute abnormality of the cardiac and mediastinal silhouettes. BONES AND SOFT TISSUES: Sternotomy wires and CABG noted. Chronic left lateral rib fractures. Paucity of bowel gas. IMPRESSION: 1. Support devices in appropriate position. 2. Low lung volumes. 3. No acute cardiopulmonary findings. Electronically signed  by: Donnice Mania MD 09/08/2024 11:45 AM EST RP Workstation: [id number]    Cardiac Studies   Echocardiogram 09/08/24: LV apical sludge with probable early thrombus formation.  Left ventricular ejection fraction, by estimation, is 25 to 30%. The left ventricle has severely decreased function. The mid and distal lateral wall, mid and distal anterior septum, apical anterior segment, and apical inferior segment are akinetic. The anterior wall, antero-lateral wall, inferior wall, basal anteroseptal segment, basal inferolateral segment, mid inferoseptal segment, and basal inferoseptal segment are hypokinetic.  Grade I diastolic dysfunction (impaired relaxation).  Right ventricular systolic function is moderately reduced. The right ventricular size is normal.  The mitral valve is degenerative. Trivial mitral valve regurgitation. No evidence of mitral stenosis.  No aortic stenosis is present.  Right atrial pressure of 15 mmHg.   CMRI 07/02/22: EF 29% with really reduced systolic function moderately dilated LV Mild asymmetric septal hypertrophy Wall motion abnormalities:  basal inferior, inferoseptal and inferolateral hypokinesis mid anteroseptal and mid inferior lateral and anterolateral hypokinesis severe apical hypokinesis without LV thrombus Mild to moderate MR with 23% regurgitant fraction and likely posterior leaflet restriction Study consistent with multivessel disease  R/LHC 06/25/22: 1.  Advanced multivessel cor Neri artery disease with severe distal left main disease, very severe ostial circumflex stenosis, subtotal occlusion of the mid circumflex with occlusion of the OM branches, severe in-stent restenosis in the mid LAD, diffuse distal LAD disease, and severe mid RCA stenosis 2.  Severe mitral regurgitation by noninvasive assessment and by hemodynamics with 34 mm V waves in the pulmonary wedge tracing 3.  Congestive heart failure with elevated wedge pressure of 26, low PA oxygen   saturation of 49%, but preserved cardiac output of 5.4 L/min by Fick assessment 4.  Successful placement of right internal jugular triple-lumen catheter for infusion of milrinone  and central monitoring if needed    Patient Profile     This is a 58 year old male with a history of multivessel disease s/p CABG x 4 (LIMA-LAD, RSVG from aorta to RPDA (acute marginal extension), RSVG from aorta to ramus sequenced to the distal OM), ischemic mitral regurgitation, HFmrEF who was found down at a gas station bathroom for about 30 minutes before receiving 18 minutes of CPR. He was intubated by EMS. Cardiology was consulted on 11/20 for cardiac arrest. Interventional cardiology reviewed case on presentation and did not believe the patient needed urgent/emergent cath. Has had myoclonic jerking vs seizure like activity prompting a neurology consultation. An echocardiogram showed severely reduced left  ventricular systolic function with concern for a developing apical thrombus and moderately reduced right ventricular function. He has had prolonged Qtc and intermittent episodes of nonsustained VT in the setting of electrolyte disturbances.   Assessment & Plan   Cardiac arrest, unknown etiology-unlikely ACS due to minimally elevated troponin and no pathologic ST changes on EKG. Cooling protocol underway. Patient will eventually need cardiac catheterization, prognosis guarded. Echo as above.  Acute on chronic heart failure in the setting of cardiac arrest - EF 25-30% with akinesis of the mid and distal lateral wall, mid and distal anterior septum, apical anterior segment, and apical inferior segment. The anterior wall, antero-lateral wall, inferior wall, basal anteroseptal segment, basal inferolateral segment, mid inferoseptal segment, and basal inferoseptal segment are hypokinetic. Holding GDMT due to shock and AKI. Will check a BNP, COOX and CVP Undifferentiated shock, unlikely cardiogenic - lactic acidosis has resolved  and LFTs are minimally elevated. Extremities are warm. Levophed  requirements improving, currently on 5, wean off as able. Will check a BNP, COOX and CVP.  LV apical thrombus - heparin  drip started 11/20 Frequent PVCs with nonsustained VT - correct electrolytes, goal K> 4.0 and Mg > 2.0. Holding amio due to prolonged QT Prolonged Qtc - correct electrolyte abnormalities and hold QT prolonging agents. Holding amiodarone  Acute respiratory failure postcardiac arrest - intubated 11/20 Acute encephalopathy s/p cardiac arrest - undergoing cEEG. On Antiepileptic agents Alcohol and tobacco abuse CAD s/p CABG x 4  (LIMA-LAD, RSVG from aorta to RPDA (acute marginal extension), RSVG from aorta to ramus sequen ced to the distal OM) Hypertension Hyperlipidemia Type 2 diabetes OSA   Time spent coordinating care: 66 minutes     For questions or updates, please contact [facility] Please consult [email or web address] for contact info under        Signed, Emeline Calender, DO 09/09/2024, 7:09 AM

## 2024-09-10 ENCOUNTER — Inpatient Hospital Stay (HOSPITAL_COMMUNITY)

## 2024-09-10 DIAGNOSIS — I469 Cardiac arrest, cause unspecified: Secondary | ICD-10-CM | POA: Diagnosis not present

## 2024-09-10 DIAGNOSIS — G931 Anoxic brain damage, not elsewhere classified: Secondary | ICD-10-CM | POA: Diagnosis not present

## 2024-09-10 DIAGNOSIS — R569 Unspecified convulsions: Secondary | ICD-10-CM

## 2024-09-10 LAB — CBC
HCT: 27.4 % — ABNORMAL LOW (ref 39.0–52.0)
Hemoglobin: 9.8 g/dL — ABNORMAL LOW (ref 13.0–17.0)
MCH: 34.3 pg — ABNORMAL HIGH (ref 26.0–34.0)
MCHC: 35.8 g/dL (ref 30.0–36.0)
MCV: 95.8 fL (ref 80.0–100.0)
Platelets: 147 K/uL — ABNORMAL LOW (ref 150–400)
RBC: 2.86 MIL/uL — ABNORMAL LOW (ref 4.22–5.81)
RDW: 12.9 % (ref 11.5–15.5)
WBC: 8.5 K/uL (ref 4.0–10.5)
nRBC: 0 % (ref 0.0–0.2)

## 2024-09-10 LAB — BASIC METABOLIC PANEL WITH GFR
Anion gap: 16 — ABNORMAL HIGH (ref 5–15)
BUN: 10 mg/dL (ref 6–20)
CO2: 19 mmol/L — ABNORMAL LOW (ref 22–32)
Calcium: 8.5 mg/dL — ABNORMAL LOW (ref 8.9–10.3)
Chloride: 98 mmol/L (ref 98–111)
Creatinine, Ser: 1.1 mg/dL (ref 0.61–1.24)
GFR, Estimated: >60 mL/min (ref >60)
Glucose, Bld: 213 mg/dL — ABNORMAL HIGH (ref 70–99)
Potassium: 3.4 mmol/L — ABNORMAL LOW (ref 3.5–5.1)
Sodium: 133 mmol/L — ABNORMAL LOW (ref 135–145)

## 2024-09-10 LAB — HEPARIN LEVEL (UNFRACTIONATED): Heparin Unfractionated: 0.33 [IU]/mL (ref 0.30–0.70)

## 2024-09-10 LAB — GLUCOSE, CAPILLARY
Glucose-Capillary: 180 mg/dL — ABNORMAL HIGH (ref 70–99)
Glucose-Capillary: 243 mg/dL — ABNORMAL HIGH (ref 70–99)
Glucose-Capillary: 188 mg/dL — ABNORMAL HIGH (ref 70–99)
Glucose-Capillary: 146 mg/dL — ABNORMAL HIGH (ref 70–99)
Glucose-Capillary: 114 mg/dL — ABNORMAL HIGH (ref 70–99)
Glucose-Capillary: 111 mg/dL — ABNORMAL HIGH (ref 70–99)

## 2024-09-10 LAB — MAGNESIUM: Magnesium: 1.9 mg/dL (ref 1.7–2.4)

## 2024-09-10 LAB — PHOSPHORUS: Phosphorus: 2.1 mg/dL — ABNORMAL LOW (ref 2.5–4.6)

## 2024-09-10 MED ORDER — POTASSIUM CHLORIDE 20 MEQ PO PACK
40.0000 meq | PACK | Freq: Once | ORAL | Status: AC
  Administered 2024-09-10: 40 meq
  Filled 2024-09-10: qty 2

## 2024-09-10 MED ORDER — ACETAMINOPHEN 325 MG PO TABS
650.0000 mg | ORAL_TABLET | ORAL | Status: DC
  Administered 2024-09-10 – 2024-09-13 (×17): 650 mg
  Filled 2024-09-10 (×18): qty 2

## 2024-09-10 MED ORDER — LORAZEPAM 2 MG/ML IJ SOLN
2.0000 mg | Freq: Once | INTRAMUSCULAR | Status: AC
  Administered 2024-09-10: 2 mg via INTRAVENOUS

## 2024-09-10 MED ORDER — LORAZEPAM 2 MG/ML IJ SOLN
INTRAMUSCULAR | Status: AC
  Filled 2024-09-10: qty 1

## 2024-09-10 MED ORDER — POTASSIUM & SODIUM PHOSPHATES 280-160-250 MG PO PACK
2.0000 | PACK | Freq: Three times a day (TID) | ORAL | Status: AC
  Administered 2024-09-10 – 2024-09-11 (×3): 2
  Filled 2024-09-10 (×3): qty 2

## 2024-09-10 MED ORDER — POTASSIUM CHLORIDE 20 MEQ PO PACK
40.0000 meq | PACK | Freq: Once | ORAL | Status: AC
Start: 2024-09-10 — End: 2024-09-10
  Administered 2024-09-10: 40 meq
  Filled 2024-09-10: qty 2

## 2024-09-10 MED ORDER — VITAL HP 1.0 CAL PO LIQD
1000.0000 mL | ORAL | Status: DC
  Administered 2024-09-10 – 2024-09-12 (×3): 1000 mL

## 2024-09-10 MED ORDER — PROSOURCE TF20 ENFIT COMPATIBL EN LIQD
60.0000 mL | Freq: Every day | ENTERAL | Status: DC
  Administered 2024-09-10 – 2024-09-16 (×7): 60 mL
  Filled 2024-09-10 (×7): qty 60

## 2024-09-10 NOTE — Progress Notes (Signed)
 NEUROLOGY CONSULT FOLLOW UP NOTE   Date of service: September 10, 2024 Patient Name: [name] MRN:  [id number] DOB:  1966/01/24  Interval Hx/subjective   Sedated on Propofol , weaning at 10/hour No cough/gag/corneal reflex, negative dolls eyes LTM shows evidence of profound encephalopathy.   Vitals   Vitals:   09/10/24 0600 09/10/24 0700 09/10/24 0800 09/10/24 0805  BP:      Pulse: 76 71 70 70  Resp: 20 20 20 20   Temp: 98.1 F (36.7 C) 97.7 F (36.5 C) 97.7 F (36.5 C) 97.7 F (36.5 C)  TempSrc: Esophageal Esophageal Esophageal   SpO2: 100% 100% 100% 100%  Weight:      Height:         Body mass index is 21.8 kg/m.  Physical Exam  Gen: Sedated, intubated HEENT: [place] AT CVS: RRR Chest: vented  NEUROLOGICAL EXAM Sedated, intubated Sedation held for exam No spontaneous movement noted No movement to nox stim Pupils pin point with extremely sluggish response. Corneals absent bilaterally Negative cough and gag Minimal left foot rhythmic movement with stimulus   Medications  Current Facility-Administered Medications:    acetaminophen  (TYLENOL ) tablet 650 mg, 650 mg, Per Tube, Q4H PRN, Payne, John D, PA-C   cefTRIAXone  (ROCEPHIN ) 2 g in sodium chloride  0.9 % 100 mL IVPB, 2 g, Intravenous, Q24H, Payne, John D, PA-C, Stopped at 09/09/24 1347   Chlorhexidine  Gluconate Cloth 2 % PADS 6 each, 6 each, Topical, Daily, Garrick Charleston, MD, 6 each at 09/09/24 2200   docusate (COLACE) 50 MG/5ML liquid 100 mg, 100 mg, Per Tube, BID PRN, Stretch, Charleston PARAS, MD   docusate (COLACE) 50 MG/5ML liquid 100 mg, 100 mg, Per Tube, BID, Payne, John D, PA-C, 100 mg at 09/09/24 9140   famotidine  (PEPCID ) tablet 20 mg, 20 mg, Per Tube, BID, Payne, John D, PA-C, 20 mg at 09/09/24 2150   fentaNYL  (SUBLIMAZE ) bolus via infusion 25-100 mcg, 25-100 mcg, Intravenous, Q15 min PRN, Garrick Charleston, MD, 50 mcg at 09/08/24 1108   folic acid  (FOLVITE ) tablet 1 mg, 1 mg, Per Tube, Daily, Payne,  John D, PA-C, 1 mg at 09/09/24 0855   heparin  ADULT infusion 100 units/mL (25000 units/250mL), 1,100 Units/hr, Intravenous, Continuous, Stretch, Charleston PARAS, MD, Last Rate: 11 mL/hr at 09/10/24 0718, 1,100 Units/hr at 09/10/24 0718   insulin  aspart (novoLOG ) injection 0-9 Units, 0-9 Units, Subcutaneous, Q4H, Payne, John D, PA-C, 2 Units at 09/10/24 0405   levETIRAcetam  (KEPPRA ) undiluted injection 1,000 mg, 1,000 mg, Intravenous, BID, Khaliqdina, Salman, MD, 1,000 mg at 09/09/24 2150   multivitamin with minerals tablet 1 tablet, 1 tablet, Per Tube, Daily, Emilio Norleen BIRCH, PA-C, 1 tablet at 09/09/24 9144   nicotine  (NICODERM CQ  - dosed in mg/24 hr) patch 7 mg, 7 mg, Transdermal, Daily, Payne, John D, PA-C, 7 mg at 09/09/24 9087   norepinephrine  (LEVOPHED ) 4mg  in (0.016 mg/mL) premix infusion, 0-40 mcg/min, Intravenous, Titrated, Emilio, John D, PA-C, Last Rate: 7.5 mL/hr at 09/10/24 0600, 2 mcg/min at 09/10/24 0600   Oral care mouth rinse, 15 mL, Mouth Rinse, Q2H, Stretch, Charleston PARAS, MD, 15 mL at 09/10/24 9491   Oral care mouth rinse, 15 mL, Mouth Rinse, PRN, Stretch, Charleston PARAS, MD   polyethylene glycol (MIRALAX  / GLYCOLAX ) packet 17 g, 17 g, Per Tube, Daily PRN, Payne, John D, PA-C   polyethylene glycol (MIRALAX  / GLYCOLAX ) packet 17 g, 17 g, Per Tube, Daily, Payne, John D, PA-C   potassium chloride  (KLOR-CON ) packet 40 mEq, 40  mEq, Per Tube, Once, Paliwal, Aditya, MD   potassium chloride  (KLOR-CON ) packet 40 mEq, 40 mEq, Per Tube, Once, Stretch, Lamar PARAS, MD   propofol  (DIPRIVAN ) 1000 MG/100ML infusion, 80 mcg/kg/min, Intravenous, Titrated, Khaliqdina, Salman, MD, Last Rate: 32.2 mL/hr at 09/10/24 0600, 80 mcg/kg/min at 09/10/24 0600   thiamine  (VITAMIN B1) injection 100 mg, 100 mg, Intravenous, Daily, Emilio Norleen BIRCH, PA-C, 100 mg at 09/09/24 0857  Labs and Diagnostic Imaging   CBC:  Recent Labs  Lab 09/09/24 0425 09/10/24 0305  WBC 10.5 8.5  HGB 10.3* 9.8*  HCT 28.1* 27.4*  MCV 94.3 95.8   PLT 161 147*    Basic Metabolic Panel:  Lab Results  Component Value Date   NA 133 (L) 09/10/2024   K 3.4 (L) 09/10/2024   CO2 19 (L) 09/10/2024   GLUCOSE 213 (H) 09/10/2024   BUN 10 09/10/2024   CREATININE 1.10 09/10/2024   CALCIUM  8.5 (L) 09/10/2024   GFRNONAA >60 09/10/2024   GFRAA >60 04/07/2020   Lipid Panel:  Lab Results  Component Value Date   LDLCALC 147 (H) 10/07/2022   HgbA1c:  Lab Results  Component Value Date   HGBA1C 7.8 (H) 09/08/2024   Urine Drug Screen:     Component Value Date/Time   LABOPIA NONE DETECTED 09/08/2024 1235   COCAINSCRNUR NONE DETECTED 09/08/2024 1235   LABBENZ NONE DETECTED 09/08/2024 1235   AMPHETMU NONE DETECTED 09/08/2024 1235   THCU NONE DETECTED 09/08/2024 1235   LABBARB NONE DETECTED 09/08/2024 1235    Alcohol Level     Component Value Date/Time   ETH 158 (H) 09/08/2024 1051   INR  Lab Results  Component Value Date   INR 1.1 09/08/2024   APTT  Lab Results  Component Value Date   APTT 27 09/08/2024   AED levels: No results found for: PHENYTOIN, ZONISAMIDE, LAMOTRIGINE, LEVETIRACETA  CT Head without contrast(Personally reviewed): Radiology read concerning for diffused WM GM loss. My read, I agree with Dr. Vanessa, do not feel there is concerning edema.  LTM EEG 11/21-11/22 - burst suppression  Assessment   [name] is a 58 y.o. male PMH CAD, DM2, MI, OSA, toabacco use, presented after OOH cardiac arrest. Downtime not very clear but close to total 38 min  Prior to ROSC (20 min down, 18 min CPR). Myoclonic jerks seen on presentation. He was loaded with Keppra  and neurology was consulted.   On exam has some L foot stimulus-induced myoclonus. In burst suppression on propofol .  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 Will defer formal prognostication for 72hours post-event with imaging.   Recommendations  Continue with current AED regimen Continue  LTM Gently wean propofol  as tolerated MRI at 72 hours  We will continue to follow along   _______________________________________________________________  Pt seen by Neuro NP/APP and later by MD. Note/plan to be edited by MD as needed.    Rocky JAYSON Likes, DNP [facility] Please use AMION for contact information & EPIC for messaging.   Attending Neurohospitalist Addendum Patient seen and examined with APP/Resident. Agree with the history and physical as documented above. Agree with the plan as documented, which I helped formulate. I have edited the note above to reflect my full findings and recommendations. I have independently reviewed the chart, obtained history, review of systems and examined the patient.I have personally reviewed pertinent head/neck/spine imaging (CT/MRI). Please feel free to call with any questions.  This patient is critically ill and at  significant risk of neurological worsening, death and care requires constant monitoring of vital signs, hemodynamics,respiratory and cardiac monitoring, neurological assessment, discussion with family, other specialists and medical decision making of high complexity. I spent 45 minutes of neurocritical care time  in the care of  this patient. This was time spent independent of any time provided by nurse practitioner or PA.  Elida Ross, MD [facility] [phone]  If 7pm- 7am, please page neurology on call as listed in AMION.

## 2024-09-10 NOTE — Progress Notes (Signed)
 PHARMACY - ANTICOAGULATION CONSULT NOTE  Pharmacy Consult for Heparin   Indication: Apical thrombus  No Known Allergies  Patient Measurements: Height: 5' 11 (180.3 cm) Weight: 70.9 kg (156 lb 4.9 oz) IBW/kg (Calculated) : 75.3  Vital Signs: Temp: 98.1 F (36.7 C) (11/22 0600) Temp Source: Esophageal (11/22 0600) BP: 108/78 (11/21 2000) Pulse Rate: 76 (11/22 0600)  Labs: Recent Labs    09/08/24 1049 09/08/24 1343 09/08/24 1806 09/08/24 2105 09/09/24 0353 09/09/24 0425 09/09/24 1043 09/09/24 1700 09/10/24 0305  HGB 10.9*  --   --   --   --  10.3*  --   --  9.8*  HCT 32.5*  --   --   --   --  28.1*  --   --  27.4*  PLT 147*  --   --   --   --  161  --   --  147*  APTT 27  --   --   --   --   --   --   --   --   LABPROT 14.7  --   --   --   --   --   --   --   --   INR 1.1  --   --   --   --   --   --   --   --   HEPARINUNFRC  --   --   --   --  0.47  --  0.51  --  0.33  CREATININE 1.14  --   --    < >  --  1.53*  --  1.32* 1.10  TROPONINIHS 49* 125* 174*  --   --   --   --   --   --    < > = values in this interval not displayed.    Estimated Creatinine Clearance: 74.3 mL/min (by C-G formula based on SCr of 1.1 mg/dL).   Assessment: [name] is a 58 y.o. year old male admitted on 09/08/2024 with concern for apical thrombus. No anticoagulation prior to admission. Pharmacy consulted to dose heparin .  Heparin  level 0.33, therapeutic on lower end of goal No overt s/sx of bleeding. CBC stable (hgb ~10). No issues with infusion noted.  Heparin  infusion running at 1000 units/hr.   Goal of Therapy:  Heparin  level 0.3-0.7 units/ml Monitor platelets by anticoagulation protocol: Yes   Plan:  Increase heparin  infusion to 1100 units/hr Daily heparin  level, CBC, and monitoring for bleeding F/u plans for anticoagulation and GOC  Thank you for allowing pharmacy to participate in this patient's care.  Leonor GORMAN Bash, PharmD Emergency Medicine Clinical  Pharmacist 09/10/2024,7:09 AM

## 2024-09-10 NOTE — Progress Notes (Signed)
 Coox 100.   From cardiac perspective, continue IV heparin  for now.  Remainder per PCCM and Neuro.  Cardiology will see as needed over the weekend pending clinical trajectory.   Rushton Early A Amadi Yoshino, MD

## 2024-09-10 NOTE — Progress Notes (Addendum)
 This RN observed patient having repeated full body jerking movements. Button pressed on EEG, Elink MD and Neurology notified. Order for 2mg  of Ativan .  2304- Tube feeds on hold per Elink.  [name] C Naithen [place]

## 2024-09-10 NOTE — Procedures (Signed)
 Patient Name: Saivon Prowse  MRN: [id number]  Epilepsy Attending: Arlin MALVA Krebs  Referring Physician/Provider: Khaliqdina, Salman, MD Duration: 09/09/2024 9175 to 09/10/2024 9175  Patient history: 58yo M sp cardiac arrest. EEG to evaluate for seizure  Level of alertness: comatose  AEDs during EEG study: Propofol , LEV  Technical aspects: This EEG study was done with scalp electrodes positioned according to the 10-20 International system of electrode placement. Electrical activity was reviewed with band pass filter of 1-70Hz , sensitivity of 7 uV/mm, display speed of 28mm/sec with a 60Hz  notched filter applied as appropriate. EEG data were recorded continuously and digitally stored.  Video monitoring was available and reviewed as appropriate.  Description: At the beginning of the study, EEG showed burst suppression pattern with burst of 3-5hz  theta-delta slowing lasting 2-3 seconds alternating with 10-15 seconds of generalized suppression. Gradually bursts abated and eeg showed continuous generalized background suppression. Hyperventilation and photic stimulation were not performed.     ABNORMALITY - Burst suppression, generalized  IMPRESSION: This study is suggestive of profound diffuse encephalopathy. No seizures or epileptiform discharges were seen throughout the recording.  Elycia Woodside O Clemente Dewey

## 2024-09-10 NOTE — Progress Notes (Signed)
 MB performed maintenance on electrodes. All impedances are below 10k ohms. No skin breakdown noted. EEG data running and recording. Verified [facility] is able to monitor.

## 2024-09-10 NOTE — Progress Notes (Addendum)
 NAME:  [name], MRN:  [id number], DOB:  Apr 04, 1966, LOS: 2 ADMISSION DATE:  09/08/2024, CONSULTATION DATE:  09/08/2024 CHIEF COMPLAINT:  Cardiac arrest (OO[facility])  Summary  [name] is a 58 year old male with past medical history significant for multivessel CAD, HTN, HLD, type 2 DM who presented via EMS following cardiac arrest. Patient was found down in gas station bathroom for approx 33 minutes. ROSC achieved after 18 minutes of CPR, 1 Epi, 50 mcg Fentanyl , 500ml NS. Patient arrived via EMS intubated. In ED, patient received Narcan  with no improvement in neurological status, an arterial line was placed for hypotension, and patient was started on vasopressors. PCCM consulted for ICU evaluation post-cardiac arrest. Found patient to have frequent myoclonus, started propofol  and loaded with Keppra  and valproate.  Pertinent  Medical History   Past Medical History:  Diagnosis Date   Acid reflux    Concussion    Coronary artery disease    Diabetes (HCC)    ETOH abuse    High cholesterol    Hypertension    MI (myocardial infarction) (HCC)    2012   Sleep apnea    USES CPAP AS NEEDED   Tobacco abuse      Significant Hospital Events: Including procedures, antibiotic start and stop dates in addition to other pertinent events   11/20: Cardiac arrest, intubation, myoclonus observed, propofol  + Keppra  + valproate loaded. 11/21: Rhythmic bilateral foot movements observed. 11/22: Start propofol  wean while assessing for return of myoclonus.  Interim History / Subjective:  Spiking fever beneath temperature control of the [employer] [facility] [employer] (based on drops in water temp into 20's required to maintain normothermia). No other changes.  Objective    Blood pressure 131/87, pulse 75, temperature 97.9 F (36.6 C), temperature source Esophageal, resp. rate 20, height 5' 11 (1.803 m), weight 70.9 kg, SpO2 100%. CVP:  [4 mmHg-6 mmHg] 4 mmHg  Vent Mode: PRVC FiO2 (%):  [40 %] 40 % Set Rate:   [20 bmp] 20 bmp Vt Set:  [600 mL] 600 mL PEEP:  [5 cmH20] 5 cmH20 Plateau Pressure:  [19 cmH20-21 cmH20] 20 cmH20   Intake/Output Summary (Last 24 hours) at 09/10/2024 1017 Last data filed at 09/10/2024 0946 Gross per 24 hour  Intake 2038.57 ml  Output 465 ml  Net 1573.57 ml   Filed Weights   09/08/24 1200 09/09/24 0500 09/10/24 0407  Weight: 67 kg 67 kg 70.9 kg    Examination: General: Intubated, sedated. Normal body habitus. Lungs: Coarse mechanical breath sounds bilaterally and equally. Cardiovascular: Regular rate and rhythm, no murmurs. Abdomen: Soft, NTND. Extremities: Non-edematous. Extremities: Non edematous. Neuro: performed while under deep sedation with propofol  at 80 mcg/kg/min -- Pupils pinpoint + sluggish light response. No cough on in-line suctioning. No corneal reflex.   Assessment and Plan   # S/p cardiac arrest # Myoclonus # Acute hypoxemic respiratory failure # Metabolic acidosis, non-gap # AKI # Early LV thrombus # Acute systolic heart failure (post-arrest)  NEURO: S/p cardiac arrest. Clinical examination with several concerning features including but not limited to myoclonus. Await MRI at ~72 hrs for next prognostication point. Continue cvEEG while weaning propofol  by 10 mcg/kg/min each hour over the course of today -- stop weaning if myoclonus recrudesces. Continue Keppra  and valproate. Once MRI is performed and interpreted, need to set up a meeting with Rashawn Rayman (patient's uncle and next of kin; [phone]) - from [place]  but staying locally for several days for purpose of making initial decisions once  data available. I informed him this will probably end up taking place either Monday or Tuesday.  COR: Presumably new reduction in LVEF to 25-30% + moderate RV dysfunction. LV apical sludge felt to be developing LV thrombus -> heparin  drip. Repeat echo in future to re-evaluate pending clinical course and prognostication.  PULM: Continue  mechanical ventilation. Suspect aspiration pneumonia given fever beneath [facility]. Continue ceftriaxone  for 7 day course. F/u culture data.  RENAL: Improving AKI in post-arrest setting. Replete potassium.  GI: Nutrition consult for tube feeds.  TOX: EtOH 158 on admission. MV/Thiamine . Utox negative.   DVT PPX: Heparin  drip. GI PPX: Famotidine . Code Status: DNR/Do Not Re-intubate  Labs   CBC: Recent Labs  Lab 09/08/24 1044 09/08/24 1049 09/09/24 0425 09/10/24 0305  WBC  --  1.9* 10.5 8.5  HGB 10.9* 10.9* 10.3* 9.8*  HCT 32.0* 32.5* 28.1* 27.4*  MCV  --  100.9* 94.3 95.8  PLT  --  147* 161 147*    Basic Metabolic Panel: Recent Labs  Lab 09/08/24 1049 09/08/24 2105 09/09/24 0425 09/09/24 1700 09/10/24 0305  NA 133* 133* 132* 135 133*  K 3.6 2.7* 3.2* 3.9 3.4*  CL 96* 95* 97* 102 98  CO2 15* 15* 16* 17* 19*  GLUCOSE 144* 134* 151* 126* 213*  BUN 9 12 13 10 10   CREATININE 1.14 1.19 1.53* 1.32* 1.10  CALCIUM  8.4* 8.9 8.6* 8.6* 8.5*  MG 1.6* 2.3 2.1  --   --   PHOS 6.8* 3.0  --   --   --    GFR: Estimated Creatinine Clearance: 74.3 mL/min (by C-G formula based on SCr of 1.1 mg/dL). Recent Labs  Lab 09/08/24 1049 09/08/24 1050 09/08/24 2107 09/09/24 0425 09/10/24 0305  WBC 1.9*  --   --  10.5 8.5  LATICACIDVEN  --  10.5* 1.3  --   --     Liver Function Tests: Recent Labs  Lab 09/08/24 1049 09/09/24 0425  AST 247* 115*  ALT 67* 57*  ALKPHOS 61 54  BILITOT 0.8 1.4*  PROT 6.5 6.4*  ALBUMIN  3.1* 2.9*   No results for input(s): LIPASE, AMYLASE in the last 168 hours. No results for input(s): AMMONIA in the last 168 hours.  ABG    Component Value Date/Time   PHART 7.371 09/08/2024 1044   PCO2ART 23.1 (L) 09/08/2024 1044   PO2ART 506 (H) 09/08/2024 1044   HCO3 13.4 (L) 09/08/2024 1044   TCO2 14 (L) 09/08/2024 1044   ACIDBASEDEF 10.0 (H) 09/08/2024 1044   O2SAT 100 09/09/2024 1043     Coagulation Profile: Recent Labs  Lab 09/08/24 1049   INR 1.1    Cardiac Enzymes: No results for input(s): CKTOTAL, CKMB, CKMBINDEX, TROPONINI in the last 168 hours.  HbA1C: Hgb A1c MFr Bld  Date/Time Value Ref Range Status  09/08/2024 01:43 PM 7.8 (H) 4.8 - 5.6 % Final    Comment:    (NOTE) Diagnosis of Diabetes The following HbA1c ranges recommended by the American Diabetes Association (ADA) may be used as an aid in the diagnosis of diabetes mellitus.  Hemoglobin             Suggested A1C NGSP%              Diagnosis  <5.7                   Non Diabetic  5.7-6.4                Pre-Diabetic  >  6.4                   Diabetic  <7.0                   Glycemic control for                       adults with diabetes.    07/03/2022 07:35 PM 7.9 (H) 4.8 - 5.6 % Final    Comment:    (NOTE) Pre diabetes:          5.7%-6.4%  Diabetes:              >6.4%  Glycemic control for   <7.0% adults with diabetes     CBG: Recent Labs  Lab 09/09/24 1542 09/09/24 2013 09/09/24 2329 09/10/24 0330 09/10/24 0726  GLUCAP 124* 135* 158* 180* 243*    Current Medications  Scheduled Meds:  Chlorhexidine  Gluconate Cloth  6 each Topical Daily   docusate  100 mg Per Tube BID   famotidine   20 mg Per Tube BID   folic acid   1 mg Per Tube Daily   insulin  aspart  0-9 Units Subcutaneous Q4H   levETIRAcetam   1,000 mg Intravenous BID   multivitamin with minerals  1 tablet Per Tube Daily   nicotine   7 mg Transdermal Daily   mouth rinse  15 mL Mouth Rinse Q2H   polyethylene glycol  17 g Per Tube Daily   potassium chloride   40 mEq Per Tube Once   thiamine  (VITAMIN B1) injection  100 mg Intravenous Daily   Continuous Infusions:  cefTRIAXone  (ROCEPHIN )  IV Stopped (09/09/24 1347)   heparin  1,100 Units/hr (09/10/24 0946)   norepinephrine  (LEVOPHED ) Adult infusion Stopped (09/10/24 0921)   propofol  (DIPRIVAN ) infusion 60 mcg/kg/min (09/10/24 1014)   PRN Meds:.acetaminophen , docusate, fentaNYL , mouth rinse, polyethylene  glycol  Critical care time: 45 minutes (excludes time spent performing any procedures)

## 2024-09-10 NOTE — Progress Notes (Signed)
 [facility] ADULT ICU REPLACEMENT PROTOCOL   The patient does apply for the [facility] Adult ICU Electrolyte Replacment Protocol based on the criteria listed below:   1.Exclusion criteria: TCTS, ECMO, Dialysis, and Myasthenia Gravis patients 2. Is GFR >/= 30 ml/min? Yes.    Patient's GFR today is >60 3. Is SCr </= 2? Yes.   Patient's SCr is 1.1 mg/dL 4. Did SCr increase >/= 0.5 in 24 hours? No. 5.Pt's weight >40kg  Yes.   6. Abnormal electrolyte(s): k 3.4  7. Electrolytes replaced per protocol 8.  Call MD STAT for K+ </= 2.5, Phos </= 1, or Mag </= 1 Physician:    Belina Mandile A 09/10/2024 4:17 AM

## 2024-09-10 NOTE — Progress Notes (Signed)
 Brief Nutrition Note  Consult received for enteral/tube feeding initiation and management.  Adult Enteral Nutrition Protocol initiated. Full assessment to follow.  OG tube in place with tip located in stomach per xray imaging.   Admitting Dx: Cardiac arrest (HCC) [I46.9]  Body mass index is 21.8 kg/m. Pt meets criteria for normal based on current BMI.  Labs:  Recent Labs  Lab 09/08/24 1049 09/08/24 2105 09/09/24 0425 09/09/24 1700 09/10/24 0305  NA 133* 133* 132* 135 133*  K 3.6 2.7* 3.2* 3.9 3.4*  CL 96* 95* 97* 102 98  CO2 15* 15* 16* 17* 19*  BUN 9 12 13 10 10   CREATININE 1.14 1.19 1.53* 1.32* 1.10  CALCIUM  8.4* 8.9 8.6* 8.6* 8.5*  MG 1.6* 2.3 2.1  --   --   PHOS 6.8* 3.0  --   --   --   GLUCOSE 144* 134* 151* 126* 213*   Would recommend continuing to monitor electrolyte labs following administration of tube feeds given pt exhibiting signs of refeeding. Continue thiamine  100 mg daily.     Josette Glance, MS, RDN, LDN Clinical Dietitian I Please reach out via secure chat

## 2024-09-11 ENCOUNTER — Inpatient Hospital Stay (HOSPITAL_COMMUNITY)

## 2024-09-11 ENCOUNTER — Encounter (HOSPITAL_COMMUNITY): Payer: Self-pay

## 2024-09-11 DIAGNOSIS — G931 Anoxic brain damage, not elsewhere classified: Secondary | ICD-10-CM | POA: Diagnosis not present

## 2024-09-11 DIAGNOSIS — R569 Unspecified convulsions: Secondary | ICD-10-CM | POA: Diagnosis not present

## 2024-09-11 DIAGNOSIS — I469 Cardiac arrest, cause unspecified: Secondary | ICD-10-CM | POA: Diagnosis not present

## 2024-09-11 LAB — BASIC METABOLIC PANEL WITH GFR
Anion gap: 18 — ABNORMAL HIGH (ref 5–15)
BUN: 12 mg/dL (ref 6–20)
CO2: 21 mmol/L — ABNORMAL LOW (ref 22–32)
Calcium: 8.9 mg/dL (ref 8.9–10.3)
Chloride: 97 mmol/L — ABNORMAL LOW (ref 98–111)
Creatinine, Ser: 0.94 mg/dL (ref 0.61–1.24)
GFR, Estimated: >60 mL/min (ref >60)
Glucose, Bld: 115 mg/dL — ABNORMAL HIGH (ref 70–99)
Potassium: 3.3 mmol/L — ABNORMAL LOW (ref 3.5–5.1)
Sodium: 136 mmol/L (ref 135–145)

## 2024-09-11 LAB — PHOSPHORUS: Phosphorus: 2.3 mg/dL — ABNORMAL LOW (ref 2.5–4.6)

## 2024-09-11 LAB — GLUCOSE, CAPILLARY
Glucose-Capillary: 120 mg/dL — ABNORMAL HIGH (ref 70–99)
Glucose-Capillary: 135 mg/dL — ABNORMAL HIGH (ref 70–99)
Glucose-Capillary: 192 mg/dL — ABNORMAL HIGH (ref 70–99)
Glucose-Capillary: 191 mg/dL — ABNORMAL HIGH (ref 70–99)
Glucose-Capillary: 165 mg/dL — ABNORMAL HIGH (ref 70–99)
Glucose-Capillary: 157 mg/dL — ABNORMAL HIGH (ref 70–99)

## 2024-09-11 LAB — CBC
HCT: 31.1 % — ABNORMAL LOW (ref 39.0–52.0)
Hemoglobin: 11 g/dL — ABNORMAL LOW (ref 13.0–17.0)
MCH: 34.1 pg — ABNORMAL HIGH (ref 26.0–34.0)
MCHC: 35.4 g/dL (ref 30.0–36.0)
MCV: 96.3 fL (ref 80.0–100.0)
Platelets: 134 K/uL — ABNORMAL LOW (ref 150–400)
RBC: 3.23 MIL/uL — ABNORMAL LOW (ref 4.22–5.81)
RDW: 12.9 % (ref 11.5–15.5)
WBC: 6 K/uL (ref 4.0–10.5)
nRBC: 0 % (ref 0.0–0.2)

## 2024-09-11 LAB — MAGNESIUM: Magnesium: 1.8 mg/dL (ref 1.7–2.4)

## 2024-09-11 LAB — HEPARIN LEVEL (UNFRACTIONATED): Heparin Unfractionated: 0.43 [IU]/mL (ref 0.30–0.70)

## 2024-09-11 MED ORDER — POTASSIUM & SODIUM PHOSPHATES 280-160-250 MG PO PACK
2.0000 | PACK | ORAL | Status: AC
  Administered 2024-09-11 (×3): 2
  Filled 2024-09-11 (×3): qty 2

## 2024-09-11 MED ORDER — MAGNESIUM SULFATE 2 GM/50ML IV SOLN
2.0000 g | Freq: Once | INTRAVENOUS | Status: AC
  Administered 2024-09-11: 2 g via INTRAVENOUS
  Filled 2024-09-11: qty 50

## 2024-09-11 MED ORDER — POTASSIUM CHLORIDE 20 MEQ PO PACK
20.0000 meq | PACK | Freq: Once | ORAL | Status: AC
  Administered 2024-09-11: 20 meq
  Filled 2024-09-11: qty 1

## 2024-09-11 NOTE — Progress Notes (Signed)
 NAME:  [name], MRN:  [id number], DOB:  [birth year], LOS: 3 ADMISSION DATE:  09/08/2024, CONSULTATION DATE:  09/08/2024 CHIEF COMPLAINT:  Cardiac arrest (OOH)  Summary  [name] is a 58 year old male with past medical history significant for multivessel CAD, HTN, HLD, type 2 DM who presented via EMS following cardiac arrest. Patient was found down in gas station bathroom for approx 33 minutes. ROSC achieved after 18 minutes of CPR, 1 Epi, 50 mcg Fentanyl , 500ml NS. Patient arrived via EMS intubated. In ED, patient received Narcan  with no improvement in neurological status, an arterial line was placed for hypotension, and patient was started on vasopressors. PCCM consulted for ICU evaluation post-cardiac arrest. Found patient to have frequent myoclonus, started propofol  and loaded with Keppra  and valproate.  Pertinent  Medical History   Past Medical History:  Diagnosis Date   Acid reflux    Concussion    Coronary artery disease    Diabetes (HCC)    ETOH abuse    High cholesterol    Hypertension    MI (myocardial infarction) (HCC)    2012   Sleep apnea    USES CPAP AS NEEDED   Tobacco abuse     Significant Hospital Events: Including procedures, antibiotic start and stop dates in addition to other pertinent events   11/20: Cardiac arrest, intubation, myoclonus observed, propofol  + Keppra  + valproate loaded. 11/21: Rhythmic bilateral foot movements observed. 11/22: Start propofol  wean while assessing for return of myoclonus. 11/23: Awaiting MRI.  Interim History / Subjective:  Had recurrent myoclonus on weaning of propofol . Currently back on max rate propofol  without myoclonus.  Objective    Blood pressure 108/78, pulse 70, temperature 97.9 F (36.6 C), resp. rate 19, height 5' 11 (1.803 m), weight 70.5 kg, SpO2 100%.    Vent Mode: PRVC FiO2 (%):  [40 %] 40 % Set Rate:  [20 bmp] 20 bmp Vt Set:  [600 mL] 600 mL PEEP:  [5 cmH20] 5 cmH20 Plateau Pressure:  [16  cmH20-21 cmH20] 16 cmH20   Intake/Output Summary (Last 24 hours) at 09/11/2024 2310 Last data filed at 09/11/2024 1700 Gross per 24 hour  Intake 1097.22 ml  Output 245 ml  Net 852.22 ml   Filed Weights   09/09/24 0500 09/10/24 0407 09/11/24 0308  Weight: 67 kg 70.9 kg 70.5 kg    Examination: General: Intubated, sedated. Normal body habitus. Lungs: Coarse mechanical breath sounds bilaterally and equally. Cardiovascular: Regular rate and rhythm, no murmurs. Abdomen: Soft, NTND. Extremities: Non-edematous. Neuro: performed while under deep sedation with propofol  at 80 mcg/kg/min -- Pupils pinpoint + sluggish light response. No cough on in-line suctioning. No corneal reflex.   Assessment and Plan   # S/p cardiac arrest # Myoclonus # Acute hypoxemic respiratory failure # Metabolic acidosis, non-gap # Early LV thrombus # Acute systolic heart failure (post-arrest)  NEURO: S/p cardiac arrest. Clinical examination with several concerning features including but not limited to myoclonus. Await MRI at ~72 hrs for next prognostication point. Continue cvEEG while weaning propofol  by 10 mcg/kg/min each hour over the course of today -- stop weaning if myoclonus recrudesces. Continue Keppra  and valproate. Once MRI is performed and interpreted, need to set up a meeting with critical care, neurology and Allante Whitmire (patient's uncle and next of kin; [phone]) present. His uncle is from [place]  but staying locally for several days for purpose of making initial decisions once data is available. I informed him this will probably end up taking place  either Monday or Tuesday.  COR: Presumably new reduction in LVEF to 25-30% + moderate RV dysfunction. LV apical sludge felt to be developing LV thrombus -> heparin  drip. Repeat echo in future to re-evaluate pending clinical course and prognostication.  PULM: Continue mechanical ventilation. Possible aspiration pneumonia (had fever beneath [facility] Continue ceftriaxone  for 7 day course. F/u culture data.  RENAL: AKI resolved.  GI: Tube feeds.  TOX: EtOH 158 on admission. MV/Thiamine . Utox negative.   DVT PPX: Heparin  drip. GI PPX: Famotidine . Code Status: DNR/Do Not Re-intubate  Labs   CBC: Recent Labs  Lab 09/08/24 1044 09/08/24 1049 09/09/24 0425 09/10/24 0305 09/11/24 0309  WBC  --  1.9* 10.5 8.5 6.0  HGB 10.9* 10.9* 10.3* 9.8* 11.0*  HCT 32.0* 32.5* 28.1* 27.4* 31.1*  MCV  --  100.9* 94.3 95.8 96.3  PLT  --  147* 161 147* 134*    Basic Metabolic Panel: Recent Labs  Lab 09/08/24 1049 09/08/24 2105 09/09/24 0425 09/09/24 1700 09/10/24 0305 09/11/24 0309  NA 133* 133* 132* 135 133* 136  K 3.6 2.7* 3.2* 3.9 3.4* 3.3*  CL 96* 95* 97* 102 98 97*  CO2 15* 15* 16* 17* 19* 21*  GLUCOSE 144* 134* 151* 126* 213* 115*  BUN 9 12 13 10 10 12   CREATININE 1.14 1.19 1.53* 1.32* 1.10 0.94  CALCIUM  8.4* 8.9 8.6* 8.6* 8.5* 8.9  MG 1.6* 2.3 2.1  --  1.9 1.8  PHOS 6.8* 3.0  --   --  2.1* 2.3*   GFR: Estimated Creatinine Clearance: 86.5 mL/min (by C-G formula based on SCr of 0.94 mg/dL). Recent Labs  Lab 09/08/24 1049 09/08/24 1050 09/08/24 2107 09/09/24 0425 09/10/24 0305 09/11/24 0309  WBC 1.9*  --   --  10.5 8.5 6.0  LATICACIDVEN  --  10.5* 1.3  --   --   --     Liver Function Tests: Recent Labs  Lab 09/08/24 1049 09/09/24 0425  AST 247* 115*  ALT 67* 57*  ALKPHOS 61 54  BILITOT 0.8 1.4*  PROT 6.5 6.4*  ALBUMIN  3.1* 2.9*   No results for input(s): LIPASE, AMYLASE in the last 168 hours. No results for input(s): AMMONIA in the last 168 hours.  ABG    Component Value Date/Time   PHART 7.371 09/08/2024 1044   PCO2ART 23.1 (L) 09/08/2024 1044   PO2ART 506 (H) 09/08/2024 1044   HCO3 13.4 (L) 09/08/2024 1044   TCO2 14 (L) 09/08/2024 1044   ACIDBASEDEF 10.0 (H) 09/08/2024 1044   O2SAT 100 09/09/2024 1043     Coagulation Profile: Recent Labs  Lab 09/08/24 1049  INR 1.1     Cardiac Enzymes: No results for input(s): CKTOTAL, CKMB, CKMBINDEX, TROPONINI in the last 168 hours.  HbA1C: Hgb A1c MFr Bld  Date/Time Value Ref Range Status  09/08/2024 01:43 PM 7.8 (H) 4.8 - 5.6 % Final    Comment:    (NOTE) Diagnosis of Diabetes The following HbA1c ranges recommended by the American Diabetes Association (ADA) may be used as an aid in the diagnosis of diabetes mellitus.  Hemoglobin             Suggested A1C NGSP%              Diagnosis  <5.7                   Non Diabetic  5.7-6.4  Pre-Diabetic  >6.4                   Diabetic  <7.0                   Glycemic control for                       adults with diabetes.    07/03/2022 07:35 PM 7.9 (H) 4.8 - 5.6 % Final    Comment:    (NOTE) Pre diabetes:          5.7%-6.4%  Diabetes:              >6.4%  Glycemic control for   <7.0% adults with diabetes     CBG: Recent Labs  Lab 09/11/24 0304 09/11/24 0751 09/11/24 1142 09/11/24 1546 09/11/24 1928  GLUCAP 120* 135* 192* 191* 165*    Current Medications  Scheduled Meds:  acetaminophen   650 mg Per Tube Q4H   Chlorhexidine  Gluconate Cloth  6 each Topical Daily   docusate  100 mg Per Tube BID   famotidine   20 mg Per Tube BID   feeding supplement (PROSource TF20)  60 mL Per Tube Daily   feeding supplement (VITAL HIGH PROTEIN)  1,000 mL Per Tube Q24H   folic acid   1 mg Per Tube Daily   insulin  aspart  0-9 Units Subcutaneous Q4H   levETIRAcetam   1,000 mg Intravenous BID   multivitamin with minerals  1 tablet Per Tube Daily   nicotine   7 mg Transdermal Daily   mouth rinse  15 mL Mouth Rinse Q2H   polyethylene glycol  17 g Per Tube Daily   thiamine  (VITAMIN B1) injection  100 mg Intravenous Daily   Continuous Infusions:  cefTRIAXone  (ROCEPHIN )  IV Stopped (09/11/24 1236)   heparin  1,100 Units/hr (09/11/24 1824)   norepinephrine  (LEVOPHED ) Adult infusion 2 mcg/min (09/11/24 1700)   propofol  (DIPRIVAN ) infusion 80  mcg/kg/min (09/11/24 2215)   PRN Meds:.docusate, fentaNYL , mouth rinse, polyethylene glycol  Critical care time: 32 minutes (excludes time spent performing any procedures)

## 2024-09-11 NOTE — Progress Notes (Signed)
 NEUROLOGY CONSULT FOLLOW UP NOTE   Date of service: September 11, 2024 Patient Name: [name] MRN:  [id number] DOB:  1966/02/04  Interval Hx/subjective   Patient had myoclonic jerking overnight while weaning propofol , now @ 80.  MRI pending for today.   Vitals   Vitals:   09/11/24 1006 09/11/24 1100 09/11/24 1132 09/11/24 1150  BP:      Pulse: 67 60    Resp: 18 18    Temp: (!) 94.5 F (34.7 C) (!) 95.7 F (35.4 C)    TempSrc: Esophageal   Esophageal  SpO2: 100% 100% 100%   Weight:      Height:         Body mass index is 21.68 kg/m.  Physical Exam  Gen: Sedated, intubated HEENT: [place] AT CVS: RRR Chest: vented  NEUROLOGICAL EXAM Sedated, intubated Sedation held for exam No spontaneous movement noted No movement to nox stim Pupils pin point with extremely sluggish response. Corneals absent bilaterally Patient has a constant grimace with wrinkled forehead Negative cough and gag Minimal left foot rhythmic movement with stimulus   Medications  Current Facility-Administered Medications:    acetaminophen  (TYLENOL ) tablet 650 mg, 650 mg, Per Tube, Q4H, Stretch, Lamar PARAS, MD, 650 mg at 09/11/24 1202   cefTRIAXone  (ROCEPHIN ) 2 g in sodium chloride  0.9 % 100 mL IVPB, 2 g, Intravenous, Q24H, Payne, John D, PA-C, Last Rate: 200 mL/hr at 09/11/24 1206, 2 g at 09/11/24 1206   Chlorhexidine  Gluconate Cloth 2 % PADS 6 each, 6 each, Topical, Daily, Garrick Lamar, MD, 6 each at 09/10/24 2335   docusate (COLACE) 50 MG/5ML liquid 100 mg, 100 mg, Per Tube, BID PRN, Stretch, Lamar PARAS, MD   docusate (COLACE) 50 MG/5ML liquid 100 mg, 100 mg, Per Tube, BID, Payne, John D, PA-C, 100 mg at 09/11/24 1102   famotidine  (PEPCID ) tablet 20 mg, 20 mg, Per Tube, BID, Payne, John D, PA-C, 20 mg at 09/11/24 1103   feeding supplement (PROSource TF20) liquid 60 mL, 60 mL, Per Tube, Daily, Stretch, Lamar PARAS, MD, 60 mL at 09/11/24 1102   feeding supplement (VITAL HIGH PROTEIN) liquid 1,000  mL, 1,000 mL, Per Tube, Q24H, Stretch, Lamar PARAS, MD, 1,000 mL at 09/11/24 1114   fentaNYL  (SUBLIMAZE ) bolus via infusion 25-100 mcg, 25-100 mcg, Intravenous, Q15 min PRN, Garrick Lamar, MD, 50 mcg at 09/08/24 1108   folic acid  (FOLVITE ) tablet 1 mg, 1 mg, Per Tube, Daily, Payne, John D, PA-C, 1 mg at 09/11/24 1103   heparin  ADULT infusion 100 units/mL (25000 units/250mL), 1,100 Units/hr, Intravenous, Continuous, Stretch, Lamar PARAS, MD, Last Rate: 11 mL/hr at 09/11/24 0600, 1,100 Units/hr at 09/11/24 0600   insulin  aspart (novoLOG ) injection 0-9 Units, 0-9 Units, Subcutaneous, Q4H, Payne, John D, PA-C, 2 Units at 09/11/24 1201   levETIRAcetam  (KEPPRA ) undiluted injection 1,000 mg, 1,000 mg, Intravenous, BID, Khaliqdina, Salman, MD, 1,000 mg at 09/11/24 1102   multivitamin with minerals tablet 1 tablet, 1 tablet, Per Tube, Daily, Emilio Norleen BIRCH, PA-C, 1 tablet at 09/11/24 1102   nicotine  (NICODERM CQ  - dosed in mg/24 hr) patch 7 mg, 7 mg, Transdermal, Daily, Emilio, John D, PA-C, 7 mg at 09/11/24 1202   norepinephrine  (LEVOPHED ) 4mg  in (0.016 mg/mL) premix infusion, 0-40 mcg/min, Intravenous, Titrated, Emilio Norleen D, PA-C, Last Rate: 7.5 mL/hr at 09/11/24 0951, 2 mcg/min at 09/11/24 0951   Oral care mouth rinse, 15 mL, Mouth Rinse, Q2H, Stretch, Lamar PARAS, MD, 15 mL at 09/11/24 1000  Oral care mouth rinse, 15 mL, Mouth Rinse, PRN, Stretch, Lamar PARAS, MD   polyethylene glycol (MIRALAX  / GLYCOLAX ) packet 17 g, 17 g, Per Tube, Daily PRN, Emilio Rush D, PA-C   polyethylene glycol (MIRALAX  / GLYCOLAX ) packet 17 g, 17 g, Per Tube, Daily, Emilio Rush BIRCH, PA-C, 17 g at 09/11/24 1103   potassium & sodium phosphates  (PHOS-NAK) 280-160-250 MG packet 2 packet, 2 packet, Per Tube, TID WC & HS, Stretch, Lamar PARAS, MD, 2 packet at 09/11/24 9187   propofol  (DIPRIVAN ) 1000 MG/100ML infusion, 80 mcg/kg/min, Intravenous, Titrated, Khaliqdina, Salman, MD, Last Rate: 32.2 mL/hr at 09/11/24 1059, 80 mcg/kg/min at 09/11/24  1059   thiamine  (VITAMIN B1) injection 100 mg, 100 mg, Intravenous, Daily, Emilio Rush BIRCH, PA-C, 100 mg at 09/11/24 1102  Labs and Diagnostic Imaging   CBC:  Recent Labs  Lab 09/10/24 0305 09/11/24 0309  WBC 8.5 6.0  HGB 9.8* 11.0*  HCT 27.4* 31.1*  MCV 95.8 96.3  PLT 147* 134*    Basic Metabolic Panel:  Lab Results  Component Value Date   NA 136 09/11/2024   K 3.3 (L) 09/11/2024   CO2 21 (L) 09/11/2024   GLUCOSE 115 (H) 09/11/2024   BUN 12 09/11/2024   CREATININE 0.94 09/11/2024   CALCIUM  8.9 09/11/2024   GFRNONAA >60 09/11/2024   GFRAA >60 04/07/2020   Lipid Panel:  Lab Results  Component Value Date   LDLCALC 147 (H) 10/07/2022   HgbA1c:  Lab Results  Component Value Date   HGBA1C 7.8 (H) 09/08/2024   Urine Drug Screen:     Component Value Date/Time   LABOPIA NONE DETECTED 09/08/2024 1235   COCAINSCRNUR NONE DETECTED 09/08/2024 1235   LABBENZ NONE DETECTED 09/08/2024 1235   AMPHETMU NONE DETECTED 09/08/2024 1235   THCU NONE DETECTED 09/08/2024 1235   LABBARB NONE DETECTED 09/08/2024 1235    Alcohol Level     Component Value Date/Time   ETH 158 (H) 09/08/2024 1051   INR  Lab Results  Component Value Date   INR 1.1 09/08/2024   APTT  Lab Results  Component Value Date   APTT 27 09/08/2024   AED levels: No results found for: PHENYTOIN, ZONISAMIDE, LAMOTRIGINE, LEVETIRACETA  CT Head without contrast(Personally reviewed): Radiology read concerning for diffused WM GM loss. My read, I agree with Dr. Vanessa, do not feel there is concerning edema.  LTM EEG 11/21-11/22 - burst suppression  Assessment   [name] is a 58 y.o. male PMH CAD, DM2, MI, OSA, toabacco use, presented after OOH cardiac arrest. Downtime not very clear but close to total 38 min  Prior to ROSC (20 min down, 18 min CPR). Myoclonic jerks seen on presentation. He was loaded with Keppra  and neurology was consulted.   On exam has some L foot stimulus-induced  myoclonus. In burst suppression on propofol .  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 Will defer formal prognostication for 72hours post-event with imaging.   Recommendations  Continue with current AED regimen Continue LTM Gently wean propofol  as tolerated MRI at 72 hours  We will continue to follow along   _______________________________________________________________  Pt seen by Neuro NP/APP and later by MD. Note/plan to be edited by MD as needed.    Rocky JAYSON Likes, DNP [facility] Please use AMION for contact information & EPIC for messaging.   Attending Neurohospitalist Addendum Patient seen and examined with APP/Resident. Agree with the history and physical as documented above. Agree  with the plan as documented, which I helped formulate. I have edited the note above to reflect my full findings and recommendations. I have independently reviewed the chart, obtained history, review of systems and examined the patient.I have personally reviewed pertinent head/neck/spine imaging (CT/MRI). Please feel free to call with any questions.  MRI brain tonight. LTM d/c'd. Will f/u tomorrow and coordinate with CCM to set up a meeting with Carlin Louder patient's uncle and next of kin for further GOC discussions.  This patient is critically ill and at significant risk of neurological worsening, death and care requires constant monitoring of vital signs, hemodynamics,respiratory and cardiac monitoring, neurological assessment, discussion with family, other specialists and medical decision making of high complexity. I spent 45 minutes of neurocritical care time  in the care of  this patient. This was time spent independent of any time provided by nurse practitioner or PA.  Elida Ross, MD [facility] [phone]  If 7pm- 7am, please page neurology on call as listed in AMION.

## 2024-09-11 NOTE — Progress Notes (Signed)
 PHARMACY - ANTICOAGULATION CONSULT NOTE  Pharmacy Consult for Heparin   Indication: Apical thrombus  No Known Allergies  Patient Measurements: Height: 5' 11 (180.3 cm) Weight: 70.5 kg (155 lb 6.8 oz) IBW/kg (Calculated) : 75.3  Vital Signs: Temp: 98.8 F (37.1 C) (11/23 0706) Temp Source: Esophageal (11/23 0706) BP: 98/74 (11/23 0400) Pulse Rate: 80 (11/23 0706)  Labs: Recent Labs    09/08/24 1049 09/08/24 1343 09/08/24 1806 09/08/24 2105 09/09/24 0425 09/09/24 1043 09/09/24 1700 09/10/24 0305 09/11/24 0309  HGB 10.9*  --   --   --  10.3*  --   --  9.8* 11.0*  HCT 32.5*  --   --   --  28.1*  --   --  27.4* 31.1*  PLT 147*  --   --   --  161  --   --  147* 134*  APTT 27  --   --   --   --   --   --   --   --   LABPROT 14.7  --   --   --   --   --   --   --   --   INR 1.1  --   --   --   --   --   --   --   --   HEPARINUNFRC  --   --   --    < >  --  0.51  --  0.33 0.43  CREATININE 1.14  --   --    < > 1.53*  --  1.32* 1.10 0.94  TROPONINIHS 49* 125* 174*  --   --   --   --   --   --    < > = values in this interval not displayed.    Estimated Creatinine Clearance: 86.5 mL/min (by C-G formula based on SCr of 0.94 mg/dL).   Assessment: [name] is a 58 y.o. year old male admitted on 09/08/2024 with concern for apical thrombus. No anticoagulation prior to admission. Pharmacy consulted to dose heparin .  Heparin  level 0.43, therapeutic  No overt s/sx of bleeding noted. No issues with infusion.   Goal of Therapy:  Heparin  level 0.3-0.7 units/ml Monitor platelets by anticoagulation protocol: Yes   Plan:  Continue heparin  infusion to 1100 units/hr Daily heparin  level, CBC, and monitoring for bleeding F/u plans for anticoagulation and GOC  Thank you for allowing pharmacy to participate in this patient's care.  Leonor GORMAN Bash, PharmD Emergency Medicine Clinical Pharmacist 09/11/2024,7:36 AM

## 2024-09-11 NOTE — Progress Notes (Signed)
 Pt transported from 2M11 to MRI and back without complications.

## 2024-09-11 NOTE — Progress Notes (Signed)
 Actic sun malfunctioning, allowing pat. Temp to drop below target. Troubleshooting included changing esophageal probe, cable, comparing foley probe temps. Arctic son exchanged and Dr. Olena notified. Arriving to bedside.

## 2024-09-11 NOTE — Progress Notes (Signed)
 eLink Physician-Brief Progress Note Patient Name: [name] DOB: [birth year] MRN: [id number]   Date of Service  09/11/2024  HPI/Events of Note  Concerned about facial twitching.  Neurology is following, no evidence of seizures, maxed on propofol , no continuous fentanyl  ordered.  Scheduled for MRI tonight  eICU Interventions  No indication for changes to sedation.  Continue with MRI as scheduled     Intervention Category Minor Interventions: Agitation / anxiety - evaluation and management  Arney Mayabb 09/11/2024, 8:15 PM

## 2024-09-11 NOTE — Progress Notes (Signed)
 LTM EEG D/C'd. No noted skin break down. [facility] notified.

## 2024-09-11 NOTE — Procedures (Signed)
 Patient Name: [name]  MRN: [id number]  Epilepsy Attending: Arlin MALVA Krebs  Referring Physician/Provider: Khaliqdina, Salman, MD Duration: 09/10/2024 9175 to 09/11/2024 9175   Patient history: 58yo M sp cardiac arrest. EEG to evaluate for seizure   Level of alertness: comatose   AEDs during EEG study: Propofol , LEV   Technical aspects: This EEG study was done with scalp electrodes positioned according to the 10-20 International system of electrode placement. Electrical activity was reviewed with band pass filter of 1-70Hz , sensitivity of 7 uV/mm, display speed of 69mm/sec with a 60Hz  notched filter applied as appropriate. EEG data were recorded continuously and digitally stored.  Video monitoring was available and reviewed as appropriate.   Description: EEG showed continuous generalized background suppression. Hyperventilation and photic stimulation were not performed.      ABNORMALITY - Background suppression, generalized   IMPRESSION: This study is suggestive of profound diffuse encephalopathy. No seizures or epileptiform discharges were seen throughout the recording.   Glayds Insco O Yesenia Locurto

## 2024-09-12 ENCOUNTER — Inpatient Hospital Stay (HOSPITAL_COMMUNITY)

## 2024-09-12 DIAGNOSIS — R569 Unspecified convulsions: Secondary | ICD-10-CM | POA: Diagnosis not present

## 2024-09-12 DIAGNOSIS — E8721 Acute metabolic acidosis: Secondary | ICD-10-CM

## 2024-09-12 DIAGNOSIS — I5021 Acute systolic (congestive) heart failure: Secondary | ICD-10-CM

## 2024-09-12 LAB — GLUCOSE, CAPILLARY
Glucose-Capillary: 155 mg/dL — ABNORMAL HIGH (ref 70–99)
Glucose-Capillary: 172 mg/dL — ABNORMAL HIGH (ref 70–99)
Glucose-Capillary: 179 mg/dL — ABNORMAL HIGH (ref 70–99)
Glucose-Capillary: 204 mg/dL — ABNORMAL HIGH (ref 70–99)
Glucose-Capillary: 206 mg/dL — ABNORMAL HIGH (ref 70–99)
Glucose-Capillary: 210 mg/dL — ABNORMAL HIGH (ref 70–99)

## 2024-09-12 LAB — CBC
HCT: 28.8 % — ABNORMAL LOW (ref 39.0–52.0)
HCT: 27.9 % — ABNORMAL LOW (ref 39.0–52.0)
Hemoglobin: 10.5 g/dL — ABNORMAL LOW (ref 13.0–17.0)
Hemoglobin: 10.1 g/dL — ABNORMAL LOW (ref 13.0–17.0)
MCH: 34.5 pg — ABNORMAL HIGH (ref 26.0–34.0)
MCH: 34.4 pg — ABNORMAL HIGH (ref 26.0–34.0)
MCHC: 36.5 g/dL — ABNORMAL HIGH (ref 30.0–36.0)
MCHC: 36.2 g/dL — ABNORMAL HIGH (ref 30.0–36.0)
MCV: 94.7 fL (ref 80.0–100.0)
MCV: 94.9 fL (ref 80.0–100.0)
Platelets: 159 K/uL (ref 150–400)
Platelets: 175 K/uL (ref 150–400)
RBC: 3.04 MIL/uL — ABNORMAL LOW (ref 4.22–5.81)
RBC: 2.94 MIL/uL — ABNORMAL LOW (ref 4.22–5.81)
RDW: 13.2 % (ref 11.5–15.5)
RDW: 13.2 % (ref 11.5–15.5)
WBC: 6.3 K/uL (ref 4.0–10.5)
WBC: 6.4 K/uL (ref 4.0–10.5)
nRBC: 0 % (ref 0.0–0.2)
nRBC: 0 % (ref 0.0–0.2)

## 2024-09-12 LAB — URINALYSIS, W/ REFLEX TO CULTURE (INFECTION SUSPECTED)
Bacteria, UA: NONE SEEN
Bilirubin Urine: NEGATIVE
Glucose, UA: NEGATIVE mg/dL
Hgb urine dipstick: NEGATIVE
Ketones, ur: NEGATIVE mg/dL
Leukocytes,Ua: NEGATIVE
Nitrite: NEGATIVE
Protein, ur: NEGATIVE mg/dL
Specific Gravity, Urine: 1.027 (ref 1.005–1.030)
pH: 5 (ref 5.0–8.0)

## 2024-09-12 LAB — PROTIME-INR
INR: 1.1 (ref 0.8–1.2)
Prothrombin Time: 14.3 s (ref 11.4–15.2)

## 2024-09-12 LAB — POCT I-STAT 7, (LYTES, BLD GAS, ICA,H+H)
Acid-Base Excess: 0 mmol/L (ref 0.0–2.0)
Bicarbonate: 16.9 mmol/L — ABNORMAL LOW (ref 20.0–28.0)
Calcium, Ion: 1.11 mmol/L — ABNORMAL LOW (ref 1.15–1.40)
HCT: 43 % (ref 39.0–52.0)
Hemoglobin: 14.6 g/dL (ref 13.0–17.0)
O2 Saturation: 100 %
Potassium: 3.6 mmol/L (ref 3.5–5.1)
Sodium: 132 mmol/L — ABNORMAL LOW (ref 135–145)
TCO2: 17 mmol/L — ABNORMAL LOW (ref 22–32)
pCO2 arterial: 15.1 mmHg — CL (ref 32–48)
pH, Arterial: 7.657 (ref 7.35–7.45)
pO2, Arterial: 227 mmHg — ABNORMAL HIGH (ref 83–108)

## 2024-09-12 LAB — CULTURE, RESPIRATORY W GRAM STAIN: Culture: NORMAL

## 2024-09-12 LAB — BASIC METABOLIC PANEL WITH GFR
Anion gap: 15 (ref 5–15)
BUN: 15 mg/dL (ref 6–20)
CO2: 16 mmol/L — ABNORMAL LOW (ref 22–32)
Calcium: 8.4 mg/dL — ABNORMAL LOW (ref 8.9–10.3)
Chloride: 102 mmol/L (ref 98–111)
Creatinine, Ser: 1 mg/dL (ref 0.61–1.24)
GFR, Estimated: >60 mL/min (ref >60)
Glucose, Bld: 169 mg/dL — ABNORMAL HIGH (ref 70–99)
Potassium: 3.3 mmol/L — ABNORMAL LOW (ref 3.5–5.1)
Sodium: 133 mmol/L — ABNORMAL LOW (ref 135–145)

## 2024-09-12 LAB — FIBRINOGEN: Fibrinogen: 746 mg/dL — ABNORMAL HIGH (ref 210–475)

## 2024-09-12 LAB — APTT: aPTT: 89 s — ABNORMAL HIGH (ref 24–36)

## 2024-09-12 LAB — HEPARIN LEVEL (UNFRACTIONATED): Heparin Unfractionated: 0.46 [IU]/mL (ref 0.30–0.70)

## 2024-09-12 LAB — MAGNESIUM: Magnesium: 1.8 mg/dL (ref 1.7–2.4)

## 2024-09-12 LAB — PHOSPHORUS: Phosphorus: 5 mg/dL — ABNORMAL HIGH (ref 2.5–4.6)

## 2024-09-12 LAB — TRIGLYCERIDES: Triglycerides: 210 mg/dL — ABNORMAL HIGH (ref <150)

## 2024-09-12 MED ORDER — SODIUM CHLORIDE 0.9 % IV SOLN
12.5000 mg | Freq: Four times a day (QID) | INTRAVENOUS | Status: DC | PRN
  Administered 2024-09-14: 12.5 mg via INTRAVENOUS
  Filled 2024-09-12 (×2): qty 0.5

## 2024-09-12 MED ORDER — VITAL AF 1.2 CAL PO LIQD
1000.0000 mL | ORAL | Status: DC
  Administered 2024-09-12 – 2024-09-13 (×2): 1000 mL

## 2024-09-12 MED ORDER — POTASSIUM CHLORIDE 10 MEQ/50ML IV SOLN
10.0000 meq | INTRAVENOUS | Status: AC
  Administered 2024-09-12 (×6): 10 meq via INTRAVENOUS
  Filled 2024-09-12 (×6): qty 50

## 2024-09-12 MED ORDER — MAGNESIUM SULFATE 2 GM/50ML IV SOLN
2.0000 g | Freq: Once | INTRAVENOUS | Status: AC
  Administered 2024-09-12: 2 g via INTRAVENOUS
  Filled 2024-09-12: qty 50

## 2024-09-12 NOTE — TOC CM/SW Note (Signed)
 Transition of Care [facility]) - Inpatient Brief Assessment   Patient Details  Name: Caius Byford Schools MRN: [id number] Date of Birth: 1966/05/05  [facility] CM/SW Contact:    Lauraine FORBES Saa, LCSWA Phone Number: 09/12/2024, 3:41 PM   Clinical Narrative:  3:41 PM Per progressions, patient remains intubated with feeding tube. GOC discussions are ongoing. TOC will continue to follow.  Transition of Care Asessment: Insurance and Status: Insurance coverage has been reviewed Patient has primary care physician: Yes Home environment has been reviewed: Private Residence Prior level of function:: N/A Prior/Current Home Services: No current home services Social Drivers of Health Review: SDOH reviewed no interventions necessary (As of January 15th, 2024. Patient is currently intubated.) Readmission risk has been reviewed: Yes (Currently Yellow 17%) Transition of care needs: no transition of care needs at this time

## 2024-09-12 NOTE — Progress Notes (Signed)
 [facility] ADULT ICU REPLACEMENT PROTOCOL   The patient does apply for the [facility] Adult ICU Electrolyte Replacment Protocol based on the criteria listed below:   1.Exclusion criteria: TCTS, ECMO, Dialysis, and Myasthenia Gravis patients 2. Is GFR >/= 30 ml/min? Yes.    Patient's GFR today is >60 3. Is SCr </= 2? Yes.   Patient's SCr is 1.00 mg/dL 4. Did SCr increase >/= 0.5 in 24 hours? Yes.   5.Pt's weight >40kg  No. 6. Abnormal electrolyte(s): k 3.3, mag 1.8  7. Electrolytes replaced per protocol 8.  Call MD STAT for K+ </= 2.5, Phos </= 1, or Mag </= 1 Physician:    Mayling Aber A 09/12/2024 6:37 AM

## 2024-09-12 NOTE — Progress Notes (Signed)
 PHARMACY - ANTICOAGULATION CONSULT NOTE  Pharmacy Consult for Heparin   Indication: Apical thrombus  No Known Allergies  Patient Measurements: Height: 5' 11 (180.3 cm) Weight: 70.5 kg (155 lb 6.8 oz) IBW/kg (Calculated) : 75.3  Vital Signs: Temp: 97.7 F (36.5 C) (11/24 0700) Temp Source: Esophageal (11/24 0600) BP: 100/77 (11/24 0000) Pulse Rate: 77 (11/24 0700)  Labs: Recent Labs    09/10/24 0305 09/11/24 0309 09/12/24 0537  HGB 9.8* 11.0* 10.5*  HCT 27.4* 31.1* 28.8*  PLT 147* 134* 159  HEPARINUNFRC 0.33 0.43 0.46  CREATININE 1.10 0.94 1.00    Estimated Creatinine Clearance: 81.3 mL/min (by C-G formula based on SCr of 1 mg/dL).   Assessment: [name] is a 58 y.o. year old male admitted on 09/08/2024 with concern for apical thrombus. No anticoagulation prior to admission. Pharmacy consulted to dose heparin .  Heparin  level 0.46, therapeutic  No overt s/sx of bleeding noted. No issues with infusion.   Goal of Therapy:  Heparin  level 0.3-0.7 units/ml Monitor platelets by anticoagulation protocol: Yes   Plan:  Continue heparin  infusion to 1100 units/hr Daily heparin  level, CBC, and monitoring for bleeding F/u plans for anticoagulation and GOC  Thank you for allowing pharmacy to participate in this patient's care.  Rankin Sams, PharmD, BCPS, BCCCP Clinical Pharmacist

## 2024-09-12 NOTE — Progress Notes (Signed)
 Uncle Carlin Louder called, stating cousin Bernarda Palau is the decision maker, asking providers to call Bernarda for notifications, no need to call him. Cousin Alicia Otley called, stated she will arrive Wednesday late in the day and wishes for Mr. [name] to be made comfort care after her arrival.

## 2024-09-12 NOTE — Progress Notes (Signed)
 eLink Physician-Brief Progress Note Patient Name: [name] DOB: 1966/06/25 MRN: [id number]   Date of Service  09/12/2024  HPI/Events of Note  [place] [facility] requesting that Propofol  gtt be paused.  eICU Interventions  It was explained to [employer] that the Propofol  is for seizure suppression and cannot be paused in the absence of transition to full comfort measures or determination of brain death.        Mishael Haran U Demarius Archila 09/12/2024, 11:05 PM

## 2024-09-12 NOTE — Plan of Care (Signed)
  Problem: Education: Goal: Ability to manage disease process will improve 09/12/2024 0704 by Sarajane Burnette DASEN, RN Outcome: Not Progressing 09/12/2024 0703 by Sarajane Burnette DASEN, RN Outcome: Not Progressing   Problem: Cardiac: Goal: Ability to achieve and maintain adequate cardiopulmonary perfusion will improve 09/12/2024 0704 by Sarajane Burnette DASEN, RN Outcome: Not Progressing 09/12/2024 0703 by Sarajane Burnette DASEN, RN Outcome: Not Progressing   Problem: Neurologic: Goal: Promote progressive neurologic recovery 09/12/2024 0704 by Sarajane Burnette DASEN, RN Outcome: Not Progressing 09/12/2024 0703 by Sarajane Burnette DASEN, RN Outcome: Not Progressing   Problem: Skin Integrity: Goal: Risk for impaired skin integrity will be minimized. 09/12/2024 0704 by Sarajane Burnette DASEN, RN Outcome: Not Progressing 09/12/2024 0703 by Sarajane Burnette DASEN, RN Outcome: Not Progressing   Problem: Education: Goal: Knowledge of General Education information will improve Description: Including pain rating scale, medication(s)/side effects and non-pharmacologic comfort measures 09/12/2024 0704 by Sarajane Burnette DASEN, RN Outcome: Not Progressing 09/12/2024 0703 by Sarajane Burnette DASEN, RN Outcome: Not Progressing   Problem: Health Behavior/Discharge Planning: Goal: Ability to manage health-related needs will improve 09/12/2024 0704 by Sarajane Burnette DASEN, RN Outcome: Not Progressing 09/12/2024 0703 by Sarajane Burnette DASEN, RN Outcome: Not Progressing   Problem: Clinical Measurements: Goal: Ability to maintain clinical measurements within normal limits will improve 09/12/2024 0704 by Sarajane Burnette DASEN, RN Outcome: Not Progressing 09/12/2024 0703 by Sarajane Burnette DASEN, RN Outcome: Not Progressing Goal: Will remain free from infection 09/12/2024 0704 by Sarajane Burnette DASEN, RN Outcome: Not Progressing 09/12/2024 0703 by Sarajane Burnette DASEN, RN Outcome: Not  Progressing Goal: Diagnostic test results will improve 09/12/2024 0704 by Sarajane Burnette DASEN, RN Outcome: Not Progressing 09/12/2024 0703 by Sarajane Burnette DASEN, RN Outcome: Not Progressing Goal: Respiratory complications will improve 09/12/2024 0704 by Sarajane Burnette DASEN, RN Outcome: Not Progressing 09/12/2024 0703 by Sarajane Burnette DASEN, RN Outcome: Not Progressing Goal: Cardiovascular complication will be avoided 09/12/2024 0704 by Sarajane Burnette DASEN, RN Outcome: Not Progressing 09/12/2024 0703 by Sarajane Burnette DASEN, RN Outcome: Not Progressing   Problem: Activity: Goal: Risk for activity intolerance will decrease 09/12/2024 0704 by Sarajane Burnette DASEN, RN Outcome: Not Progressing 09/12/2024 0703 by Sarajane Burnette DASEN, RN Outcome: Not Progressing   Problem: Nutrition: Goal: Adequate nutrition will be maintained 09/12/2024 0704 by Sarajane Burnette DASEN, RN Outcome: Not Progressing 09/12/2024 0703 by Sarajane Burnette DASEN, RN Outcome: Not Progressing   Problem: Elimination: Goal: Will not experience complications related to bowel motility 09/12/2024 0704 by Sarajane Burnette DASEN, RN Outcome: Not Progressing 09/12/2024 0703 by Sarajane Burnette DASEN, RN Outcome: Not Progressing Goal: Will not experience complications related to urinary retention 09/12/2024 0704 by Sarajane Burnette DASEN, RN Outcome: Not Progressing 09/12/2024 0703 by Sarajane Burnette DASEN, RN Outcome: Not Progressing   Problem: Pain Managment: Goal: General experience of comfort will improve and/or be controlled 09/12/2024 0704 by Sarajane Burnette DASEN, RN Outcome: Not Progressing 09/12/2024 0703 by Sarajane Burnette DASEN, RN Outcome: Not Progressing   Problem: Safety: Goal: Ability to remain free from injury will improve 09/12/2024 0704 by Sarajane Burnette DASEN, RN Outcome: Not Progressing 09/12/2024 0703 by Sarajane Burnette DASEN, RN Outcome: Not Progressing   Problem: Skin  Integrity: Goal: Risk for impaired skin integrity will decrease 09/12/2024 0704 by Sarajane Burnette DASEN, RN Outcome: Not Progressing 09/12/2024 0703 by Sarajane Burnette DASEN, RN Outcome: Not Progressing

## 2024-09-12 NOTE — Progress Notes (Signed)
 Initial Nutrition Assessment  DOCUMENTATION CODES:  Not applicable  INTERVENTION:  Adjust tube feeding via OGT: Vital 1.2 at 55 ml/h (1320 ml per day) Prosource TF20 60 ml 1x/d Provides 1664 kcal, 119 gm protein, 1071 ml free water daily TF+propofol  = 2514 kcal/d Continue MVI, thiamine , and folic acid  for hx of alcohol abuse  NUTRITION DIAGNOSIS:  Inadequate oral intake related to inability to eat as evidenced by NPO status.  GOAL:  Patient will meet greater than or equal to 90% of their needs  MONITOR:  TF tolerance, Vent status, I & O's, Labs  REASON FOR ASSESSMENT:  Consult Enteral/tube feeding initiation and management  ASSESSMENT:  Pt with hx of HTN, HLD, CAD, DM type 2, ETOH abuse, and prior MI presented to ED unresponsive after being found down in a gas station bathroom.   11/20 - presented to ED, intubated 11/21 - TF protocol initiated   Patient is currently intubated on ventilator support. No family present at the time of assessment to provide a hx. On exam, pt overall well nourished but does have some mild muscle deficits.   Discussed in rounds, pt with poor neurological exam and imaging. Care team has discussed prognosis with family who plans to visit later this week and will transition pt to comfort. Adjust TF to better meet needs in the meantime.   MV: 12.7 L/min Temp (24hrs), Avg:97.9 F (36.6 C), Min:95.9 F (35.5 C), Max:107.8 F (42.1 C) MAP (Art Line): 63-79 mmHg  Propofol : 32.2 ml/hr (850 kcal/d)  Admit weight: 70.9 kg (first measured wt, 11/22)  Current weight: 70.5 kg    Intake/Output Summary (Last 24 hours) at 09/12/2024 1608 Last data filed at 09/12/2024 1325 Gross per 24 hour  Intake 1970.9 ml  Output 950 ml  Net 1020.9 ml  Net IO Since Admission: 4,165.63 mL [09/12/24 1608]  Drains/Lines: CVC Triple Lumen, left IJ UOP x 24 hours OGT, 16 Fr. (Gastric)  Nutritionally Relevant Medications: Scheduled Meds:  docusate  100 mg  Per Tube BID   famotidine   20 mg Per Tube BID   PROSource TF20  60 mL Per Tube Daily   VITAL HIGH PROTEIN  1,000 mL Per Tube Q24H   folic acid   1 mg Per Tube Daily   insulin  aspart  0-9 Units Subcutaneous Q4H   multivitamin with minerals  1 tablet Per Tube Daily   polyethylene glycol  17 g Per Tube Daily   thiamine  (VITAMIN B1)  100 mg Intravenous Daily   Continuous Infusions:  cefTRIAXone  (ROCEPHIN )  IV Stopped (09/11/24 1236)   potassium chloride  10 mEq (09/12/24 1013)   propofol  (DIPRIVAN ) infusion 80 mcg/kg/min (09/12/24 0911)   PRN Meds: docusate, polyethylene glycol  Labs Reviewed: Sodium 133 Potassium 3.3 CO2 16 Phosphorus 5.0 CBG ranges from 135-206 mg/dL over the last 24 hours HgbA1c 7.8% (11/20)  NUTRITION - FOCUSED PHYSICAL EXAM: Flowsheet Row Most Recent Value  Orbital Region No depletion  Upper Arm Region No depletion  Thoracic and Lumbar Region No depletion  Buccal Region No depletion  Temple Region No depletion  Clavicle Bone Region No depletion  Clavicle and Acromion Bone Region Mild depletion  Scapular Bone Region No depletion  Dorsal Hand No depletion  Patellar Region Mild depletion  Anterior Thigh Region No depletion  Posterior Calf Region No depletion  Edema (RD Assessment) Mild  Hair Reviewed  Eyes Reviewed  Mouth Reviewed  Skin Reviewed  Nails Reviewed    Diet Order:   Diet Order  Diet NPO time specified  Diet effective now                   EDUCATION NEEDS:  Not appropriate for education at this time  Skin:  Skin Assessment: Reviewed RN Assessment  Last BM:  11/24 - type 7 x 3 this AM FMS (pouch) placed 11/24  Height:  Ht Readings from Last 1 Encounters:  09/09/24 5' 11 (1.803 m)    Weight:  Wt Readings from Last 1 Encounters:  09/12/24 72.9 kg    Ideal Body Weight:  78.2 kg  BMI:  Body mass index is 22.42 kg/m.  Estimated Nutritional Needs:  Kcal:  2300-2500 kcal/d Protein:  115-130g/d Fluid:   2.5L/d    Vernell Lukes, RD, LDN, CNSC Registered Dietitian II Please reach out via secure chat

## 2024-09-12 NOTE — Plan of Care (Signed)
  Problem: Education: Goal: Ability to manage disease process will improve Outcome: Not Progressing   Problem: Cardiac: Goal: Ability to achieve and maintain adequate cardiopulmonary perfusion will improve Outcome: Not Progressing   Problem: Neurologic: Goal: Promote progressive neurologic recovery Outcome: Not Progressing   Problem: Education: Goal: Knowledge of General Education information will improve Description: Including pain rating scale, medication(s)/side effects and non-pharmacologic comfort measures Outcome: Not Progressing   Problem: Health Behavior/Discharge Planning: Goal: Ability to manage health-related needs will improve Outcome: Not Progressing   Problem: Clinical Measurements: Goal: Ability to maintain clinical measurements within normal limits will improve Outcome: Not Progressing Goal: Diagnostic test results will improve Outcome: Not Progressing Goal: Respiratory complications will improve Outcome: Not Progressing Goal: Cardiovascular complication will be avoided Outcome: Not Progressing   Problem: Activity: Goal: Risk for activity intolerance will decrease Outcome: Not Progressing   Problem: Nutrition: Goal: Adequate nutrition will be maintained Outcome: Not Progressing   Problem: Elimination: Goal: Will not experience complications related to bowel motility Outcome: Not Progressing   Problem: Skin Integrity: Goal: Risk for impaired skin integrity will decrease Outcome: Not Progressing  Remains unresponsive without reflexes on ventilator. Requires pressor support intermittently. Tube feeds held intermittently for hiccoughs to prevent aspiration. TTM dc'd today. Cousin to arrive Wednesday planning on comfort care after her arrival.

## 2024-09-12 NOTE — Progress Notes (Signed)
 eLink Physician-Brief Progress Note Patient Name: Jadarion Halbig DOB: Dec 14, 1965 MRN: [id number]   Date of Service  09/12/2024  HPI/Events of Note  Patient having hiccups.  eICU Interventions  PRN Thorazine  ordered.        Lamaria Hildebrandt U Shemeca Lukasik 09/12/2024, 11:45 PM

## 2024-09-12 NOTE — Plan of Care (Signed)
  Problem: Education: Goal: Ability to manage disease process will improve Outcome: Not Progressing   Problem: Cardiac: Goal: Ability to achieve and maintain adequate cardiopulmonary perfusion will improve Outcome: Not Progressing   Problem: Neurologic: Goal: Promote progressive neurologic recovery Outcome: Not Progressing   Problem: Skin Integrity: Goal: Risk for impaired skin integrity will be minimized. Outcome: Not Progressing   Problem: Education: Goal: Knowledge of General Education information will improve Description: Including pain rating scale, medication(s)/side effects and non-pharmacologic comfort measures Outcome: Not Progressing   Problem: Health Behavior/Discharge Planning: Goal: Ability to manage health-related needs will improve Outcome: Not Progressing   Problem: Clinical Measurements: Goal: Ability to maintain clinical measurements within normal limits will improve Outcome: Not Progressing Goal: Will remain free from infection Outcome: Not Progressing Goal: Diagnostic test results will improve Outcome: Not Progressing Goal: Respiratory complications will improve Outcome: Not Progressing Goal: Cardiovascular complication will be avoided Outcome: Not Progressing   Problem: Activity: Goal: Risk for activity intolerance will decrease Outcome: Not Progressing   Problem: Nutrition: Goal: Adequate nutrition will be maintained Outcome: Not Progressing   Problem: Elimination: Goal: Will not experience complications related to bowel motility Outcome: Not Progressing Goal: Will not experience complications related to urinary retention Outcome: Not Progressing   Problem: Pain Managment: Goal: General experience of comfort will improve and/or be controlled Outcome: Not Progressing   Problem: Safety: Goal: Ability to remain free from injury will improve Outcome: Not Progressing   Problem: Skin Integrity: Goal: Risk for impaired skin integrity will  decrease Outcome: Not Progressing

## 2024-09-12 NOTE — Procedures (Signed)
 Patient Name: [name]  MRN: [id number]  Epilepsy Attending: Arlin MALVA Krebs  Referring Physician/Provider: Khaliqdina, Salman, MD Duration: 09/11/2024 0824 to 09/11/2024 1611   Patient history: 58yo M sp cardiac arrest. EEG to evaluate for seizure   Level of alertness: comatose   AEDs during EEG study: Propofol , LEV   Technical aspects: This EEG study was done with scalp electrodes positioned according to the 10-20 International system of electrode placement. Electrical activity was reviewed with band pass filter of 1-70Hz , sensitivity of 7 uV/mm, display speed of 52mm/sec with a 60Hz  notched filter applied as appropriate. EEG data were recorded continuously and digitally stored.  Video monitoring was available and reviewed as appropriate.   Description: EEG showed continuous generalized background suppression. Hyperventilation and photic stimulation were not performed.      ABNORMALITY - Background suppression, generalized   IMPRESSION: This study is suggestive of profound diffuse encephalopathy. No seizures or epileptiform discharges were seen throughout the recording.   Virgal Warmuth O Shamaria Kavan

## 2024-09-12 NOTE — Plan of Care (Signed)
 Neurology plan of care  MRI brain showed e/o severe anoxic brain injury. Patient's family has requested to transition him to comfort care on Wednesday after family arrives. Neurology will sign off, but please reach out if we can be of any further assistance.  Elida Ross, MD [facility] [phone]  If 7pm- 7am, please page neurology on call as listed in AMION.

## 2024-09-12 NOTE — Progress Notes (Signed)
 NAME:  Neil Brickell, MRN:  [id number], DOB:  Nov 02, 1965, LOS: 4 ADMISSION DATE:  09/08/2024, CONSULTATION DATE:  09/08/2024 CHIEF COMPLAINT:  Cardiac arrest ([facility])  Summary  [name] is a 58 year old male with past medical history significant for multivessel CAD, HTN, HLD, type 2 DM who presented via EMS following cardiac arrest. Patient was found down in gas station bathroom for approx 33 minutes. ROSC achieved after 18 minutes of CPR, 1 Epi, 50 mcg Fentanyl , 500ml NS. Patient arrived via EMS intubated. In ED, patient received Narcan  with no improvement in neurological status, an arterial line was placed for hypotension, and patient was started on vasopressors. PCCM consulted for ICU evaluation post-cardiac arrest. Found patient to have frequent myoclonus, started propofol  and loaded with Keppra  and valproate.  Pertinent  Medical History   Past Medical History:  Diagnosis Date   Acid reflux    Concussion    Coronary artery disease    Diabetes (HCC)    ETOH abuse    High cholesterol    Hypertension    MI (myocardial infarction) (HCC)    2012   Sleep apnea    USES CPAP AS NEEDED   Tobacco abuse     Significant Hospital Events: Including procedures, antibiotic start and stop dates in addition to other pertinent events   11/20: Cardiac arrest, intubation, myoclonus observed, propofol  + Keppra  + valproate loaded. 11/21: Rhythmic bilateral foot movements observed. 11/22: Start propofol  wean while assessing for return of myoclonus. 11/23: Awaiting MRI.  Interim History / Subjective:  MRI brain confirms HIE. He continues to be deeply sedated due to recalcitrant myoclonus.   Objective    Blood pressure 100/77, pulse 69, temperature 98.1 F (36.7 C), temperature source Bladder, resp. rate 20, height 5' 11 (1.803 m), weight 72.9 kg, SpO2 100%.    Vent Mode: PRVC FiO2 (%):  [40 %] 40 % Set Rate:  [20 bmp] 20 bmp Vt Set:  [600 mL] 600 mL PEEP:  [5 cmH20] 5 cmH20 Plateau  Pressure:  [16 cmH20-20 cmH20] 20 cmH20   Intake/Output Summary (Last 24 hours) at 09/12/2024 1253 Last data filed at 09/12/2024 1217 Gross per 24 hour  Intake 1387.55 ml  Output 950 ml  Net 437.55 ml   Filed Weights   09/11/24 0308 09/12/24 0500 09/12/24 1209  Weight: 70.5 kg 70.5 kg 72.9 kg    Examination: Gen:      Intubated, sedated, acutely ill appearing HEENT:  ETT to vent Lungs:    sounds of mechanical ventilation auscultated no wheeze CV:         RRR Abd:      + bowel sounds; soft, non-tender; no palpable masses, no distension Ext:    No edema Skin:      Warm and dry; no rashes Neuro:   sedated on propofol , not responsive, no gag, no cough. No corneal reflex   Assessment and Plan   # S/p cardiac arrest # Myoclonus # Acute hypoxemic respiratory failure # Metabolic acidosis, non-gap # Early LV thrombus # Acute systolic heart failure (post-arrest)  NEURO: S/p cardiac arrest. Clinical examination with several concerning features including but not limited to myoclonus. MRI confirms hypoxic ischemic encephalopathy. Have reached out to [facility] and Arzella Hila, awaiting call back.  COR:  Acute systolic heart failure with possible LV thrombus. Continue heparin  gtt. Consider repeat echo in future pending clinical course.   PULM: Continue lung protective ventilation. Possible aspiration pneumonia (had fever beneath [facility]). Continue ceftriaxone   for 7 day course. F/u culture data. VAP bundle  GI: continue tube feeds  TOX: Alcohol intoxication positive on admission. continue MV/Thiamine . Utox negative.   DVT PPX: continue Heparin  drip. GI PPX: continue Famotidine . Code Status: DNR/Do Not Re-intubate  Labs   CBC: Recent Labs  Lab 09/08/24 1049 09/09/24 0425 09/10/24 0305 09/11/24 0309 09/12/24 0537  WBC 1.9* 10.5 8.5 6.0 6.3  HGB 10.9* 10.3* 9.8* 11.0* 10.5*  HCT 32.5* 28.1* 27.4* 31.1* 28.8*  MCV 100.9* 94.3 95.8 96.3 94.7  PLT 147* 161 147* 134*  159    Basic Metabolic Panel: Recent Labs  Lab 09/08/24 1049 09/08/24 2105 09/09/24 0425 09/09/24 1700 09/10/24 0305 09/11/24 0309 09/12/24 0537  NA 133* 133* 132* 135 133* 136 133*  K 3.6 2.7* 3.2* 3.9 3.4* 3.3* 3.3*  CL 96* 95* 97* 102 98 97* 102  CO2 15* 15* 16* 17* 19* 21* 16*  GLUCOSE 144* 134* 151* 126* 213* 115* 169*  BUN 9 12 13 10 10 12 15   CREATININE 1.14 1.19 1.53* 1.32* 1.10 0.94 1.00  CALCIUM  8.4* 8.9 8.6* 8.6* 8.5* 8.9 8.4*  MG 1.6* 2.3 2.1  --  1.9 1.8 1.8  PHOS 6.8* 3.0  --   --  2.1* 2.3* 5.0*   GFR: Estimated Creatinine Clearance: 84 mL/min (by C-G formula based on SCr of 1 mg/dL). Recent Labs  Lab 09/08/24 1050 09/08/24 2107 09/09/24 0425 09/10/24 0305 09/11/24 0309 09/12/24 0537  WBC  --   --  10.5 8.5 6.0 6.3  LATICACIDVEN 10.5* 1.3  --   --   --   --     Liver Function Tests: Recent Labs  Lab 09/08/24 1049 09/09/24 0425  AST 247* 115*  ALT 67* 57*  ALKPHOS 61 54  BILITOT 0.8 1.4*  PROT 6.5 6.4*  ALBUMIN  3.1* 2.9*   No results for input(s): LIPASE, AMYLASE in the last 168 hours. No results for input(s): AMMONIA in the last 168 hours.  ABG    Component Value Date/Time   PHART 7.371 09/08/2024 1044   PCO2ART 23.1 (L) 09/08/2024 1044   PO2ART 506 (H) 09/08/2024 1044   HCO3 13.4 (L) 09/08/2024 1044   TCO2 14 (L) 09/08/2024 1044   ACIDBASEDEF 10.0 (H) 09/08/2024 1044   O2SAT 100 09/09/2024 1043     Coagulation Profile: Recent Labs  Lab 09/08/24 1049  INR 1.1    Cardiac Enzymes: No results for input(s): CKTOTAL, CKMB, CKMBINDEX, TROPONINI in the last 168 hours.  HbA1C: Hgb A1c MFr Bld  Date/Time Value Ref Range Status  09/08/2024 01:43 PM 7.8 (H) 4.8 - 5.6 % Final    Comment:    (NOTE) Diagnosis of Diabetes The following HbA1c ranges recommended by the American Diabetes Association (ADA) may be used as an aid in the diagnosis of diabetes mellitus.  Hemoglobin             Suggested A1C NGSP%               Diagnosis  <5.7                   Non Diabetic  5.7-6.4                Pre-Diabetic  >6.4                   Diabetic  <7.0                   Glycemic control for  adults with diabetes.    07/03/2022 07:35 PM 7.9 (H) 4.8 - 5.6 % Final    Comment:    (NOTE) Pre diabetes:          5.7%-6.4%  Diabetes:              >6.4%  Glycemic control for   <7.0% adults with diabetes     CBG: Recent Labs  Lab 09/11/24 1928 09/11/24 2314 09/12/24 0309 09/12/24 0750 09/12/24 1136  GLUCAP 165* 157* 206* 172* 155*   The patient is critically ill due to cardiac arrest.  Critical care was necessary to treat or prevent imminent or life-threatening deterioration.  Critical care was time spent personally by me on the following activities: development of treatment plan with patient and/or surrogate as well as nursing, discussions with consultants, evaluation of patient's response to treatment, examination of patient, obtaining history from patient or surrogate, ordering and performing treatments and interventions, ordering and review of laboratory studies, ordering and review of radiographic studies, pulse oximetry, re-evaluation of patient's condition and participation in multidisciplinary rounds.   Critical care was time spent personally by me on the following activities: high risk medication and infusion titration and management, mechanical ventilation management and titration, weaning of mechanical ventilation, frequent neurological checks, and end of life planning conversations , development of treatment plan with patient and/or surrogate as well as nursing, discussions with consultants, evaluation of patient's response to treatment, examination of patient, obtaining history from patient or surrogate, ordering and performing treatments and interventions, ordering and review of laboratory studies, ordering and review of radiographic studies, pulse oximetry, re-evaluation of  patient's condition and participation in multidisciplinary rounds.  Critical Care Time devoted to patient care services described in this note is 37 minutes. This time reflects time of care of this signee Jasimine Simms S Achsah Mcquade . This critical care time does not reflect separately billable procedures or procedure time, teaching time or supervisory time of PA/NP/Med student/Med Resident etc but could involve care discussion time.       Verdon GORMAN [facility] 09/12/2024 12:57 PM  Pager: see AMION  If no response to pager , please call critical care on call (see AMION) until 7pm After 7:00 pm call Elink

## 2024-09-13 ENCOUNTER — Inpatient Hospital Stay (HOSPITAL_COMMUNITY)

## 2024-09-13 DIAGNOSIS — Z529 Donor of unspecified organ or tissue: Secondary | ICD-10-CM

## 2024-09-13 LAB — POCT I-STAT 7, (LYTES, BLD GAS, ICA,H+H)
Acid-base deficit: 2 mmol/L (ref 0.0–2.0)
Acid-base deficit: 4 mmol/L — ABNORMAL HIGH (ref 0.0–2.0)
Acid-base deficit: 4 mmol/L — ABNORMAL HIGH (ref 0.0–2.0)
Acid-base deficit: 4 mmol/L — ABNORMAL HIGH (ref 0.0–2.0)
Acid-base deficit: 5 mmol/L — ABNORMAL HIGH (ref 0.0–2.0)
Acid-base deficit: 5 mmol/L — ABNORMAL HIGH (ref 0.0–2.0)
Acid-base deficit: 8 mmol/L — ABNORMAL HIGH (ref 0.0–2.0)
Bicarbonate: 16.3 mmol/L — ABNORMAL LOW (ref 20.0–28.0)
Bicarbonate: 19 mmol/L — ABNORMAL LOW (ref 20.0–28.0)
Bicarbonate: 19.4 mmol/L — ABNORMAL LOW (ref 20.0–28.0)
Bicarbonate: 19.6 mmol/L — ABNORMAL LOW (ref 20.0–28.0)
Bicarbonate: 19.7 mmol/L — ABNORMAL LOW (ref 20.0–28.0)
Bicarbonate: 19.7 mmol/L — ABNORMAL LOW (ref 20.0–28.0)
Bicarbonate: 20.1 mmol/L (ref 20.0–28.0)
Calcium, Ion: 1.1 mmol/L — ABNORMAL LOW (ref 1.15–1.40)
Calcium, Ion: 1.16 mmol/L (ref 1.15–1.40)
Calcium, Ion: 1.17 mmol/L (ref 1.15–1.40)
Calcium, Ion: 1.18 mmol/L (ref 1.15–1.40)
Calcium, Ion: 1.18 mmol/L (ref 1.15–1.40)
Calcium, Ion: 1.2 mmol/L (ref 1.15–1.40)
Calcium, Ion: 1.22 mmol/L (ref 1.15–1.40)
HCT: 21 % — ABNORMAL LOW (ref 39.0–52.0)
HCT: 23 % — ABNORMAL LOW (ref 39.0–52.0)
HCT: 26 % — ABNORMAL LOW (ref 39.0–52.0)
HCT: 26 % — ABNORMAL LOW (ref 39.0–52.0)
HCT: 26 % — ABNORMAL LOW (ref 39.0–52.0)
HCT: 28 % — ABNORMAL LOW (ref 39.0–52.0)
HCT: 37 % — ABNORMAL LOW (ref 39.0–52.0)
Hemoglobin: 12.6 g/dL — ABNORMAL LOW (ref 13.0–17.0)
Hemoglobin: 7.1 g/dL — ABNORMAL LOW (ref 13.0–17.0)
Hemoglobin: 7.8 g/dL — ABNORMAL LOW (ref 13.0–17.0)
Hemoglobin: 8.8 g/dL — ABNORMAL LOW (ref 13.0–17.0)
Hemoglobin: 8.8 g/dL — ABNORMAL LOW (ref 13.0–17.0)
Hemoglobin: 8.8 g/dL — ABNORMAL LOW (ref 13.0–17.0)
Hemoglobin: 9.5 g/dL — ABNORMAL LOW (ref 13.0–17.0)
O2 Saturation: 100 %
O2 Saturation: 100 %
O2 Saturation: 100 %
O2 Saturation: 100 %
O2 Saturation: 100 %
O2 Saturation: 100 %
O2 Saturation: 100 %
Patient temperature: 38.2
Patient temperature: 97
Patient temperature: 97.23
Patient temperature: 98.4
Potassium: 3 mmol/L — ABNORMAL LOW (ref 3.5–5.1)
Potassium: 3.6 mmol/L (ref 3.5–5.1)
Potassium: 3.7 mmol/L (ref 3.5–5.1)
Potassium: 3.7 mmol/L (ref 3.5–5.1)
Potassium: 3.8 mmol/L (ref 3.5–5.1)
Potassium: 3.8 mmol/L (ref 3.5–5.1)
Potassium: 3.9 mmol/L (ref 3.5–5.1)
Sodium: 131 mmol/L — ABNORMAL LOW (ref 135–145)
Sodium: 132 mmol/L — ABNORMAL LOW (ref 135–145)
Sodium: 132 mmol/L — ABNORMAL LOW (ref 135–145)
Sodium: 133 mmol/L — ABNORMAL LOW (ref 135–145)
Sodium: 134 mmol/L — ABNORMAL LOW (ref 135–145)
Sodium: 135 mmol/L (ref 135–145)
Sodium: 137 mmol/L (ref 135–145)
TCO2: 17 mmol/L — ABNORMAL LOW (ref 22–32)
TCO2: 20 mmol/L — ABNORMAL LOW (ref 22–32)
TCO2: 20 mmol/L — ABNORMAL LOW (ref 22–32)
TCO2: 20 mmol/L — ABNORMAL LOW (ref 22–32)
TCO2: 20 mmol/L — ABNORMAL LOW (ref 22–32)
TCO2: 21 mmol/L — ABNORMAL LOW (ref 22–32)
TCO2: 21 mmol/L — ABNORMAL LOW (ref 22–32)
pCO2 arterial: 25.3 mmHg — ABNORMAL LOW (ref 32–48)
pCO2 arterial: 27.6 mmHg — ABNORMAL LOW (ref 32–48)
pCO2 arterial: 28.2 mmHg — ABNORMAL LOW (ref 32–48)
pCO2 arterial: 29.7 mmHg — ABNORMAL LOW (ref 32–48)
pCO2 arterial: 29.8 mmHg — ABNORMAL LOW (ref 32–48)
pCO2 arterial: 29.9 mmHg — ABNORMAL LOW (ref 32–48)
pCO2 arterial: 32.4 mmHg (ref 32–48)
pH, Arterial: 7.379 (ref 7.35–7.45)
pH, Arterial: 7.405 (ref 7.35–7.45)
pH, Arterial: 7.418 (ref 7.35–7.45)
pH, Arterial: 7.427 (ref 7.35–7.45)
pH, Arterial: 7.427 (ref 7.35–7.45)
pH, Arterial: 7.432 (ref 7.35–7.45)
pH, Arterial: 7.5 — ABNORMAL HIGH (ref 7.35–7.45)
pO2, Arterial: 200 mmHg — ABNORMAL HIGH (ref 83–108)
pO2, Arterial: 203 mmHg — ABNORMAL HIGH (ref 83–108)
pO2, Arterial: 463 mmHg — ABNORMAL HIGH (ref 83–108)
pO2, Arterial: 469 mmHg — ABNORMAL HIGH (ref 83–108)
pO2, Arterial: 477 mmHg — ABNORMAL HIGH (ref 83–108)
pO2, Arterial: 528 mmHg — ABNORMAL HIGH (ref 83–108)
pO2, Arterial: 537 mmHg — ABNORMAL HIGH (ref 83–108)

## 2024-09-13 LAB — BASIC METABOLIC PANEL WITH GFR
Anion gap: 12 (ref 5–15)
Anion gap: 11 (ref 5–15)
Anion gap: 10 (ref 5–15)
Anion gap: 11 (ref 5–15)
Anion gap: 9 (ref 5–15)
BUN: 14 mg/dL (ref 6–20)
BUN: 15 mg/dL (ref 6–20)
BUN: 16 mg/dL (ref 6–20)
BUN: 16 mg/dL (ref 6–20)
BUN: 14 mg/dL (ref 6–20)
CO2: 18 mmol/L — ABNORMAL LOW (ref 22–32)
CO2: 19 mmol/L — ABNORMAL LOW (ref 22–32)
CO2: 19 mmol/L — ABNORMAL LOW (ref 22–32)
CO2: 19 mmol/L — ABNORMAL LOW (ref 22–32)
CO2: 20 mmol/L — ABNORMAL LOW (ref 22–32)
Calcium: 8.3 mg/dL — ABNORMAL LOW (ref 8.9–10.3)
Calcium: 8.4 mg/dL — ABNORMAL LOW (ref 8.9–10.3)
Calcium: 8.4 mg/dL — ABNORMAL LOW (ref 8.9–10.3)
Calcium: 8.3 mg/dL — ABNORMAL LOW (ref 8.9–10.3)
Calcium: 8.4 mg/dL — ABNORMAL LOW (ref 8.9–10.3)
Chloride: 103 mmol/L (ref 98–111)
Chloride: 103 mmol/L (ref 98–111)
Chloride: 102 mmol/L (ref 98–111)
Chloride: 101 mmol/L (ref 98–111)
Chloride: 102 mmol/L (ref 98–111)
Creatinine, Ser: 0.96 mg/dL (ref 0.61–1.24)
Creatinine, Ser: 1.04 mg/dL (ref 0.61–1.24)
Creatinine, Ser: 0.77 mg/dL (ref 0.61–1.24)
Creatinine, Ser: 0.76 mg/dL (ref 0.61–1.24)
Creatinine, Ser: 0.8 mg/dL (ref 0.61–1.24)
GFR, Estimated: >60 mL/min (ref >60)
GFR, Estimated: >60 mL/min (ref >60)
GFR, Estimated: >60 mL/min (ref >60)
GFR, Estimated: >60 mL/min (ref >60)
GFR, Estimated: >60 mL/min (ref >60)
Glucose, Bld: 204 mg/dL — ABNORMAL HIGH (ref 70–99)
Glucose, Bld: 263 mg/dL — ABNORMAL HIGH (ref 70–99)
Glucose, Bld: 174 mg/dL — ABNORMAL HIGH (ref 70–99)
Glucose, Bld: 200 mg/dL — ABNORMAL HIGH (ref 70–99)
Glucose, Bld: 230 mg/dL — ABNORMAL HIGH (ref 70–99)
Potassium: 3.5 mmol/L (ref 3.5–5.1)
Potassium: 3.9 mmol/L (ref 3.5–5.1)
Potassium: 3.8 mmol/L (ref 3.5–5.1)
Potassium: 3.8 mmol/L (ref 3.5–5.1)
Potassium: 3.8 mmol/L (ref 3.5–5.1)
Sodium: 133 mmol/L — ABNORMAL LOW (ref 135–145)
Sodium: 133 mmol/L — ABNORMAL LOW (ref 135–145)
Sodium: 131 mmol/L — ABNORMAL LOW (ref 135–145)
Sodium: 131 mmol/L — ABNORMAL LOW (ref 135–145)
Sodium: 131 mmol/L — ABNORMAL LOW (ref 135–145)

## 2024-09-13 LAB — HEPATIC FUNCTION PANEL
ALT: 22 U/L (ref 0–44)
ALT: 20 U/L (ref 0–44)
ALT: 19 U/L (ref 0–44)
ALT: 20 U/L (ref 0–44)
ALT: 19 U/L (ref 0–44)
AST: 81 U/L — ABNORMAL HIGH (ref 15–41)
AST: 76 U/L — ABNORMAL HIGH (ref 15–41)
AST: 73 U/L — ABNORMAL HIGH (ref 15–41)
AST: 71 U/L — ABNORMAL HIGH (ref 15–41)
AST: 67 U/L — ABNORMAL HIGH (ref 15–41)
Albumin: 2.4 g/dL — ABNORMAL LOW (ref 3.5–5.0)
Albumin: 2.3 g/dL — ABNORMAL LOW (ref 3.5–5.0)
Albumin: 2.3 g/dL — ABNORMAL LOW (ref 3.5–5.0)
Albumin: 2.3 g/dL — ABNORMAL LOW (ref 3.5–5.0)
Albumin: 2.3 g/dL — ABNORMAL LOW (ref 3.5–5.0)
Alkaline Phosphatase: 61 U/L (ref 38–126)
Alkaline Phosphatase: 66 U/L (ref 38–126)
Alkaline Phosphatase: 64 U/L (ref 38–126)
Alkaline Phosphatase: 64 U/L (ref 38–126)
Alkaline Phosphatase: 67 U/L (ref 38–126)
Bilirubin, Direct: <0.1 mg/dL (ref 0.0–0.2)
Bilirubin, Direct: <0.1 mg/dL (ref 0.0–0.2)
Bilirubin, Direct: <0.1 mg/dL (ref 0.0–0.2)
Bilirubin, Direct: 0.1 mg/dL (ref 0.0–0.2)
Bilirubin, Direct: 0.1 mg/dL (ref 0.0–0.2)
Indirect Bilirubin: 0.2 mg/dL — ABNORMAL LOW (ref 0.3–0.9)
Indirect Bilirubin: 0.1 mg/dL — ABNORMAL LOW (ref 0.3–0.9)
Total Bilirubin: 0.6 mg/dL (ref 0.0–1.2)
Total Bilirubin: 0.2 mg/dL (ref 0.0–1.2)
Total Bilirubin: 0.4 mg/dL (ref 0.0–1.2)
Total Bilirubin: 0.3 mg/dL (ref 0.0–1.2)
Total Bilirubin: 0.2 mg/dL (ref 0.0–1.2)
Total Protein: 6 g/dL — ABNORMAL LOW (ref 6.5–8.1)
Total Protein: 5.7 g/dL — ABNORMAL LOW (ref 6.5–8.1)
Total Protein: 6 g/dL — ABNORMAL LOW (ref 6.5–8.1)
Total Protein: 6 g/dL — ABNORMAL LOW (ref 6.5–8.1)
Total Protein: 6.1 g/dL — ABNORMAL LOW (ref 6.5–8.1)

## 2024-09-13 LAB — CBC
HCT: 27.3 % — ABNORMAL LOW (ref 39.0–52.0)
HCT: 25.8 % — ABNORMAL LOW (ref 39.0–52.0)
HCT: 27.1 % — ABNORMAL LOW (ref 39.0–52.0)
HCT: 27.5 % — ABNORMAL LOW (ref 39.0–52.0)
Hemoglobin: 9.4 g/dL — ABNORMAL LOW (ref 13.0–17.0)
Hemoglobin: 9.1 g/dL — ABNORMAL LOW (ref 13.0–17.0)
Hemoglobin: 9.4 g/dL — ABNORMAL LOW (ref 13.0–17.0)
Hemoglobin: 9.4 g/dL — ABNORMAL LOW (ref 13.0–17.0)
MCH: 34.3 pg — ABNORMAL HIGH (ref 26.0–34.0)
MCH: 35 pg — ABNORMAL HIGH (ref 26.0–34.0)
MCH: 34.4 pg — ABNORMAL HIGH (ref 26.0–34.0)
MCH: 34.2 pg — ABNORMAL HIGH (ref 26.0–34.0)
MCHC: 34.4 g/dL (ref 30.0–36.0)
MCHC: 35.3 g/dL (ref 30.0–36.0)
MCHC: 34.7 g/dL (ref 30.0–36.0)
MCHC: 34.2 g/dL (ref 30.0–36.0)
MCV: 99.6 fL (ref 80.0–100.0)
MCV: 99.2 fL (ref 80.0–100.0)
MCV: 99.3 fL (ref 80.0–100.0)
MCV: 100 fL (ref 80.0–100.0)
Platelets: 175 K/uL (ref 150–400)
Platelets: 184 K/uL (ref 150–400)
Platelets: 182 K/uL (ref 150–400)
Platelets: 183 K/uL (ref 150–400)
RBC: 2.74 MIL/uL — ABNORMAL LOW (ref 4.22–5.81)
RBC: 2.6 MIL/uL — ABNORMAL LOW (ref 4.22–5.81)
RBC: 2.73 MIL/uL — ABNORMAL LOW (ref 4.22–5.81)
RBC: 2.75 MIL/uL — ABNORMAL LOW (ref 4.22–5.81)
RDW: 13.3 % (ref 11.5–15.5)
RDW: 13.3 % (ref 11.5–15.5)
RDW: 13.2 % (ref 11.5–15.5)
RDW: 13.1 % (ref 11.5–15.5)
WBC: 6.5 K/uL (ref 4.0–10.5)
WBC: 5.9 K/uL (ref 4.0–10.5)
WBC: 6.8 K/uL (ref 4.0–10.5)
WBC: 7.4 K/uL (ref 4.0–10.5)
nRBC: 0 % (ref 0.0–0.2)
nRBC: 0 % (ref 0.0–0.2)
nRBC: 0 % (ref 0.0–0.2)
nRBC: 0 % (ref 0.0–0.2)

## 2024-09-13 LAB — LIPASE, BLOOD
Lipase: 24 U/L (ref 11–51)
Lipase: 22 U/L (ref 11–51)
Lipase: 23 U/L (ref 11–51)
Lipase: 23 U/L (ref 11–51)
Lipase: 22 U/L (ref 11–51)

## 2024-09-13 LAB — ECHOCARDIOGRAM COMPLETE
Area-P 1/2: 3.6 cm2
Calc EF: 22.7 %
Height: 71 in
S' Lateral: 4.3 cm
Single Plane A2C EF: 16.9 %
Single Plane A4C EF: 24.3 %
Weight: 2557.34 [oz_av]

## 2024-09-13 LAB — AMYLASE
Amylase: 41 U/L (ref 28–100)
Amylase: 40 U/L (ref 28–100)
Amylase: 38 U/L (ref 28–100)
Amylase: 40 U/L (ref 28–100)
Amylase: 40 U/L (ref 28–100)

## 2024-09-13 LAB — FIBRINOGEN
Fibrinogen: 716 mg/dL — ABNORMAL HIGH (ref 210–475)
Fibrinogen: 753 mg/dL — ABNORMAL HIGH (ref 210–475)
Fibrinogen: 743 mg/dL — ABNORMAL HIGH (ref 210–475)
Fibrinogen: 745 mg/dL — ABNORMAL HIGH (ref 210–475)

## 2024-09-13 LAB — APTT
aPTT: 117 s — ABNORMAL HIGH (ref 24–36)
aPTT: 109 s — ABNORMAL HIGH (ref 24–36)
aPTT: 156 s — ABNORMAL HIGH (ref 24–36)
aPTT: 91 s — ABNORMAL HIGH (ref 24–36)

## 2024-09-13 LAB — TYPE AND SCREEN: PT AG Type: POSITIVE

## 2024-09-13 LAB — URINALYSIS, ROUTINE W REFLEX MICROSCOPIC
Bilirubin Urine: NEGATIVE
Glucose, UA: NEGATIVE mg/dL
Hgb urine dipstick: NEGATIVE
Ketones, ur: NEGATIVE mg/dL
Leukocytes,Ua: NEGATIVE
Nitrite: NEGATIVE
Protein, ur: NEGATIVE mg/dL
Specific Gravity, Urine: >1.046 — ABNORMAL HIGH (ref 1.005–1.030)
pH: 5 (ref 5.0–8.0)

## 2024-09-13 LAB — MAGNESIUM
Magnesium: 1.9 mg/dL (ref 1.7–2.4)
Magnesium: 1.9 mg/dL (ref 1.7–2.4)
Magnesium: 1.9 mg/dL (ref 1.7–2.4)
Magnesium: 1.7 mg/dL (ref 1.7–2.4)
Magnesium: 1.8 mg/dL (ref 1.7–2.4)

## 2024-09-13 LAB — PROTIME-INR
INR: 1 (ref 0.8–1.2)
INR: 1 (ref 0.8–1.2)
INR: 1 (ref 0.8–1.2)
INR: 1 (ref 0.8–1.2)
Prothrombin Time: 14 s (ref 11.4–15.2)
Prothrombin Time: 14 s (ref 11.4–15.2)
Prothrombin Time: 13.9 s (ref 11.4–15.2)
Prothrombin Time: 13.8 s (ref 11.4–15.2)

## 2024-09-13 LAB — GLUCOSE, CAPILLARY
Glucose-Capillary: 184 mg/dL — ABNORMAL HIGH (ref 70–99)
Glucose-Capillary: 187 mg/dL — ABNORMAL HIGH (ref 70–99)
Glucose-Capillary: 187 mg/dL — ABNORMAL HIGH (ref 70–99)
Glucose-Capillary: 193 mg/dL — ABNORMAL HIGH (ref 70–99)
Glucose-Capillary: 207 mg/dL — ABNORMAL HIGH (ref 70–99)
Glucose-Capillary: 208 mg/dL — ABNORMAL HIGH (ref 70–99)

## 2024-09-13 LAB — TROPONIN I (HIGH SENSITIVITY)
Troponin I (High Sensitivity): 68 ng/L — ABNORMAL HIGH (ref <18)
Troponin I (High Sensitivity): 89 ng/L — ABNORMAL HIGH (ref <18)

## 2024-09-13 LAB — RESP PANEL BY RT-PCR (RSV, FLU A&B, COVID)  RVPGX2
Influenza A by PCR: NEGATIVE
Influenza B by PCR: NEGATIVE
Resp Syncytial Virus by PCR: NEGATIVE
SARS Coronavirus 2 by RT PCR: NEGATIVE

## 2024-09-13 LAB — PHOSPHORUS
Phosphorus: 4.6 mg/dL (ref 2.5–4.6)
Phosphorus: 4.9 mg/dL — ABNORMAL HIGH (ref 2.5–4.6)
Phosphorus: 4.2 mg/dL (ref 2.5–4.6)
Phosphorus: 3.9 mg/dL (ref 2.5–4.6)
Phosphorus: 3.1 mg/dL (ref 2.5–4.6)

## 2024-09-13 LAB — HEPARIN LEVEL (UNFRACTIONATED)
Heparin Unfractionated: 0.57 [IU]/mL (ref 0.30–0.70)
Heparin Unfractionated: 0.73 [IU]/mL — ABNORMAL HIGH (ref 0.30–0.70)

## 2024-09-13 LAB — CK TOTAL AND CKMB (NOT AT ARMC)
CK, MB: 1.1 ng/mL (ref 0.5–5.0)
Total CK: 143 U/L (ref 49–397)

## 2024-09-13 LAB — HEMOGLOBIN A1C
Hgb A1c MFr Bld: 7.8 % — ABNORMAL HIGH (ref 4.8–5.6)
Mean Plasma Glucose: 177 mg/dL

## 2024-09-13 MED ORDER — PERFLUTREN LIPID MICROSPHERE
1.0000 mL | INTRAVENOUS | Status: AC | PRN
  Administered 2024-09-13: 2 mL via INTRAVENOUS

## 2024-09-13 MED ORDER — SODIUM CHLORIDE 0.9 % IV SOLN: INTRAVENOUS | Status: AC | PRN

## 2024-09-13 MED ORDER — IOHEXOL 350 MG/ML SOLN
75.0000 mL | Freq: Once | INTRAVENOUS | Status: AC | PRN
  Administered 2024-09-13: 75 mL via INTRAVENOUS

## 2024-09-13 MED ORDER — ACETAMINOPHEN 325 MG PO TABS
650.0000 mg | ORAL_TABLET | ORAL | Status: DC | PRN
  Administered 2024-09-13: 650 mg
  Filled 2024-09-13: qty 2

## 2024-09-13 NOTE — Progress Notes (Signed)
 Notes reviewed, per neurology plan is for comfort measures to start tomorrow. Cardiology will sign off, but please contact us  with questions or concerns.  Anthony Bruckner, MD, PhD, [facility] [place]  [facility]  [place]  [facility] at [facility] at [facility] [address] [place], [place] [address] [phone]

## 2024-09-13 NOTE — Progress Notes (Signed)
 [facility] requested pt on SBT at this time and to hold ABG's until further notice. 19:00 ABG will be held.

## 2024-09-13 NOTE — Progress Notes (Signed)
 Discussed with Joy from [facility] and Dr. Meade. Okay to slowly lighten sedation per order to assess neurological status.

## 2024-09-13 NOTE — TOC Initial Note (Signed)
 Transition of Care [facility]) - Initial/Assessment Note    Patient Details  Name: [name] MRN: [id number] Date of Birth: 07/13/1966  Transition of Care [facility]) CM/SW Contact:    Lauraine FORBES Saa, LCSWA Phone Number: 09/13/2024, 12:29 PM  Clinical Narrative:                  12:29 PM Per progressions, patient is anticipated to transition to comfort care and have DCD procedure tomorrow. TOC will continue to follow.    Barriers to Discharge: Continued Medical Work up   Patient Goals and CMS Choice            Expected Discharge Plan and Services                                              Prior Living Arrangements/Services     Patient language and need for interpreter reviewed:: Yes        Need for Family Participation in Patient Care: Yes (Comment)     Criminal Activity/Legal Involvement Pertinent to Current Situation/Hospitalization: No - Comment as needed  Activities of Daily Living      Permission Sought/Granted Permission sought to share information with : Family Supports Permission granted to share information with : No (Contact information on chart)  Share Information with NAME: Ennio Houp     Permission granted to share info w Relationship: Uncle  Permission granted to share info w Contact Information: [phone]  Emotional Assessment   Attitude/Demeanor/Rapport: Unable to Assess, Intubated (Following Commands or Not Following Commands) Affect (typically observed): Unable to Assess   Alcohol / Substance Use: Not Applicable Psych Involvement: No (comment)  Admission diagnosis:  Cardiac arrest [facility]) [I46.9] Patient Active Problem List   Diagnosis Date Noted   Cardiac arrest (HCC) 09/08/2024   Goals of care, counseling/discussion 09/08/2024   Seizure (HCC) 09/08/2024   Acute hypoxemic respiratory failure (HCC) 09/08/2024   On mechanically assisted ventilation (HCC) 09/08/2024   Cardiogenic shock (HCC) 09/08/2024   LV  (left ventricular) mural thrombus 09/08/2024   Alcohol abuse with intoxication 09/08/2024   AKI (acute kidney injury) 11/02/2022   Chronic combined systolic and diastolic congestive heart failure (HCC) 11/02/2022   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   Hiatal hernia    Adenomatous polyp of ascending colon    Adenomatous polyp of transverse colon    Grade I internal hemorrhoids    Diverticulosis of colon without hemorrhage    Folate deficiency 06/30/2022   Acute congestive heart failure (HCC) 06/30/2022   Elevated troponin 06/30/2022   Left upper lobe pneumonia 06/27/2022   Iron deficiency anemia 06/26/2022   Acute systolic heart failure (HCC)    Localized swelling of right lower extremity 06/24/2022   HLD (hyperlipidemia) 06/24/2022   Microcytic anemia 06/24/2022   Hypomagnesemia 06/24/2022   Acute pancreatitis 04/04/2020   Forearm fracture, right, closed, initial encounter 12/31/2016   Hypokalemia 12/31/2016   Closed displaced comminuted fracture of shaft of right radius    Fall at home, initial encounter    Hyponatremia 08/22/2015   GERD (gastroesophageal reflux disease) 08/22/2015   Tobacco abuse 08/22/2015   ETOH abuse 08/22/2015   Chest pain at rest 08/22/2015   Type 2 diabetes mellitus with hyperlipidemia (HCC)    Pancreatitis 01/05/2013   CAD (coronary artery disease) 01/05/2013   Hypertension 01/05/2013   Hyperglycemia 01/05/2013   Elevated  LFTs 01/05/2013   PCP:  Salman, Faiza, MD Pharmacy:   [facility] [phone] [address], [place] - [phone] [place] [address] Phone: [phone] Fax: [phone]     Social Drivers of Health (SDOH) Social History: SDOH Screenings   Food Insecurity: Unknown (09/09/2024)  Housing: Low Risk  (11/03/2022)  Transportation Needs: No Transportation Needs (11/03/2022)  Utilities: Not At Risk (11/03/2022)  Tobacco Use: High Risk (09/08/2024)   SDOH Interventions:     Readmission Risk Interventions    11/04/2022    12:50 PM  Readmission Risk Prevention Plan  HRI or Home Care Consult Complete  Social Work Consult for Recovery Care Planning/Counseling Complete  Palliative Care Screening Not Applicable

## 2024-09-13 NOTE — Progress Notes (Signed)
 11am ABG result on 100% FiO2 7.41/29.9/537/19.4 Pt placed back on 35% FiO2 after ABG pulled

## 2024-09-13 NOTE — Progress Notes (Signed)
 7am ABG result on 100% 7.43/28.2/469/19 Pt placed back on 35% after ABG pulled

## 2024-09-13 NOTE — Progress Notes (Signed)
 Pt to and from CT on vent without difficulty

## 2024-09-13 NOTE — Progress Notes (Signed)
 NAME:  [name], MRN:  [id number], DOB:  07-04-1966, LOS: 5 ADMISSION DATE:  09/08/2024, CONSULTATION DATE:  09/08/2024 CHIEF COMPLAINT:  Cardiac arrest (OOH)  Summary  [name] is a 58 year old male with past medical history significant for multivessel CAD, HTN, HLD, type 2 DM who presented via EMS following cardiac arrest. Patient was found down in gas station bathroom for approx 33 minutes. ROSC achieved after 18 minutes of CPR, 1 Epi, 50 mcg Fentanyl , 500ml NS. Patient arrived via EMS intubated. In ED, patient received Narcan  with no improvement in neurological status, an arterial line was placed for hypotension, and patient was started on vasopressors. PCCM consulted for ICU evaluation post-cardiac arrest. Found patient to have frequent myoclonus, started propofol  and loaded with Keppra  and valproate.  Pertinent  Medical History   Past Medical History:  Diagnosis Date   Acid reflux    Concussion    Coronary artery disease    Diabetes (HCC)    ETOH abuse    High cholesterol    Hypertension    MI (myocardial infarction) (HCC)    2012   Sleep apnea    USES CPAP AS NEEDED   Tobacco abuse     Significant Hospital Events: Including procedures, antibiotic start and stop dates in addition to other pertinent events   11/20: Cardiac arrest, intubation, myoclonus observed, propofol  + Keppra  + valproate loaded. 11/21: Rhythmic bilateral foot movements observed. 11/22: Start propofol  wean while assessing for return of myoclonus. 11/23: Awaiting MRI. 11/24: MRI confirms HIE  Interim History / Subjective:  No overnight issues.   Objective    Blood pressure 117/68, pulse 68, temperature (!) 97.2 F (36.2 C), resp. rate (!) 22, height 5' 11 (1.803 m), weight 72.5 kg, SpO2 100%.    Vent Mode: PRVC FiO2 (%):  [35 %-40 %] 35 % Set Rate:  [13 bmp-20 bmp] 13 bmp Vt Set:  [450 mL-600 mL] 450 mL PEEP:  [5 cmH20] 5 cmH20 Plateau Pressure:  [12 cmH20-20 cmH20] 12 cmH20    Intake/Output Summary (Last 24 hours) at 09/13/2024 1025 Last data filed at 09/13/2024 0800 Gross per 24 hour  Intake 2321.88 ml  Output 975 ml  Net 1346.88 ml   Filed Weights   09/12/24 0500 09/12/24 1209 09/13/24 0500  Weight: 70.5 kg 72.9 kg 72.5 kg    Examination: Gen:      Intubated, sedated, not responsive HEENT:  ETT to vent, minimal secretions Lungs:    sounds of mechanical ventilation auscultated no wheeze CV:         RRR Abd:      + bowel sounds; soft, non-tender; no palpable masses, no distension Neuro:   sedated, not responsive   Assessment and Plan   # S/p cardiac arrest # Myoclonus # Acute hypoxemic respiratory failure # Metabolic acidosis, non-gap # Early LV thrombus # Acute systolic heart failure (post-arrest)  NEURO: S/p cardiac arrest. MRI confirms hypoxic ischemic encephalopathy. Family updated. Cousin Alicia is coming in Wednesday night from [place] and would like to transition to comfort measures when she arrives.   COR:  Acute systolic heart failure with possible LV thrombus. Continue heparin  gtt. Repeat echo per [facility]  PULM: Continue lung protective ventilation. Complete 7 days ceftriaxone  for aspiration pna  GI: continue tube feeds  TOX: Alcohol intoxication positive on admission. continue MV/Thiamine . Utox negative.   DVT PPX: continue Heparin  drip. GI PPX: continue Famotidine . Code Status: DNR/Do Not Re-intubate. [facility] bridge evaluation underway. Plan to  transition to comfort measures upon arrival of family from out of state.   Labs   CBC: Recent Labs  Lab 09/10/24 0305 09/11/24 0309 09/12/24 0537 09/12/24 2300 09/12/24 2336 09/13/24 0142 09/13/24 0440 09/13/24 0447 09/13/24 0751  WBC 8.5 6.0 6.3 6.4  --   --   --  6.5  --   HGB 9.8* 11.0* 10.5* 10.1* 14.6 12.6* 9.5* 9.4* 8.8*  HCT 27.4* 31.1* 28.8* 27.9* 43.0 37.0* 28.0* 27.3* 26.0*  MCV 95.8 96.3 94.7 94.9  --   --   --  99.6  --   PLT 147* 134* 159 175  --   --   --   175  --     Basic Metabolic Panel: Recent Labs  Lab 09/10/24 0305 09/11/24 0309 09/12/24 0537 09/12/24 2300 09/12/24 2336 09/13/24 0142 09/13/24 0440 09/13/24 0447 09/13/24 0751  NA 133* 136 133* 133* 132* 133* 132* 133* 131*  K 3.4* 3.3* 3.3* 3.5 3.6 3.7 3.9 3.9 3.8  CL 98 97* 102 103  --   --   --  103  --   CO2 19* 21* 16* 18*  --   --   --  19*  --   GLUCOSE 213* 115* 169* 204*  --   --   --  263*  --   BUN 10 12 15 14   --   --   --  15  --   CREATININE 1.10 0.94 1.00 0.96  --   --   --  1.04  --   CALCIUM  8.5* 8.9 8.4* 8.3*  --   --   --  8.4*  --   MG 1.9 1.8 1.8 1.9  --   --   --  1.9  --   PHOS 2.1* 2.3* 5.0* 4.6  --   --   --  4.9*  --    GFR: Estimated Creatinine Clearance: 80.4 mL/min (by C-G formula based on SCr of 1.04 mg/dL). Recent Labs  Lab 09/08/24 1050 09/08/24 2107 09/09/24 0425 09/11/24 0309 09/12/24 0537 09/12/24 2300 09/13/24 0447  WBC  --   --    < > 6.0 6.3 6.4 6.5  LATICACIDVEN 10.5* 1.3  --   --   --   --   --    < > = values in this interval not displayed.    Liver Function Tests: Recent Labs  Lab 09/08/24 1049 09/09/24 0425 09/12/24 2300 09/13/24 0447  AST 247* 115* 81* 76*  ALT 67* 57* 22 20  ALKPHOS 61 54 61 66  BILITOT 0.8 1.4* 0.6 0.2  PROT 6.5 6.4* 6.0* 5.7*  ALBUMIN  3.1* 2.9* 2.4* 2.3*   Recent Labs  Lab 09/12/24 2300 09/13/24 0447  LIPASE 24 22  AMYLASE 41 40   No results for input(s): AMMONIA in the last 168 hours.  ABG    Component Value Date/Time   PHART 7.432 09/13/2024 0751   PCO2ART 28.2 (L) 09/13/2024 0751   PO2ART 469 (H) 09/13/2024 0751   HCO3 19.0 (L) 09/13/2024 0751   TCO2 20 (L) 09/13/2024 0751   ACIDBASEDEF 5.0 (H) 09/13/2024 0751   O2SAT 100 09/13/2024 0751     Coagulation Profile: Recent Labs  Lab 09/08/24 1049 09/12/24 2318 09/13/24 0447  INR 1.1 1.1 1.0    Cardiac Enzymes: Recent Labs  Lab 09/12/24 2300  CKTOTAL 143  CKMB 1.1    HbA1C: Hgb A1c MFr Bld  Date/Time Value  Ref Range Status  09/08/2024 01:43 PM 7.8 (  H) 4.8 - 5.6 % Final    Comment:    (NOTE) Diagnosis of Diabetes The following HbA1c ranges recommended by the American Diabetes Association (ADA) may be used as an aid in the diagnosis of diabetes mellitus.  Hemoglobin             Suggested A1C NGSP%              Diagnosis  <5.7                   Non Diabetic  5.7-6.4                Pre-Diabetic  >6.4                   Diabetic  <7.0                   Glycemic control for                       adults with diabetes.    07/03/2022 07:35 PM 7.9 (H) 4.8 - 5.6 % Final    Comment:    (NOTE) Pre diabetes:          5.7%-6.4%  Diabetes:              >6.4%  Glycemic control for   <7.0% adults with diabetes     CBG: Recent Labs  Lab 09/12/24 1534 09/12/24 1953 09/12/24 2343 09/13/24 0347 09/13/24 0726  GLUCAP 179* 210* 204* 187* 207*   The patient is critically ill due to cardiac arrest, encephalopathy.  Critical care was necessary to treat or prevent imminent or life-threatening deterioration.  Critical care was time spent personally by me on the following activities: development of treatment plan with patient and/or surrogate as well as nursing, discussions with consultants, evaluation of patient's response to treatment, examination of patient, obtaining history from patient or surrogate, ordering and performing treatments and interventions, ordering and review of laboratory studies, ordering and review of radiographic studies, pulse oximetry, re-evaluation of patient's condition and participation in multidisciplinary rounds.   Critical care was time spent personally by me on the following activities: mechanical ventilation management and titration and end of life planning conversations , development of treatment plan with patient and/or surrogate as well as nursing, discussions with consultants, evaluation of patient's response to treatment, examination of patient, obtaining history  from patient or surrogate, ordering and performing treatments and interventions, ordering and review of laboratory studies, ordering and review of radiographic studies, pulse oximetry, re-evaluation of patient's condition and participation in multidisciplinary rounds.  Critical Care Time devoted to patient care services described in this note is 34 minutes. This time reflects time of care of this signee Daly Whipkey S [place] . This critical care time does not reflect separately billable procedures or procedure time, teaching time or supervisory time of PA/NP/Med student/Med Resident etc but could involve care discussion time.       Verdon GORMAN Meade [facility] 09/13/2024 10:25 AM  Pager: see AMION  If no response to pager , please call critical care on call (see AMION) until 7pm After 7:00 pm call Elink

## 2024-09-13 NOTE — Procedures (Signed)
 Bronchoscopy Procedure Note  [name]  [id number]  11-17-1965  Date:09/13/24  Time:2:43 PM   Provider Performing:Alexina Niccoli S Meade   Procedure(s):  Flexible bronchoscopy with bronchial alveolar lavage (68375)  Indication(s) Donor bronchoscopy  Consent Risks of the procedure as well as the alternatives and risks of each were explained to the patient and/or caregiver.  Consent for the procedure was obtained and is signed in the bedside chart  Anesthesia Propofol    Time Out Verified patient identification, verified procedure, site/side was marked, verified correct patient position, special equipment/implants available, medications/allergies/relevant history reviewed, required imaging and test results available.   Sterile Technique Usual hand hygiene, masks, gowns, and gloves were used   Procedure Description Bronchoscope advanced through endotracheal tube and into airway.  Airways were examined down to subsegmental level with findings noted below.   Following diagnostic evaluation, BAL(s) performed in right upper lobe with 75 with normal saline and return of 35 fluid  Findings: clear thick secretions, normal bronchial anatomy   Complications/Tolerance None; patient tolerated the procedure well. Chest X-ray is not needed post procedure.   EBL Minimal   Specimen(s) BAL sent for studies per honor bridge

## 2024-09-13 NOTE — Plan of Care (Signed)
  Problem: Education: Goal: Ability to manage disease process will improve Outcome: Progressing   Problem: Cardiac: Goal: Ability to achieve and maintain adequate cardiopulmonary perfusion will improve Outcome: Progressing   Problem: Skin Integrity: Goal: Risk for impaired skin integrity will be minimized. Outcome: Progressing   Problem: Clinical Measurements: Goal: Ability to maintain clinical measurements within normal limits will improve Outcome: Progressing   Problem: Nutrition: Goal: Adequate nutrition will be maintained Outcome: Progressing   Problem: Elimination: Goal: Will not experience complications related to bowel motility Outcome: Progressing   Problem: Pain Managment: Goal: General experience of comfort will improve and/or be controlled Outcome: Progressing

## 2024-09-13 NOTE — Progress Notes (Signed)
 eLink Physician-Brief Progress Note Patient Name: [name] DOB: Oct 19, 1966 MRN: [id number]   Date of Service  09/13/2024  HPI/Events of Note  RT increased FiO2 to 100% and indicated that [employer] asked him to do so, [employer] did not communicate with the bedside RN or E-Link. Patients PO2 on 40% was 200.  eICU Interventions  Given that patient is not brain dead and is also not Comfort Measures he is currently an active patient of the critical care service. FiO2 continued at 40 % pending clarification of his status or documentation of a rational for the increase in the FiO2 in the chart.        Lem Peary U Celinda Dethlefs 09/13/2024, 6:41 AM

## 2024-09-13 NOTE — Progress Notes (Signed)
 eLink Physician-Brief Progress Note Patient Name: Javarion Douty DOB: 1966-05-01 MRN: [id number]   Date of Service  09/13/2024  HPI/Events of Note  ABG improved but patient remains alkalotic.  eICU Interventions  Respiratory rate reduced to 13 and ABG check in one hour.        Rigoberto Repass U Zailyn Thoennes 09/13/2024, 2:16 AM

## 2024-09-13 NOTE — Progress Notes (Signed)
 eLink Physician-Brief Progress Note Patient Name: [name] DOB: 25-Mar-1966 MRN: [id number]   Date of Service  09/13/2024  HPI/Events of Note  Patient with a mixed acid -base disorder with a strong respiratory component.  eICU Interventions  Vt reduced to 6 ml /kg and rate reduced to 15 from 20, ABG in one hour.        Ercell Razon U Abbigael Detlefsen 09/13/2024, 12:03 AM

## 2024-09-13 NOTE — Progress Notes (Signed)
 PHARMACY - ANTICOAGULATION CONSULT NOTE  Pharmacy Consult for Heparin   Indication: Apical thrombus  No Known Allergies  Patient Measurements: Height: 5' 11 (180.3 cm) Weight: 72.5 kg (159 lb 13.3 oz) IBW/kg (Calculated) : 75.3  Vital Signs: Temp: 97.3 F (36.3 C) (11/25 0630) Temp Source: Bladder (11/25 0400) BP: 120/86 (11/25 0000) Pulse Rate: 66 (11/25 0630)  Labs: Recent Labs    09/11/24 0309 09/12/24 0537 09/12/24 2300 09/12/24 2318 09/12/24 2336 09/13/24 0119 09/13/24 0142 09/13/24 0440 09/13/24 0447  HGB 11.0* 10.5* 10.1*  --    < >  --  12.6* 9.5* 9.4*  HCT 31.1* 28.8* 27.9*  --    < >  --  37.0* 28.0* 27.3*  PLT 134* 159 175  --   --   --   --   --  175  APTT  --   --   --  89*  --   --   --   --  117*  LABPROT  --   --   --  14.3  --   --   --   --  14.0  INR  --   --   --  1.1  --   --   --   --  1.0  HEPARINUNFRC 0.43 0.46  --   --   --   --   --   --  0.57  CREATININE 0.94 1.00 0.96  --   --   --   --   --  1.04  CKTOTAL  --   --  143  --   --   --   --   --   --   CKMB  --   --  1.1  --   --   --   --   --   --   TROPONINIHS  --   --  68*  --   --  89*  --   --   --    < > = values in this interval not displayed.    Estimated Creatinine Clearance: 80.4 mL/min (by C-G formula based on SCr of 1.04 mg/dL).   Assessment: [name] is a 58 y.o. year old male admitted on 09/08/2024 with concern for apical thrombus. No anticoagulation prior to admission. Pharmacy consulted to dose heparin .  Heparin  level 0.57, therapeutic  No overt s/sx of bleeding noted. No issues with infusion.   Goal of Therapy:  Heparin  level 0.3-0.7 units/ml Monitor platelets by anticoagulation protocol: Yes   Plan:  Continue heparin  infusion to 1100 units/hr Daily heparin  level, CBC, and monitoring for bleeding F/u plans for anticoagulation and GOC  Thank you for allowing pharmacy to participate in this patient's care.  Rankin Sams, PharmD, BCPS, BCCCP Clinical  Pharmacist

## 2024-09-13 NOTE — Progress Notes (Signed)
 15:00 ABG result on 100% FiO2 7.37/27.6/463/16.3 Pt placed back on 35% FiO2 afetr ABG pulled

## 2024-09-13 NOTE — Progress Notes (Signed)
 PHARMACY - ANTICOAGULATION CONSULT NOTE  Pharmacy Consult for Heparin   Indication: Apical thrombus  No Known Allergies  Patient Measurements: Height: 5' 11 (180.3 cm) Weight: 72.5 kg (159 lb 13.3 oz) IBW/kg (Calculated) : 75.3  Vital Signs: Temp: 99 F (37.2 C) (11/25 1800) BP: 138/81 (11/25 1448) Pulse Rate: 82 (11/25 1800)  Labs: Recent Labs    09/12/24 0537 09/12/24 2300 09/12/24 2318 09/13/24 0119 09/13/24 0142 09/13/24 0447 09/13/24 0751 09/13/24 1119 09/13/24 1456 09/13/24 1717  HGB 10.5* 10.1*   < >  --    < > 9.4*   < > 9.1* 7.1* 9.4*  HCT 28.8* 27.9*   < >  --    < > 27.3*   < > 25.8* 21.0* 27.1*  PLT 159 175  --   --   --  175  --  184  --  182  APTT  --   --    < >  --   --  117*  --  109*  --  156*  LABPROT  --   --    < >  --   --  14.0  --  14.0  --  13.9  INR  --   --    < >  --   --  1.0  --  1.0  --  1.0  HEPARINUNFRC 0.46  --   --   --   --  0.57  --   --   --  0.73*  CREATININE 1.00 0.96  --   --   --  1.04  --  0.77  --  0.76  CKTOTAL  --  143  --   --   --   --   --   --   --   --   CKMB  --  1.1  --   --   --   --   --   --   --   --   TROPONINIHS  --  68*  --  89*  --   --   --   --   --   --    < > = values in this interval not displayed.    Estimated Creatinine Clearance: 104.5 mL/min (by C-G formula based on SCr of 0.76 mg/dL).   Assessment: [name] is a 58 y.o. year old male admitted on 09/08/2024 with concern for apical thrombus. No anticoagulation prior to admission. Pharmacy consulted to dose heparin .  Heparin  level 0.73, supratherapeutic  -aPTT checked by [facility] also supra-therapeutic   No overt s/sx of bleeding noted. No issues with infusion.   Goal of Therapy:  Heparin  level 0.3-0.7 units/ml Monitor platelets by anticoagulation protocol: Yes   Plan:  Decrease heparin  infusion to 950 units/hr Daily heparin  level, CBC, and monitoring for bleeding F/u plans for anticoagulation and GOC  Thank you for  allowing pharmacy to participate in this patient's care.  Sharyne Glatter, PharmD, BCCCP Critical Care Clinical Pharmacist 09/13/2024 6:46 PM

## 2024-09-13 NOTE — Plan of Care (Signed)
  Problem: Education: Goal: Ability to manage disease process will improve Outcome: Not Progressing   Problem: Cardiac: Goal: Ability to achieve and maintain adequate cardiopulmonary perfusion will improve Outcome: Not Progressing   Problem: Neurologic: Goal: Promote progressive neurologic recovery Outcome: Not Progressing   Problem: Skin Integrity: Goal: Risk for impaired skin integrity will be minimized. Outcome: Not Progressing   Problem: Education: Goal: Knowledge of General Education information will improve Description: Including pain rating scale, medication(s)/side effects and non-pharmacologic comfort measures Outcome: Not Progressing   Problem: Health Behavior/Discharge Planning: Goal: Ability to manage health-related needs will improve Outcome: Not Progressing   Problem: Clinical Measurements: Goal: Ability to maintain clinical measurements within normal limits will improve Outcome: Not Progressing Goal: Will remain free from infection Outcome: Not Progressing Goal: Diagnostic test results will improve Outcome: Not Progressing Goal: Respiratory complications will improve Outcome: Not Progressing Goal: Cardiovascular complication will be avoided Outcome: Not Progressing   Problem: Activity: Goal: Risk for activity intolerance will decrease Outcome: Not Progressing   Problem: Nutrition: Goal: Adequate nutrition will be maintained Outcome: Not Progressing   Problem: Elimination: Goal: Will not experience complications related to bowel motility Outcome: Not Progressing Goal: Will not experience complications related to urinary retention Outcome: Not Progressing   Problem: Pain Managment: Goal: General experience of comfort will improve and/or be controlled Outcome: Not Progressing   Problem: Safety: Goal: Ability to remain free from injury will improve Outcome: Not Progressing   Problem: Skin Integrity: Goal: Risk for impaired skin integrity will  decrease Outcome: Not Progressing

## 2024-09-14 ENCOUNTER — Inpatient Hospital Stay (HOSPITAL_COMMUNITY)

## 2024-09-14 LAB — MAGNESIUM
Magnesium: 1.7 mg/dL (ref 1.7–2.4)
Magnesium: 1.8 mg/dL (ref 1.7–2.4)
Magnesium: 1.8 mg/dL (ref 1.7–2.4)

## 2024-09-14 LAB — PROTIME-INR
INR: 0.9 (ref 0.8–1.2)
INR: 1.1 (ref 0.8–1.2)
INR: 1.1 (ref 0.8–1.2)
Prothrombin Time: 13.1 s (ref 11.4–15.2)
Prothrombin Time: 14.3 s (ref 11.4–15.2)
Prothrombin Time: 14.8 s (ref 11.4–15.2)

## 2024-09-14 LAB — CBC
HCT: 28.1 % — ABNORMAL LOW (ref 39.0–52.0)
HCT: 27.7 % — ABNORMAL LOW (ref 39.0–52.0)
HCT: 28.1 % — ABNORMAL LOW (ref 39.0–52.0)
HCT: 28.4 % — ABNORMAL LOW (ref 39.0–52.0)
Hemoglobin: 9.5 g/dL — ABNORMAL LOW (ref 13.0–17.0)
Hemoglobin: 9.3 g/dL — ABNORMAL LOW (ref 13.0–17.0)
Hemoglobin: 9.3 g/dL — ABNORMAL LOW (ref 13.0–17.0)
Hemoglobin: 9.2 g/dL — ABNORMAL LOW (ref 13.0–17.0)
MCH: 33.9 pg (ref 26.0–34.0)
MCH: 34.2 pg — ABNORMAL HIGH (ref 26.0–34.0)
MCH: 34.2 pg — ABNORMAL HIGH (ref 26.0–34.0)
MCH: 33.7 pg (ref 26.0–34.0)
MCHC: 33.8 g/dL (ref 30.0–36.0)
MCHC: 33.6 g/dL (ref 30.0–36.0)
MCHC: 33.1 g/dL (ref 30.0–36.0)
MCHC: 32.4 g/dL (ref 30.0–36.0)
MCV: 100.4 fL — ABNORMAL HIGH (ref 80.0–100.0)
MCV: 101.8 fL — ABNORMAL HIGH (ref 80.0–100.0)
MCV: 103.3 fL — ABNORMAL HIGH (ref 80.0–100.0)
MCV: 104 fL — ABNORMAL HIGH (ref 80.0–100.0)
Platelets: 202 K/uL (ref 150–400)
Platelets: 225 K/uL (ref 150–400)
Platelets: 225 K/uL (ref 150–400)
Platelets: 243 K/uL (ref 150–400)
RBC: 2.8 MIL/uL — ABNORMAL LOW (ref 4.22–5.81)
RBC: 2.72 MIL/uL — ABNORMAL LOW (ref 4.22–5.81)
RBC: 2.72 MIL/uL — ABNORMAL LOW (ref 4.22–5.81)
RBC: 2.73 MIL/uL — ABNORMAL LOW (ref 4.22–5.81)
RDW: 13.1 % (ref 11.5–15.5)
RDW: 13.2 % (ref 11.5–15.5)
RDW: 13.2 % (ref 11.5–15.5)
RDW: 13.3 % (ref 11.5–15.5)
WBC: 7.3 K/uL (ref 4.0–10.5)
WBC: 8.6 K/uL (ref 4.0–10.5)
WBC: 7.5 K/uL (ref 4.0–10.5)
WBC: 6.5 K/uL (ref 4.0–10.5)
nRBC: 0 % (ref 0.0–0.2)
nRBC: 0 % (ref 0.0–0.2)
nRBC: 0 % (ref 0.0–0.2)
nRBC: 0 % (ref 0.0–0.2)

## 2024-09-14 LAB — POCT I-STAT 7, (LYTES, BLD GAS, ICA,H+H)
Acid-base deficit: 4 mmol/L — ABNORMAL HIGH (ref 0.0–2.0)
Acid-base deficit: 4 mmol/L — ABNORMAL HIGH (ref 0.0–2.0)
Acid-base deficit: 3 mmol/L — ABNORMAL HIGH (ref 0.0–2.0)
Acid-base deficit: 2 mmol/L (ref 0.0–2.0)
Acid-base deficit: 1 mmol/L (ref 0.0–2.0)
Bicarbonate: 19.3 mmol/L — ABNORMAL LOW (ref 20.0–28.0)
Bicarbonate: 19.8 mmol/L — ABNORMAL LOW (ref 20.0–28.0)
Bicarbonate: 23.4 mmol/L (ref 20.0–28.0)
Bicarbonate: 25.2 mmol/L (ref 20.0–28.0)
Bicarbonate: 25.2 mmol/L (ref 20.0–28.0)
Calcium, Ion: 1.16 mmol/L (ref 1.15–1.40)
Calcium, Ion: 1.19 mmol/L (ref 1.15–1.40)
Calcium, Ion: 1.23 mmol/L (ref 1.15–1.40)
Calcium, Ion: 1.29 mmol/L (ref 1.15–1.40)
Calcium, Ion: 1.3 mmol/L (ref 1.15–1.40)
HCT: 41 % (ref 39.0–52.0)
HCT: 25 % — ABNORMAL LOW (ref 39.0–52.0)
HCT: 26 % — ABNORMAL LOW (ref 39.0–52.0)
HCT: 28 % — ABNORMAL LOW (ref 39.0–52.0)
HCT: 27 % — ABNORMAL LOW (ref 39.0–52.0)
Hemoglobin: 13.9 g/dL (ref 13.0–17.0)
Hemoglobin: 8.5 g/dL — ABNORMAL LOW (ref 13.0–17.0)
Hemoglobin: 8.8 g/dL — ABNORMAL LOW (ref 13.0–17.0)
Hemoglobin: 9.5 g/dL — ABNORMAL LOW (ref 13.0–17.0)
Hemoglobin: 9.2 g/dL — ABNORMAL LOW (ref 13.0–17.0)
O2 Saturation: 100 %
O2 Saturation: 100 %
O2 Saturation: 99 %
O2 Saturation: 100 %
O2 Saturation: 100 %
Patient temperature: 36.6
Patient temperature: 36.4
Potassium: 4.1 mmol/L (ref 3.5–5.1)
Potassium: 3.7 mmol/L (ref 3.5–5.1)
Potassium: 4.1 mmol/L (ref 3.5–5.1)
Potassium: 4.5 mmol/L (ref 3.5–5.1)
Potassium: 4.2 mmol/L (ref 3.5–5.1)
Sodium: 135 mmol/L (ref 135–145)
Sodium: 136 mmol/L (ref 135–145)
Sodium: 134 mmol/L — ABNORMAL LOW (ref 135–145)
Sodium: 135 mmol/L (ref 135–145)
Sodium: 137 mmol/L (ref 135–145)
TCO2: 20 mmol/L — ABNORMAL LOW (ref 22–32)
TCO2: 21 mmol/L — ABNORMAL LOW (ref 22–32)
TCO2: 25 mmol/L (ref 22–32)
TCO2: 27 mmol/L (ref 22–32)
TCO2: 27 mmol/L (ref 22–32)
pCO2 arterial: 30.4 mmHg — ABNORMAL LOW (ref 32–48)
pCO2 arterial: 30.8 mmHg — ABNORMAL LOW (ref 32–48)
pCO2 arterial: 43.7 mmHg (ref 32–48)
pCO2 arterial: 52.4 mmHg — ABNORMAL HIGH (ref 32–48)
pCO2 arterial: 45.6 mmHg (ref 32–48)
pH, Arterial: 7.409 (ref 7.35–7.45)
pH, Arterial: 7.417 (ref 7.35–7.45)
pH, Arterial: 7.334 — ABNORMAL LOW (ref 7.35–7.45)
pH, Arterial: 7.286 — ABNORMAL LOW (ref 7.35–7.45)
pH, Arterial: 7.351 (ref 7.35–7.45)
pO2, Arterial: 472 mmHg — ABNORMAL HIGH (ref 83–108)
pO2, Arterial: 449 mmHg — ABNORMAL HIGH (ref 83–108)
pO2, Arterial: 134 mmHg — ABNORMAL HIGH (ref 83–108)
pO2, Arterial: 477 mmHg — ABNORMAL HIGH (ref 83–108)
pO2, Arterial: 482 mmHg — ABNORMAL HIGH (ref 83–108)

## 2024-09-14 LAB — HEPATIC FUNCTION PANEL
ALT: 18 U/L (ref 0–44)
ALT: 19 U/L (ref 0–44)
ALT: 19 U/L (ref 0–44)
AST: 62 U/L — ABNORMAL HIGH (ref 15–41)
AST: 64 U/L — ABNORMAL HIGH (ref 15–41)
AST: 56 U/L — ABNORMAL HIGH (ref 15–41)
Albumin: 2.3 g/dL — ABNORMAL LOW (ref 3.5–5.0)
Albumin: 2.4 g/dL — ABNORMAL LOW (ref 3.5–5.0)
Albumin: 2.3 g/dL — ABNORMAL LOW (ref 3.5–5.0)
Alkaline Phosphatase: 69 U/L (ref 38–126)
Alkaline Phosphatase: 69 U/L (ref 38–126)
Alkaline Phosphatase: 69 U/L (ref 38–126)
Bilirubin, Direct: 0.1 mg/dL (ref 0.0–0.2)
Bilirubin, Direct: <0.1 mg/dL (ref 0.0–0.2)
Bilirubin, Direct: <0.1 mg/dL (ref 0.0–0.2)
Total Bilirubin: <0.2 mg/dL (ref 0.0–1.2)
Total Bilirubin: 0.4 mg/dL (ref 0.0–1.2)
Total Bilirubin: 0.3 mg/dL (ref 0.0–1.2)
Total Protein: 6 g/dL — ABNORMAL LOW (ref 6.5–8.1)
Total Protein: 6.2 g/dL — ABNORMAL LOW (ref 6.5–8.1)
Total Protein: 6.2 g/dL — ABNORMAL LOW (ref 6.5–8.1)

## 2024-09-14 LAB — BASIC METABOLIC PANEL WITH GFR
Anion gap: 11 (ref 5–15)
Anion gap: 9 (ref 5–15)
Anion gap: 8 (ref 5–15)
BUN: 14 mg/dL (ref 6–20)
BUN: 11 mg/dL (ref 6–20)
BUN: 11 mg/dL (ref 6–20)
CO2: 19 mmol/L — ABNORMAL LOW (ref 22–32)
CO2: 20 mmol/L — ABNORMAL LOW (ref 22–32)
CO2: 22 mmol/L (ref 22–32)
Calcium: 8.5 mg/dL — ABNORMAL LOW (ref 8.9–10.3)
Calcium: 8.6 mg/dL — ABNORMAL LOW (ref 8.9–10.3)
Calcium: 8.8 mg/dL — ABNORMAL LOW (ref 8.9–10.3)
Chloride: 102 mmol/L (ref 98–111)
Chloride: 105 mmol/L (ref 98–111)
Chloride: 106 mmol/L (ref 98–111)
Creatinine, Ser: 0.88 mg/dL (ref 0.61–1.24)
Creatinine, Ser: 0.83 mg/dL (ref 0.61–1.24)
Creatinine, Ser: 0.79 mg/dL (ref 0.61–1.24)
GFR, Estimated: >60 mL/min (ref >60)
GFR, Estimated: >60 mL/min
GFR, Estimated: >60 mL/min (ref >60)
Glucose, Bld: 268 mg/dL — ABNORMAL HIGH (ref 70–99)
Glucose, Bld: 250 mg/dL — ABNORMAL HIGH (ref 70–99)
Glucose, Bld: 235 mg/dL — ABNORMAL HIGH (ref 70–99)
Potassium: 4.2 mmol/L (ref 3.5–5.1)
Potassium: 4.2 mmol/L (ref 3.5–5.1)
Potassium: 4.4 mmol/L (ref 3.5–5.1)
Sodium: 132 mmol/L — ABNORMAL LOW (ref 135–145)
Sodium: 134 mmol/L — ABNORMAL LOW (ref 135–145)
Sodium: 136 mmol/L (ref 135–145)

## 2024-09-14 LAB — URINALYSIS, ROUTINE W REFLEX MICROSCOPIC
Bacteria, UA: NONE SEEN
Bilirubin Urine: NEGATIVE
Glucose, UA: >=500 mg/dL — AB
Hgb urine dipstick: NEGATIVE
Ketones, ur: NEGATIVE mg/dL
Leukocytes,Ua: NEGATIVE
Nitrite: NEGATIVE
Protein, ur: NEGATIVE mg/dL
Specific Gravity, Urine: 1.022 (ref 1.005–1.030)
pH: 5 (ref 5.0–8.0)

## 2024-09-14 LAB — GLUCOSE, CAPILLARY
Glucose-Capillary: 178 mg/dL — ABNORMAL HIGH (ref 70–99)
Glucose-Capillary: 180 mg/dL — ABNORMAL HIGH (ref 70–99)
Glucose-Capillary: 199 mg/dL — ABNORMAL HIGH (ref 70–99)
Glucose-Capillary: 214 mg/dL — ABNORMAL HIGH (ref 70–99)
Glucose-Capillary: 247 mg/dL — ABNORMAL HIGH (ref 70–99)
Glucose-Capillary: 253 mg/dL — ABNORMAL HIGH (ref 70–99)

## 2024-09-14 LAB — PHOSPHORUS
Phosphorus: 3.1 mg/dL (ref 2.5–4.6)
Phosphorus: 3.7 mg/dL (ref 2.5–4.6)
Phosphorus: 3.7 mg/dL (ref 2.5–4.6)

## 2024-09-14 LAB — FIBRINOGEN
Fibrinogen: 775 mg/dL — ABNORMAL HIGH (ref 210–475)
Fibrinogen: 739 mg/dL — ABNORMAL HIGH (ref 210–475)
Fibrinogen: 757 mg/dL — ABNORMAL HIGH (ref 210–475)

## 2024-09-14 LAB — URINE CULTURE: Culture: NO GROWTH

## 2024-09-14 LAB — AMYLASE
Amylase: 44 U/L (ref 28–100)
Amylase: 41 U/L (ref 28–100)
Amylase: 33 U/L (ref 28–100)

## 2024-09-14 LAB — APTT
aPTT: 46 s — ABNORMAL HIGH (ref 24–36)
aPTT: 80 s — ABNORMAL HIGH (ref 24–36)
aPTT: 97 s — ABNORMAL HIGH (ref 24–36)

## 2024-09-14 LAB — LIPASE, BLOOD
Lipase: 23 U/L (ref 11–51)
Lipase: 21 U/L (ref 11–51)
Lipase: 19 U/L (ref 11–51)

## 2024-09-14 LAB — HEPARIN LEVEL (UNFRACTIONATED): Heparin Unfractionated: 0.32 [IU]/mL (ref 0.30–0.70)

## 2024-09-14 MED ORDER — MORPHINE 100MG IN NS 100ML (1MG/ML) PREMIX INFUSION
0.0000 mg/h | INTRAVENOUS | Status: DC
  Administered 2024-09-14: 5 mg/h via INTRAVENOUS
  Administered 2024-09-14 – 2024-09-16 (×4): 8 mg/h via INTRAVENOUS
  Filled 2024-09-14 (×5): qty 100

## 2024-09-14 MED ORDER — MIDAZOLAM-SODIUM CHLORIDE 100-0.9 MG/100ML-% IV SOLN
0.0000 mg/h | INTRAVENOUS | Status: DC
  Administered 2024-09-14: 2 mg/h via INTRAVENOUS
  Administered 2024-09-14 – 2024-09-15 (×3): 6 mg/h via INTRAVENOUS
  Filled 2024-09-14 (×4): qty 100

## 2024-09-14 MED ORDER — MIDAZOLAM BOLUS VIA INFUSION
0.0000 mg | INTRAVENOUS | Status: DC | PRN
  Administered 2024-09-14: 4 mg via INTRAVENOUS
  Administered 2024-09-14: 2 mg via INTRAVENOUS

## 2024-09-14 NOTE — Plan of Care (Signed)
  Problem: Cardiac: Goal: Ability to achieve and maintain adequate cardiopulmonary perfusion will improve Outcome: Progressing   Problem: Clinical Measurements: Goal: Will remain free from infection Outcome: Progressing   Problem: Clinical Measurements: Goal: Diagnostic test results will improve Outcome: Progressing   Problem: Nutrition: Goal: Adequate nutrition will be maintained Outcome: Progressing   Problem: Pain Managment: Goal: General experience of comfort will improve and/or be controlled Outcome: Progressing   Problem: Skin Integrity: Goal: Risk for impaired skin integrity will decrease Outcome: Progressing

## 2024-09-14 NOTE — Progress Notes (Signed)
 eLink Physician-Brief Progress Note Patient Name: [name] DOB: 01-04-1966 MRN: [id number]   Date of Service  09/14/2024  HPI/Events of Note  Pt with tube feeds coming out of mouth.  VS, SpO2 stable.   eICU Interventions  Feeding held.  Will check CXR        Jaiyah Beining M DELA CRUZ 09/14/2024, 3:58 AM

## 2024-09-14 NOTE — Progress Notes (Signed)
 PHARMACY - ANTICOAGULATION CONSULT NOTE  Pharmacy Consult for Heparin   Indication: Apical thrombus  No Known Allergies  Patient Measurements: Height: 5' 11 (180.3 cm) Weight: 72.5 kg (159 lb 13.3 oz) IBW/kg (Calculated) : 75.3  Vital Signs: Temp: 97.7 F (36.5 C) (11/26 1145) Temp Source: Bladder (11/26 0800) Pulse Rate: 66 (11/26 1145)  Labs: Recent Labs    09/12/24 2300 09/12/24 2318 09/13/24 0119 09/13/24 0142 09/13/24 0447 09/13/24 0751 09/13/24 1717 09/13/24 2011 09/13/24 2255 09/13/24 2350 09/14/24 0443 09/14/24 0444 09/14/24 0638 09/14/24 1042 09/14/24 1051  HGB 10.1*   < >  --    < > 9.4*   < > 9.4*   < > 9.4*   < >  --  9.5* 8.5* 8.8* 9.3*  HCT 27.9*   < >  --    < > 27.3*   < > 27.1*   < > 27.5*   < >  --  28.1* 25.0* 26.0* 27.7*  PLT 175  --   --   --  175   < > 182  --  183  --   --  202  --   --  225  APTT  --    < >  --   --  117*   < > 156*  --  91*  --   --  46*  --   --   --   LABPROT  --    < >  --   --  14.0   < > 13.9  --  13.8  --   --  13.1  --   --   --   INR  --    < >  --   --  1.0   < > 1.0  --  1.0  --   --  0.9  --   --   --   HEPARINUNFRC  --   --   --   --  0.57  --  0.73*  --   --   --  0.32  --   --   --   --   CREATININE 0.96  --   --   --  1.04   < > 0.76  --  0.80  --   --  0.88  --   --   --   CKTOTAL 143  --   --   --   --   --   --   --   --   --   --   --   --   --   --   CKMB 1.1  --   --   --   --   --   --   --   --   --   --   --   --   --   --   TROPONINIHS 68*  --  89*  --   --   --   --   --   --   --   --   --   --   --   --    < > = values in this interval not displayed.    Estimated Creatinine Clearance: 95 mL/min (by C-G formula based on SCr of 0.88 mg/dL).   Assessment: Hasan Dionel Archey is a 58 y.o. year old male admitted on 09/08/2024 with concern for apical thrombus. No anticoagulation prior to admission. Pharmacy consulted to dose heparin .  Heparin  level 0.32,  lower end of therapeutic.  No overt s/sx  of bleeding noted. No issues with infusion.   Goal of Therapy:  Heparin  level 0.3-0.7 units/ml Monitor platelets by anticoagulation protocol: Yes   Plan:  Increase heparin  infusion to 1000 units/hr Daily heparin  level, CBC, and monitoring for bleeding F/u plans for anticoagulation and GOC  Thank you for allowing pharmacy to participate in this patient's care.  Simpson Paulos, PharmD

## 2024-09-14 NOTE — Procedures (Deleted)
 Extubation Procedure Note  Patient Details:   Name: [name] DOB: Aug 16, 1966 MRN: [id number]   Airway Documentation:    Vent end date: 09/14/24 Vent end time: 1311   Evaluation  O2 sats: stable throughout and currently acceptable Complications: No apparent complications Patient did tolerate procedure well. Bilateral Breath Sounds: Diminished   No Pt extubated to 4L  per MD order. Positive cuff leak noted.  Leontine Murtis GAILS 09/14/2024, 1:13 PM

## 2024-09-14 NOTE — Progress Notes (Signed)
 NAME:  [name], MRN:  [id number], DOB:  1966-10-06, LOS: 6 ADMISSION DATE:  09/08/2024, CONSULTATION DATE:  09/08/2024 CHIEF COMPLAINT:  Cardiac arrest (OOH)  Summary  [name] is a 58 year old male with past medical history significant for multivessel CAD, HTN, HLD, type 2 DM who presented via EMS following cardiac arrest. Patient was found down in gas station bathroom for approx 33 minutes. ROSC achieved after 18 minutes of CPR, 1 Epi, 50 mcg Fentanyl , 500ml NS. Patient arrived via EMS intubated. In ED, patient received Narcan  with no improvement in neurological status, an arterial line was placed for hypotension, and patient was started on vasopressors. PCCM consulted for ICU evaluation post-cardiac arrest. Found patient to have frequent myoclonus, started propofol  and loaded with Keppra  and valproate.  Pertinent  Medical History   Past Medical History:  Diagnosis Date   Acid reflux    Concussion    Coronary artery disease    Diabetes (HCC)    ETOH abuse    High cholesterol    Hypertension    MI (myocardial infarction) (HCC)    2012   Sleep apnea    USES CPAP AS NEEDED   Tobacco abuse     Significant Hospital Events: Including procedures, antibiotic start and stop dates in addition to other pertinent events   11/20: Cardiac arrest, intubation, myoclonus observed, propofol  + Keppra  + valproate loaded. 11/21: Rhythmic bilateral foot movements observed. 11/22: Start propofol  wean while assessing for return of myoclonus. 11/23: Awaiting MRI. 11/24: MRI confirms HIE  Interim History / Subjective:  No overnight issues. Tachypnic with RR in the 19-20 range this am.   Objective    Blood pressure 138/81, pulse 69, temperature 97.9 F (36.6 C), resp. rate 14, height 5' 11 (1.803 m), weight 72.5 kg, SpO2 100%.    Vent Mode: PRVC FiO2 (%):  [35 %-40 %] 35 % Set Rate:  [13 bmp] 13 bmp Vt Set:  [450 mL] 450 mL PEEP:  [5 cmH20] 5 cmH20 Pressure Support:  [15  cmH20] 15 cmH20 Plateau Pressure:  [20 cmH20] 20 cmH20   Intake/Output Summary (Last 24 hours) at 09/14/2024 1042 Last data filed at 09/14/2024 0900 Gross per 24 hour  Intake 2083.93 ml  Output 2300 ml  Net -216.07 ml   Filed Weights   09/12/24 0500 09/12/24 1209 09/13/24 0500  Weight: 70.5 kg 72.9 kg 72.5 kg    Examination: Intubated, sedated, not responsive.    Assessment and Plan   # S/p cardiac arrest # Myoclonus # Acute hypoxemic respiratory failure # Metabolic acidosis, non-gap # Early LV thrombus # Acute systolic heart failure (post-arrest)  NEURO: S/p cardiac arrest. MRI confirms hypoxic ischemic encephalopathy. Family updated. Cousin Alicia is coming in Wednesday night from [place] and would like to transition to comfort measures when she arrives. Given his tachypnia and respiratory distress, additional medications were added this morning - versed  and morphine  to help with comfort and air hunger.   COR:  Acute systolic heart failure with possible LV thrombus. Continue heparin  gtt. Now with some circulatory shock likely due to additional sedation medications.  PULM: Continue lung protective ventilation. Complete 7 days ceftriaxone  for aspiration pna  GI: continue tube feeds  TOX: Alcohol intoxication positive on admission. continue MV/Thiamine . Utox negative.   DVT PPX: continue Heparin  drip. GI PPX: continue Famotidine . Code Status: DNR/Do Not Re-intubate. [facility] bridge evaluation underway. Plan to transition to comfort measures upon arrival of family from out of state. [facility] bridge following.  Labs   CBC: Recent Labs  Lab 09/13/24 0447 09/13/24 0751 09/13/24 1119 09/13/24 1456 09/13/24 1717 09/13/24 2011 09/13/24 2255 09/13/24 2350 09/14/24 0332 09/14/24 0444 09/14/24 0638  WBC 6.5  --  5.9  --  6.8  --  7.4  --   --  7.3  --   HGB 9.4*   < > 9.1*   < > 9.4*   < > 9.4* 7.8* 13.9 9.5* 8.5*  HCT 27.3*   < > 25.8*   < > 27.1*   < > 27.5* 23.0* 41.0  28.1* 25.0*  MCV 99.6  --  99.2  --  99.3  --  100.0  --   --  100.4*  --   PLT 175  --  184  --  182  --  183  --   --  202  --    < > = values in this interval not displayed.    Basic Metabolic Panel: Recent Labs  Lab 09/13/24 0447 09/13/24 0751 09/13/24 1119 09/13/24 1456 09/13/24 1717 09/13/24 2011 09/13/24 2255 09/13/24 2350 09/14/24 0332 09/14/24 0444 09/14/24 0638  NA 133*   < > 131*   < > 131*   < > 131* 135 135 132* 136  K 3.9   < > 3.8   < > 3.8   < > 3.8 3.7 4.1 4.2 3.7  CL 103  --  102  --  101  --  102  --   --  102  --   CO2 19*  --  19*  --  19*  --  20*  --   --  19*  --   GLUCOSE 263*  --  174*  --  200*  --  230*  --   --  268*  --   BUN 15  --  16  --  16  --  14  --   --  14  --   CREATININE 1.04  --  0.77  --  0.76  --  0.80  --   --  0.88  --   CALCIUM  8.4*  --  8.4*  --  8.3*  --  8.4*  --   --  8.5*  --   MG 1.9  --  1.9  --  1.7  --  1.8  --   --  1.7  --   PHOS 4.9*  --  4.2  --  3.9  --  3.1  --   --  3.1  --    < > = values in this interval not displayed.   GFR: Estimated Creatinine Clearance: 95 mL/min (by C-G formula based on SCr of 0.88 mg/dL). Recent Labs  Lab 09/08/24 1050 09/08/24 2107 09/09/24 0425 09/13/24 1119 09/13/24 1717 09/13/24 2255 09/14/24 0444  WBC  --   --    < > 5.9 6.8 7.4 7.3  LATICACIDVEN 10.5* 1.3  --   --   --   --   --    < > = values in this interval not displayed.    Liver Function Tests: Recent Labs  Lab 09/13/24 0447 09/13/24 1119 09/13/24 1717 09/13/24 2255 09/14/24 0444  AST 76* 73* 71* 67* 62*  ALT 20 19 20 19 18   ALKPHOS 66 64 64 67 69  BILITOT 0.2 0.4 0.3 0.2 <0.2  PROT 5.7* 6.0* 6.0* 6.1* 6.0*  ALBUMIN  2.3* 2.3* 2.3* 2.3* 2.3*   Recent Labs  Lab 09/13/24 0447 09/13/24  1119 09/13/24 1717 09/13/24 2255 09/14/24 0444  LIPASE 22 23 23 22 23   AMYLASE 40 38 40 40 44   No results for input(s): AMMONIA in the last 168 hours.  ABG    Component Value Date/Time   PHART 7.417 09/14/2024  0638   PCO2ART 30.8 (L) 09/14/2024 0638   PO2ART 449 (H) 09/14/2024 0638   HCO3 19.8 (L) 09/14/2024 0638   TCO2 21 (L) 09/14/2024 0638   ACIDBASEDEF 4.0 (H) 09/14/2024 0638   O2SAT 100 09/14/2024 0638     Coagulation Profile: Recent Labs  Lab 09/13/24 0447 09/13/24 1119 09/13/24 1717 09/13/24 2255 09/14/24 0444  INR 1.0 1.0 1.0 1.0 0.9    Cardiac Enzymes: Recent Labs  Lab 09/12/24 2300  CKTOTAL 143  CKMB 1.1    HbA1C: Hgb A1c MFr Bld  Date/Time Value Ref Range Status  09/12/2024 11:00 PM 7.8 (H) 4.8 - 5.6 % Final    Comment:    (NOTE)         Prediabetes: 5.7 - 6.4         Diabetes: >6.4         Glycemic control for adults with diabetes: <7.0   09/08/2024 01:43 PM 7.8 (H) 4.8 - 5.6 % Final    Comment:    (NOTE) Diagnosis of Diabetes The following HbA1c ranges recommended by the American Diabetes Association (ADA) may be used as an aid in the diagnosis of diabetes mellitus.  Hemoglobin             Suggested A1C NGSP%              Diagnosis  <5.7                   Non Diabetic  5.7-6.4                Pre-Diabetic  >6.4                   Diabetic  <7.0                   Glycemic control for                       adults with diabetes.      CBG: Recent Labs  Lab 09/13/24 1531 09/13/24 1953 09/13/24 2341 09/14/24 0342 09/14/24 0742  GLUCAP 193* 187* 208* 253* 199*   The patient is critically ill due to shock, cardiac arrest, encephalopathy respiratory failure.  Critical care was necessary to treat or prevent imminent or life-threatening deterioration.  Critical care was time spent personally by me on the following activities: development of treatment plan with patient and/or surrogate as well as nursing, discussions with consultants, evaluation of patient's response to treatment, examination of patient, obtaining history from patient or surrogate, ordering and performing treatments and interventions, ordering and review of laboratory studies, ordering  and review of radiographic studies, pulse oximetry, re-evaluation of patient's condition and participation in multidisciplinary rounds.   Critical care was time spent personally by me on the following activities: titration, monitoring, and management of vasopressor/ionotrope infusion and mechanical ventilation management and titration , development of treatment plan with patient and/or surrogate as well as nursing, discussions with consultants, evaluation of patient's response to treatment, examination of patient, obtaining history from patient or surrogate, ordering and performing treatments and interventions, ordering and review of laboratory studies, ordering and review of radiographic studies, pulse oximetry, re-evaluation of patient's condition and  participation in multidisciplinary rounds.  Critical Care Time devoted to patient care services described in this note is 32 minutes. This time reflects time of care of this signee Rajan Burgard S Handy Mcloud . This critical care time does not reflect separately billable procedures or procedure time, teaching time or supervisory time of PA/NP/Med student/Med Resident etc but could involve care discussion time.       Verdon GORMAN Meade Cloretta Pulmonary and Critical Care Medicine 09/14/2024 11:41 AM  Pager: see AMION  If no response to pager , please call critical care on call (see AMION) until 7pm After 7:00 pm call Elink

## 2024-09-15 ENCOUNTER — Inpatient Hospital Stay (HOSPITAL_COMMUNITY)

## 2024-09-15 LAB — CBC
HCT: 27.4 % — ABNORMAL LOW (ref 39.0–52.0)
HCT: 28.2 % — ABNORMAL LOW (ref 39.0–52.0)
HCT: 27.3 % — ABNORMAL LOW (ref 39.0–52.0)
HCT: 28.1 % — ABNORMAL LOW (ref 39.0–52.0)
Hemoglobin: 9.1 g/dL — ABNORMAL LOW (ref 13.0–17.0)
Hemoglobin: 9.5 g/dL — ABNORMAL LOW (ref 13.0–17.0)
Hemoglobin: 9.2 g/dL — ABNORMAL LOW (ref 13.0–17.0)
Hemoglobin: 9.3 g/dL — ABNORMAL LOW (ref 13.0–17.0)
MCH: 34.1 pg — ABNORMAL HIGH (ref 26.0–34.0)
MCH: 34.8 pg — ABNORMAL HIGH (ref 26.0–34.0)
MCH: 34.7 pg — ABNORMAL HIGH (ref 26.0–34.0)
MCH: 34.2 pg — ABNORMAL HIGH (ref 26.0–34.0)
MCHC: 33.2 g/dL (ref 30.0–36.0)
MCHC: 33.7 g/dL (ref 30.0–36.0)
MCHC: 33.7 g/dL (ref 30.0–36.0)
MCHC: 33.1 g/dL (ref 30.0–36.0)
MCV: 102.6 fL — ABNORMAL HIGH (ref 80.0–100.0)
MCV: 103.3 fL — ABNORMAL HIGH (ref 80.0–100.0)
MCV: 103 fL — ABNORMAL HIGH (ref 80.0–100.0)
MCV: 103.3 fL — ABNORMAL HIGH (ref 80.0–100.0)
Platelets: 247 K/uL (ref 150–400)
Platelets: 276 K/uL (ref 150–400)
Platelets: 287 K/uL (ref 150–400)
Platelets: 308 K/uL (ref 150–400)
RBC: 2.67 MIL/uL — ABNORMAL LOW (ref 4.22–5.81)
RBC: 2.73 MIL/uL — ABNORMAL LOW (ref 4.22–5.81)
RBC: 2.65 MIL/uL — ABNORMAL LOW (ref 4.22–5.81)
RBC: 2.72 MIL/uL — ABNORMAL LOW (ref 4.22–5.81)
RDW: 13.2 % (ref 11.5–15.5)
RDW: 13.3 % (ref 11.5–15.5)
RDW: 13.2 % (ref 11.5–15.5)
RDW: 13.3 % (ref 11.5–15.5)
WBC: 6.9 K/uL (ref 4.0–10.5)
WBC: 8 K/uL (ref 4.0–10.5)
WBC: 8.4 K/uL (ref 4.0–10.5)
WBC: 8.4 K/uL (ref 4.0–10.5)
nRBC: 0.3 % — ABNORMAL HIGH (ref 0.0–0.2)
nRBC: 0 % (ref 0.0–0.2)
nRBC: 0.2 % (ref 0.0–0.2)
nRBC: 0.2 % (ref 0.0–0.2)

## 2024-09-15 LAB — HEPATIC FUNCTION PANEL
ALT: 18 U/L (ref 0–44)
ALT: 18 U/L (ref 0–44)
ALT: 18 U/L (ref 0–44)
ALT: 18 U/L (ref 0–44)
ALT: 19 U/L (ref 0–44)
AST: 56 U/L — ABNORMAL HIGH (ref 15–41)
AST: 56 U/L — ABNORMAL HIGH (ref 15–41)
AST: 60 U/L — ABNORMAL HIGH (ref 15–41)
AST: 61 U/L — ABNORMAL HIGH (ref 15–41)
AST: 60 U/L — ABNORMAL HIGH (ref 15–41)
Albumin: 2.3 g/dL — ABNORMAL LOW (ref 3.5–5.0)
Albumin: 2.2 g/dL — ABNORMAL LOW (ref 3.5–5.0)
Albumin: 2.2 g/dL — ABNORMAL LOW (ref 3.5–5.0)
Albumin: 2.2 g/dL — ABNORMAL LOW (ref 3.5–5.0)
Albumin: 2.1 g/dL — ABNORMAL LOW (ref 3.5–5.0)
Alkaline Phosphatase: 65 U/L (ref 38–126)
Alkaline Phosphatase: 71 U/L (ref 38–126)
Alkaline Phosphatase: 74 U/L (ref 38–126)
Alkaline Phosphatase: 107 U/L (ref 38–126)
Alkaline Phosphatase: 109 U/L (ref 38–126)
Bilirubin, Direct: <0.1 mg/dL (ref 0.0–0.2)
Bilirubin, Direct: <0.1 mg/dL (ref 0.0–0.2)
Bilirubin, Direct: <0.1 mg/dL (ref 0.0–0.2)
Bilirubin, Direct: 0.1 mg/dL (ref 0.0–0.2)
Bilirubin, Direct: 0.1 mg/dL (ref 0.0–0.2)
Total Bilirubin: 0.4 mg/dL (ref 0.0–1.2)
Total Bilirubin: <0.2 mg/dL (ref 0.0–1.2)
Total Bilirubin: 0.3 mg/dL (ref 0.0–1.2)
Total Bilirubin: <0.2 mg/dL (ref 0.0–1.2)
Total Bilirubin: <0.2 mg/dL (ref 0.0–1.2)
Total Protein: 6.3 g/dL — ABNORMAL LOW (ref 6.5–8.1)
Total Protein: 6.1 g/dL — ABNORMAL LOW (ref 6.5–8.1)
Total Protein: 6.1 g/dL — ABNORMAL LOW (ref 6.5–8.1)
Total Protein: 6 g/dL — ABNORMAL LOW (ref 6.5–8.1)
Total Protein: 6.2 g/dL — ABNORMAL LOW (ref 6.5–8.1)

## 2024-09-15 LAB — POCT I-STAT 7, (LYTES, BLD GAS, ICA,H+H)
Acid-Base Excess: 0 mmol/L (ref 0.0–2.0)
Acid-Base Excess: 0 mmol/L (ref 0.0–2.0)
Acid-Base Excess: 1 mmol/L (ref 0.0–2.0)
Acid-Base Excess: 1 mmol/L (ref 0.0–2.0)
Acid-base deficit: 1 mmol/L (ref 0.0–2.0)
Acid-base deficit: 1 mmol/L (ref 0.0–2.0)
Acid-base deficit: 1 mmol/L (ref 0.0–2.0)
Acid-base deficit: 2 mmol/L (ref 0.0–2.0)
Bicarbonate: 22.4 mmol/L (ref 20.0–28.0)
Bicarbonate: 23.9 mmol/L (ref 20.0–28.0)
Bicarbonate: 24 mmol/L (ref 20.0–28.0)
Bicarbonate: 24.1 mmol/L (ref 20.0–28.0)
Bicarbonate: 24.7 mmol/L (ref 20.0–28.0)
Bicarbonate: 24.7 mmol/L (ref 20.0–28.0)
Bicarbonate: 25.1 mmol/L (ref 20.0–28.0)
Bicarbonate: 25.9 mmol/L (ref 20.0–28.0)
Calcium, Ion: 1.27 mmol/L (ref 1.15–1.40)
Calcium, Ion: 1.27 mmol/L (ref 1.15–1.40)
Calcium, Ion: 1.28 mmol/L (ref 1.15–1.40)
Calcium, Ion: 1.29 mmol/L (ref 1.15–1.40)
Calcium, Ion: 1.3 mmol/L (ref 1.15–1.40)
Calcium, Ion: 1.31 mmol/L (ref 1.15–1.40)
Calcium, Ion: 1.32 mmol/L (ref 1.15–1.40)
Calcium, Ion: 1.32 mmol/L (ref 1.15–1.40)
HCT: 25 % — ABNORMAL LOW (ref 39.0–52.0)
HCT: 26 % — ABNORMAL LOW (ref 39.0–52.0)
HCT: 26 % — ABNORMAL LOW (ref 39.0–52.0)
HCT: 26 % — ABNORMAL LOW (ref 39.0–52.0)
HCT: 26 % — ABNORMAL LOW (ref 39.0–52.0)
HCT: 27 % — ABNORMAL LOW (ref 39.0–52.0)
HCT: 27 % — ABNORMAL LOW (ref 39.0–52.0)
HCT: 30 % — ABNORMAL LOW (ref 39.0–52.0)
Hemoglobin: 10.2 g/dL — ABNORMAL LOW (ref 13.0–17.0)
Hemoglobin: 8.5 g/dL — ABNORMAL LOW (ref 13.0–17.0)
Hemoglobin: 8.8 g/dL — ABNORMAL LOW (ref 13.0–17.0)
Hemoglobin: 8.8 g/dL — ABNORMAL LOW (ref 13.0–17.0)
Hemoglobin: 8.8 g/dL — ABNORMAL LOW (ref 13.0–17.0)
Hemoglobin: 8.8 g/dL — ABNORMAL LOW (ref 13.0–17.0)
Hemoglobin: 9.2 g/dL — ABNORMAL LOW (ref 13.0–17.0)
Hemoglobin: 9.2 g/dL — ABNORMAL LOW (ref 13.0–17.0)
O2 Saturation: 100 %
O2 Saturation: 100 %
O2 Saturation: 100 %
O2 Saturation: 100 %
O2 Saturation: 100 %
O2 Saturation: 100 %
O2 Saturation: 98 %
O2 Saturation: 99 %
Patient temperature: 98.8
Patient temperature: 99
Patient temperature: 99.3
Patient temperature: 99.3
Patient temperature: 99.3
Patient temperature: 99.3
Potassium: 3.8 mmol/L (ref 3.5–5.1)
Potassium: 4 mmol/L (ref 3.5–5.1)
Potassium: 4 mmol/L (ref 3.5–5.1)
Potassium: 4.1 mmol/L (ref 3.5–5.1)
Potassium: 4.1 mmol/L (ref 3.5–5.1)
Potassium: 4.1 mmol/L (ref 3.5–5.1)
Potassium: 4.3 mmol/L (ref 3.5–5.1)
Potassium: 4.4 mmol/L (ref 3.5–5.1)
Sodium: 137 mmol/L (ref 135–145)
Sodium: 138 mmol/L (ref 135–145)
Sodium: 138 mmol/L (ref 135–145)
Sodium: 138 mmol/L (ref 135–145)
Sodium: 138 mmol/L (ref 135–145)
Sodium: 139 mmol/L (ref 135–145)
Sodium: 139 mmol/L (ref 135–145)
Sodium: 140 mmol/L (ref 135–145)
TCO2: 23 mmol/L (ref 22–32)
TCO2: 25 mmol/L (ref 22–32)
TCO2: 25 mmol/L (ref 22–32)
TCO2: 25 mmol/L (ref 22–32)
TCO2: 26 mmol/L (ref 22–32)
TCO2: 26 mmol/L (ref 22–32)
TCO2: 26 mmol/L (ref 22–32)
TCO2: 27 mmol/L (ref 22–32)
pCO2 arterial: 32.3 mmHg (ref 32–48)
pCO2 arterial: 36.6 mmHg (ref 32–48)
pCO2 arterial: 39 mmHg (ref 32–48)
pCO2 arterial: 39.4 mmHg (ref 32–48)
pCO2 arterial: 41.7 mmHg (ref 32–48)
pCO2 arterial: 42.9 mmHg (ref 32–48)
pCO2 arterial: 44.3 mmHg (ref 32–48)
pCO2 arterial: 44.8 mmHg (ref 32–48)
pH, Arterial: 7.34 — ABNORMAL LOW (ref 7.35–7.45)
pH, Arterial: 7.363 (ref 7.35–7.45)
pH, Arterial: 7.369 (ref 7.35–7.45)
pH, Arterial: 7.389 (ref 7.35–7.45)
pH, Arterial: 7.392 (ref 7.35–7.45)
pH, Arterial: 7.41 (ref 7.35–7.45)
pH, Arterial: 7.437 (ref 7.35–7.45)
pH, Arterial: 7.45 (ref 7.35–7.45)
pO2, Arterial: 116 mmHg — ABNORMAL HIGH (ref 83–108)
pO2, Arterial: 151 mmHg — ABNORMAL HIGH (ref 83–108)
pO2, Arterial: 267 mmHg — ABNORMAL HIGH (ref 83–108)
pO2, Arterial: 344 mmHg — ABNORMAL HIGH (ref 83–108)
pO2, Arterial: 464 mmHg — ABNORMAL HIGH (ref 83–108)
pO2, Arterial: 500 mmHg — ABNORMAL HIGH (ref 83–108)
pO2, Arterial: 505 mmHg — ABNORMAL HIGH (ref 83–108)
pO2, Arterial: 543 mmHg — ABNORMAL HIGH (ref 83–108)

## 2024-09-15 LAB — BASIC METABOLIC PANEL WITH GFR
Anion gap: 8 (ref 5–15)
Anion gap: 9 (ref 5–15)
Anion gap: 8 (ref 5–15)
Anion gap: 10 (ref 5–15)
Anion gap: 8 (ref 5–15)
BUN: 9 mg/dL (ref 6–20)
BUN: 8 mg/dL (ref 6–20)
BUN: 8 mg/dL (ref 6–20)
BUN: 10 mg/dL (ref 6–20)
BUN: 10 mg/dL (ref 6–20)
CO2: 23 mmol/L (ref 22–32)
CO2: 22 mmol/L (ref 22–32)
CO2: 23 mmol/L (ref 22–32)
CO2: 22 mmol/L (ref 22–32)
CO2: 23 mmol/L (ref 22–32)
Calcium: 9 mg/dL (ref 8.9–10.3)
Calcium: 9 mg/dL (ref 8.9–10.3)
Calcium: 9 mg/dL (ref 8.9–10.3)
Calcium: 9 mg/dL (ref 8.9–10.3)
Calcium: 9 mg/dL (ref 8.9–10.3)
Chloride: 107 mmol/L (ref 98–111)
Chloride: 107 mmol/L (ref 98–111)
Chloride: 106 mmol/L (ref 98–111)
Chloride: 106 mmol/L (ref 98–111)
Chloride: 106 mmol/L (ref 98–111)
Creatinine, Ser: 0.73 mg/dL (ref 0.61–1.24)
Creatinine, Ser: 0.72 mg/dL (ref 0.61–1.24)
Creatinine, Ser: 0.71 mg/dL (ref 0.61–1.24)
Creatinine, Ser: 0.64 mg/dL (ref 0.61–1.24)
Creatinine, Ser: 0.62 mg/dL (ref 0.61–1.24)
GFR, Estimated: >60 mL/min (ref >60)
GFR, Estimated: >60 mL/min (ref >60)
GFR, Estimated: >60 mL/min (ref >60)
GFR, Estimated: >60 mL/min (ref >60)
GFR, Estimated: >60 mL/min (ref >60)
Glucose, Bld: 179 mg/dL — ABNORMAL HIGH (ref 70–99)
Glucose, Bld: 156 mg/dL — ABNORMAL HIGH (ref 70–99)
Glucose, Bld: 199 mg/dL — ABNORMAL HIGH (ref 70–99)
Glucose, Bld: 162 mg/dL — ABNORMAL HIGH (ref 70–99)
Glucose, Bld: 152 mg/dL — ABNORMAL HIGH (ref 70–99)
Potassium: 4.1 mmol/L (ref 3.5–5.1)
Potassium: 4 mmol/L (ref 3.5–5.1)
Potassium: 3.9 mmol/L (ref 3.5–5.1)
Potassium: 4.3 mmol/L (ref 3.5–5.1)
Potassium: 4.3 mmol/L (ref 3.5–5.1)
Sodium: 138 mmol/L (ref 135–145)
Sodium: 138 mmol/L (ref 135–145)
Sodium: 137 mmol/L (ref 135–145)
Sodium: 138 mmol/L (ref 135–145)
Sodium: 137 mmol/L (ref 135–145)

## 2024-09-15 LAB — PHOSPHORUS
Phosphorus: 3.4 mg/dL (ref 2.5–4.6)
Phosphorus: 3.3 mg/dL (ref 2.5–4.6)
Phosphorus: 3 mg/dL (ref 2.5–4.6)
Phosphorus: 3.6 mg/dL (ref 2.5–4.6)
Phosphorus: 3.7 mg/dL (ref 2.5–4.6)

## 2024-09-15 LAB — URINALYSIS, ROUTINE W REFLEX MICROSCOPIC
Bacteria, UA: NONE SEEN
Bilirubin Urine: NEGATIVE
Glucose, UA: 50 mg/dL — AB
Hgb urine dipstick: NEGATIVE
Ketones, ur: NEGATIVE mg/dL
Nitrite: NEGATIVE
Protein, ur: NEGATIVE mg/dL
Specific Gravity, Urine: 1.011 (ref 1.005–1.030)
pH: 5 (ref 5.0–8.0)

## 2024-09-15 LAB — MAGNESIUM
Magnesium: 1.8 mg/dL (ref 1.7–2.4)
Magnesium: 1.7 mg/dL (ref 1.7–2.4)
Magnesium: 2.2 mg/dL (ref 1.7–2.4)
Magnesium: 1.9 mg/dL (ref 1.7–2.4)
Magnesium: 1.7 mg/dL (ref 1.7–2.4)

## 2024-09-15 LAB — PROTIME-INR
INR: 1 (ref 0.8–1.2)
INR: 1 (ref 0.8–1.2)
INR: 1 (ref 0.8–1.2)
INR: 1 (ref 0.8–1.2)
INR: 1.1 (ref 0.8–1.2)
Prothrombin Time: 13.8 s (ref 11.4–15.2)
Prothrombin Time: 14.3 s (ref 11.4–15.2)
Prothrombin Time: 14.1 s (ref 11.4–15.2)
Prothrombin Time: 13.8 s (ref 11.4–15.2)
Prothrombin Time: 14.4 s (ref 11.4–15.2)

## 2024-09-15 LAB — AMYLASE
Amylase: 36 U/L (ref 28–100)
Amylase: 40 U/L (ref 28–100)
Amylase: 47 U/L (ref 28–100)
Amylase: 49 U/L (ref 28–100)
Amylase: 49 U/L (ref 28–100)

## 2024-09-15 LAB — FIBRINOGEN
Fibrinogen: >800 mg/dL — ABNORMAL HIGH (ref 210–475)
Fibrinogen: 746 mg/dL — ABNORMAL HIGH (ref 210–475)
Fibrinogen: 808 mg/dL (ref 210–475)
Fibrinogen: 772 mg/dL — ABNORMAL HIGH (ref 210–475)

## 2024-09-15 LAB — HEPARIN LEVEL (UNFRACTIONATED): Heparin Unfractionated: 0.55 [IU]/mL (ref 0.30–0.70)

## 2024-09-15 LAB — APTT
aPTT: 99 s — ABNORMAL HIGH (ref 24–36)
aPTT: 87 s — ABNORMAL HIGH (ref 24–36)
aPTT: 61 s — ABNORMAL HIGH (ref 24–36)
aPTT: 33 s (ref 24–36)
aPTT: 37 s — ABNORMAL HIGH (ref 24–36)

## 2024-09-15 LAB — LIPASE, BLOOD
Lipase: 20 U/L (ref 11–51)
Lipase: 21 U/L (ref 11–51)
Lipase: 19 U/L (ref 11–51)
Lipase: 18 U/L (ref 11–51)
Lipase: 18 U/L (ref 11–51)

## 2024-09-15 LAB — GLUCOSE, CAPILLARY
Glucose-Capillary: 130 mg/dL — ABNORMAL HIGH (ref 70–99)
Glucose-Capillary: 140 mg/dL — ABNORMAL HIGH (ref 70–99)
Glucose-Capillary: 149 mg/dL — ABNORMAL HIGH (ref 70–99)
Glucose-Capillary: 157 mg/dL — ABNORMAL HIGH (ref 70–99)
Glucose-Capillary: 160 mg/dL — ABNORMAL HIGH (ref 70–99)
Glucose-Capillary: 183 mg/dL — ABNORMAL HIGH (ref 70–99)

## 2024-09-15 LAB — TRIGLYCERIDES: Triglycerides: 184 mg/dL — ABNORMAL HIGH (ref <150)

## 2024-09-15 MED ORDER — MAGNESIUM SULFATE 2 GM/50ML IV SOLN
2.0000 g | Freq: Once | INTRAVENOUS | Status: AC
Start: 2024-09-15 — End: 2024-09-15
  Administered 2024-09-15: 2 g via INTRAVENOUS
  Filled 2024-09-15: qty 50

## 2024-09-15 MED ORDER — THIAMINE MONONITRATE 100 MG PO TABS
100.0000 mg | ORAL_TABLET | Freq: Every day | ORAL | Status: DC
  Administered 2024-09-15 – 2024-09-16 (×2): 100 mg
  Filled 2024-09-15 (×2): qty 1

## 2024-09-15 MED ORDER — LEVETIRACETAM 500 MG PO TABS
1000.0000 mg | ORAL_TABLET | Freq: Two times a day (BID) | ORAL | Status: DC
  Administered 2024-09-15 – 2024-09-16 (×3): 1000 mg
  Filled 2024-09-15 (×3): qty 2

## 2024-09-15 NOTE — Progress Notes (Signed)
 NAME:  [name], MRN:  [id number], DOB:  30-Oct-1965, LOS: 7 ADMISSION DATE:  09/08/2024, CONSULTATION DATE:  09/08/2024 CHIEF COMPLAINT:  Cardiac arrest (OOH)  Summary  [name] is a 58 year old male with past medical history significant for multivessel CAD, HTN, HLD, type 2 DM who presented via EMS following cardiac arrest. Patient was found down in gas station bathroom for approx 33 minutes. ROSC achieved after 18 minutes of CPR, 1 Epi, 50 mcg Fentanyl , 500ml NS. Patient arrived via EMS intubated. In ED, patient received Narcan  with no improvement in neurological status, an arterial line was placed for hypotension, and patient was started on vasopressors. PCCM consulted for ICU evaluation post-cardiac arrest. Found patient to have frequent myoclonus, started propofol  and loaded with Keppra  and valproate.  Pertinent  Medical History   Past Medical History:  Diagnosis Date   Acid reflux    Concussion    Coronary artery disease    Diabetes (HCC)    ETOH abuse    High cholesterol    Hypertension    MI (myocardial infarction) (HCC)    2012   Sleep apnea    USES CPAP AS NEEDED   Tobacco abuse     Significant Hospital Events: Including procedures, antibiotic start and stop dates in addition to other pertinent events   11/20: Cardiac arrest, intubation, myoclonus observed, propofol  + Keppra  + valproate loaded. 11/21: Rhythmic bilateral foot movements observed. 11/22: Start propofol  wean while assessing for return of myoclonus. 11/23: Awaiting MRI. 11/24: MRI confirms HIE  Interim History / Subjective:  No overnight issues. RR controlled. He appears comfortable. Still on 2mcg norepinephrine  after additional sedation was started for tachypnea.   Objective    Blood pressure 138/81, pulse 60, temperature 99.3 F (37.4 C), resp. rate 16, height 5' 11 (1.803 m), weight 72.5 kg, SpO2 100%.    Vent Mode: PRVC FiO2 (%):  [35 %] 35 % Set Rate:  [13 bmp-16 bmp] 16 bmp Vt  Set:  [450 mL] 450 mL PEEP:  [5 cmH20] 5 cmH20 Plateau Pressure:  [15 cmH20-19 cmH20] 19 cmH20   Intake/Output Summary (Last 24 hours) at 09/15/2024 9081 Last data filed at 09/15/2024 0800 Gross per 24 hour  Intake 2242.83 ml  Output 2055 ml  Net 187.83 ml   Filed Weights   09/12/24 0500 09/12/24 1209 09/13/24 0500  Weight: 70.5 kg 72.9 kg 72.5 kg    Examination: Intubated, sedated, not responsive RRR No wheeze No peripheral edema   Assessment and Plan   # S/p cardiac arrest # Myoclonus with hypoxemic ischemic encephalopathy on MRI # Acute hypoxemic respiratory failure # Metabolic acidosis, non-gap #LV thrombus # Acute systolic heart failure (post-arrest) # Aspiration Pneumonia # alcohol intoxication on admission  Continue heparin  drip, ceftriaxone .  [facility] evaluation underway. Procurement scheduled Friday. Family has arrived from out of state but is not currently at the bedside. Continue current measures including lung protective ventilation, tube feeds.   Labs   CBC: Recent Labs  Lab 09/14/24 0444 09/14/24 9361 09/14/24 1051 09/14/24 1517 09/14/24 1642 09/14/24 2017 09/14/24 2310 09/15/24 0000 09/15/24 0353 09/15/24 0436 09/15/24 0812  WBC 7.3  --  8.6  --  7.5  --  6.5  --   --  6.9  --   HGB 9.5*   < > 9.3*   < > 9.3*   < > 9.2* 8.8* 10.2* 9.1* 8.8*  HCT 28.1*   < > 27.7*   < > 28.1*   < >  28.4* 26.0* 30.0* 27.4* 26.0*  MCV 100.4*  --  101.8*  --  103.3*  --  104.0*  --   --  102.6*  --   PLT 202  --  225  --  225  --  243  --   --  247  --    < > = values in this interval not displayed.    Basic Metabolic Panel: Recent Labs  Lab 09/14/24 0444 09/14/24 9361 09/14/24 1051 09/14/24 1517 09/14/24 1642 09/14/24 2017 09/14/24 2310 09/15/24 0000 09/15/24 0353 09/15/24 0436 09/15/24 0812  NA 132*   < > 134*   < > 136   < > 138 138 139 138 140  K 4.2   < > 4.2   < > 4.4   < > 4.1 4.1 4.1 4.0 4.0  CL 102  --  105  --  106  --  107  --   --   107  --   CO2 19*  --  20*  --  22  --  23  --   --  22  --   GLUCOSE 268*  --  250*  --  235*  --  179*  --   --  156*  --   BUN 14  --  11  --  11  --  9  --   --  8  --   CREATININE 0.88  --  0.83  --  0.79  --  0.73  --   --  0.72  --   CALCIUM  8.5*  --  8.6*  --  8.8*  --  9.0  --   --  9.0  --   MG 1.7  --  1.8  --  1.8  --  1.8  --   --  1.7  --   PHOS 3.1  --  3.7  --  3.7  --  3.4  --   --  3.3  --    < > = values in this interval not displayed.   GFR: Estimated Creatinine Clearance: 104.5 mL/min (by C-G formula based on SCr of 0.72 mg/dL). Recent Labs  Lab 09/08/24 1050 09/08/24 2107 09/09/24 0425 09/14/24 1051 09/14/24 1642 09/14/24 2310 09/15/24 0436  WBC  --   --    < > 8.6 7.5 6.5 6.9  LATICACIDVEN 10.5* 1.3  --   --   --   --   --    < > = values in this interval not displayed.    Liver Function Tests: Recent Labs  Lab 09/14/24 0444 09/14/24 1051 09/14/24 1642 09/14/24 2310 09/15/24 0436  AST 62* 64* 56* 56* 56*  ALT 18 19 19 18 18   ALKPHOS 69 69 69 65 71  BILITOT <0.2 0.4 0.3 0.4 <0.2  PROT 6.0* 6.2* 6.2* 6.3* 6.1*  ALBUMIN  2.3* 2.4* 2.3* 2.3* 2.2*   Recent Labs  Lab 09/14/24 0444 09/14/24 1051 09/14/24 1642 09/14/24 2310 09/15/24 0436  LIPASE 23 21 19 20 21   AMYLASE 44 41 33 36 40   No results for input(s): AMMONIA in the last 168 hours.  ABG    Component Value Date/Time   PHART 7.410 09/15/2024 0812   PCO2ART 39.0 09/15/2024 0812   PO2ART 267 (H) 09/15/2024 0812   HCO3 24.7 09/15/2024 0812   TCO2 26 09/15/2024 0812   ACIDBASEDEF 1.0 09/14/2024 2017   O2SAT 100 09/15/2024 0812     Coagulation Profile: Recent Labs  Lab  09/14/24 0444 09/14/24 1051 09/14/24 1642 09/14/24 2310 09/15/24 0436  INR 0.9 1.1 1.1 1.0 1.0    Cardiac Enzymes: Recent Labs  Lab 09/12/24 2300  CKTOTAL 143  CKMB 1.1    HbA1C: Hgb A1c MFr Bld  Date/Time Value Ref Range Status  09/12/2024 11:00 PM 7.8 (H) 4.8 - 5.6 % Final    Comment:     (NOTE)         Prediabetes: 5.7 - 6.4         Diabetes: >6.4         Glycemic control for adults with diabetes: <7.0   09/08/2024 01:43 PM 7.8 (H) 4.8 - 5.6 % Final    Comment:    (NOTE) Diagnosis of Diabetes The following HbA1c ranges recommended by the American Diabetes Association (ADA) may be used as an aid in the diagnosis of diabetes mellitus.  Hemoglobin             Suggested A1C NGSP%              Diagnosis  <5.7                   Non Diabetic  5.7-6.4                Pre-Diabetic  >6.4                   Diabetic  <7.0                   Glycemic control for                       adults with diabetes.      CBG: Recent Labs  Lab 09/14/24 1534 09/14/24 1940 09/14/24 2341 09/15/24 0348 09/15/24 0731  GLUCAP 214* 180* 178* 160* 157*   The patient is critically ill due to respiratory failure cardiac arrest.  Critical care was necessary to treat or prevent imminent or life-threatening deterioration.  Critical care was time spent personally by me on the following activities: development of treatment plan with patient and/or surrogate as well as nursing, discussions with consultants, evaluation of patient's response to treatment, examination of patient, obtaining history from patient or surrogate, ordering and performing treatments and interventions, ordering and review of laboratory studies, ordering and review of radiographic studies, pulse oximetry, re-evaluation of patient's condition and participation in multidisciplinary rounds.   Critical care was time spent personally by me on the following activities: mechanical ventilation management and titration, administration of IV electrolytes, and end of life planning conversations , development of treatment plan with patient and/or surrogate as well as nursing, discussions with consultants, evaluation of patient's response to treatment, examination of patient, obtaining history from patient or surrogate, ordering and performing  treatments and interventions, ordering and review of laboratory studies, ordering and review of radiographic studies, pulse oximetry, re-evaluation of patient's condition and participation in multidisciplinary rounds.  Critical Care Time devoted to patient care services described in this note is 32 minutes. This time reflects time of care of this signee Norberto Wishon S Maycee Blasco . This critical care time does not reflect separately billable procedures or procedure time, teaching time or supervisory time of PA/NP/Med student/Med Resident etc but could involve care discussion time.       Verdon GORMAN Meade Cloretta Pulmonary and Critical Care Medicine 09/15/2024 9:18 AM  Pager: see AMION  If no response to pager , please call critical care on call (see  AMION) until 7pm After 7:00 pm call Elink

## 2024-09-15 NOTE — Progress Notes (Signed)
 Attempted SBT this afternoon. All sedation held for 2 hours. I placed him on PS trial 5/5 and he was apneic with zero respiratory effort. Backup ventilation kicked in. Placed back on rate control.  Verdon Gore, MD Pulmonary and Critical Care Medicine [facility] 09/15/2024 3:02 PM Pager: see AMION  If no response to pager, please call critical care on call (see AMION) until 7pm After 7:00 pm call Elink

## 2024-09-15 NOTE — Progress Notes (Signed)
 Pharmacy Electrolyte Replacement  Recent Labs:  Recent Labs    09/15/24 0436 09/15/24 0812  K 4.0 4.0  MG 1.7  --   PHOS 3.3  --   CREATININE 0.72  --     Low Critical Values (K </= 2.5, Phos </= 1, Mg </= 1) Present: None  Plan: Give magnesium  sulfate 2g IV once.   Linnaea Ahn, PharmD

## 2024-09-16 ENCOUNTER — Inpatient Hospital Stay (HOSPITAL_COMMUNITY)

## 2024-09-16 ENCOUNTER — Encounter (HOSPITAL_COMMUNITY): Payer: Self-pay | Admitting: Certified Registered Nurse Anesthetist

## 2024-09-16 LAB — POCT I-STAT 7, (LYTES, BLD GAS, ICA,H+H)
Acid-Base Excess: 0 mmol/L (ref 0.0–2.0)
Acid-base deficit: 1 mmol/L (ref 0.0–2.0)
Acid-base deficit: 1 mmol/L (ref 0.0–2.0)
Bicarbonate: 24.6 mmol/L (ref 20.0–28.0)
Bicarbonate: 25.3 mmol/L (ref 20.0–28.0)
Bicarbonate: 25.6 mmol/L (ref 20.0–28.0)
Calcium, Ion: 1.33 mmol/L (ref 1.15–1.40)
Calcium, Ion: 1.35 mmol/L (ref 1.15–1.40)
Calcium, Ion: 1.36 mmol/L (ref 1.15–1.40)
HCT: 26 % — ABNORMAL LOW (ref 39.0–52.0)
HCT: 26 % — ABNORMAL LOW (ref 39.0–52.0)
HCT: 26 % — ABNORMAL LOW (ref 39.0–52.0)
Hemoglobin: 8.8 g/dL — ABNORMAL LOW (ref 13.0–17.0)
Hemoglobin: 8.8 g/dL — ABNORMAL LOW (ref 13.0–17.0)
Hemoglobin: 8.8 g/dL — ABNORMAL LOW (ref 13.0–17.0)
O2 Saturation: 99 %
O2 Saturation: 99 %
O2 Saturation: 99 %
Patient temperature: 97.9
Patient temperature: 36.5
Patient temperature: 97.7
Potassium: 4.2 mmol/L (ref 3.5–5.1)
Potassium: 4.3 mmol/L (ref 3.5–5.1)
Potassium: 4.4 mmol/L (ref 3.5–5.1)
Sodium: 137 mmol/L (ref 135–145)
Sodium: 137 mmol/L (ref 135–145)
Sodium: 137 mmol/L (ref 135–145)
TCO2: 26 mmol/L (ref 22–32)
TCO2: 27 mmol/L (ref 22–32)
TCO2: 27 mmol/L (ref 22–32)
pCO2 arterial: 43.1 mmHg (ref 32–48)
pCO2 arterial: 44.8 mmHg (ref 32–48)
pCO2 arterial: 46.2 mmHg (ref 32–48)
pH, Arterial: 7.363 (ref 7.35–7.45)
pH, Arterial: 7.348 — ABNORMAL LOW (ref 7.35–7.45)
pH, Arterial: 7.356 (ref 7.35–7.45)
pO2, Arterial: 145 mmHg — ABNORMAL HIGH (ref 83–108)
pO2, Arterial: 148 mmHg — ABNORMAL HIGH (ref 83–108)
pO2, Arterial: 149 mmHg — ABNORMAL HIGH (ref 83–108)

## 2024-09-16 LAB — HEPATIC FUNCTION PANEL
ALT: 17 U/L (ref 0–44)
ALT: 19 U/L (ref 0–44)
AST: 57 U/L — ABNORMAL HIGH (ref 15–41)
AST: 56 U/L — ABNORMAL HIGH (ref 15–41)
Albumin: 2.1 g/dL — ABNORMAL LOW (ref 3.5–5.0)
Albumin: 2.1 g/dL — ABNORMAL LOW (ref 3.5–5.0)
Alkaline Phosphatase: 115 U/L (ref 38–126)
Alkaline Phosphatase: 107 U/L (ref 38–126)
Bilirubin, Direct: <0.1 mg/dL (ref 0.0–0.2)
Bilirubin, Direct: 0.1 mg/dL (ref 0.0–0.2)
Indirect Bilirubin: 0.2 mg/dL — ABNORMAL LOW (ref 0.3–0.9)
Total Bilirubin: 0.2 mg/dL (ref 0.0–1.2)
Total Bilirubin: 0.3 mg/dL (ref 0.0–1.2)
Total Protein: 6 g/dL — ABNORMAL LOW (ref 6.5–8.1)
Total Protein: 6 g/dL — ABNORMAL LOW (ref 6.5–8.1)

## 2024-09-16 LAB — CBC
HCT: 27.4 % — ABNORMAL LOW (ref 39.0–52.0)
HCT: 27.4 % — ABNORMAL LOW (ref 39.0–52.0)
Hemoglobin: 9 g/dL — ABNORMAL LOW (ref 13.0–17.0)
Hemoglobin: 9.1 g/dL — ABNORMAL LOW (ref 13.0–17.0)
MCH: 34.1 pg — ABNORMAL HIGH (ref 26.0–34.0)
MCH: 34.6 pg — ABNORMAL HIGH (ref 26.0–34.0)
MCHC: 32.8 g/dL (ref 30.0–36.0)
MCHC: 33.2 g/dL (ref 30.0–36.0)
MCV: 103.8 fL — ABNORMAL HIGH (ref 80.0–100.0)
MCV: 104.2 fL — ABNORMAL HIGH (ref 80.0–100.0)
Platelets: 279 K/uL (ref 150–400)
Platelets: 304 K/uL (ref 150–400)
RBC: 2.64 MIL/uL — ABNORMAL LOW (ref 4.22–5.81)
RBC: 2.63 MIL/uL — ABNORMAL LOW (ref 4.22–5.81)
RDW: 13.2 % (ref 11.5–15.5)
RDW: 13.2 % (ref 11.5–15.5)
WBC: 7 K/uL (ref 4.0–10.5)
WBC: 8.8 K/uL (ref 4.0–10.5)
nRBC: 0 % (ref 0.0–0.2)
nRBC: 0 % (ref 0.0–0.2)

## 2024-09-16 LAB — AMYLASE
Amylase: 47 U/L (ref 28–100)
Amylase: 46 U/L (ref 28–100)

## 2024-09-16 LAB — URINALYSIS, ROUTINE W REFLEX MICROSCOPIC
Bacteria, UA: NONE SEEN
Bilirubin Urine: NEGATIVE
Glucose, UA: NEGATIVE mg/dL
Hgb urine dipstick: NEGATIVE
Ketones, ur: NEGATIVE mg/dL
Leukocytes,Ua: NEGATIVE
Nitrite: NEGATIVE
Protein, ur: NEGATIVE mg/dL
Specific Gravity, Urine: 1.024 (ref 1.005–1.030)
pH: 5 (ref 5.0–8.0)

## 2024-09-16 LAB — FIBRINOGEN
Fibrinogen: >800 mg/dL — ABNORMAL HIGH (ref 210–475)
Fibrinogen: >800 mg/dL — ABNORMAL HIGH (ref 210–475)
Fibrinogen: >800 mg/dL — ABNORMAL HIGH (ref 210–475)

## 2024-09-16 LAB — BASIC METABOLIC PANEL WITH GFR
Anion gap: 8 (ref 5–15)
Anion gap: 8 (ref 5–15)
BUN: 9 mg/dL (ref 6–20)
BUN: 9 mg/dL (ref 6–20)
CO2: 22 mmol/L (ref 22–32)
CO2: 23 mmol/L (ref 22–32)
Calcium: 9.2 mg/dL (ref 8.9–10.3)
Calcium: 9.3 mg/dL (ref 8.9–10.3)
Chloride: 107 mmol/L (ref 98–111)
Chloride: 106 mmol/L (ref 98–111)
Creatinine, Ser: 0.65 mg/dL (ref 0.61–1.24)
Creatinine, Ser: 0.66 mg/dL (ref 0.61–1.24)
GFR, Estimated: >60 mL/min (ref >60)
GFR, Estimated: >60 mL/min (ref >60)
Glucose, Bld: 141 mg/dL — ABNORMAL HIGH (ref 70–99)
Glucose, Bld: 146 mg/dL — ABNORMAL HIGH (ref 70–99)
Potassium: 4.2 mmol/L (ref 3.5–5.1)
Potassium: 4.3 mmol/L (ref 3.5–5.1)
Sodium: 137 mmol/L (ref 135–145)
Sodium: 137 mmol/L (ref 135–145)

## 2024-09-16 LAB — TYPE AND SCREEN
ABO/RH(D): A POS
Antibody Screen: NEGATIVE
PT AG Type: POSITIVE

## 2024-09-16 LAB — MAGNESIUM
Magnesium: 1.6 mg/dL — ABNORMAL LOW (ref 1.7–2.4)
Magnesium: 1.6 mg/dL — ABNORMAL LOW (ref 1.7–2.4)

## 2024-09-16 LAB — LIPASE, BLOOD
Lipase: 16 U/L (ref 11–51)
Lipase: 19 U/L (ref 11–51)

## 2024-09-16 LAB — PROTIME-INR
INR: 1 (ref 0.8–1.2)
INR: 1.1 (ref 0.8–1.2)
Prothrombin Time: 13.9 s (ref 11.4–15.2)
Prothrombin Time: 14.6 s (ref 11.4–15.2)

## 2024-09-16 LAB — HEPARIN LEVEL (UNFRACTIONATED)
Heparin Unfractionated: <0.1 [IU]/mL — ABNORMAL LOW (ref 0.30–0.70)
Heparin Unfractionated: <0.1 [IU]/mL — ABNORMAL LOW (ref 0.30–0.70)

## 2024-09-16 LAB — GLUCOSE, CAPILLARY
Glucose-Capillary: 148 mg/dL — ABNORMAL HIGH (ref 70–99)
Glucose-Capillary: 125 mg/dL — ABNORMAL HIGH (ref 70–99)
Glucose-Capillary: 119 mg/dL — ABNORMAL HIGH (ref 70–99)

## 2024-09-16 LAB — PHOSPHORUS
Phosphorus: 4.1 mg/dL (ref 2.5–4.6)
Phosphorus: 4.5 mg/dL (ref 2.5–4.6)

## 2024-09-16 LAB — RESP PANEL BY RT-PCR (RSV, FLU A&B, COVID)  RVPGX2
Influenza A by PCR: NEGATIVE
Influenza B by PCR: NEGATIVE
Resp Syncytial Virus by PCR: NEGATIVE
SARS Coronavirus 2 by RT PCR: NEGATIVE

## 2024-09-16 LAB — CULTURE, RESPIRATORY W GRAM STAIN: Culture: NORMAL

## 2024-09-16 LAB — APTT
aPTT: 35 s (ref 24–36)
aPTT: 35 s (ref 24–36)

## 2024-09-16 SURGERY — SURGICAL PROCUREMENT, ORGAN
Anesthesia: Choice

## 2024-09-16 MED ORDER — GLYCOPYRROLATE 0.2 MG/ML IJ SOLN: 0.2000 mg | INTRAMUSCULAR | Status: DC | PRN

## 2024-09-16 MED ORDER — SODIUM CHLORIDE 0.9 % IV SOLN: INTRAVENOUS | Status: DC

## 2024-09-16 MED ORDER — ACETAMINOPHEN 325 MG PO TABS: 650.0000 mg | ORAL_TABLET | Freq: Four times a day (QID) | ORAL | Status: DC | PRN

## 2024-09-16 MED ORDER — ONDANSETRON 4 MG PO TBDP: 4.0000 mg | ORAL_TABLET | Freq: Four times a day (QID) | ORAL | Status: DC | PRN

## 2024-09-16 MED ORDER — CEFAZOLIN SODIUM-DEXTROSE 1-4 GM/50ML-% IV SOLN
1.0000 g | Freq: Once | INTRAVENOUS | Status: DC
  Filled 2024-09-16: qty 50

## 2024-09-16 MED ORDER — GLYCOPYRROLATE 1 MG PO TABS: 1.0000 mg | ORAL_TABLET | ORAL | Status: DC | PRN

## 2024-09-16 MED ORDER — DIPHENHYDRAMINE HCL 50 MG/ML IJ SOLN: 25.0000 mg | INTRAMUSCULAR | Status: DC | PRN

## 2024-09-16 MED ORDER — ONDANSETRON HCL 4 MG/2ML IJ SOLN: 4.0000 mg | Freq: Four times a day (QID) | INTRAMUSCULAR | Status: DC | PRN

## 2024-09-16 MED ORDER — SODIUM CHLORIDE 0.9 % IV SOLN
1000.0000 mg | Freq: Once | INTRAVENOUS | Status: DC
  Filled 2024-09-16: qty 16

## 2024-09-16 MED ORDER — POLYVINYL ALCOHOL 1.4 % OP SOLN: 1.0000 [drp] | Freq: Four times a day (QID) | OPHTHALMIC | Status: DC | PRN

## 2024-09-16 MED ORDER — VANCOMYCIN HCL IN DEXTROSE 1-5 GM/200ML-% IV SOLN: 1000.0000 mg | Freq: Once | INTRAVENOUS | Status: DC

## 2024-09-16 MED ORDER — ACETAMINOPHEN 650 MG RE SUPP: 650.0000 mg | Freq: Four times a day (QID) | RECTAL | Status: DC | PRN

## 2024-09-16 MED ORDER — MORPHINE BOLUS VIA INFUSION
2.0000 mg | INTRAVENOUS | Status: DC | PRN
  Administered 2024-09-16: 2 mg via INTRAVENOUS

## 2024-09-18 LAB — CULTURE, BLOOD (ROUTINE X 2)
Culture: NO GROWTH
Culture: NO GROWTH
Special Requests: ADEQUATE
Special Requests: ADEQUATE

## 2024-09-19 NOTE — Progress Notes (Signed)
 PHARMACY - ANTICOAGULATION CONSULT NOTE  Pharmacy Consult for Heparin   Indication: Apical thrombus  No Known Allergies  Patient Measurements: Height: 5' 11 (180.3 cm) Weight: 74.4 kg (164 lb 0.4 oz) IBW/kg (Calculated) : 75.3  Vital Signs: Temp: 97.9 F (36.6 C) (11/28 0815) Temp Source: Esophageal (11/28 0800) Pulse Rate: 60 (11/28 0815)  Labs: Recent Labs    09/15/24 0436 09/15/24 9187 09/15/24 1649 09/15/24 1732 09/15/24 2309 09/15/24 2348  0444  0508  0753  1038  HGB 9.1*   < > 9.2*   < > 9.3*   < > 9.0* 8.8*  --  8.8*  HCT 27.4*   < > 27.3*   < > 28.1*   < > 27.4* 26.0*  --  26.0*  PLT 247   < > 287  --  308  --  279  --   --   --   APTT 87*   < > 33  --  37*  --  35  --   --   --   LABPROT 14.3   < > 13.8  --  14.4  --  13.9  --   --   --   INR 1.0   < > 1.0  --  1.1  --  1.0  --   --   --   HEPARINUNFRC 0.55  --   --   --   --   --  <0.10*  --  <0.10*  --   CREATININE 0.72   < > 0.64  --  0.62  --  0.65  --   --   --    < > = values in this interval not displayed.    Estimated Creatinine Clearance: 107.2 mL/min (by C-G formula based on SCr of 0.65 mg/dL).   Assessment: [name] is a 58 y.o. year old male admitted on 09/08/2024 with concern for apical thrombus. No anticoagulation prior to admission. Pharmacy consulted to dose heparin .  Heparin  level subtherapeutic <0.1 this AM. Repeat level also <0.1. Per RN lab drawn out of a-line appropriately. No overt s/sx of bleeding noted. No issues with infusion.   Goal of Therapy:  Heparin  level 0.3-0.7 units/ml Monitor platelets by anticoagulation protocol: Yes   Plan:  Increase heparin  infusion to 1200 units/hr Patient has plans to go for organ donation today.  Will get HL in 6 hours if donation plans are delayed.  Continue to monitor for bleeding.   Thank you for allowing pharmacy to participate in this patient's care.  Orpheus Hayhurst, PharmD

## 2024-09-19 NOTE — Progress Notes (Addendum)
 Family rescinded Donor Status and wanted comfort care > orders are placed.

## 2024-09-19 NOTE — Progress Notes (Signed)
 NAME:  [name], MRN:  [id number], DOB:  [birth year], LOS: 8 ADMISSION DATE:  09/08/2024, CONSULTATION DATE:  09/08/2024 CHIEF COMPLAINT:  Cardiac arrest (OOH)  Summary  [name] is a 59 year old male with past medical history significant for multivessel CAD, HTN, HLD, type 2 DM who presented via EMS following cardiac arrest. Patient was found down in gas station bathroom for approx 33 minutes. ROSC achieved after 18 minutes of CPR, 1 Epi, 50 mcg Fentanyl , 500ml NS. Patient arrived via EMS intubated. In ED, patient received Narcan  with no improvement in neurological status, an arterial line was placed for hypotension, and patient was started on vasopressors. PCCM consulted for ICU evaluation post-cardiac arrest. Found patient to have frequent myoclonus, started propofol  and loaded with Keppra  and valproate.  Pertinent  Medical History   Past Medical History:  Diagnosis Date   Acid reflux    Concussion    Coronary artery disease    Diabetes (HCC)    ETOH abuse    High cholesterol    Hypertension    MI (myocardial infarction) (HCC)    2012   Sleep apnea    USES CPAP AS NEEDED   Tobacco abuse     Significant Hospital Events: Including procedures, antibiotic start and stop dates in addition to other pertinent events   11/20: Cardiac arrest, intubation, myoclonus observed, propofol  + Keppra  + valproate loaded. 11/21: Rhythmic bilateral foot movements observed. 11/22: Start propofol  wean while assessing for return of myoclonus. 11/23: Awaiting MRI. 11/24: MRI confirms HIE  Interim History / Subjective:  No overnight issues. Appears comfortable on full vent support.   Objective    Blood pressure 138/81, pulse 60, temperature 97.9 F (36.6 C), resp. rate 15, height 5' 11 (1.803 m), weight 74.4 kg, SpO2 100%.    Vent Mode: PRVC FiO2 (%):  [35 %-100 %] 35 % Set Rate:  [15 bmp-16 bmp] 15 bmp Vt Set:  [450 mL] 450 mL PEEP:  [5 cmH20] 5 cmH20 Plateau Pressure:  [15  cmH20-19 cmH20] 17 cmH20   Intake/Output Summary (Last 24 hours) at  1051 Last data filed at  0800 Gross per 24 hour  Intake 1319.45 ml  Output 895 ml  Net 424.45 ml   Filed Weights   09/13/24 0500 09/15/24 0723  0500  Weight: 72.5 kg 73.1 kg 74.4 kg    Examination: Intubated, sedated Not responsive RRR No peripheral edema   Assessment and Plan   # S/p cardiac arrest # Myoclonus with hypoxemic ischemic encephalopathy on MRI # Acute hypoxemic respiratory failure # Metabolic acidosis, non-gap #LV thrombus # Acute systolic heart failure (post-arrest) # Aspiration Pneumonia # alcohol intoxication on admission  Continue current level of care. Plan is for [facility] donor procedure later today. He appears comfortable so will continue current level of sedation  Labs   CBC: Recent Labs  Lab 09/15/24 0436 09/15/24 0812 09/15/24 1116 09/15/24 1145 09/15/24 1649 09/15/24 1732 09/15/24 2309 09/15/24 2348  0444  0508  1038  WBC 6.9  --  8.0  --  8.4  --  8.4  --  7.0  --   --   HGB 9.1*   < > 9.5*   < > 9.2*   < > 9.3* 9.2* 9.0* 8.8* 8.8*  HCT 27.4*   < > 28.2*   < > 27.3*   < > 28.1* 27.0* 27.4* 26.0* 26.0*  MCV 102.6*  --  103.3*  --  103.0*  --  103.3*  --  103.8*  --   --   PLT 247  --  276  --  287  --  308  --  279  --   --    < > = values in this interval not displayed.    Basic Metabolic Panel: Recent Labs  Lab 09/15/24 0436 09/15/24 0812 09/15/24 1116 09/15/24 1145 09/15/24 1649 09/15/24 1732 09/15/24 2309 09/15/24 2348  0444  0508  1038  NA 138   < > 137   < > 138   < > 137 137 137 137 137  K 4.0   < > 3.9   < > 4.3   < > 4.3 4.4 4.2 4.2 4.3  CL 107  --  106  --  106  --  106  --  107  --   --   CO2 22  --  23  --  22  --  23  --  22  --   --   GLUCOSE 156*  --  199*  --  162*  --  152*  --  141*  --   --   BUN 8  --  8  --  10  --  10  --  9  --   --   CREATININE  0.72  --  0.71  --  0.64  --  0.62  --  0.65  --   --   CALCIUM  9.0  --  9.0  --  9.0  --  9.0  --  9.2  --   --   MG 1.7  --  2.2  --  1.9  --  1.7  --  1.6*  --   --   PHOS 3.3  --  3.0  --  3.6  --  3.7  --  4.1  --   --    < > = values in this interval not displayed.   GFR: Estimated Creatinine Clearance: 107.2 mL/min (by C-G formula based on SCr of 0.65 mg/dL). Recent Labs  Lab 09/15/24 1116 09/15/24 1649 09/15/24 2309  0444  WBC 8.0 8.4 8.4 7.0    Liver Function Tests: Recent Labs  Lab 09/15/24 0436 09/15/24 1116 09/15/24 1649 09/15/24 2309  0444  AST 56* 60* 61* 60* 57*  ALT 18 18 18 19 17   ALKPHOS 71 74 107 109 115  BILITOT <0.2 0.3 <0.2 <0.2 0.2  PROT 6.1* 6.1* 6.0* 6.2* 6.0*  ALBUMIN  2.2* 2.2* 2.2* 2.1* 2.1*   Recent Labs  Lab 09/15/24 0436 09/15/24 1116 09/15/24 1649 09/15/24 2309  0444  LIPASE 21 19 18 18 16   AMYLASE 40 47 49 49 47   No results for input(s): AMMONIA in the last 168 hours.  ABG    Component Value Date/Time   PHART 7.356  1038   PCO2ART 44.8  1038   PO2ART 149 (H)  1038   HCO3 25.3  1038   TCO2 27  1038   ACIDBASEDEF 1.0  1038   O2SAT 99  1038     Coagulation Profile: Recent Labs  Lab 09/15/24 0436 09/15/24 1116 09/15/24 1649 09/15/24 2309  0444  INR 1.0 1.0 1.0 1.1 1.0    Cardiac Enzymes: Recent Labs  Lab 09/12/24 2300  CKTOTAL 143  CKMB 1.1    HbA1C: Hgb A1c MFr Bld  Date/Time Value Ref Range Status  09/12/2024 11:00 PM 7.8 (H) 4.8 - 5.6 % Final    Comment:    (NOTE)  Prediabetes: 5.7 - 6.4         Diabetes: >6.4         Glycemic control for adults with diabetes: <7.0   09/08/2024 01:43 PM 7.8 (H) 4.8 - 5.6 % Final    Comment:    (NOTE) Diagnosis of Diabetes The following HbA1c ranges recommended by the American Diabetes Association (ADA) may be used as an aid in the diagnosis of diabetes  mellitus.  Hemoglobin             Suggested A1C NGSP%              Diagnosis  <5.7                   Non Diabetic  5.7-6.4                Pre-Diabetic  >6.4                   Diabetic  <7.0                   Glycemic control for                       adults with diabetes.      CBG: Recent Labs  Lab 09/15/24 1511 09/15/24 1930 09/15/24 2331  0336  0748  GLUCAP 149* 130* 140* 148* 125*   The patient is critically ill due to cardiac arrest, hypoxemic encephalopathy.  Critical care was necessary to treat or prevent imminent or life-threatening deterioration.  Critical care was time spent personally by me on the following activities: development of treatment plan with patient and/or surrogate as well as nursing, discussions with consultants, evaluation of patient's response to treatment, examination of patient, obtaining history from patient or surrogate, ordering and performing treatments and interventions, ordering and review of laboratory studies, ordering and review of radiographic studies, pulse oximetry, re-evaluation of patient's condition and participation in multidisciplinary rounds.   Critical care was time spent personally by me on the following activities: mechanical ventilation management and titration and end of life planning conversations , development of treatment plan with patient and/or surrogate as well as nursing, discussions with consultants, evaluation of patient's response to treatment, examination of patient, obtaining history from patient or surrogate, ordering and performing treatments and interventions, ordering and review of laboratory studies, ordering and review of radiographic studies, pulse oximetry, re-evaluation of patient's condition and participation in multidisciplinary rounds.  Critical Care Time devoted to patient care services described in this note is 31 minutes. This time reflects time of care of this signee Thorne Wirz S Helane Briceno . This  critical care time does not reflect separately billable procedures or procedure time, teaching time or supervisory time of PA/NP/Med student/Med Resident etc but could involve care discussion time.       Verdon GORMAN [facility]  12:13 PM  Pager: see AMION  If no response to pager , please call critical care on call (see AMION) until 7pm After 7:00 pm call Elink

## 2024-09-19 NOTE — Progress Notes (Signed)
 eLink Physician-Brief Progress Note Patient Name: [name] DOB: [birth year] MRN: [id number]   Date of Service    HPI/Events of Note  [facility] is requesting Q6 ABG's as well as a COVID test   eICU Interventions  Order placed     Intervention Category Major Interventions: Respiratory failure - evaluation and management  Tarig Zimmers G Nysha Koplin , 2:38 AM

## 2024-09-19 NOTE — Procedures (Signed)
 Extubation Procedure Note  Patient Details:   Name: [name] Scali DOB: Nov 19, 1965 MRN: [id number]   Airway Documentation:    Vent end date:  Vent end time: 1540   Evaluation  O2 sats: stable throughout Complications: No apparent complications Patient did tolerate procedure well. Bilateral Breath Sounds: Clear, Diminished   No, pt could not speak post extubation.  Pt extubated to room air per physician's order and in accordance with the family's wishes.  Family members at bedside for extubation.  Ozell KATHEE Blase , 3:48 PM

## 2024-09-19 NOTE — Death Summary Note (Signed)
 DEATH SUMMARY   Patient Details  Name: [name] MRN: [id number] DOB: [birth year]  Admission/Discharge Information   Admit Date:  Sep 14, 2024  Date of Death: Date of Death: 09-22-24  Time of Death: Time of Death: 1600  Length of Stay: 8  Referring Physician: Salman, Faiza, MD   Reason(s) for Hospitalization  Cardiac arrest  Diagnoses  Preliminary cause of death: Cardiac arrest secondary to CAD from DM2   Secondary Diagnoses (including complications and co-morbidities):  Principal Problem:   Cardiac arrest [facility]) Active Problems:   Goals of care, counseling/discussion   Seizure (HCC)   Acute hypoxemic respiratory failure (HCC)   On mechanically assisted ventilation (HCC)   Cardiogenic shock (HCC)   LV (left ventricular) mural thrombus   Alcohol abuse with intoxication HTN HLD   Brief Hospital Course (including significant findings, care, treatment, and services provided and events leading to death)  [name] was a 58 year old male with past medical history significant for multivessel CAD, HTN, HLD, type 2 DM who presented via EMS following cardiac arrest. Patient was found down in gas station bathroom for approx 33 minutes. ROSC achieved after 18 minutes of CPR, 1 Epi, 50 mcg Fentanyl , 500ml NS. Patient arrived via EMS intubated. In ED, patient received Narcan  with no improvement in neurological status, an arterial line was placed for hypotension, and patient was started on vasopressors. PCCM consulted for ICU evaluation post-cardiac arrest. Found patient to have frequent myoclonus, started propofol  and loaded with Keppra  and valproate. He was admitted to the ICU for targeted temperature management. He did not have any improvement in neurologic function. An MRI confirmed hypoxemic ischemic injury. His family - a cousin and uncle - were contacted. He was made DNR. There were initial conversations about organ donation however family rescinded organ donation and elected  to proceed with comfort care. He passed away with family at bedside.   Pertinent Labs and Studies  Significant Diagnostic Studies DG CHEST PORT 1 VIEW Result Date: 2024/09/22 CLINICAL DATA:  Organ donor. EXAM: PORTABLE CHEST 1 VIEW COMPARISON:  09/15/2024 and older exams. FINDINGS: Stable changes from prior CABG surgery. Cardiac silhouette is normal in size. No mediastinal or hilar masses. Endotracheal tube tip lies at the level of the posterior first ribs. Nasogastric tube passes below the diaphragm into the stomach. Left internal jugular central venous catheter tip projects in the mid superior vena cava. Support apparatus is stable. Clear lungs.  No pleural effusion or pneumothorax. Old healed left rib fractures.  No acute skeletal abnormality. IMPRESSION: 1. No acute cardiopulmonary disease. Stable appearance from the previous day's exam. Electronically Signed   By: Alm Parkins M.D.   On: 09-22-2024 11:35   DG CHEST PORT 1 VIEW Result Date: 09/15/2024 CLINICAL DATA:  Respiratory failure, cardiac arrest EXAM: PORTABLE CHEST 1 VIEW COMPARISON:  09/14/2024 FINDINGS: Single frontal view of the chest was obtained. The endotracheal tube, if still present, is not identified. Enteric catheter passes below diaphragm, tip excluded by collimation and side port project over the gastric fundus. Left internal jugular central venous catheter tip overlies superior vena cava. The cardiac silhouette is stable, with postsurgical changes from prior CABG. Persistent retrocardiac consolidation which may reflect atelectasis or airspace disease. No effusion or pneumothorax. IMPRESSION: 1. The endotracheal tube is no longer visualized, please correlate with history of extubation. 2. Remaining support devices as above. 3. Persistent retrocardiac consolidation which may reflect atelectasis or airspace disease. Electronically Signed   By: Ozell Daring M.D.   On:  09/15/2024 17:18   DG CHEST PORT 1 VIEW Result Date:  09/14/2024 EXAM: 1 VIEW(S) XRAY OF THE CHEST 09/14/2024 05:26:58 AM COMPARISON: 09/13/2024 CLINICAL HISTORY: Aspiration into airway FINDINGS: LINES, TUBES AND DEVICES: Left IJ CVC in place with tip in mid SVC. Enteric tube in place with tip and side port in stomach. Endotracheal tube tip is at the thoracic inlet. LUNGS AND PLEURA: Left basilar opacity. Possible small left pleural effusion. No pneumothorax. HEART AND MEDIASTINUM: Prior CABG noted. No acute abnormality of the cardiac and mediastinal silhouettes. BONES AND SOFT TISSUES: Chronic left posterior fourth rib deformity. IMPRESSION: 1. Left basilar airspace opacity and possible small left pleural effusion, which could reflect aspiration pneumonia or atelectasis. Electronically signed by: Katheleen Faes MD 09/14/2024 09:49 AM EST RP Workstation: [id number]   US  Abdomen Complete Result Date: 09/13/2024 EXAM: COMPLETE ABDOMINAL ULTRASOUND TECHNIQUE: Real-time ultrasonography of the abdomen was performed. COMPARISON: US  Abdomen 04/04/2020. CLINICAL HISTORY: Organ donation. FINDINGS: LIVER: Hepatic echogenicity and echotexture favor steatosis. No intrahepatic biliary ductal dilatation. No mass. BILIARY SYSTEM: Gallbladder wall thickness measures 2.6 mm. Gallbladder sludge is observed, cannot exclude tiny (2 mm) gallbladder polyp. No cholelithiasis. Common bile duct is within normal limits measuring 7.3 mm. KIDNEYS: Right kidney measures 12.3 cm in length. Left kidney measures 12.6 cm in length. Normal contour of kidneys. Normal cortical echogenicity. 0.7 cm benign appearing right renal cyst. 1.4 cm benign appearing left renal cyst. Mild left mid kidney scarring. No hydronephrosis. No calculus. No mass. PANCREAS: Pancreatic calcifications favoring chronic calcific pancreatitis. SPLEEN: Spleen length measures 10.5 cm. No acute abnormality. VESSELS: Visualized portion of the aorta is normal. Visualized portion of the inferior vena cava is normal. OTHER: No  ascites. IMPRESSION: 1. Hepatic echogenicity and echotexture favor steatosis. 2. Gallbladder sludge with a possible tiny (2 mm) gallbladder polyp. 3. Pancreatic calcifications favor chronic calcific pancreatitis. 4. Mild left mid kidney scarring. Electronically signed by: Ryan Salvage MD 09/13/2024 03:01 PM EST RP Workstation: [id number]   DG CHEST PORT 1 VIEW Result Date: 09/13/2024 EXAM: 1 VIEW(S) XRAY OF THE CHEST 09/13/2024 01:16:00 PM COMPARISON: 09/08/2024 CLINICAL HISTORY: Organ donation [id number] FINDINGS: LIMITATIONS/ARTIFACTS: The frontal projection is rotated to the left, reducing diagnostic sensitivity and specificity. LINES, TUBES AND DEVICES: Tubing at the upper margin of today's image may reflect an endotracheal tube at the thoracic inlet, with borderline high positioning. A left internal jugular central venous catheter is in place with its tip overlying the superior vena cava. An enteric tube is in place with its tip and side port overlying the stomach. LUNGS AND PLEURA: No focal pulmonary opacity. No pleural effusion. No pneumothorax. HEART AND MEDIASTINUM: No acute abnormality of the cardiac and mediastinal silhouettes. Prior CABG. BONES AND SOFT TISSUES: No acute osseous abnormality. Chronic left posterior rib deformities. IMPRESSION: 1. Frontal projection rotated to the left, reducing diagnostic sensitivity and specificity. 2. Possible endotracheal tube at the thoracic inlet with borderline high positioning. Consider repositioning to ensure appropriate placement. 3. Left internal jugular central venous catheter with tip overlying the superior vena cava. 4. Enteric tube with tip and side port overlying the stomach. 5. Prior CABG. 6. Chronic left posterior rib deformities. Electronically signed by: Ryan Salvage MD 09/13/2024 02:55 PM EST RP Workstation: [id number]   ECHOCARDIOGRAM COMPLETE Result Date: 09/13/2024    ECHOCARDIOGRAM REPORT   Patient Name:   Emeka CURTIS Bogart Date of  Exam: 09/13/2024 Medical Rec #:  [id number]            Height:  71.0 in Accession #:    [id number]           Weight:       159.8 lb Date of Birth:  1965-11-25             BSA:          1.917 m Patient Age:    58 years             BP:           117/68 mmHg Patient Gender: M                    HR:           67 bpm. Exam Location:  Inpatient Procedure: 2D Echo, Cardiac Doppler, Color Doppler, Intracardiac Opacification            Agent and 3D Echo (Both Spectral and Color Flow Doppler were utilized            during procedure). Indications:    Organ donation.  History:        Patient has prior history of Echocardiogram examinations, most                 recent 09/08/2024. CHF, CAD, Abnormal ECG and Prior CABG,                 Arrythmias:Cardiac Arrest, Signs/Symptoms:Chest Pain; Risk                 Factors:Diabetes and Dyslipidemia. Cardiac shock. ETOH.  Sonographer:    Ellouise Mose RDCS Referring Phys: [id number] JESSICA MARSHALL  Sonographer Comments: Echo performed with patient supine and on artificial respirator. IMPRESSIONS  1. Apical sludge with probable early thrombus formation. Left ventricular ejection fraction, by estimation, is 20 to 25%. Left ventricular ejection fraction by 3D volume is 21 %. The left ventricle has severely decreased function. The left ventricle demonstrates regional wall motion abnormalities (see scoring diagram/findings for description). Left ventricular diastolic parameters are consistent with Grade I diastolic dysfunction (impaired relaxation).  2. Right ventricular systolic function is mildly reduced. The right ventricular size is moderately enlarged. Tricuspid regurgitation signal is inadequate for assessing PA pressure.  3. The mitral valve is degenerative. Mild mitral valve regurgitation. No evidence of mitral stenosis.  4. The aortic valve is tricuspid. Aortic valve regurgitation is not visualized. No aortic stenosis is present. Comparison(s): No significant change from prior study.  FINDINGS  Left Ventricle: Apical sludge with probable early thrombus formation. Left ventricular ejection fraction, by estimation, is 20 to 25%. Left ventricular ejection fraction by 3D volume is 21 %. The left ventricle has severely decreased function. The left ventricle demonstrates regional wall motion abnormalities. Definity  contrast agent was given IV to delineate the left ventricular endocardial borders. The left ventricular internal cavity size was normal in size. There is no left ventricular hypertrophy.  Left ventricular diastolic parameters are consistent with Grade I diastolic dysfunction (impaired relaxation).  LV Wall Scoring: The apical inferior segment and apex are akinetic. Right Ventricle: The right ventricular size is moderately enlarged. No increase in right ventricular wall thickness. Right ventricular systolic function is mildly reduced. Tricuspid regurgitation signal is inadequate for assessing PA pressure. Left Atrium: Left atrial size was normal in size. Right Atrium: Right atrial size was normal in size. Pericardium: There is no evidence of pericardial effusion. Mitral Valve: The mitral valve is degenerative in appearance. Mild mitral valve regurgitation. No evidence of mitral valve stenosis. Tricuspid Valve: The tricuspid  valve is grossly normal. Tricuspid valve regurgitation is trivial. No evidence of tricuspid stenosis. Aortic Valve: The aortic valve is tricuspid. Aortic valve regurgitation is not visualized. No aortic stenosis is present. Pulmonic Valve: The pulmonic valve was not well visualized. Pulmonic valve regurgitation is not visualized. No evidence of pulmonic stenosis. Aorta: The aortic root is normal in size and structure and the ascending aorta was not well visualized. Venous: IVC assessment for right atrial pressure unable to be performed due to mechanical ventilation. IAS/Shunts: No atrial level shunt detected by color flow Doppler. Additional Comments: 3D was performed not  requiring image post processing on an independent workstation and was abnormal.  LEFT VENTRICLE PLAX 2D LVIDd:         5.10 cm         Diastology LVIDs:         4.30 cm         LV e' medial:    5.06 cm/s LV PW:         1.20 cm         LV E/e' medial:  11.4 LV IVS:        1.00 cm         LV e' lateral:   3.59 cm/s LVOT diam:     2.10 cm         LV E/e' lateral: 16.0 LV SV:         44 LV SV Index:   23 LVOT Area:     3.46 cm        3D Volume EF                                LV 3D EF:    Left                                             ventricul LV Volumes (MOD)                            ar LV vol d, MOD    183.0 ml                   ejection A2C:                                        fraction LV vol d, MOD    177.0 ml                   by 3D A4C:                                        volume is LV vol s, MOD    152.0 ml                   21 %. A2C: LV vol s, MOD    134.0 ml A4C:                           3D Volume EF: LV SV MOD A2C:  31.0 ml       3D EF:        21 % LV SV MOD A4C:   177.0 ml      LV EDV:       245 ml LV SV MOD BP:    42.1 ml       LV ESV:       193 ml                                LV SV:        52 ml RIGHT VENTRICLE            IVC RV S prime:     5.98 cm/s  IVC diam: 2.20 cm TAPSE (M-mode): 0.8 cm LEFT ATRIUM             Index        RIGHT ATRIUM           Index LA diam:        3.60 cm 1.88 cm/m   RA Area:     15.40 cm LA Vol (A2C):   47.2 ml 24.62 ml/m  RA Volume:   40.60 ml  21.18 ml/m LA Vol (A4C):   20.4 ml 10.64 ml/m LA Biplane Vol: 31.8 ml 16.59 ml/m  AORTIC VALVE LVOT Vmax:   78.10 cm/s LVOT Vmean:  49.300 cm/s LVOT VTI:    0.128 m  AORTA Ao Root diam: 3.20 cm MITRAL VALVE MV Area (PHT): 3.60 cm    SHUNTS MV Decel Time: 211 msec    Systemic VTI:  0.13 m MV E velocity: 57.60 cm/s  Systemic Diam: 2.10 cm MV A velocity: 94.30 cm/s MV E/A ratio:  0.61 Darryle Decent MD Electronically signed by Darryle Decent MD Signature Date/Time: 09/13/2024/10:59:48 AM    Final    CT CHEST ABDOMEN  PELVIS W CONTRAST Result Date: 09/13/2024 EXAM: CT CHEST, ABDOMEN AND PELVIS WITH CONTRAST 09/13/2024 12:26:44 AM TECHNIQUE: CT of the chest, abdomen and pelvis was performed with the administration of intravenous contrast. Multiplanar reformatted images are provided for review. Automated exposure control, iterative reconstruction, and/or weight based adjustment of the mA/kV was utilized to reduce the radiation dose to as low as reasonably achievable. COMPARISON: 10/17/2023. CLINICAL HISTORY: organ donation FINDINGS: CHEST: MEDIASTINUM AND LYMPH NODES: Heart is borderline in size. Diffuse coronary artery disease, status post CABG. Endotracheal tube tip in the mid trachea. The central airways are clear. No mediastinal, hilar or axillary lymphadenopathy. LUNGS AND PLEURA: Bilateral lower lobe airspace opacities dependently, right greater than left. This could reflect atelectasis or infiltrates. Aspiration not excluded. No pleural effusion or pneumothorax. ABDOMEN AND PELVIS: LIVER: Diffuse low density throughout the liver is compatible with fatty infiltration. GALLBLADDER AND BILE DUCTS: Small amount of pericholecystic fluid. No biliary ductal dilatation. SPLEEN: No acute abnormality. PANCREAS: Calcifications throughout the pancreas are compatible with chronic pancreatitis. ADRENAL GLANDS: No acute abnormality. KIDNEYS, URETERS AND BLADDER: Small cysts in the kidneys bilaterally, the largest 1.5 cm in the mid pole of the left kidney. Area of cortical thinning/scarring in the mid pole of the left kidney. Per consensus, no follow-up is needed for simple Bosniak type 1 and 2 renal cysts, unless the patient has a malignancy history or risk factors. No stones in the kidneys or ureters. No hydronephrosis. No perinephric or periureteral stranding. Foley catheter in the bladder, which is decompressed. GI AND BOWEL: Stomach demonstrates no acute abnormality.  There is no bowel obstruction. REPRODUCTIVE ORGANS: No acute  abnormality. PERITONEUM AND RETROPERITONEUM: No ascites. No free air. VASCULATURE: Aortic atherosclerosis. ABDOMINAL AND PELVIS LYMPH NODES: No lymphadenopathy. BONES AND SOFT TISSUES: Acute fractures through the anterior left 4th through 7th ribs. Acute fractures through the anterior right 3rd through 7th ribs. No focal soft tissue abnormality. IMPRESSION: 1. Bilateral lower lobe airspace opacities, possibly atelectasis or infiltrates; aspiration is not excluded. 2. Fractures through the anterior left 4th through 7th ribs and anterior right 3rd through 7th ribs. 3. Small amount of pericholecystic fluid. 4. Calcifications throughout the pancreas compatible with chronic pancreatitis. 5. Fatty liver. Electronically signed by: Franky Crease MD 09/13/2024 12:38 AM EST RP Workstation: [id number]   MR BRAIN WO CONTRAST Result Date: 09/11/2024 EXAM: MRI BRAIN WITHOUT CONTRAST 09/11/2024 09:57:11 PM TECHNIQUE: Multiplanar multisequence MRI of the head/brain was performed without the administration of intravenous contrast. COMPARISON: MRI Head Without IV Contrast 07/30/2019. CLINICAL HISTORY: Anoxic brain damage, patient is 72 hours post cardiac arrest, request for neuroprognostication. FINDINGS: BRAIN AND VENTRICLES: Diffuse cortical and deep gray nuclei diffusion abnormality compatible with hypoxic ischemic injury. Multifocal hyperintense T2-weighted signal within the cerebral white matter, most commonly due to chronic small vessel disease. No acute infarct. No intracranial hemorrhage. No mass. No midline shift. No hydrocephalus. The sella is unremarkable. Normal flow voids. ORBITS: No acute abnormality. SINUSES AND MASTOIDS: No acute abnormality. BONES AND SOFT TISSUES: Normal marrow signal. No acute soft tissue abnormality. IMPRESSION: 1. Diffuse cortical and deep gray nuclei diffusion abnormality compatible with hypoxic ischemic injury. 2. Multifocal hyperintense T2-weighted signal within the cerebral white matter,  most commonly due to chronic small vessel disease. Electronically signed by: Franky Stanford MD 09/11/2024 10:13 PM EST RP Workstation: [id number]   Overnight EEG with video Result Date: 09/10/2024 Shelton Arlin KIDD, MD     09/11/2024 11:37 AM Patient Name: Jaysin Gayler MRN: [id number] Epilepsy Attending: Arlin KIDD Shelton Referring Physician/Provider: Khaliqdina, Salman, MD Duration: 09/09/2024 9175 to 09/10/2024 9175 Patient history: 58yo M sp cardiac arrest. EEG to evaluate for seizure Level of alertness: comatose AEDs during EEG study: Propofol , LEV Technical aspects: This EEG study was done with scalp electrodes positioned according to the 10-20 International system of electrode placement. Electrical activity was reviewed with band pass filter of 1-70Hz , sensitivity of 7 uV/mm, display speed of 67mm/sec with a 60Hz  notched filter applied as appropriate. EEG data were recorded continuously and digitally stored.  Video monitoring was available and reviewed as appropriate. Description: At the beginning of the study, EEG showed burst suppression pattern with burst of 3-5hz  theta-delta slowing lasting 2-3 seconds alternating with 10-15 seconds of generalized suppression. Gradually bursts abated and eeg showed continuous generalized background suppression. Hyperventilation and photic stimulation were not performed.   ABNORMALITY - Burst suppression, generalized IMPRESSION: This study is suggestive of profound diffuse encephalopathy. No seizures or epileptiform discharges were seen throughout the recording. Priyanka O Yadav   VAS US  LOWER EXTREMITY VENOUS (DVT) Result Date: 09/08/2024  Lower Venous DVT Study Patient Name:  Briley CURTIS Yearick  Date of Exam:   09/08/2024 Medical Rec #: [id number]             Accession #:    [id number] Date of Birth: July 21, 1966              Patient Gender: M Patient Age:   34 years Exam Location:  [facility] Procedure:      VAS US  LOWER EXTREMITY VENOUS (DVT) Referring  Phys: NORLEEN PAYNE --------------------------------------------------------------------------------  Indications: Edema, and Status post cardiac arrest with > 30 minutes of downtime and 18 minutes of CPR.  Limitations: Ventilation, involuntary patient movement. Comparison Study: Prior negative right LEV done 06/24/22 Performing Technologist: Alberta Lis RVS  Examination Guidelines: A complete evaluation includes B-mode imaging, spectral Doppler, color Doppler, and power Doppler as needed of all accessible portions of each vessel. Bilateral testing is considered an integral part of a complete examination. Limited examinations for reoccurring indications may be performed as noted. The reflux portion of the exam is performed with the patient in reverse Trendelenburg.  +---------+---------------+---------+-----------+----------+--------------+ RIGHT    CompressibilityPhasicitySpontaneityPropertiesThrombus Aging +---------+---------------+---------+-----------+----------+--------------+ CFV      Full           Yes      No                                  +---------+---------------+---------+-----------+----------+--------------+ SFJ      Full                                                        +---------+---------------+---------+-----------+----------+--------------+ FV Prox  Full                                                        +---------+---------------+---------+-----------+----------+--------------+ FV Mid   Full                                                        +---------+---------------+---------+-----------+----------+--------------+ FV DistalFull                                                        +---------+---------------+---------+-----------+----------+--------------+ PFV      Full                                                        +---------+---------------+---------+-----------+----------+--------------+ POP      Full           No        Yes                                 +---------+---------------+---------+-----------+----------+--------------+ PTV      Full                                                        +---------+---------------+---------+-----------+----------+--------------+ PERO     Full                                                        +---------+---------------+---------+-----------+----------+--------------+   +---------+---------------+---------+-----------+----------+--------------+  LEFT     CompressibilityPhasicitySpontaneityPropertiesThrombus Aging +---------+---------------+---------+-----------+----------+--------------+ CFV      Full           Yes      No                                  +---------+---------------+---------+-----------+----------+--------------+ SFJ      Full                                                        +---------+---------------+---------+-----------+----------+--------------+ FV Prox  Full                                                        +---------+---------------+---------+-----------+----------+--------------+ FV Mid   Full                                                        +---------+---------------+---------+-----------+----------+--------------+ FV DistalFull                                                        +---------+---------------+---------+-----------+----------+--------------+ PFV      Full                                                        +---------+---------------+---------+-----------+----------+--------------+ POP      Full           No       Yes                                 +---------+---------------+---------+-----------+----------+--------------+ PTV      Full                                                        +---------+---------------+---------+-----------+----------+--------------+ PERO     Full                                                         +---------+---------------+---------+-----------+----------+--------------+     Summary: RIGHT: - There is no evidence of deep vein thrombosis in the lower extremity.  - No cystic structure found in the popliteal fossa.  LEFT: - There is no evidence of deep vein thrombosis in the lower extremity.  -  No cystic structure found in the popliteal fossa.  *See table(s) above for measurements and observations. Electronically signed by Debby Robertson on 09/08/2024 at 8:01:55 PM.    Final    ECHOCARDIOGRAM COMPLETE Result Date: 09/08/2024    ECHOCARDIOGRAM REPORT   Patient Name:   Baruch CURTIS Blackie Date of Exam: 09/08/2024 Medical Rec #:  [id number]            Height:       71.0 in Accession #:    [id number]           Weight:       147.7 lb Date of Birth:  1966/10/07             BSA:          1.854 m Patient Age:    58 years             BP:           115/88 mmHg Patient Gender: M                    HR:           62 bpm. Exam Location:  Inpatient Procedure: 2D Echo, Cardiac Doppler, Color Doppler and Intracardiac            Opacification Agent (Both Spectral and Color Flow Doppler were            utilized during procedure). Indications:     I42.9 Cardiomyopathy (unspecified)  History:         Patient has prior history of Echocardiogram examinations, most                  recent 07/04/2022. CAD, Abnormal ECG and Prior CABG,                  Arrythmias:Cardiac Arrest, Signs/Symptoms:Chest Pain; Risk                  Factors:Hypertension, Current Smoker and Dyslipidemia. ETOH.  Sonographer:     Ellouise Mose RDCS Referring Phys:  [id number] ROBERT LOCKWOOD Diagnosing Phys: Soyla Merck MD  Sonographer Comments: Technically difficult study due to poor echo windows, echo performed with patient supine and on artificial respirator and suboptimal parasternal window. Patient slightly right decubitus on vent. IMPRESSIONS  1. LV apical sludge with probable early thrombus formation. Left ventricular ejection fraction, by estimation, is 25 to  30%. The left ventricle has severely decreased function. The left ventricle demonstrates regional wall motion abnormalities (see scoring diagram/findings for description). There is mild left ventricular hypertrophy. Left ventricular diastolic parameters are consistent with Grade I diastolic dysfunction (impaired relaxation).  2. Right ventricular systolic function is moderately reduced. The right ventricular size is normal. Tricuspid regurgitation signal is inadequate for assessing PA pressure.  3. The mitral valve is degenerative. Trivial mitral valve regurgitation. No evidence of mitral stenosis.  4. The aortic valve was not well visualized. Aortic valve regurgitation is not visualized. No aortic stenosis is present.  5. The inferior vena cava is dilated in size with <50% respiratory variability, suggesting right atrial pressure of 15 mmHg. FINDINGS  Left Ventricle: LV apical sludge with probable early thrombus formation. Left ventricular ejection fraction, by estimation, is 25 to 30%. The left ventricle has severely decreased function. The left ventricle demonstrates regional wall motion abnormalities. Definity  contrast agent was given IV to delineate the left ventricular endocardial borders. The left ventricular internal cavity size was normal in size. There is mild  left ventricular hypertrophy. Left ventricular diastolic parameters are  consistent with Grade I diastolic dysfunction (impaired relaxation).  LV Wall Scoring: The mid and distal lateral wall, mid and distal anterior septum, apical anterior segment, and apical inferior segment are akinetic. The anterior wall, antero-lateral wall, inferior wall, basal anteroseptal segment, basal inferolateral segment, mid inferoseptal segment, and basal inferoseptal segment are hypokinetic. Right Ventricle: The right ventricular size is normal. No increase in right ventricular wall thickness. Right ventricular systolic function is moderately reduced. Tricuspid  regurgitation signal is inadequate for assessing PA pressure. Left Atrium: Left atrial size was normal in size. Right Atrium: Right atrial size was normal in size. Pericardium: There is no evidence of pericardial effusion. Mitral Valve: The mitral valve is degenerative in appearance. Mild to moderate mitral annular calcification. Trivial mitral valve regurgitation. No evidence of mitral valve stenosis. Tricuspid Valve: The tricuspid valve is not well visualized. Tricuspid valve regurgitation is not demonstrated. No evidence of tricuspid stenosis. Aortic Valve: The aortic valve was not well visualized. Aortic valve regurgitation is not visualized. No aortic stenosis is present. Pulmonic Valve: The pulmonic valve was not well visualized. Pulmonic valve regurgitation is not visualized. No evidence of pulmonic stenosis. Aorta: The ascending aorta was not well visualized. Venous: The pulmonary veins were not well visualized. The inferior vena cava is dilated in size with less than 50% respiratory variability, suggesting right atrial pressure of 15 mmHg. IAS/Shunts: No atrial level shunt detected by color flow Doppler.  LEFT VENTRICLE PLAX 2D LVIDd:         4.85 cm     Diastology LVIDs:         4.40 cm     LV e' medial:    5.22 cm/s LV PW:         1.10 cm     LV E/e' medial:  8.8 LV IVS:        1.10 cm     LV e' lateral:   3.81 cm/s LVOT diam:     2.30 cm     LV E/e' lateral: 12.1 LV SV:         49 LV SV Index:   27 LVOT Area:     4.15 cm  LV Volumes (MOD) LV vol d, MOD A2C: 76.9 ml LV vol d, MOD A4C: 77.2 ml LV vol s, MOD A2C: 62.2 ml LV vol s, MOD A4C: 49.2 ml LV SV MOD A2C:     14.7 ml LV SV MOD A4C:     77.2 ml LV SV MOD BP:      23.6 ml IVC IVC diam: 2.10 cm LEFT ATRIUM             Index        RIGHT ATRIUM           Index LA diam:        2.30 cm 1.24 cm/m   RA Area:     10.80 cm LA Vol (A2C):   20.2 ml 10.90 ml/m  RA Volume:   22.40 ml  12.08 ml/m LA Vol (A4C):   26.0 ml 14.03 ml/m LA Biplane Vol: 23.9 ml  12.89 ml/m  AORTIC VALVE LVOT Vmax:   74.90 cm/s LVOT Vmean:  47.200 cm/s LVOT VTI:    0.119 m MITRAL VALVE MV Area (PHT): 2.80 cm    SHUNTS MV Decel Time: 271 msec    Systemic VTI:  0.12 m MV E velocity: 46.10 cm/s  Systemic Diam: 2.30 cm MV A  velocity: 58.20 cm/s MV E/A ratio:  0.79 Soyla Merck MD Electronically signed by Soyla Merck MD Signature Date/Time: 09/08/2024/3:47:08 PM    Final (Updated)    DG Abd 1 View Result Date: 09/08/2024 EXAM: 1 VIEW XRAY OF THE ABDOMEN 09/08/2024 03:16:00 PM COMPARISON: 07/02/2022 CLINICAL HISTORY: [id number] Encounter for central line care [phone]; [phone] Encounter for orogastric (OG) tube placement [id number] FINDINGS: LINES, TUBES AND DEVICES: Distal tip of the nasogastric tube seen in the expected position of stomach. BOWEL: Nonobstructive bowel gas pattern. SOFT TISSUES: No opaque urinary calculi. BONES: No acute osseous abnormality. IMPRESSION: 1. Distal tip of the nasogastric tube in the expected position of the stomach. Electronically signed by: Lynwood Seip MD 09/08/2024 03:29 PM EST RP Workstation: [id number]   DG CHEST PORT 1 VIEW Result Date: 09/08/2024 EXAM: 1 VIEW(S) XRAY OF THE CHEST 09/08/2024 03:16:00 PM COMPARISON: Comparison is made to a prior study from the same day. CLINICAL HISTORY: Encounter for central line care; Encounter for orogastric (OG) tube placement. FINDINGS: LINES, TUBES AND DEVICES: Left internal jugular catheter with distal tip in the expected position of the cavoatrial junction. Otherwise stable support apparatus. LUNGS AND PLEURA: No focal pulmonary opacity. No pleural effusion. No pneumothorax. HEART AND MEDIASTINUM: No acute abnormality of the cardiac and mediastinal silhouettes. BONES AND SOFT TISSUES: No acute osseous abnormality. IMPRESSION: 1. Left internal jugular catheter with distal tip at the cavoatrial junction, in expected position. Electronically signed by: Lynwood Seip MD 09/08/2024 03:28 PM EST RP Workstation: [id number]    CT HEAD WO CONTRAST Result Date: 09/08/2024 EXAM: CT HEAD WITHOUT CONTRAST 09/08/2024 12:17:08 PM TECHNIQUE: CT of the head was performed without the administration of intravenous contrast. Automated exposure control, iterative reconstruction, and/or weight based adjustment of the mA/kV was utilized to reduce the radiation dose to as low as reasonably achievable. COMPARISON: CT head 04/27/2015 CLINICAL HISTORY: Mental status change, unknown cause FINDINGS: BRAIN AND VENTRICLES: No acute hemorrhage. Equivocal subtle diffuse loss of gray-white differentiation without mass effect. No hydrocephalus. No extra-axial collection. No mass effect or midline shift. ORBITS: No acute abnormality. SINUSES: No acute abnormality. SOFT TISSUES AND SKULL: No skull fracture. IMPRESSION: 1. Equivocal subtle diffuse loss of gray-white differentiation without mass effect. An MRI could provide more sensitive evaluation for hypoxic/ischemic insult if clinically warranted. Electronically signed by: Gilmore Molt MD 09/08/2024 12:35 PM EST RP Workstation: [id number]   DG Chest Port 1 View Result Date: 09/08/2024 EXAM: 1 VIEW(S) XRAY OF THE CHEST 09/08/2024 10:53:00 AM COMPARISON: 07/21/2022. CLINICAL HISTORY: Cardiac arrest, OG tube placement, patient found down FINDINGS: LINES, TUBES AND DEVICES: Endotracheal tube in place with tip about 7 cm above the carina. Enteric tube in place terminating in the stomach, side hole at the level of the gastric body. Pacer or resuscitation pads over the chest. LUNGS AND PLEURA: Low lung volumes. No focal pulmonary opacity. No pleural effusion. No pneumothorax. HEART AND MEDIASTINUM: No acute abnormality of the cardiac and mediastinal silhouettes. BONES AND SOFT TISSUES: Sternotomy wires and CABG noted. Chronic left lateral rib fractures. Paucity of bowel gas. IMPRESSION: 1. Support devices in appropriate position. 2. Low lung volumes. 3. No acute cardiopulmonary findings. Electronically signed  by: Donnice Mania MD 09/08/2024 11:45 AM EST RP Workstation: [id number]    Microbiology Recent Results (from the past 240 hours)  Culture, Respiratory w Gram Stain     Status: None   Collection Time: 09/08/24 10:42 AM   Specimen: Tracheal Aspirate; Respiratory  Result Value Ref Range Status   Specimen Description TRACHEAL  ASPIRATE  Final   Special Requests NONE  Final   Gram Stain FEW WBC SEEN RARE GRAM NEGATIVE RODS   Final   Culture   Final    RARE Normal respiratory flora-no Staph aureus or Pseudomonas seen Performed at [facility], [address] [place], [place] [address]    Report Status 09/12/2024 FINAL  Final  MRSA Next Gen by PCR, Nasal     Status: None   Collection Time: 09/08/24 11:26 AM   Specimen: Nasal Mucosa; Nasal Swab  Result Value Ref Range Status   MRSA by PCR Next Gen NOT DETECTED NOT DETECTED Final    Comment: (NOTE) The GeneXpert MRSA Assay (FDA approved for NASAL specimens only), is one component of a comprehensive MRSA colonization surveillance program. It is not intended to diagnose MRSA infection nor to guide or monitor treatment for MRSA infections. Test performance is not FDA approved in patients less than 44 years old. Performed at [facility], [address] [place], [place] [address]   Resp panel by RT-PCR (RSV, Flu A&B, Covid) Anterior Nasal Swab     Status: None   Collection Time: 09/12/24 11:07 PM   Specimen: Anterior Nasal Swab  Result Value Ref Range Status   SARS Coronavirus 2 by RT PCR NEGATIVE NEGATIVE Final   Influenza A by PCR NEGATIVE NEGATIVE Final   Influenza B by PCR NEGATIVE NEGATIVE Final    Comment: (NOTE) The Xpert Xpress SARS-CoV-2/FLU/RSV plus assay is intended as an aid in the diagnosis of influenza from Nasopharyngeal swab specimens and should not be used as a sole basis for treatment. Nasal washings and aspirates are unacceptable for Xpert Xpress SARS-CoV-2/FLU/RSV testing.  Fact Sheet for  Patients: bloggercourse.com  Fact Sheet for Healthcare Providers: seriousbroker.it  This test is not yet approved or cleared by the [place]  FDA and has been authorized for detection and/or diagnosis of SARS-CoV-2 by FDA under an Emergency Use Authorization (EUA). This EUA will remain in effect (meaning this test can be used) for the duration of the COVID-19 declaration under Section 564(b)(1) of the Act, 21 U.S.C. section 360bbb-3(b)(1), unless the authorization is terminated or revoked.     Resp Syncytial Virus by PCR NEGATIVE NEGATIVE Final    Comment: (NOTE) Fact Sheet for Patients: bloggercourse.com  Fact Sheet for Healthcare Providers: seriousbroker.it  This test is not yet approved or cleared by the [place]  FDA and has been authorized for detection and/or diagnosis of SARS-CoV-2 by FDA under an Emergency Use Authorization (EUA). This EUA will remain in effect (meaning this test can be used) for the duration of the COVID-19 declaration under Section 564(b)(1) of the Act, 21 U.S.C. section 360bbb-3(b)(1), unless the authorization is terminated or revoked.  Performed at [facility], [address], [place], [place] [address]   Culture, blood (Routine X 2) w Reflex to ID Panel     Status: None (Preliminary result)   Collection Time: 09/13/24  1:14 AM   Specimen: BLOOD LEFT ARM  Result Value Ref Range Status   Specimen Description BLOOD LEFT ARM  Final   Special Requests   Final    BOTTLES DRAWN AEROBIC AND ANAEROBIC Blood Culture adequate volume   Culture   Final    NO GROWTH 3 DAYS Performed at [facility], [address] [place], [place] [address]    Report Status PENDING  Incomplete  Culture, blood (Routine X 2) w Reflex to ID Panel  Status: None (Preliminary result)   Collection Time: 09/13/24  1:18 AM   Specimen: BLOOD LEFT HAND   Result Value Ref Range Status   Specimen Description BLOOD LEFT HAND  Final   Special Requests   Final    BOTTLES DRAWN AEROBIC AND ANAEROBIC Blood Culture adequate volume   Culture   Final    NO GROWTH 3 DAYS Performed at [facility], [address], [place], [place] [address]    Report Status PENDING  Incomplete  Remove and replace urinary cath (placed > 5 days) then obtain urine culture from new indwelling urinary catheter.     Status: None   Collection Time: 09/13/24  1:11 PM   Specimen: Urine, Catheterized  Result Value Ref Range Status   Specimen Description URINE, CATHETERIZED  Final   Special Requests NONE  Final   Culture   Final    NO GROWTH Performed at [facility], [address] [place], [place] [address]    Report Status 09/14/2024 FINAL  Final  Culture, Respiratory w Gram Stain     Status: None   Collection Time: 09/13/24  2:51 PM   Specimen: Bronchoalveolar Lavage; Respiratory  Result Value Ref Range Status   Specimen Description BRONCHIAL ALVEOLAR LAVAGE  Final   Special Requests NONE  Final   Gram Stain   Final    FEW WBC PRESENT, PREDOMINANTLY PMN NO ORGANISMS SEEN    Culture   Final    RARE Normal respiratory flora-no Staph aureus or Pseudomonas seen Performed at [facility], [address], [place], [place] [address]    Report Status  FINAL  Final  Resp panel by RT-PCR (RSV, Flu A&B, Covid) Anterior Nasal Swab     Status: None   Collection Time:   3:14 AM   Specimen: Anterior Nasal Swab  Result Value Ref Range Status   SARS Coronavirus 2 by RT PCR NEGATIVE NEGATIVE Final   Influenza A by PCR NEGATIVE NEGATIVE Final   Influenza B by PCR NEGATIVE NEGATIVE Final    Comment: (NOTE) The Xpert Xpress SARS-CoV-2/FLU/RSV plus assay is intended as an aid in the diagnosis of influenza from Nasopharyngeal swab specimens and should not be used as a sole basis for treatment. Nasal washings and aspirates are unacceptable  for Xpert Xpress SARS-CoV-2/FLU/RSV testing.  Fact Sheet for Patients: bloggercourse.com  Fact Sheet for Healthcare Providers: seriousbroker.it  This test is not yet approved or cleared by the [place]  FDA and has been authorized for detection and/or diagnosis of SARS-CoV-2 by FDA under an Emergency Use Authorization (EUA). This EUA will remain in effect (meaning this test can be used) for the duration of the COVID-19 declaration under Section 564(b)(1) of the Act, 21 U.S.C. section 360bbb-3(b)(1), unless the authorization is terminated or revoked.     Resp Syncytial Virus by PCR NEGATIVE NEGATIVE Final    Comment: (NOTE) Fact Sheet for Patients: bloggercourse.com  Fact Sheet for Healthcare Providers: seriousbroker.it  This test is not yet approved or cleared by the [place]  FDA and has been authorized for detection and/or diagnosis of SARS-CoV-2 by FDA under an Emergency Use Authorization (EUA). This EUA will remain in effect (meaning this test can be used) for the duration of the COVID-19 declaration under Section 564(b)(1) of the Act, 21 U.S.C. section 360bbb-3(b)(1), unless the authorization is terminated or revoked.  Performed at [facility], [address], [place], [place] [address]     Lab Basic Metabolic  Panel: Recent Labs  Lab 09/15/24 1116 09/15/24 1145 09/15/24 1649 09/15/24 1732 09/15/24 2309 09/15/24 2348 10-16-2024 0444 10/16/2024 0508 Oct 16, 2024 1038 10/16/24 1051 2024-10-16 1102  NA 137   < > 138   < > 137   < > 137 137 137 137 137  K 3.9   < > 4.3   < > 4.3   < > 4.2 4.2 4.3 4.3 4.4  CL 106  --  106  --  106  --  107  --   --  106  --   CO2 23  --  22  --  23  --  22  --   --  23  --   GLUCOSE 199*  --  162*  --  152*  --  141*  --   --  146*  --   BUN 8  --  10  --  10  --  9  --   --  9  --   CREATININE 0.71  --  0.64  --  0.62  --   0.65  --   --  0.66  --   CALCIUM  9.0  --  9.0  --  9.0  --  9.2  --   --  9.3  --   MG 2.2  --  1.9  --  1.7  --  1.6*  --   --  1.6*  --   PHOS 3.0  --  3.6  --  3.7  --  4.1  --   --  4.5  --    < > = values in this interval not displayed.   Liver Function Tests: Recent Labs  Lab 09/15/24 1116 09/15/24 1649 09/15/24 2309 10/16/2024 0444 2024-10-16 1051  AST 60* 61* 60* 57* 56*  ALT 18 18 19 17 19   ALKPHOS 74 107 109 115 107  BILITOT 0.3 <0.2 <0.2 0.2 0.3  PROT 6.1* 6.0* 6.2* 6.0* 6.0*  ALBUMIN  2.2* 2.2* 2.1* 2.1* 2.1*   Recent Labs  Lab 09/15/24 1116 09/15/24 1649 09/15/24 2309 10-16-2024 0444 10-16-24 1051  LIPASE 19 18 18 16 19   AMYLASE 47 49 49 47 46   No results for input(s): AMMONIA in the last 168 hours. CBC: Recent Labs  Lab 09/15/24 1116 09/15/24 1145 09/15/24 1649 09/15/24 1732 09/15/24 2309 09/15/24 2348 10-16-2024 0444 10-16-24 0508 2024-10-16 1038 2024-10-16 1051 10/16/2024 1102  WBC 8.0  --  8.4  --  8.4  --  7.0  --   --  8.8  --   HGB 9.5*   < > 9.2*   < > 9.3*   < > 9.0* 8.8* 8.8* 9.1* 8.8*  HCT 28.2*   < > 27.3*   < > 28.1*   < > 27.4* 26.0* 26.0* 27.4* 26.0*  MCV 103.3*  --  103.0*  --  103.3*  --  103.8*  --   --  104.2*  --   PLT 276  --  287  --  308  --  279  --   --  304  --    < > = values in this interval not displayed.   Cardiac Enzymes: Recent Labs  Lab 09/12/24 2300  CKTOTAL 143  CKMB 1.1   Sepsis Labs: Recent Labs  Lab 09/15/24 1649 09/15/24 2309 10-16-24 0444 10/16/2024 1051  WBC 8.4 8.4 7.0 8.8

## 2024-09-19 DEATH — deceased
